# Patient Record
Sex: Male | Born: 1955
Health system: Southern US, Community
[De-identification: ages and names within clinical notes are randomized; demographics above are authoritative.]

## PROBLEM LIST (undated history)

## (undated) DIAGNOSIS — I10 Essential (primary) hypertension: Secondary | ICD-10-CM

## (undated) DIAGNOSIS — I2510 Atherosclerotic heart disease of native coronary artery without angina pectoris: Secondary | ICD-10-CM

## (undated) DIAGNOSIS — Z95810 Presence of automatic (implantable) cardiac defibrillator: Secondary | ICD-10-CM

## (undated) DIAGNOSIS — I428 Other cardiomyopathies: Secondary | ICD-10-CM

## (undated) DIAGNOSIS — I5022 Chronic systolic (congestive) heart failure: Principal | ICD-10-CM

## (undated) HISTORY — DX: Chronic systolic (congestive) heart failure: I50.22

## (undated) HISTORY — PX: EYE SURGERY: SHX253

## (undated) HISTORY — DX: Other cardiomyopathies: I42.8

## (undated) SURGERY — EXCISION, CHALAZION
Anesthesia: LOCAL | Laterality: Left

---

## 1999-03-05 ENCOUNTER — Emergency Department (HOSPITAL_COMMUNITY): Admission: EM | Admit: 1999-03-05 | Discharge: 1999-03-05 | Payer: Self-pay | Admitting: Emergency Medicine

## 1999-03-05 ENCOUNTER — Encounter: Payer: Self-pay | Admitting: Emergency Medicine

## 2000-03-22 ENCOUNTER — Encounter: Admission: RE | Admit: 2000-03-22 | Discharge: 2000-03-22 | Payer: Self-pay | Admitting: Internal Medicine

## 2004-11-27 ENCOUNTER — Ambulatory Visit (HOSPITAL_COMMUNITY): Admission: RE | Admit: 2004-11-27 | Discharge: 2004-11-27 | Payer: Self-pay | Admitting: Family Medicine

## 2004-11-27 ENCOUNTER — Emergency Department (HOSPITAL_COMMUNITY): Admission: EM | Admit: 2004-11-27 | Discharge: 2004-11-27 | Payer: Self-pay | Admitting: Family Medicine

## 2008-06-23 ENCOUNTER — Inpatient Hospital Stay (HOSPITAL_COMMUNITY): Admission: EM | Admit: 2008-06-23 | Discharge: 2008-06-26 | Payer: Self-pay | Admitting: Family Medicine

## 2008-06-24 ENCOUNTER — Encounter (INDEPENDENT_AMBULATORY_CARE_PROVIDER_SITE_OTHER): Payer: Self-pay | Admitting: *Deleted

## 2008-07-29 ENCOUNTER — Inpatient Hospital Stay (HOSPITAL_COMMUNITY): Admission: EM | Admit: 2008-07-29 | Discharge: 2008-07-30 | Payer: Self-pay | Admitting: Emergency Medicine

## 2009-02-10 IMAGING — CR DG CHEST 1V PORT
1 series · 1 of 1 positions shown · non-contrast
Comparison: 06/23/2008.

CLINICAL DATA: Chest pain.

PORTABLE CHEST - 1 VIEW

[view not recorded]
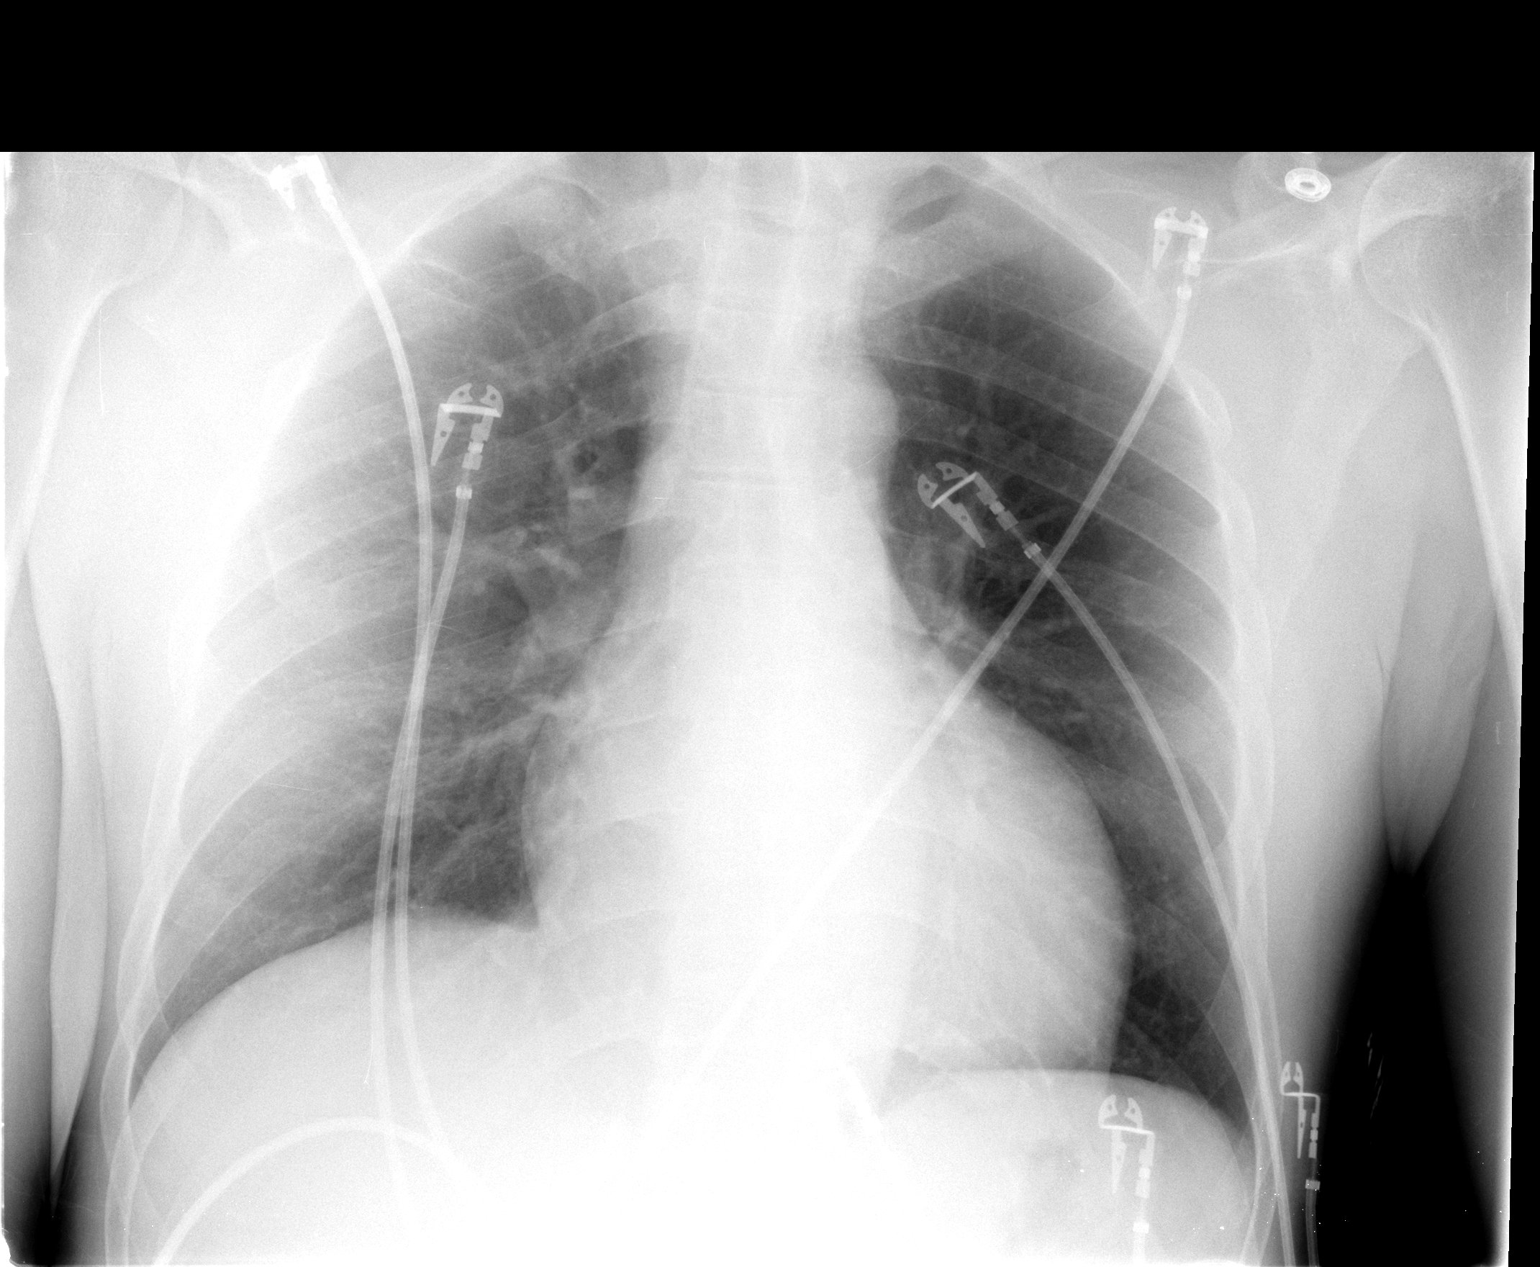

[1 of 1 positions shown; findings below may reference images not displayed]

FINDINGS: No infiltrate, congestive heart failure or pneumothorax.
Heart size slightly prominent which may be related to AP
magnification.  The CT detected right middle lobe pulmonary nodule
is not identified on the present plain film exam.  Please see CT
report of 06/24/2008 with regard to recommended follow-up.
IMPRESSION: No infiltrate or congestive heart failure.

Heart appears slightly enlarged which may be related to
magnification.

Right middle lobe nodule noted on the CT 06/24/2008 is not seen on
present plain film examination.  Please see report from such with
regard to followup recommendations.

## 2009-11-26 ENCOUNTER — Emergency Department (HOSPITAL_COMMUNITY): Admission: EM | Admit: 2009-11-26 | Discharge: 2009-11-26 | Payer: Self-pay | Admitting: Emergency Medicine

## 2010-12-20 LAB — POCT I-STAT, CHEM 8
BUN: 21 mg/dL (ref 6–23)
Chloride: 103 mEq/L (ref 96–112)
Creatinine, Ser: 1 mg/dL (ref 0.4–1.5)
Sodium: 139 mEq/L (ref 135–145)

## 2010-12-20 LAB — POCT CARDIAC MARKERS
CKMB, poc: <1 ng/mL — ABNORMAL LOW (ref 1.0–8.0)
Troponin i, poc: <0.05 ng/mL (ref 0.00–0.09)
Troponin i, poc: <0.05 ng/mL (ref 0.00–0.09)

## 2011-02-09 NOTE — Discharge Summary (Signed)
[name] [name], [name] [name]              ACCOUNT NO.:  [id number]   MEDICAL RECORD NO.:  [id number]          PATIENT TYPE:  INP   LOCATION:  2021                         FACILITY:  [facility]   PHYSICIAN:  Estelle Grumbles, MDDATE OF BIRTH:  [birth year]   DATE OF ADMISSION:  06/23/2008  DATE OF DISCHARGE:  06/26/2008                               DISCHARGE SUMMARY   PRIMARY CARE PHYSICIAN:  The patient does not have any primary care  provided.  We will ask the case manager to help the patient with  followup appointments.   DISCHARGE DIAGNOSES:  1. Chest pain with nonischemic cardiomyopathy.  2. Hypercholesterolemia.  3. Asymptomatic bradycardia.  4. Hypertension.  5. Cocaine use.  6. History of tobacco use.   DISCHARGE MEDICATIONS:  1. Enteric-coated aspirin 325 mg daily.  2. Lisinopril 10 mg daily.  3. Prilosec 20 mg daily.  4. Zocor 40 mg daily.   INPATIENT CONSULTANTS:  Cardiology.   INPATIENT PROCEDURE:  Coronary CT angiogram revealing calcifications and  moderate obstruction at the proximal and mid circumflex.  Angiogram  circumflex revealing calcifications with irregularities up to 40-50%,  nonischemic cardiomyopathy and nonobstructive coronary atherosclerosis.   HOSPITAL COURSE:  [name] presented to the emergency room with a  chest pain.  His urine drug screen was positive for cocaine.  The  patient was also found to have bradycardia up to 40-43.  He was further  evaluated by Cardiology.  The patient was asymptomatic with this  bradycardia.  No episodes of hypertension.  No evidence of any weakness,  dizziness, lightheadedness or any other symptoms secondary to  bradycardia.  He underwent coronary CT angiogram and subsequently left  heart catheterization with the results as mentioned earlier.  The  patient was started on ACE inhibitors and his lipid function test showed  elevated LDL level up to 191.  He was started on Zocor.  We will  discharge him home.  The patient  need to follow up with primary care  [name] in 1 week.  The patient also  can follow up at [facility] in 1 month and I have  explained him in detail about the symptoms of bradycardia, and I have  recommended him to follow up at any emergency room if he gets  symptomatic.  The patient understands and verbalizes back.      Estelle Grumbles, MD  Electronically Signed     TP/MEDQ  D:  06/26/2008  T:  06/27/2008  Job:  [id number]

## 2011-02-09 NOTE — H&P (Signed)
[name], [name]              ACCOUNT NO.:  [id number]   MEDICAL RECORD NO.:  [id number]          PATIENT TYPE:  INP   LOCATION:  3704                         FACILITY:  [facility]   PHYSICIAN:  [name] Bathe, MD      DATE OF BIRTH:  23-Sep-1956   DATE OF ADMISSION:  07/29/2008  DATE OF DISCHARGE:                              HISTORY & PHYSICAL   CHIEF COMPLAINT:  Chest pain.   HISTORY OF PRESENT ILLNESS:  [name] [name] is a 55 year old male patient  admitted in September with chest pain.  At that time, he did have a  urine drug screen that revealed cocaine.  Ultimately, he underwent  coronary CTA with coronary atherosclerosis documented.  For that reason,  he underwent cardiac catheterization.  He was found to have 40% mid LAD,  circumflex with a ostial lesion 60%, 60% distal circumflex lesion, and a  30% right coronary artery lesion.  Medical management.  He was also  found to have a nonischemic cardiomyopathy.  He had 40% and this was  managed medically.   Now, he has had 2 days of intermittent substernal chest pressure 6/10,  it sometimes gets better with deep breathing.  He awoke this morning  with substernal chest pain that lasted about 30 minutes and he got  scared and came to the emergency room.  He has no other associated  symptoms such as shortness of breath, diaphoresis, nausea, or vomiting.   PAST MEDICAL HISTORY:  Nonobstructive coronary artery disease, history  of cocaine of use, smoker, nonischemic cardiomyopathy EF 40%,  bradycardia, asymptomatic, and hypertension.  He has not been started on  beta-blocker secondary to his bradycardia and his cocaine use.   SOCIAL HISTORY:  He lives in [place].  He has 5 kids.  He is a  Curator.  He smokes less than half a pack per day.  No alcohol and  currently denies any illicit drug use.   FAMILY HISTORY:  Both parents have heart disease.   ALLERGIES:  No known drug allergies.   MEDICATIONS:  1. Enteric-coated aspirin  325 mg a day.  2. Lisinopril/hydrochlorothiazide 20/12.5 mg daily.  3. Prilosec daily.  4. Sublingual nitroglycerin p.r.n.  5. Zocor 40 mg a day.   REVIEW OF SYSTEMS:  Fatigue, otherwise all other 12 ROS negative.   PHYSICAL EXAMINATION:  VITAL SIGNS:  Temperature 98, pulse 78,  respirations 20, blood pressure 120/76, O2 saturation is 95% on room  air.  GENERAL:  He is in no acute distress.  HEENT:  Grossly normal.  No carotid or subclavian bruits.  No JVD or  thyromegaly.  Sclerae clear.  Conjunctiva normal.  Nares without  drainage.  CHEST:  Clear to auscultation bilaterally.  No wheezes or rhonchi.  HEART:  Regular rate and rhythm.  No rubs or murmur.  ABDOMEN:  Good bowel sounds, nontender, and nondistended.  No masses, no  bruits.  EXTREMITIES:  Lower extremity, no peripheral edema.  SKIN:  Warm and dry.  NEURO:  Cranial nerves II through XII grossly intact.  PSYCH:  Normal mood and affect.   CT of  the chest in September 2009 showed a right middle lobe nodule.  The nodules are not seen on plain film today.  The heart appears  slightly enlarged. Personally reviewed.   LABORATORY DATA:  Urine drug screen is negative.  Total CK 98 with the  MB fraction of 1.5, troponin 0.01, hemoglobin 17.8, hematocrit 53.3,  platelets 175, white count 4.4.  Sodium 136, potassium 4.0, BUN 14,  creatinine 0.94.   EKG shows T-wave inversion inferior laterally, the lateral changes are  new from June 24, 2008, normal sinus bradycardia rate 58.  LVH.   ASSESSMENT AND PLAN:  1. Atypical chest pain.  2. Nonobstructive coronary artery disease.  3. Nonischemic cardiomyopathy EF 40%.  4. Hypertension.  5. History of polysubstance abuse.  6. Asymptomatic bradycardia.   We will admit him to cycle his cardiac enzymes.  Since we cannot add a  beta-blocker due to his cocaine use and bradycardia, we will go ahead  and add a nitrate.  If the patient desires he will remain in the  hospital  overnight and if his enzymes are negative, we will discharge  him to home once he is seen by Dr. Katrinka Blazing in the morning.      Guy Franco, P.A.      [name] Bathe, MD  Electronically Signed    LB/MEDQ  D:  07/29/2008  T:  07/30/2008  Job:  [id number]   cc:   Lyn Records, M.D.

## 2011-02-09 NOTE — H&P (Signed)
[name] [name], [name] [name]              ACCOUNT NO.:  [id number]   MEDICAL RECORD NO.:  [id number]          PATIENT TYPE:  EMS   LOCATION:  MAJO                         FACILITY:  [facility]   PHYSICIAN:  Samara Snide, MDDATE OF BIRTH:  13-Feb-1956   DATE OF ADMISSION:  06/23/2008  DATE OF DISCHARGE:                              HISTORY & PHYSICAL   CHIEF COMPLAINT:  Chest pain.   HISTORY OF PRESENT ILLNESS:  The patient is a 55 year old African  American gentleman who presented to the emergency room with complaints  of chest pain.   The patient denies any prior history of hypertension, diabetes, or  hyperlipidemia.  Per the patient, he started having chest pain around  4:00 this morning and went to Urgent Care, emergently he was transferred  to the ER.   Chest pain is substernal, not radiating anywhere.  The patient reports  pain is a 7/10 to 8/10 in intensity, sharp pain.  Pain much greater at  present.  The patient admits to being a smoker, occasional alcohol.   Never had a primary care doctor before.  Denies any shortness of breath  at present.   When the patient presented to the emergency room, his blood pressure was  163/85, which came down to 124/66 after medications.   PAST MEDICAL HISTORY:  None, per the patient.  Denies any history of  hypertension, diabetes, hyperlipidemia.   PAST SURGICAL HISTORY:  None reported.   ALLERGIES:  No known drug allergies.   MEDICATIONS AT HOME:  None, per the patient.   SOCIAL HISTORY:  The patient is single, has 5 children.  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  Admits to smoking 1 pack per day.  Reports he is an occasionally alcoholic, over the weekend.  The patient  hesitant about the use of drugs.  The patient's daughter with the  patient present.   REVIEW OF SYSTEMS:  GENERAL:  The patient reports doing better.  CHEST  AND RESPIRATORY:  Denies any cough or shortness of breath.  No recent  fevers.  CARDIAC:   Complains of chest pain, worse this morning, much  better at present.  GASTROINTESTINAL:  Negative.  MUSCULOSKELETAL:  Denies any joint pain.   PHYSICAL EXAMINATION:  VITAL SIGNS:  At present, blood pressure 124/66,  O2 saturation 96% on 2L oxygen, pulse 50, and respirations 18.  GENERAL APPEARANCE:  Middle-aged [place] American gentleman lying in bed  in no acute distress.  EYES:  Sclerae anicteric.  No pallor.  Pupils normal.  CHEST AND RESPIRATORY:  Lungs are clear.  CARDIOVASCULAR:  Cardiac S1, S2.  The patient noted to be bradycardic.  ABDOMEN:  Soft, bowel sounds present.  Nontender on palpation.  NECK:  Supple.  No JVD on examination.  MUSCULOSKELETAL:  Warm 4 extremities.  NEUROLOGIC:  Awake, alert, and oriented x3.  No focalities noted.  EXTREMITIES:  No edema, clubbing noted.   LABORATORY DATA:  On admission WBC 5, hemoglobin 18, platelet count  162,000.  Sodium 141, potassium 4.2, BUN 12, creatinine 1.  Alkaline  phosphatase 141, SGOT 28, SGPT 19,  calcium 9.5, CK-MB less than 1,  troponin less than 0.05.  BNP 40.  Urine drug screen came positive for  cocaine.  Urinalysis nitrate negative, small, bacteria rare.  Specific  gravity 1.02.  EKG reviewed.  Sinus bradycardia at 52 per minute.  Left  axis deviation.  Flipped T waves noted in II, III, and aVF.   ASSESSMENT AND PLAN:  1. Chest pain.  Rule out acute coronary syndrome.  Will admit the      patient to telemetry.  Will cycle cardiac enzymes every 8 hours.      Also, will get EKG every 8 hours.  Start aspirin 325 mg p.o. daily.      Sublingual nitro.  Continue oxygen via nasal cannula.  2. Cocaine abuse.  The patient's urine drug screen came positive for      cocaine, probably contributing to his chest pain.  Counseled the      patient on drug cessation.  Will monitor cardiac enzymes q.8 hours.  3. Bradycardia, the patient noted to be bradycardic with a heart rate      in the 50s.  Will get cardiology to evaluate the  patient.  Also,      would get atropine bedside for heart rate less than 40.  4. Will start DVT prophylaxis with Lovenox.  Also will start GI      prophylaxis with Protonix.  Also will IV hydrate the patient      secondary to history of drug abuse.  Also will do a 2D echo on the      patient to evaluate LV ejection fraction.  5. Uncontrolled hypertension, probably related to cocaine abuse.  The      patient's blood pressure improved at present, currently 124/66.      Will monitor blood pressure.      Samara Snide, MD  Electronically Signed     SAS/MEDQ  D:  06/23/2008  T:  06/23/2008  Job:  [id number]

## 2011-02-09 NOTE — Consult Note (Signed)
NAMEGERSHON, SHORTEN NO.:  [id number]   MEDICAL RECORD NO.:  [id number]          PATIENT TYPE:  INP   LOCATION:  2021                         FACILITY:  [facility]   PHYSICIAN:  Lyn Records, M.D.   DATE OF BIRTH:  05-Jun-1956   DATE OF CONSULTATION:  06/23/2008  DATE OF DISCHARGE:                                 CONSULTATION   [facility] counseled the patient that we have been asked to consult on.   INDICATIONS:  Bradycardia and chest pain.   CONCLUSIONS:  1. Sinus bradycardia, asymptomatic, cause is uncertain, but may not      represent a problem.  Rule out sick sinus syndrome.  2. Chest pain, substernal pressure.  The patient has positive cocaine      in his drug screen.  This may be secondary to cocaine-induced      ischemia.  It could be noncardiac or other.  3. Abnormal EKG with bradycardia and QS pattern, V1 through V2.   RECOMMENDATIONS:  1. TSH, rule out hypothyroidism, and may be contributing to      bradycardia.  2. Aspirin.  3. Serial EKGs and markers to rule out MI.  4. Telemetry to rule out symptomatic bradyarrhythmias.  5. Coronary CT angio in a.m. to rule out obstructive coronary artery      disease and to help ascertain the source of the patient's chest      pain.   COMMENTS:  The patient is 55 years of age.  He states that he used to  use illicit drugs in the remote past.  He does not use them recently,  although his drug screen is positive for cocaine.  He [facility] this [facility]  to [facility] and was shifted to [facility] after he was  noted to have persistent bradycardia in the 50s and lingering substernal  discomfort.  He described the discomfort as a pressure-like sensation.  The discomfort did not radiate.  It did not go to his back.  He denied  difficulty swallowing.  No neck discomfort with it.  There was no  dyspnea or diaphoresis.   SIGNIFICANT PAST MEDICAL PROBLEMS:  None.   PAST SURGICAL HISTORY:  None.   ALLERGIES:   None.   MEDICATIONS:  None.   SOCIAL HISTORY:  The patient is single.  He works as a Curator.  He  smokes greater than 1 pack per day for greater than 30 years.  He drinks  alcohol.  He will not admit to using cocaine despite a positive drug  screen.   PHYSICAL EXAMINATION:  GENERAL:  The patient is in no acute distress.  VITAL SIGNS:  His blood pressure is 160/80 and heart rate 55.  NECK:  No JVD or carotid bruits.  Thyroid is not palpable.  LUNGS:  Clear to auscultation and percussion.  CARDIAC:  No gallop.  No rub.  No click.  No murmur.  ABDOMEN:  Soft.  EXTREMITIES:  No edema.  Dorsalis pedis pulses 2+ bilaterally.  Radial  pulses 2+ bilaterally.  NEUROLOGIC:  Unremarkable.   Chest x-ray is unremarkable.  EKG  reveals sinus bradycardia, 52 beats  per minute.  QS pattern V1 and V2 with leftward axis and T-wave  abnormality in II, III, and aVF.   DISCUSSION:  The patient's history is somewhat atypical.  In favor of  ACS, is a smoking history, age, and abnormal resting EKG.  The absence  of cardiac markers and evolutionary EKG changes are against ACS.  The  patient's symptoms could be related to a cocaine-induced ischemia.  The  patient's drug screen is positive for cocaine.  Bradycardia may be  significant, although my sense is that he has chronic bradycardia.  He  has no symptoms to suggest sinus node dysfunction, although further  monitoring will help [place] to determine whether the bradycardia is  significant or not.      Lyn Records, M.D.  Electronically Signed     HWS/MEDQ  D:  06/23/2008  T:  06/24/2008  Job:  [id number]

## 2011-02-09 NOTE — Discharge Summary (Signed)
NAMEEULOGIO, REQUENA              ACCOUNT NO.:  [id number]   MEDICAL RECORD NO.:  [id number]          PATIENT TYPE:  INP   LOCATION:  3704                         FACILITY:  [facility]   PHYSICIAN:  Guy Franco, P.A.       DATE OF BIRTH:  [birth year]   DATE OF ADMISSION:  07/29/2008  DATE OF DISCHARGE:  07/30/2008                               DISCHARGE SUMMARY   DISCHARGE DIAGNOSES:  1. Atypical chest pain, resolved.  2. Nonischemic cardiomyopathy, ejection fraction 40%.  3. Urinary tract infection, treated.  4. Nonobstructive coronary artery disease.  5. History of polysubstance abuse.  6. Asymptomatic bradycardia, therefore we cannot use beta-blockers.  7. Hypertension.   HOSPITAL COURSE:  Shonta Phillis is a 55 year old male patient who is  admitted initially in September 2009 with chest pain.  At that time, he  tested positive for cocaine.  He ultimately underwent a coronary CTA  which revealed coronary atherosclerosis and then he underwent cardiac  catheterization.  This showed a 40% mid LAD, ostial OM-1, 60% distal  circumflex, and 30% right coronary artery lesion.  His EF was calculated  at 40% with diffuse hyperkinesis and he was medically managed.   He was admitted on July 29, 2008, with pressure, quite, and this  awakened him on the day of admission.  It lasted about 30 minutes and he  got scared and came to the emergency room.  He denies any shortness of  breath, diaphoresis, nausea, or vomiting.   PAST MEDICAL HISTORY:  1. Nonobstructive coronary artery disease.  2. History of cocaine use.  3. Nonischemic cardiomyopathy, EF 40%.  4. Bradycardia.  5. Hypertension.  6. Tobacco abuse.  7. Smoking cessation counseling.   HOSPITAL COURSE:  While in the hospital, the patient ruled out for  myocardial infarction and his lab work was concerning for urinary tract  infection.  He did have a small bit of leukocyte esterase and a small  number of white blood cells.  This was  treated with Cipro for 3 days.  Because we cannot use a beta-blocker in this patient because of his  cocaine use and bradycardia, we did add Imdur to his medical regimen.  He remained in the hospital overnight and remained in the stable  condition and so he was sent to the home the following day.   DISCHARGE MEDICATIONS:  1. Cipro 250 mg a day for 2 more days.  2. Imdur 30 mg a day.  3. Enteric coated aspirin 325 mg a day.  4. Lisinopril/HCTZ 20/12.5 mg daily.  5. Sublingual nitroglycerine p.r.n. chest pain.  6. Zocor 40 mg a day.   DISCHARGE INSTRUCTIONS:  The patient is to remain on low-sodium heart  healthy diet, increase activity slowly.  Follow with Dr. Effie Shy, nurse practitioner on August 14, 2008 at 11:10 a.m.      Guy Franco, P.A.     LB/MEDQ  D:  07/30/2008  T:  07/30/2008  Job:  [id number]

## 2011-02-09 NOTE — Cardiovascular Report (Signed)
NAMEFOSTER, FRERICKS NO.:  [id number]   MEDICAL RECORD NO.:  [id number]         PATIENT TYPE:  CINP   LOCATION:                               FACILITY:  [facility]   PHYSICIAN:  Lyn Records, M.D.   DATE OF BIRTH:  05-17-1956   DATE OF PROCEDURE:  06/24/2008  DATE OF DISCHARGE:                            CARDIAC CATHETERIZATION   INDICATION FOR THE STUDY:  Elevated coronary calcium score by CTA and  moderate circumflex disease.  The study is being done to rule out  significant coronary obstruction.   PROCEDURES PERFORMED:  1. Left heart catheterization.  2. Selective coronary angiography.  3. Left ventriculography.   DESCRIPTION:  After 1 mg of Versed and 1% Xylocaine local infiltration,  left heart catheterization was performed with a 6-French A2 multipurpose  catheter.  A 6-French sheath was placed in the right femoral artery  using the modified Seldinger technique.  A2 multipurpose catheter was  used for left ventriculography, hemodynamic recordings, and left  coronary angiography.  A #4 right Judkins 6-French catheter was used for  right coronary angiography.  The patient tolerated the procedure without  complications.   RESULTS:  1. Hemodynamic data:  1a.  Aortic pressure 141/67.  1b.  Left ventricular pressure 143/20 mmHg.  1. Left ventriculography:  Left ventricular cavity, size at upper      limit were normal in size.  There is global hypokinesis with an EF      in the 40% range.  No mitral regurgitation is noted.  2. Coronary angiography.  3a.  Left main coronary:  The left main contains flecks of calcium.  Irregularity is noted.  No significant obstruction is noted.  3b.  Left anterior descending coronary:  LAD is large and is  transapical.  Irregularities that obstruct the LAD by up to 40% are  present in the mid vessel just beyond the first diagonal and first  septal perforator.  The region of obstruction is mild and not  significant.  The  first diagonal is large and free of obstruction.  3c.  Circumflex artery:  Circumflex coronary artery is calcified  proximally in particular.  Luminal irregularities are noted.  The second  obtuse marginal is a moderate-sized vessel that contains ostial 50-60%  narrowing.  The circumflex within this region contains calcification and  eccentric narrowing up to 50%.  The first obtuse marginal bifurcates and  is large.  The circumflex terminates on the small third obtuse marginal.  There is 50-60% eccentric narrowing in the distal circumflex.  No high-  grade obstruction is felt be present in the circumflex system.  3d.  Right coronary:  The right coronary is widely patent.  The  midvessel contains irregularities with up to 30% narrowing before the  first acute marginal branch.   CONCLUSIONS:  1. Mild-to-moderate coronary atherosclerosis with less than 50%      stenosis in the mid-left anterior descending, the proximal      circumflex and the mid-right coronary artery.  2. Left ventricular dysfunction out of proportion to the patient's      coronary disease  suggesting a nonischemic cardiomyopathy.   RECOMMENDATIONS:  1. ACE inhibitor therapy.  2. Probably not a good candidate for beta-blocker therapy because of      resting significant bradycardia.  3. Needs clinical follow up.  4. Needs management of hypertension if it exists, although in the      hospital his blood pressures have been under reasonably good      control.      Lyn Records, M.D.  Electronically Signed     HWS/MEDQ  D:  06/25/2008  T:  06/26/2008  Job:  [id number]

## 2011-06-28 LAB — CK TOTAL AND CKMB (NOT AT ARMC): Total CK: 119

## 2011-06-28 LAB — CBC
HCT: 54.8 — ABNORMAL HIGH
MCHC: 33.4
MCV: 92.5
Platelets: 165
RBC: 5.19
RBC: 5.79
RDW: 14
WBC: 5

## 2011-06-28 LAB — URINALYSIS, ROUTINE W REFLEX MICROSCOPIC
Nitrite: NEGATIVE
Protein, ur: NEGATIVE
Specific Gravity, Urine: 1.028
Urobilinogen, UA: 1
pH: 6

## 2011-06-28 LAB — LIPID PANEL
Cholesterol: 238 — ABNORMAL HIGH
LDL Cholesterol: 191 — ABNORMAL HIGH

## 2011-06-28 LAB — COMPREHENSIVE METABOLIC PANEL
ALT: 19
AST: 28
Albumin: 4
BUN: 12
CO2: 29
Potassium: 4.2
Total Bilirubin: 1

## 2011-06-28 LAB — DIFFERENTIAL
Basophils Absolute: 0
Basophils Relative: 0
Eosinophils Absolute: 0
Eosinophils Relative: 0
Lymphocytes Relative: 23
Lymphs Abs: 1.2
Monocytes Relative: 8

## 2011-06-28 LAB — PHOSPHORUS: Phosphorus: 2.5

## 2011-06-28 LAB — POCT CARDIAC MARKERS
CKMB, poc: <1 — ABNORMAL LOW
CKMB, poc: <1 — ABNORMAL LOW
Myoglobin, poc: 175
Troponin i, poc: <0.05

## 2011-06-28 LAB — URINE MICROSCOPIC-ADD ON

## 2011-06-28 LAB — BASIC METABOLIC PANEL
BUN: 7
CO2: 24
Calcium: 8.9
Chloride: 106
Creatinine, Ser: 0.98
Creatinine, Ser: 1.01
GFR calc Af Amer: >60
GFR calc Af Amer: >60
GFR calc non Af Amer: >60
Potassium: 3.4 — ABNORMAL LOW

## 2011-06-28 LAB — CARDIAC PANEL(CRET KIN+CKTOT+MB+TROPI)
CK, MB: 0.9
CK, MB: 0.9
Total CK: 76
Troponin I: 0.01

## 2011-06-28 LAB — HEMOGLOBIN A1C: Mean Plasma Glucose: 123

## 2011-06-28 LAB — RAPID URINE DRUG SCREEN, HOSP PERFORMED
Amphetamines: NOT DETECTED
Benzodiazepines: NOT DETECTED
Tetrahydrocannabinol: NOT DETECTED

## 2011-06-28 LAB — GLUCOSE, CAPILLARY: Glucose-Capillary: 105 — ABNORMAL HIGH

## 2011-06-28 LAB — B-NATRIURETIC PEPTIDE (CONVERTED LAB): Pro B Natriuretic peptide (BNP): 40

## 2011-06-28 LAB — APTT
aPTT: 33
aPTT: 35

## 2011-06-29 LAB — CARDIAC PANEL(CRET KIN+CKTOT+MB+TROPI)
CK, MB: 0.9
Relative Index: INVALID
Total CK: 79

## 2011-06-29 LAB — RAPID URINE DRUG SCREEN, HOSP PERFORMED
Amphetamines: NOT DETECTED
Barbiturates: NOT DETECTED
Benzodiazepines: NOT DETECTED
Cocaine: NOT DETECTED
Opiates: NOT DETECTED

## 2011-06-29 LAB — DIFFERENTIAL
Basophils Absolute: 0
Basophils Relative: 0
Monocytes Absolute: 0.4
Neutro Abs: 2.2
Neutrophils Relative %: 50

## 2011-06-29 LAB — URINALYSIS, ROUTINE W REFLEX MICROSCOPIC
Hgb urine dipstick: NEGATIVE
Protein, ur: NEGATIVE
Urobilinogen, UA: 1

## 2011-06-29 LAB — CBC
HCT: 51
Hemoglobin: 17.2 — ABNORMAL HIGH
MCHC: 33.3
MCHC: 33.7
MCV: 93.5
RBC: 5.72
RDW: 14
WBC: 4.4

## 2011-06-29 LAB — BASIC METABOLIC PANEL
CO2: 27
Calcium: 9.6
Chloride: 101
Creatinine, Ser: 0.94
GFR calc non Af Amer: >60
Potassium: 4
Sodium: 136

## 2011-06-29 LAB — CK TOTAL AND CKMB (NOT AT ARMC)
Relative Index: INVALID
Total CK: 90
Total CK: 97

## 2011-06-29 LAB — URINE MICROSCOPIC-ADD ON

## 2011-10-26 ENCOUNTER — Ambulatory Visit (HOSPITAL_COMMUNITY)
Admission: RE | Admit: 2011-10-26 | Discharge: 2011-10-26 | Disposition: A | Discharge status: Home / Self Care | Payer: Self-pay | Source: Ambulatory Visit | Attending: Nurse Practitioner | Admitting: Nurse Practitioner

## 2011-10-26 ENCOUNTER — Other Ambulatory Visit (HOSPITAL_COMMUNITY): Payer: Self-pay | Admitting: Nurse Practitioner

## 2011-10-26 DIAGNOSIS — R079 Chest pain, unspecified: Secondary | ICD-10-CM | POA: Insufficient documentation

## 2011-10-26 DIAGNOSIS — R0602 Shortness of breath: Secondary | ICD-10-CM | POA: Insufficient documentation

## 2013-03-15 ENCOUNTER — Encounter (HOSPITAL_COMMUNITY): Payer: Self-pay | Admitting: *Deleted

## 2013-03-15 ENCOUNTER — Emergency Department (HOSPITAL_COMMUNITY)
Admission: EM | Admit: 2013-03-15 | Discharge: 2013-03-15 | Disposition: A | Discharge status: Home / Self Care | Payer: BC Managed Care – PPO | Source: Home / Self Care | Attending: Emergency Medicine | Admitting: Emergency Medicine

## 2013-03-15 DIAGNOSIS — D45 Polycythemia vera: Secondary | ICD-10-CM

## 2013-03-15 DIAGNOSIS — D751 Secondary polycythemia: Secondary | ICD-10-CM

## 2013-03-15 HISTORY — DX: Atherosclerotic heart disease of native coronary artery without angina pectoris: I25.10

## 2013-03-15 HISTORY — DX: Essential (primary) hypertension: I10

## 2013-03-15 LAB — POCT I-STAT, CHEM 8
HCT: 60 % — ABNORMAL HIGH (ref 39.0–52.0)
Hemoglobin: 20.4 g/dL — ABNORMAL HIGH (ref 13.0–17.0)
Potassium: 3.9 mEq/L (ref 3.5–5.1)
Sodium: 140 mEq/L (ref 135–145)
TCO2: 25 mmol/L (ref 0–100)

## 2013-03-15 MED ORDER — HYDROCHLOROTHIAZIDE 25 MG PO TABS: 25.0000 mg | ORAL_TABLET | Freq: Every day | ORAL | Status: DC

## 2013-03-15 NOTE — ED Provider Notes (Signed)
History     CSN: [id number]  Arrival date & time 03/15/13  1229   First MD Initiated Contact with Patient 03/15/13 1326      Chief Complaint  Patient presents with  . Dizziness    (Consider location/radiation/quality/duration/timing/severity/associated sxs/prior treatment) HPI Comments: Patient presents urgent care complaining of a few days of feeling lightheaded of a mile headaches and feeling dizzy. Patient denies any chest pain shortness of breath or cough. Describes that he use to go to the walk clinic. Patient denies any head injury or trauma, visual changes, weakness or paresthesias was upper lower extremities. He feels his blood pressure is up and has been many months since he is not taking his blood pressure medicines. He does not recall when he use to take.  The history is provided by the patient.    Past Medical History  Diagnosis Date  . Hypertension   . Coronary artery disease     History reviewed. No pertinent past surgical history.  History reviewed. No pertinent family history.  History  Substance Use Topics  . Smoking status: Current Every Day Smoker  . Smokeless tobacco: Not on file  . Alcohol Use: Yes      Review of Systems  Constitutional: Negative for fever, chills, diaphoresis, activity change and appetite change.  HENT: Negative for neck pain.   Eyes: Negative for visual disturbance.  Skin: Negative for color change, pallor, rash and wound.  Neurological: Positive for dizziness and headaches. Negative for seizures, facial asymmetry, weakness and numbness.    Allergies  Review of patient's allergies indicates no known allergies.  Home Medications   Current Outpatient Rx  Name  Route  Sig  Dispense  Refill  . hydrochlorothiazide (HYDRODIURIL) 25 MG tablet   Oral   Take 1 tablet (25 mg total) by mouth daily.   30 tablet   0     BP 145/98  Pulse 75  Temp(Src) 98 F (36.7 C) (Oral)  Resp 16  SpO2 97%  Physical Exam  Nursing note  and vitals reviewed. Constitutional: He is oriented to person, place, and time. Vital signs are normal. He appears well-developed and well-nourished.  Non-toxic appearance. He does not have a sickly appearance. He does not appear ill. No distress.  HENT:  Head: Normocephalic.  Mouth/Throat: No oropharyngeal exudate.  Eyes: Conjunctivae are normal. No scleral icterus.  Neck: Neck supple. No JVD present.  Pulmonary/Chest: Effort normal and breath sounds normal.  Abdominal: Soft.  Neurological: He is alert and oriented to person, place, and time. He has normal strength. No cranial nerve deficit or sensory deficit. He displays a negative Romberg sign.  Skin: No rash noted. No erythema.    ED Course  Procedures (including critical care time)  Labs Reviewed  POCT I-STAT, CHEM 8 - Abnormal; Notable for the following:    Hemoglobin 20.4 (*)    HCT 60.0 (*)    All other components within normal limits   No results found.   1. Polycythemia       MDM  Problem #1 Hypertension- patient of medicines, non-adherence to treatment. Currently describing that there are no doctors at the primary care clinic that he used to attend.   Problem #2 elevated H&H, although this could be related to smoking or chronic dehydration have recommended patient to have further workup and repeated labs to confirm a potential presence of polycythemia. Have explained to patient that this ultimate diagnosis puts him at high risk of having cerebrovascular accident such as  strokes and heart attacks. Have discussed with patient was symptoms should round his immediate evaluation in the emergency department. Patient agrees and understands treatment plan and the importance and necessary continuity of care followup. To complete the workup   Patient had been restarted on hydrochlorothiazide. Discharge instructions provided in writing the patient with information about polycythemia as well as smoking cessation recommendation  with verbal and written information of risks associated with smoking       Jimmie Molly, MD 03/15/13 1426

## 2013-03-15 NOTE — ED Notes (Signed)
Pt  Reports  Symptoms  Of  dizzyness     And  Lightheaded    Today   He  denys  Any  Pain   He  denys  Passing out  He  Reports  A  History of  htn       Non  Compliant  With meds

## 2013-04-16 ENCOUNTER — Ambulatory Visit: Payer: BC Managed Care – PPO | Attending: Family Medicine | Admitting: Internal Medicine

## 2013-04-16 VITALS — BP 165/93 | HR 81 | Temp 98.8°F | Resp 18 | Ht 73.0 in | Wt 166.0 lb

## 2013-04-16 DIAGNOSIS — Z79899 Other long term (current) drug therapy: Secondary | ICD-10-CM | POA: Insufficient documentation

## 2013-04-16 DIAGNOSIS — D751 Secondary polycythemia: Secondary | ICD-10-CM | POA: Insufficient documentation

## 2013-04-16 DIAGNOSIS — F17200 Nicotine dependence, unspecified, uncomplicated: Secondary | ICD-10-CM | POA: Insufficient documentation

## 2013-04-16 DIAGNOSIS — I10 Essential (primary) hypertension: Secondary | ICD-10-CM | POA: Insufficient documentation

## 2013-04-16 DIAGNOSIS — I2510 Atherosclerotic heart disease of native coronary artery without angina pectoris: Secondary | ICD-10-CM | POA: Insufficient documentation

## 2013-04-16 LAB — LIPID PANEL
HDL: 53 mg/dL (ref >39)
LDL Cholesterol: 171 mg/dL — ABNORMAL HIGH (ref 0–99)

## 2013-04-16 MED ORDER — TRAMADOL HCL 50 MG PO TABS: 50.0000 mg | ORAL_TABLET | Freq: Three times a day (TID) | ORAL | Status: DC | PRN

## 2013-04-16 NOTE — Progress Notes (Signed)
Patient ID: [name] [name], male   DOB: [birth year], 57 y.o.   MRN: [id number]  CC:  HPI: 57 year old male with a history of hypertension currently on hydrochlorothiazide 25 mg a day who presents for routine followup. He states that he has not had any headaches or dizziness in the last several days. Patient is compliant with his HCTZ but unfortunately continues to smoke. He has no interest in quitting at this time is declining a nicotine patch he denies any chest pain shortness of breath orthopnea paroxysmal nocturnal dyspnea or dependent edema. No Known Allergies Past Medical History  Diagnosis Date  . Hypertension   . Coronary artery disease    Current Outpatient Prescriptions on File Prior to Visit  Medication Sig Dispense Refill  . hydrochlorothiazide (HYDRODIURIL) 25 MG tablet Take 1 tablet (25 mg total) by mouth daily.  30 tablet  0   No current facility-administered medications on file prior to visit.   History reviewed. No pertinent family history. History   Social History  . Marital Status: Single    Spouse Name: N/A    Number of Children: N/A  . Years of Education: N/A   Occupational History  . Not on file.   Social History Main Topics  . Smoking status: Current Every Day Smoker  . Smokeless tobacco: Not on file  . Alcohol Use: Yes  . Drug Use: Not on file  . Sexually Active: Not on file   Other Topics Concern  . Not on file   Social History Narrative  . No narrative on file    Review of Systems  Constitutional: Negative for fever, chills, diaphoresis, activity change, appetite change and fatigue.  HENT: Negative for ear pain, nosebleeds, congestion, facial swelling, rhinorrhea, neck pain, neck stiffness and ear discharge.   Eyes: Negative for pain, discharge, redness, itching and visual disturbance.  Respiratory: Negative for cough, choking, chest tightness, shortness of breath, wheezing and stridor.   Cardiovascular: Negative for chest pain, palpitations and  leg swelling.  Gastrointestinal: Negative for abdominal distention.  Genitourinary: Negative for dysuria, urgency, frequency, hematuria, flank pain, decreased urine volume, difficulty urinating and dyspareunia.  Musculoskeletal: Negative for back pain, joint swelling, arthralgias and gait problem.  Neurological: Negative for dizziness, tremors, seizures, syncope, facial asymmetry, speech difficulty, weakness, light-headedness, numbness and headaches.  Hematological: Negative for adenopathy. Does not bruise/bleed easily.  Psychiatric/Behavioral: Negative for hallucinations, behavioral problems, confusion, dysphoric mood, decreased concentration and agitation.    Objective:   Filed Vitals:   04/16/13 1701  BP: 165/93  Pulse: 81  Temp: 98.8 F (37.1 C)  Resp: 18    Physical Exam  Constitutional: Appears well-developed and well-nourished. No distress.  HENT: Normocephalic. External right and left ear normal. Oropharynx is clear and moist.  Eyes: Conjunctivae and EOM are normal. PERRLA, no scleral icterus.  Neck: Normal ROM. Neck supple. No JVD. No tracheal deviation. No thyromegaly.  CVS: RRR, S1/S2 +, no murmurs, no gallops, no carotid bruit.  Pulmonary: Effort and breath sounds normal, no stridor, rhonchi, wheezes, rales.  Abdominal: Soft. BS +,  no distension, tenderness, rebound or guarding.  Musculoskeletal: Normal range of motion. No edema and no tenderness.  Lymphadenopathy: No lymphadenopathy noted, cervical, inguinal. Neuro: Alert. Normal reflexes, muscle tone coordination. No cranial nerve deficit. Skin: Skin is warm and dry. No rash noted. Not diaphoretic. No erythema. No pallor.  Psychiatric: Normal mood and affect. Behavior, judgment, thought content normal.   Lab Results  Component Value Date   WBC 4.1 07/29/2008  HGB 20.4* 03/15/2013   HCT 60.0* 03/15/2013   MCV 93.5 07/29/2008   PLT 172 07/29/2008   Lab Results  Component Value Date   CREATININE 1.10 03/15/2013    BUN 13 03/15/2013   NA 140 03/15/2013   K 3.9 03/15/2013   CL 105 03/15/2013   CO2 27 07/29/2008    Lab Results  Component Value Date   HGBA1C  Value: 5.9 (NOTE)   The ADA recommends the following therapeutic goal for glycemic   control related to Hgb A1C measurement:   Goal of Therapy:   < 7.0% Hgb A1C   Reference: American Diabetes Association: Clinical Practice   Recommendations 2008, Diabetes Care,  2008, 31:(Suppl 1). 06/23/2008   Lipid Panel     Component Value Date/Time   CHOL  Value: 238        ATP III CLASSIFICATION:  <200     mg/dL   Desirable  409-811  mg/dL   Borderline High  >=914    mg/dL   High* 7/82/9562 1308   TRIG 95 06/26/2008 0325   HDL 28* 06/26/2008 0325   CHOLHDL 8.5 06/26/2008 0325   VLDL 19 06/26/2008 0325   LDLCALC  Value: 191        Total Cholesterol/HDL:CHD Risk Coronary Heart Disease Risk Table                     Men   Women  1/2 Average Risk   3.4   3.3* 06/26/2008 0325       Assessment and plan:   There are no active problems to display for this patient.  Hypertension Continue hydrochlorothiazide  Secondary polycythemia Likely related to smoking Repeat CBC  Routine healthcare maintenance next I will check her lipid panel  Followup in 2 months for routine followup

## 2013-04-16 NOTE — Progress Notes (Signed)
Patient presents to establish care; was seen at urgent care for dizziness and given hypertensive Rx; denies any dizziness and chest pain since starting HCTZ.

## 2013-04-17 LAB — CBC WITH DIFFERENTIAL/PLATELET
Basophils Absolute: 0 10*3/uL (ref 0.0–0.1)
Eosinophils Absolute: 0.1 10*3/uL (ref 0.0–0.7)
Eosinophils Relative: 1 % (ref 0–5)
Lymphocytes Relative: 24 % (ref 12–46)
Lymphs Abs: 1.2 10*3/uL (ref 0.7–4.0)
Neutrophils Relative %: 64 % (ref 43–77)
Platelets: 207 10*3/uL (ref 150–400)
RBC: 5.19 MIL/uL (ref 4.22–5.81)
RDW: 14.4 % (ref 11.5–15.5)
WBC: 5.3 10*3/uL (ref 4.0–10.5)

## 2013-04-17 LAB — ERYTHROPOIETIN: Erythropoietin: 18.2 m[IU]/mL (ref 2.6–18.5)

## 2013-04-18 ENCOUNTER — Other Ambulatory Visit: Payer: Self-pay | Admitting: Internal Medicine

## 2013-04-18 ENCOUNTER — Telehealth: Payer: Self-pay | Admitting: Family Medicine

## 2013-04-18 MED ORDER — HYDROCHLOROTHIAZIDE 25 MG PO TABS: 25.0000 mg | ORAL_TABLET | Freq: Every day | ORAL | Status: DC

## 2013-04-18 NOTE — Telephone Encounter (Signed)
Pt presented to clinic asking for a prescription of his blood pressure medication.  Gave him written prescription and refills today.    [name] Langton, MD, CDE, FAAFP [facility] [place], [place]

## 2013-04-18 NOTE — Telephone Encounter (Signed)
Pt was here 04/16/13 and says he discussed Tramadol refill and bp meds with Dr but only received bp meds and not pain meds.  Please f/u with pt.

## 2013-06-18 ENCOUNTER — Ambulatory Visit: Payer: BC Managed Care – PPO | Attending: Internal Medicine | Admitting: Internal Medicine

## 2013-06-18 ENCOUNTER — Encounter: Payer: Self-pay | Admitting: Internal Medicine

## 2013-06-18 VITALS — BP 166/89 | HR 61 | Temp 98.7°F | Resp 18 | Ht 73.0 in | Wt 166.0 lb

## 2013-06-18 DIAGNOSIS — Z09 Encounter for follow-up examination after completed treatment for conditions other than malignant neoplasm: Secondary | ICD-10-CM | POA: Insufficient documentation

## 2013-06-18 DIAGNOSIS — M549 Dorsalgia, unspecified: Secondary | ICD-10-CM | POA: Insufficient documentation

## 2013-06-18 DIAGNOSIS — D751 Secondary polycythemia: Secondary | ICD-10-CM

## 2013-06-18 DIAGNOSIS — I2510 Atherosclerotic heart disease of native coronary artery without angina pectoris: Secondary | ICD-10-CM | POA: Insufficient documentation

## 2013-06-18 DIAGNOSIS — I499 Cardiac arrhythmia, unspecified: Secondary | ICD-10-CM

## 2013-06-18 DIAGNOSIS — E785 Hyperlipidemia, unspecified: Secondary | ICD-10-CM

## 2013-06-18 DIAGNOSIS — I10 Essential (primary) hypertension: Secondary | ICD-10-CM

## 2013-06-18 DIAGNOSIS — Z79899 Other long term (current) drug therapy: Secondary | ICD-10-CM | POA: Insufficient documentation

## 2013-06-18 DIAGNOSIS — M545 Low back pain, unspecified: Secondary | ICD-10-CM

## 2013-06-18 DIAGNOSIS — F17200 Nicotine dependence, unspecified, uncomplicated: Secondary | ICD-10-CM

## 2013-06-18 MED ORDER — HYDROCHLOROTHIAZIDE 25 MG PO TABS: 25.0000 mg | ORAL_TABLET | Freq: Every day | ORAL | Status: DC

## 2013-06-18 MED ORDER — AMLODIPINE BESYLATE 5 MG PO TABS: 5.0000 mg | ORAL_TABLET | Freq: Every day | ORAL | Status: DC

## 2013-06-18 MED ORDER — ROSUVASTATIN CALCIUM 20 MG PO TABS: 20.0000 mg | ORAL_TABLET | Freq: Every day | ORAL | Status: DC

## 2013-06-18 MED ORDER — ACETAMINOPHEN-CODEINE #3 300-30 MG PO TABS: 1.0000 | ORAL_TABLET | ORAL | Status: DC | PRN

## 2013-06-18 NOTE — Progress Notes (Signed)
Patient ID: Armand Preast, male   DOB: 08-Jul-1956, 57 y.o.   MRN: [id number] Patient Demographics  Ayub Kirsh, is a 57 y.o. male  [id number]  [id number]  DOB - 1956-02-21  Chief Complaint  Patient presents with  . Follow-up  . Hypertension  . Back Pain        Subjective:   Korbin Mapps is a 56 y.o. male here today for a follow up visit. She has no new complaints today. He wants a refill of his medications and something for pain. He claimed the tramadol does not work. He continues to smoke heavily despite repeated counseling. Patient has No headache, No chest pain, No abdominal pain - No Nausea, No new weakness tingling or numbness, No Cough - SOB.  ALLERGIES:  No Known Allergies  PAST MEDICAL HISTORY: Past Medical History  Diagnosis Date  . Hypertension   . Coronary artery disease     MEDICATIONS AT HOME: Prior to Admission medications   Medication Sig Start Date End Date Taking? Authorizing Provider  hydrochlorothiazide (HYDRODIURIL) 25 MG tablet Take 1 tablet (25 mg total) by mouth daily. 06/18/13  Yes Jeanann Lewandowsky, MD  traMADol (ULTRAM) 50 MG tablet Take 1 tablet (50 mg total) by mouth every 8 (eight) hours as needed for pain. 04/16/13  Yes Richarda Overlie, MD  acetaminophen-codeine (TYLENOL #3) 300-30 MG per tablet Take 1 tablet by mouth every 4 (four) hours as needed for pain. 06/18/13   Jeanann Lewandowsky, MD  amLODipine (NORVASC) 5 MG tablet Take 1 tablet (5 mg total) by mouth daily. 06/18/13   Jeanann Lewandowsky, MD  rosuvastatin (CRESTOR) 20 MG tablet Take 1 tablet (20 mg total) by mouth daily. 06/18/13   Jeanann Lewandowsky, MD     Objective:   Filed Vitals:   06/18/13 1707  BP: 166/89  Pulse: 61  Temp: 98.7 F (37.1 C)  TempSrc: Oral  Resp: 18  Height: 6\' 1"  (1.854 m)  Weight: 166 lb (75.297 kg)  SpO2: 99%    Exam General appearance :Awake, alert, not in any distress. Speech Clear. Not toxic Looking HEENT: Atraumatic and Normocephalic,  pupils equally reactive to light and accomodation Neck: supple, no JVD. No cervical lymphadenopathy.  Chest:Good air entry bilaterally, no added sounds  CVS: S1 S2 regular, no murmurs.  Abdomen: Bowel sounds present, Non tender and not distended with no gaurding, rigidity or rebound. Extremities: B/L Lower Ext shows no edema, both legs are warm to touch Neurology: Awake alert, and oriented X 3, CN II-XII intact, Non focal Skin:No Rash Wounds:N/A   Data Review   CBC No results found for this basename: WBC, HGB, HCT, PLT, MCV, MCH, MCHC, RDW, NEUTRABS, LYMPHSABS, MONOABS, EOSABS, BASOSABS, BANDABS, BANDSABD,  in the last 168 hours  Chemistries   No results found for this basename: NA, K, CL, CO2, GLUCOSE, BUN, CREATININE, GFRCGP, CALCIUM, MG, AST, ALT, ALKPHOS, BILITOT,  in the last 168 hours ------------------------------------------------------------------------------------------------------------------ No results found for this basename: HGBA1C,  in the last 72 hours ------------------------------------------------------------------------------------------------------------------ No results found for this basename: CHOL, HDL, LDLCALC, TRIG, CHOLHDL, LDLDIRECT,  in the last 72 hours ------------------------------------------------------------------------------------------------------------------ No results found for this basename: TSH, T4TOTAL, FREET3, T3FREE, THYROIDAB,  in the last 72 hours ------------------------------------------------------------------------------------------------------------------ No results found for this basename: VITAMINB12, FOLATE, FERRITIN, TIBC, IRON, RETICCTPCT,  in the last 72 hours  Coagulation profile  No results found for this basename: INR, PROTIME,  in the last 168 hours    Assessment & Plan   Patient Active Problem List  Diagnosis Date Noted  . Polycythemia, secondary 06/18/2013  . Nicotine dependence 06/18/2013  . Essential  hypertension, benign 06/18/2013  . Dyslipidemia 06/18/2013  . Low back pain 06/18/2013  . Irregular heart rhythm 06/18/2013     Plan: EKG Echocardiogram  EKG shows sinus rhythm with premature ventricular complexes, left ventricular hypertrophy and a possible left atrial enlargement.  Refill the following medications: Hydrochlorothiazide 25 mg tablet by mouth daily Add amlodipine 5 mg tablet by mouth daily for uncontrolled hypertension Start lovastatin 20 mg tablet by mouth daily for dyslipidemia Acetaminophen-codeine 300-30 mg per tablet one tablet by mouth 4 times a day when necessary pain  Patient extensively counseled about smoking cessation will he declined Patient was counseled about exercise and nutrition Patient was told about his polycythemia and the possibility of it coming from excessive smoking and hypoxia, patient appeared to care less  Follow up in 3 months and when necessary   The patient was given clear instructions to go to ER or return to medical center if symptoms don't improve, worsen or new problems develop. The patient verbalized understanding. The patient was told to call to get lab results if they haven't heard anything in the next week.    Jeanann Lewandowsky, MD, MHA, FACP [facility] [place], [place] [phone]   06/18/2013, 6:11 PM

## 2013-06-18 NOTE — Progress Notes (Signed)
Pt here f/u HTN meds and management for back pain. C/o constant cramping in hands after lunc x 3 mnths.

## 2013-10-04 ENCOUNTER — Ambulatory Visit: Payer: BC Managed Care – PPO | Admitting: Internal Medicine

## 2013-11-06 ENCOUNTER — Encounter: Payer: Self-pay | Admitting: Internal Medicine

## 2013-11-06 ENCOUNTER — Ambulatory Visit: Payer: BC Managed Care – PPO | Admitting: Internal Medicine

## 2013-11-06 ENCOUNTER — Ambulatory Visit: Payer: BC Managed Care – PPO | Attending: Internal Medicine | Admitting: Internal Medicine

## 2013-11-06 VITALS — BP 134/86 | HR 59 | Temp 97.9°F | Resp 16 | Ht 73.0 in | Wt 164.0 lb

## 2013-11-06 DIAGNOSIS — F17200 Nicotine dependence, unspecified, uncomplicated: Secondary | ICD-10-CM

## 2013-11-06 DIAGNOSIS — M545 Low back pain, unspecified: Secondary | ICD-10-CM

## 2013-11-06 DIAGNOSIS — N400 Enlarged prostate without lower urinary tract symptoms: Secondary | ICD-10-CM | POA: Insufficient documentation

## 2013-11-06 DIAGNOSIS — I10 Essential (primary) hypertension: Secondary | ICD-10-CM

## 2013-11-06 DIAGNOSIS — E785 Hyperlipidemia, unspecified: Secondary | ICD-10-CM

## 2013-11-06 DIAGNOSIS — M25539 Pain in unspecified wrist: Secondary | ICD-10-CM | POA: Insufficient documentation

## 2013-11-06 DIAGNOSIS — K921 Melena: Secondary | ICD-10-CM

## 2013-11-06 DIAGNOSIS — I2510 Atherosclerotic heart disease of native coronary artery without angina pectoris: Secondary | ICD-10-CM | POA: Insufficient documentation

## 2013-11-06 MED ORDER — AMLODIPINE BESYLATE 5 MG PO TABS: 5.0000 mg | ORAL_TABLET | Freq: Every day | ORAL | Status: DC

## 2013-11-06 MED ORDER — TRAMADOL HCL 50 MG PO TABS: 50.0000 mg | ORAL_TABLET | Freq: Three times a day (TID) | ORAL | Status: DC | PRN

## 2013-11-06 MED ORDER — HYDROCHLOROTHIAZIDE 25 MG PO TABS: 25.0000 mg | ORAL_TABLET | Freq: Every day | ORAL | Status: DC

## 2013-11-06 MED ORDER — ROSUVASTATIN CALCIUM 20 MG PO TABS: 20.0000 mg | ORAL_TABLET | Freq: Every day | ORAL | Status: DC

## 2013-11-06 NOTE — Patient Instructions (Signed)
Back Pain, Adult Low back pain is very common. About 1 in 5 people have back pain.The cause of low back pain is rarely dangerous. The pain often gets better over time.About half of people with a sudden onset of back pain feel better in just 2 weeks. About 8 in 10 people feel better by 6 weeks.  CAUSES Some common causes of back pain include:  Strain of the muscles or ligaments supporting the spine.  Wear and tear (degeneration) of the spinal discs.  Arthritis.  Direct injury to the back. DIAGNOSIS Most of the time, the direct cause of low back pain is not known.However, back pain can be treated effectively even when the exact cause of the pain is unknown.Answering your caregiver's questions about your overall health and symptoms is one of the most accurate ways to make sure the cause of your pain is not dangerous. If your caregiver needs more information, he or she may order lab work or imaging tests (X-rays or MRIs).However, even if imaging tests show changes in your back, this usually does not require surgery. HOME CARE INSTRUCTIONS For many people, back pain returns.Since low back pain is rarely dangerous, it is often a condition that people can learn to manageon their own.   Remain active. It is stressful on the back to sit or stand in one place. Do not sit, drive, or stand in one place for more than 30 minutes at a time. Take short walks on level surfaces as soon as pain allows.Try to increase the length of time you walk each day.  Do not stay in bed.Resting more than 1 or 2 days can delay your recovery.  Do not avoid exercise or work.Your body is made to move.It is not dangerous to be active, even though your back may hurt.Your back will likely heal faster if you return to being active before your pain is gone.  Pay attention to your body when you bend and lift. Many people have less discomfortwhen lifting if they bend their knees, keep the load close to their bodies,and  avoid twisting. Often, the most comfortable positions are those that put less stress on your recovering back.  Find a comfortable position to sleep. Use a firm mattress and lie on your side with your knees slightly bent. If you lie on your back, put a pillow under your knees.  Only take over-the-counter or prescription medicines as directed by your caregiver. Over-the-counter medicines to reduce pain and inflammation are often the most helpful.Your caregiver may prescribe muscle relaxant drugs.These medicines help dull your pain so you can more quickly return to your normal activities and healthy exercise.  Put ice on the injured area.  Put ice in a plastic bag.  Place a towel between your skin and the bag.  Leave the ice on for 15-20 minutes, 03-04 times a day for the first 2 to 3 days. After that, ice and heat may be alternated to reduce pain and spasms.  Ask your caregiver about trying back exercises and gentle massage. This may be of some benefit.  Avoid feeling anxious or stressed.Stress increases muscle tension and can worsen back pain.It is important to recognize when you are anxious or stressed and learn ways to manage it.Exercise is a great option. SEEK MEDICAL CARE IF:  You have pain that is not relieved with rest or medicine.  You have pain that does not improve in 1 week.  You have new symptoms.  You are generally not feeling well. SEEK   IMMEDIATE MEDICAL CARE IF:   You have pain that radiates from your back into your legs.  You develop new bowel or bladder control problems.  You have unusual weakness or numbness in your arms or legs.  You develop nausea or vomiting.  You develop abdominal pain.  You feel faint. Document Released: 09/13/2005 Document Revised: 03/14/2012 Document Reviewed: 02/01/2011 Exit[employer] Patient Information 2014 [employer].  

## 2013-11-06 NOTE — Progress Notes (Signed)
Pt is here today following up on his chronic lumbar back pain. Pt reports that for three days he has been having pain in his left wrist. Pt is requesting a prostate exam.

## 2013-11-06 NOTE — Progress Notes (Signed)
Patient ID: [name] [name], male   DOB: Aug 28, 1956, 58 y.o.   MRN: [id number]   [name] [name], is a 58 y.o. male  [id number]  [id number]  DOB - 17-Apr-1956  Chief Complaint  Patient presents with  . Follow-up        Subjective:   [name] [name] is a 58 y.o. male here today for a follow up visit. Patient is here for medication refills, he has ongoing back pain and he would need pain medication. He is known to have hypertension and dyslipidemia on rosuvastatin 20 mg tablet by mouth daily and hydrochlorothiazide 25 mg tablet by mouth daily with amlodipine 5 mg tablet by mouth daily. He complains of intermittent blood in stool and for that reason he thought it might be his prostate and would like to be checked. Usually fresh blood stained side of stool, and wiping with tissue paper. He denied constipation/diarrhea, no melena. No vomiting or abdominal distention. Appetite is good. Patient continue to smoke heavily about 1 pack per day of cigarette, drinks alcohol heavily. He does not want to quit either. Patient has not had colonoscopy done. His blood pressure is controlled. Patient has No headache, No chest pain, No abdominal pain - No Nausea, No new weakness tingling or numbness, No Cough - SOB.  Problem  Blood in Stool    ALLERGIES: No Known Allergies  PAST MEDICAL HISTORY: Past Medical History  Diagnosis Date  . Hypertension   . Coronary artery disease     MEDICATIONS AT HOME: Prior to Admission medications   Medication Sig Start Date End Date Taking? Authorizing Provider  rosuvastatin (CRESTOR) 20 MG tablet Take 1 tablet (20 mg total) by mouth daily. 11/06/13  Yes Angelica Chessman, MD  traMADol (ULTRAM) 50 MG tablet Take 1 tablet (50 mg total) by mouth every 8 (eight) hours as needed. 11/06/13  Yes Angelica Chessman, MD  acetaminophen-codeine (TYLENOL #3) 300-30 MG per tablet Take 1 tablet by mouth every 4 (four) hours as needed for pain. 06/18/13   Angelica Chessman, MD    amLODipine (NORVASC) 5 MG tablet Take 1 tablet (5 mg total) by mouth daily. 11/06/13   Angelica Chessman, MD  hydrochlorothiazide (HYDRODIURIL) 25 MG tablet Take 1 tablet (25 mg total) by mouth daily. 11/06/13   Angelica Chessman, MD     Objective:   Filed Vitals:   11/06/13 1128  BP: 134/86  Pulse: 59  Temp: 97.9 F (36.6 C)  TempSrc: Oral  Resp: 16  Height: 6\' 1"  (1.854 m)  Weight: 164 lb (74.39 kg)  SpO2: 99%    Exam General appearance : Awake, alert, not in any distress. Speech Clear. Not toxic looking, cigarette stench HEENT: Atraumatic and Normocephalic, pupils equally reactive to light and accomodation Neck: supple, no JVD. No cervical lymphadenopathy.  Chest:Good air entry bilaterally, no added sounds  CVS: S1 S2 regular, no murmurs.  Abdomen: Bowel sounds present, Non tender and not distended with no gaurding, rigidity or rebound. Extremities: B/L Lower Ext shows no edema, both legs are warm to touch Neurology: Awake alert, and oriented X 3, CN II-XII intact, Non focal Skin:No Rash Wounds:N/A Rectal examination: External anal tag at 6:00 Clock, intact External sphincter, empty rectum, mildly enlarged prostate with smooth surface, no mass felt, examination finger stained with some formed stool, no blood.  Data Review  Assessment & Plan   1. Nicotine dependence Patient was extensively counseled on smoking cessation but he outrightly said "no I'm not ready to quit"  2. Essential hypertension, benign:  Controlled Continue - amLODipine (NORVASC) 5 MG tablet; Take 1 tablet (5 mg total) by mouth daily.  Dispense: 90 tablet; Refill: 3 - hydrochlorothiazide (HYDRODIURIL) 25 MG tablet; Take 1 tablet (25 mg total) by mouth daily.  Dispense: 90 tablet; Refill: 3  3. Dyslipidemia  Continue - rosuvastatin (CRESTOR) 20 MG tablet; Take 1 tablet (20 mg total) by mouth daily.  Dispense: 90 tablet; Refill: 3  4. Low back pain  - traMADol (ULTRAM) 50 MG tablet; Take 1 tablet  (50 mg total) by mouth every 8 (eight) hours as needed.  Dispense: 30 tablet; Refill: 0  5. Blood in stool Digital Rectal examination is benign, mildly enlarged prostate  - PSA - Urinalysis, Complete - [facility] COLONOSCOPY - Ambulatory referral to Gastroenterology  Patient was extensively counseled on smoking cessation Patient was counseled on nutrition and exercise  Return in about 3 months (around 02/03/2014), or if symptoms worsen or fail to improve.  The patient was given clear instructions to go to ER or return to medical center if symptoms don't improve, worsen or new problems develop. The patient verbalized understanding. The patient was told to call to get lab results if they haven't heard anything in the next week.    Angelica Chessman, MD, [place], [place], [place] and [place], [place]   11/06/2013, 11:44 AM

## 2013-11-07 LAB — URINALYSIS, COMPLETE
Bacteria, UA: NONE SEEN
Casts: NONE SEEN
GLUCOSE, UA: NEGATIVE mg/dL
Hgb urine dipstick: NEGATIVE
Ketones, ur: 15 mg/dL — AB
Leukocytes, UA: NEGATIVE
Nitrite: NEGATIVE
PROTEIN: 30 mg/dL — AB
SPECIFIC GRAVITY, URINE: 1.03 (ref 1.005–1.030)
Urobilinogen, UA: 1 mg/dL (ref 0.0–1.0)
pH: 6 (ref 5.0–8.0)

## 2013-11-07 LAB — PSA: PSA: 1 ng/mL (ref <=4.00)

## 2013-11-09 ENCOUNTER — Telehealth: Payer: Self-pay | Admitting: *Deleted

## 2013-11-09 NOTE — Telephone Encounter (Signed)
Message copied by [name] [name] on Fri Nov 09, 2013 12:36 PM ------      Message from: [name] [name]      Created: Fri Nov 09, 2013 10:49 AM       Please inform patient that his prostate test was normal. His urine shows a form of dehydration, encourage him to drink plenty of fluid in form of water ------

## 2013-11-09 NOTE — Telephone Encounter (Signed)
LVM for patient to call back. ?

## 2013-12-07 ENCOUNTER — Encounter: Payer: Self-pay | Admitting: Internal Medicine

## 2014-01-11 NOTE — Progress Notes (Signed)
varified date and time-arrive with daughter-take am meds-bring meds and ins. card

## 2014-01-16 ENCOUNTER — Encounter: Payer: Self-pay | Admitting: Ophthalmology

## 2014-01-16 ENCOUNTER — Encounter (HOSPITAL_BASED_OUTPATIENT_CLINIC_OR_DEPARTMENT_OTHER)
Admission: RE | Disposition: A | Discharge status: Home / Self Care | Payer: Self-pay | Source: Ambulatory Visit | Attending: Ophthalmology

## 2014-01-16 ENCOUNTER — Other Ambulatory Visit: Payer: Self-pay | Admitting: Ophthalmology

## 2014-01-16 ENCOUNTER — Encounter (HOSPITAL_BASED_OUTPATIENT_CLINIC_OR_DEPARTMENT_OTHER): Payer: Self-pay | Admitting: *Deleted

## 2014-01-16 ENCOUNTER — Ambulatory Visit (HOSPITAL_BASED_OUTPATIENT_CLINIC_OR_DEPARTMENT_OTHER)
Admission: RE | Admit: 2014-01-16 | Discharge: 2014-01-16 | Disposition: A | Discharge status: Home / Self Care | Payer: BC Managed Care – PPO | Source: Ambulatory Visit | Attending: Ophthalmology | Admitting: Ophthalmology

## 2014-01-16 DIAGNOSIS — H0019 Chalazion unspecified eye, unspecified eyelid: Secondary | ICD-10-CM | POA: Insufficient documentation

## 2014-01-16 DIAGNOSIS — H01009 Unspecified blepharitis unspecified eye, unspecified eyelid: Secondary | ICD-10-CM | POA: Insufficient documentation

## 2014-01-16 DIAGNOSIS — F17200 Nicotine dependence, unspecified, uncomplicated: Secondary | ICD-10-CM | POA: Insufficient documentation

## 2014-01-16 HISTORY — PX: CHALAZION EXCISION: SHX213

## 2014-01-16 SURGERY — MINOR EXCISION OF CHALAZION
Anesthesia: LOCAL | Site: Eye | Laterality: Left

## 2014-01-16 MED ORDER — LIDOCAINE HCL (PF) 1 % IJ SOLN
INTRAMUSCULAR | Status: AC
  Filled 2014-01-16: qty 5

## 2014-01-16 MED ORDER — BSS IO SOLN
INTRAOCULAR | Status: AC
  Filled 2014-01-16: qty 15

## 2014-01-16 MED ORDER — HYDROCODONE-ACETAMINOPHEN 5-325 MG PO TABS: 1.0000 | ORAL_TABLET | ORAL | Status: DC | PRN

## 2014-01-16 MED ORDER — LIDOCAINE HCL 1 % IJ SOLN
INTRAMUSCULAR | Status: DC | PRN
  Administered 2014-01-16: 1 mL
  Administered 2014-01-16: 4 mL

## 2014-01-16 MED ORDER — BACITRACIN-POLYMYXIN B 500-10000 UNIT/GM OP OINT
TOPICAL_OINTMENT | OPHTHALMIC | Status: AC
  Filled 2014-01-16: qty 3.5

## 2014-01-16 MED ORDER — BACITRACIN-POLYMYXIN B 500-10000 UNIT/GM OP OINT
TOPICAL_OINTMENT | OPHTHALMIC | Status: DC | PRN
  Administered 2014-01-16: 1 via OPHTHALMIC

## 2014-01-16 SURGICAL SUPPLY — 16 items
APPLICATOR COTTON TIP 6IN STRL (MISCELLANEOUS) ×2 IMPLANT
BANDAGE EYE OVAL (MISCELLANEOUS) ×4 IMPLANT
BLADE 11 SAFETY STRL DISP (BLADE) ×2 IMPLANT
CAUTERY EYE LOW TEMP 1300F FIN (OPHTHALMIC RELATED) IMPLANT
GLOVE ECLIPSE 7.0 STRL STRAW (GLOVE) ×3 IMPLANT
MARKER SKIN DUAL TIP RULER LAB (MISCELLANEOUS) ×2 IMPLANT
NDL HYPO 25X5/8 SAFETYGLIDE (NEEDLE) ×1 IMPLANT
NDL SAFETY ECLIPSE 18X1.5 (NEEDLE) ×1 IMPLANT
NEEDLE HYPO 18GX1.5 SHARP (NEEDLE) ×2
NEEDLE HYPO 25X5/8 SAFETYGLIDE (NEEDLE) ×2 IMPLANT
PAD ALCOHOL SWAB (MISCELLANEOUS) ×2 IMPLANT
SPONGE GAUZE 4X4 12PLY STER LF (GAUZE/BANDAGES/DRESSINGS) ×2 IMPLANT
SUT SILK 6 0 P 1 (SUTURE) IMPLANT
SWABSTICK POVIDONE IODINE SNGL (MISCELLANEOUS) ×4 IMPLANT
SYR CONTROL 10ML LL (SYRINGE) ×2 IMPLANT
TOWEL OR 17X24 6PK STRL BLUE (TOWEL DISPOSABLE) ×2 IMPLANT

## 2014-01-16 NOTE — Brief Op Note (Signed)
01/16/2014  7:45 AM  PATIENT:  [name] [name]  58 y.o. male  PRE-OPERATIVE DIAGNOSIS:  CHALZION LEFT LOWER EYELID  POST-OPERATIVE DIAGNOSIS:  * Same *  PROCEDURE:  Procedure(s): MINOR EXCISION OF CHALAZION (Left)  SURGEON:  Surgeon(s) and Role:    Myrtha Mantis., MD - Primary  PHYSICIAN ASSISTANT:   ASSISTANTS: none   ANESTHESIA:   local  EBL:     BLOOD ADMINISTERED:none  DRAINS: none   LOCAL MEDICATIONS USED:  XYLOCAINE  and LIDOCAINE   SPECIMEN:  No Specimen  DISPOSITION OF SPECIMEN:  N/A  COUNTS:  YES  TOURNIQUET:  * No tourniquets in log *  DICTATION: .Other Dictation: Dictation Number [id number]  PLAN OF CARE: Discharge to home after PACU  PATIENT DISPOSITION:  Short Stay   Delay start of Pharmacological VTE agent (>24hrs) due to surgical blood loss or risk of bleeding: not applicable

## 2014-01-16 NOTE — Discharge Instructions (Signed)
Remove patch tomorrow.  Then resume drops to leftt eye 4 x day.  Call office for appointment 1 week.  Patient to go to Dr. Fredderick Severance office for prescriptions. D.Mande Auvil, RN

## 2014-01-16 NOTE — H&P (Addendum)
  58 yo male has been treated for several weeks for a hordeolum and blepharitis lower lid left eye.  Has a large mass lower lid left eye at sight of the previous hordeolum.  Lesion at times tender.  Patient admitted at this time for chalazion excision lower lid left eye.  H & P submitted to medical records to be scanned into Epic.

## 2014-01-17 ENCOUNTER — Encounter (HOSPITAL_BASED_OUTPATIENT_CLINIC_OR_DEPARTMENT_OTHER): Payer: Self-pay | Admitting: Ophthalmology

## 2014-01-18 NOTE — Op Note (Signed)
NAMEJULLIAN, [name] [name] NO.:  [id number]  MEDICAL RECORD NO.:  [id number]  LOCATION:                               FACILITY:  [facility]  PHYSICIAN:  Garlan Fair., M.D.DATE OF BIRTH:  June 04, 1956  DATE OF PROCEDURE:  01/17/2014 DATE OF DISCHARGE:  01/16/2014                              OPERATIVE REPORT   PREOPERATIVE DIAGNOSIS:  Chalazion lower lid, left eye.  POSTOPERATIVE DIAGNOSIS:  Chalazion lower lid, left eye.  OPERATION:  Chalazion excision.  ANESTHESIA:  Local using Xylocaine 1%.  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  The acute infection was resolved after he took oral and topical antibiotics. However, a large chalazion persists, being approximately 15 x 10 mm in its greatest diameter.  The lesion seemed to remain infected even after continuing to use the topical antibiotic drops.  Because the lesion has persisted in size and because it was tender, the patient requested that the lesion be excised.  He is admitted at this time for that purpose.  PROCEDURE:  The patient was brought to the operating room, and prepped and draped in usual manner.  The lower lid of the left eye was infiltrated with several milliliter of Xylocaine.  The chalazion clamp was applied over the lesion and the lid was everted.  A cruciate incision made in the tarsus over the lesion.  The lesion was curetted using the chalazion curette.  A large amount of caseous material was excised with the scissors.  At the end of the procedure, Polysporin ophthalmic ointment and a pressure patch was applied.  The patient tolerated the procedure well.  He was discharged in satisfactory condition with instructions to keep the patch on today, to take Vicodin, every four hours as needed for pain.  He is to remove the patch  tomorrow, to resume his drops 4 times a day, and to see me in office in 1 week.    DISCHARGE DIAGNOSIS:  Chalazion lower lid, left eye    Garlan Fair., M.D.     TB/MEDQ  D:  01/17/2014  T:  01/17/2014  Job:  [id number]

## 2014-02-07 ENCOUNTER — Ambulatory Visit: Payer: BC Managed Care – PPO | Admitting: Internal Medicine

## 2015-01-27 ENCOUNTER — Ambulatory Visit: Payer: 59 | Attending: Internal Medicine | Admitting: Internal Medicine

## 2015-01-27 ENCOUNTER — Encounter: Payer: Self-pay | Admitting: Internal Medicine

## 2015-01-27 VITALS — BP 131/92 | HR 100 | Temp 98.0°F | Resp 16 | Wt 167.0 lb

## 2015-01-27 DIAGNOSIS — Z720 Tobacco use: Secondary | ICD-10-CM | POA: Insufficient documentation

## 2015-01-27 DIAGNOSIS — E785 Hyperlipidemia, unspecified: Secondary | ICD-10-CM | POA: Insufficient documentation

## 2015-01-27 DIAGNOSIS — I10 Essential (primary) hypertension: Secondary | ICD-10-CM | POA: Diagnosis not present

## 2015-01-27 DIAGNOSIS — R600 Localized edema: Secondary | ICD-10-CM | POA: Diagnosis not present

## 2015-01-27 DIAGNOSIS — M545 Low back pain, unspecified: Secondary | ICD-10-CM

## 2015-01-27 LAB — CBC WITH DIFFERENTIAL/PLATELET
BASOS ABS: 0 10*3/uL (ref 0.0–0.1)
BASOS PCT: 1 % (ref 0–1)
Eosinophils Absolute: 0 10*3/uL (ref 0.0–0.7)
Eosinophils Relative: 0 % (ref 0–5)
HCT: 48.4 % (ref 39.0–52.0)
Hemoglobin: 16.1 g/dL (ref 13.0–17.0)
Lymphocytes Relative: 43 % (ref 12–46)
Lymphs Abs: 1.5 10*3/uL (ref 0.7–4.0)
MCH: 33.2 pg (ref 26.0–34.0)
MCHC: 33.3 g/dL (ref 30.0–36.0)
MCV: 99.8 fL (ref 78.0–100.0)
MPV: 10.6 fL (ref 8.6–12.4)
Monocytes Absolute: 0.5 10*3/uL (ref 0.1–1.0)
Monocytes Relative: 13 % — ABNORMAL HIGH (ref 3–12)
Neutro Abs: 1.5 10*3/uL — ABNORMAL LOW (ref 1.7–7.7)
Neutrophils Relative %: 43 % (ref 43–77)
Platelets: 223 10*3/uL (ref 150–400)
RBC: 4.85 MIL/uL (ref 4.22–5.81)
RDW: 15.1 % (ref 11.5–15.5)
WBC: 3.6 10*3/uL — AB (ref 4.0–10.5)

## 2015-01-27 LAB — COMPLETE METABOLIC PANEL WITH GFR
ALBUMIN: 4 g/dL (ref 3.5–5.2)
ALT: 15 U/L (ref 0–53)
AST: 36 U/L (ref 0–37)
Alkaline Phosphatase: 108 U/L (ref 39–117)
BILIRUBIN TOTAL: 1.1 mg/dL (ref 0.2–1.2)
BUN: 10 mg/dL (ref 6–23)
CHLORIDE: 105 meq/L (ref 96–112)
CO2: 28 mEq/L (ref 19–32)
Calcium: 9.1 mg/dL (ref 8.4–10.5)
Creat: 0.94 mg/dL (ref 0.50–1.35)
GFR, Est African American: >89 mL/min
GFR, Est Non African American: 88 mL/min
GLUCOSE: 77 mg/dL (ref 70–99)
POTASSIUM: 3.8 meq/L (ref 3.5–5.3)
SODIUM: 143 meq/L (ref 135–145)
Total Protein: 6.2 g/dL (ref 6.0–8.3)

## 2015-01-27 LAB — LIPID PANEL
CHOL/HDL RATIO: 3.7 ratio
CHOLESTEROL: 196 mg/dL (ref 0–200)
HDL: 53 mg/dL (ref >=40)
LDL Cholesterol: 121 mg/dL — ABNORMAL HIGH (ref 0–99)
Triglycerides: 109 mg/dL (ref <150)
VLDL: 22 mg/dL (ref 0–40)

## 2015-01-27 LAB — TSH: TSH: 0.474 u[IU]/mL (ref 0.350–4.500)

## 2015-01-27 LAB — POCT GLYCOSYLATED HEMOGLOBIN (HGB A1C): Hemoglobin A1C: 5.7

## 2015-01-27 MED ORDER — TRAMADOL HCL 50 MG PO TABS: 50.0000 mg | ORAL_TABLET | Freq: Three times a day (TID) | ORAL | Status: DC | PRN

## 2015-01-27 MED ORDER — ROSUVASTATIN CALCIUM 20 MG PO TABS: 20.0000 mg | ORAL_TABLET | Freq: Every day | ORAL | Status: DC

## 2015-01-27 MED ORDER — LISINOPRIL-HYDROCHLOROTHIAZIDE 10-12.5 MG PO TABS: 1.0000 | ORAL_TABLET | Freq: Every day | ORAL | Status: DC

## 2015-01-27 NOTE — Patient Instructions (Signed)
Smoking Cessation Quitting smoking is important to your health and has many advantages. However, it is not always easy to quit since nicotine is a very addictive drug. Oftentimes, people try 3 times or more before being able to quit. This document explains the best ways for you to prepare to quit smoking. Quitting takes hard work and a lot of effort, but you can do it. ADVANTAGES OF QUITTING SMOKING  You will live longer, feel better, and live better.  Your body will feel the impact of quitting smoking almost immediately.  Within 20 minutes, blood pressure decreases. Your pulse returns to its normal level.  After 8 hours, carbon monoxide levels in the blood return to normal. Your oxygen level increases.  After 24 hours, the chance of having a heart attack starts to decrease. Your breath, hair, and body stop smelling like smoke.  After 48 hours, damaged nerve endings begin to recover. Your sense of taste and smell improve.  After 72 hours, the body is virtually free of nicotine. Your bronchial tubes relax and breathing becomes easier.  After 2 to 12 weeks, lungs can hold more air. Exercise becomes easier and circulation improves.  The risk of having a heart attack, stroke, cancer, or lung disease is greatly reduced.  After 1 year, the risk of coronary heart disease is cut in half.  After 5 years, the risk of stroke falls to the same as a nonsmoker.  After 10 years, the risk of lung cancer is cut in half and the risk of other cancers decreases significantly.  After 15 years, the risk of coronary heart disease drops, usually to the level of a nonsmoker.  If you are pregnant, quitting smoking will improve your chances of having a healthy baby.  The people you live with, especially any children, will be healthier.  You will have extra money to spend on things other than cigarettes. QUESTIONS TO THINK ABOUT BEFORE ATTEMPTING TO QUIT You may want to talk about your answers with your  health care provider.  Why do you want to quit?  If you tried to quit in the past, what helped and what did not?  What will be the most difficult situations for you after you quit? How will you plan to handle them?  Who can help you through the tough times? Your family? Friends? A health care provider?  What pleasures do you get from smoking? What ways can you still get pleasure if you quit? Here are some questions to ask your health care provider:  How can you help me to be successful at quitting?  What medicine do you think would be best for me and how should I take it?  What should I do if I need more help?  What is smoking withdrawal like? How can I get information on withdrawal? GET READY  Set a quit date.  Change your environment by getting rid of all cigarettes, ashtrays, matches, and lighters in your home, car, or work. Do not let people smoke in your home.  Review your past attempts to quit. Think about what worked and what did not. GET SUPPORT AND ENCOURAGEMENT You have a better chance of being successful if you have help. You can get support in many ways.  Tell your family, friends, and coworkers that you are going to quit and need their support. Ask them not to smoke around you.  Get individual, group, or telephone counseling and support. Programs are available at local hospitals and health centers. Call   your local health department for information about programs in your area.  Spiritual beliefs and practices may help some smokers quit.  Download a "quit meter" on your computer to keep track of quit statistics, such as how long you have gone without smoking, cigarettes not smoked, and money saved.  Get a self-help book about quitting smoking and staying off tobacco. LEARN NEW SKILLS AND BEHAVIORS  Distract yourself from urges to smoke. Talk to someone, go for a walk, or occupy your time with a task.  Change your normal routine. Take a different route to work.  Drink tea instead of coffee. Eat breakfast in a different place.  Reduce your stress. Take a hot bath, exercise, or read a book.  Plan something enjoyable to do every day. Reward yourself for not smoking.  Explore interactive web-based programs that specialize in helping you quit. GET MEDICINE AND USE IT CORRECTLY Medicines can help you stop smoking and decrease the urge to smoke. Combining medicine with the above behavioral methods and support can greatly increase your chances of successfully quitting smoking.  Nicotine replacement therapy helps deliver nicotine to your body without the negative effects and risks of smoking. Nicotine replacement therapy includes nicotine gum, lozenges, inhalers, nasal sprays, and skin patches. Some may be available over-the-counter and others require a prescription.  Antidepressant medicine helps people abstain from smoking, but how this works is unknown. This medicine is available by prescription.  Nicotinic receptor partial agonist medicine simulates the effect of nicotine in your brain. This medicine is available by prescription. Ask your health care provider for advice about which medicines to use and how to use them based on your health history. Your health care provider will tell you what side effects to look out for if you choose to be on a medicine or therapy. Carefully read the information on the package. Do not use any other product containing nicotine while using a nicotine replacement product.  RELAPSE OR DIFFICULT SITUATIONS Most relapses occur within the first 3 months after quitting. Do not be discouraged if you start smoking again. Remember, most people try several times before finally quitting. You may have symptoms of withdrawal because your body is used to nicotine. You may crave cigarettes, be irritable, feel very hungry, cough often, get headaches, or have difficulty concentrating. The withdrawal symptoms are only temporary. They are strongest  when you first quit, but they will go away within 10-14 days. To reduce the chances of relapse, try to:  Avoid drinking alcohol. Drinking lowers your chances of successfully quitting.  Reduce the amount of caffeine you consume. Once you quit smoking, the amount of caffeine in your body increases and can give you symptoms, such as a rapid heartbeat, sweating, and anxiety.  Avoid smokers because they can make you want to smoke.  Do not let weight gain distract you. Many smokers will gain weight when they quit, usually less than 10 pounds. Eat a healthy diet and stay active. You can always lose the weight gained after you quit.  Find ways to improve your mood other than smoking. FOR MORE INFORMATION  www.smokefree.gov  Document Released: 09/07/2001 Document Revised: 01/28/2014 Document Reviewed: 12/23/2011 [employer] Patient Information 2015 [employer]. This information is not intended to replace advice given to you by your health care provider. Make sure you discuss any questions you have with your health care provider. DASH Eating Plan DASH stands for "Dietary Approaches to Stop Hypertension." The DASH eating plan is a healthy eating plan that has   been shown to reduce high blood pressure (hypertension). Additional health benefits may include reducing the risk of type 2 diabetes mellitus, heart disease, and stroke. The DASH eating plan may also help with weight loss. WHAT DO I NEED TO KNOW ABOUT THE DASH EATING PLAN? For the DASH eating plan, you will follow these general guidelines:  Choose foods with a percent daily value for sodium of less than 5% (as listed on the food label).  Use salt-free seasonings or herbs instead of table salt or sea salt.  Check with your health care provider or pharmacist before using salt substitutes.  Eat lower-sodium products, often labeled as "lower sodium" or "no salt added."  Eat fresh foods.  Eat more vegetables, fruits, and low-fat dairy  products.  Choose whole grains. Look for the word "whole" as the first word in the ingredient list.  Choose fish and skinless chicken or turkey more often than red meat. Limit fish, poultry, and meat to 6 oz (170 g) each day.  Limit sweets, desserts, sugars, and sugary drinks.  Choose heart-healthy fats.  Limit cheese to 1 oz (28 g) per day.  Eat more home-cooked food and less restaurant, buffet, and fast food.  Limit fried foods.  Cook foods using methods other than frying.  Limit canned vegetables. If you do use them, rinse them well to decrease the sodium.  When eating at a restaurant, ask that your food be prepared with less salt, or no salt if possible. WHAT FOODS CAN I EAT? Seek help from a dietitian for individual calorie needs. Grains Whole grain or whole wheat bread. Brown rice. Whole grain or whole wheat pasta. Quinoa, bulgur, and whole grain cereals. Low-sodium cereals. Corn or whole wheat flour tortillas. Whole grain cornbread. Whole grain crackers. Low-sodium crackers. Vegetables Fresh or frozen vegetables (raw, steamed, roasted, or grilled). Low-sodium or reduced-sodium tomato and vegetable juices. Low-sodium or reduced-sodium tomato sauce and paste. Low-sodium or reduced-sodium canned vegetables.  Fruits All fresh, canned (in natural juice), or frozen fruits. Meat and Other Protein Products Ground beef (85% or leaner), grass-fed beef, or beef trimmed of fat. Skinless chicken or turkey. Ground chicken or turkey. Pork trimmed of fat. All fish and seafood. Eggs. Dried beans, peas, or lentils. Unsalted nuts and seeds. Unsalted canned beans. Dairy Low-fat dairy products, such as skim or 1% milk, 2% or reduced-fat cheeses, low-fat ricotta or cottage cheese, or plain low-fat yogurt. Low-sodium or reduced-sodium cheeses. Fats and Oils Tub margarines without trans fats. Light or reduced-fat mayonnaise and salad dressings (reduced sodium). Avocado. Safflower, olive, or canola  oils. Natural peanut or almond butter. Other Unsalted popcorn and pretzels. The items listed above may not be a complete list of recommended foods or beverages. Contact your dietitian for more options. WHAT FOODS ARE NOT RECOMMENDED? Grains White bread. White pasta. White rice. Refined cornbread. Bagels and croissants. Crackers that contain trans fat. Vegetables Creamed or fried vegetables. Vegetables in a cheese sauce. Regular canned vegetables. Regular canned tomato sauce and paste. Regular tomato and vegetable juices. Fruits Dried fruits. Canned fruit in light or heavy syrup. Fruit juice. Meat and Other Protein Products Fatty cuts of meat. Ribs, chicken wings, bacon, sausage, bologna, salami, chitterlings, fatback, hot dogs, bratwurst, and packaged luncheon meats. Salted nuts and seeds. Canned beans with salt. Dairy Whole or 2% milk, cream, half-and-half, and cream cheese. Whole-fat or sweetened yogurt. Full-fat cheeses or blue cheese. Nondairy creamers and whipped toppings. Processed cheese, cheese spreads, or cheese curds. Condiments Onion and garlic salt,   seasoned salt, table salt, and sea salt. Canned and packaged gravies. Worcestershire sauce. Tartar sauce. Barbecue sauce. Teriyaki sauce. Soy sauce, including reduced sodium. Steak sauce. Fish sauce. Oyster sauce. Cocktail sauce. Horseradish. Ketchup and mustard. Meat flavorings and tenderizers. Bouillon cubes. Hot sauce. Tabasco sauce. Marinades. Taco seasonings. Relishes. Fats and Oils Butter, stick margarine, lard, shortening, ghee, and bacon fat. Coconut, palm kernel, or palm oils. Regular salad dressings. Other Pickles and olives. Salted popcorn and pretzels. The items listed above may not be a complete list of foods and beverages to avoid. Contact your dietitian for more information. WHERE CAN I FIND MORE INFORMATION? National Heart, Lung, and Blood Institute: travelstabloid.com Document Released:  09/02/2011 Document Revised: 01/28/2014 Document Reviewed: 07/18/2013 [facility] Patient Information 2015 [place], [place]. This information is not intended to replace advice given to you by your health care provider. Make sure you discuss any questions you have with your health care provider. Hypertension Hypertension, commonly called high blood pressure, is when the force of blood pumping through your arteries is too strong. Your arteries are the blood vessels that carry blood from your heart throughout your body. A blood pressure reading consists of a higher number over a lower number, such as 110/72. The higher number (systolic) is the pressure inside your arteries when your heart pumps. The lower number (diastolic) is the pressure inside your arteries when your heart relaxes. Ideally you want your blood pressure below 120/80. Hypertension forces your heart to work harder to pump blood. Your arteries may become narrow or stiff. Having hypertension puts you at risk for heart disease, stroke, and other problems.  RISK FACTORS Some risk factors for high blood pressure are controllable. Others are not.  Risk factors you cannot control include:   Race. You may be at higher risk if you are African American.  Age. Risk increases with age.  Gender. Men are at higher risk than women before age 51 years. After age 42, women are at higher risk than men. Risk factors you can control include:  Not getting enough exercise or physical activity.  Being overweight.  Getting too much fat, sugar, calories, or salt in your diet.  Drinking too much alcohol. SIGNS AND SYMPTOMS Hypertension does not usually cause signs or symptoms. Extremely high blood pressure (hypertensive crisis) may cause headache, anxiety, shortness of breath, and nosebleed. DIAGNOSIS  To check if you have hypertension, your health care provider will measure your blood pressure while you are seated, with your arm held at the level of your  heart. It should be measured at least twice using the same arm. Certain conditions can cause a difference in blood pressure between your right and left arms. A blood pressure reading that is higher than normal on one occasion does not mean that you need treatment. If one blood pressure reading is high, ask your health care provider about having it checked again. TREATMENT  Treating high blood pressure includes making lifestyle changes and possibly taking medicine. Living a healthy lifestyle can help lower high blood pressure. You may need to change some of your habits. Lifestyle changes may include:  Following the DASH diet. This diet is high in fruits, vegetables, and whole grains. It is low in salt, red meat, and added sugars.  Getting at least 2 hours of brisk physical activity every week.  Losing weight if necessary.  Not smoking.  Limiting alcoholic beverages.  Learning ways to reduce stress. If lifestyle changes are not enough to get your blood pressure under control,  your health care provider may prescribe medicine. You may need to take more than one. Work closely with your health care provider to understand the risks and benefits. HOME CARE INSTRUCTIONS  Have your blood pressure rechecked as directed by your health care provider.   Take medicines only as directed by your health care provider. Follow the directions carefully. Blood pressure medicines must be taken as prescribed. The medicine does not work as well when you skip doses. Skipping doses also puts you at risk for problems.   Do not smoke.   Monitor your blood pressure at home as directed by your health care provider. SEEK MEDICAL CARE IF:   You think you are having a reaction to medicines taken.  You have recurrent headaches or feel dizzy.  You have swelling in your ankles.  You have trouble with your vision. SEEK IMMEDIATE MEDICAL CARE IF:  You develop a severe headache or confusion.  You have unusual  weakness, numbness, or feel faint.  You have severe chest or abdominal pain.  You vomit repeatedly.  You have trouble breathing. MAKE SURE YOU:   Understand these instructions.  Will watch your condition.  Will get help right away if you are not doing well or get worse. Document Released: 09/13/2005 Document Revised: 01/28/2014 Document Reviewed: 07/06/2013 [facility] Patient Information 2015 [place], [place]. This information is not intended to replace advice given to you by your health care provider. Make sure you discuss any questions you have with your health care provider.

## 2015-01-27 NOTE — Progress Notes (Signed)
Patient ID: [name] [name], male   DOB: Jul 17, 1956, 59 y.o.   MRN: [id number]   [name] [name], is a 59 y.o. male  ZDG:[id number]  [id number]  DOB - September 09, 1956  Chief Complaint  Patient presents with  . Follow-up    blood pressure        Subjective:   [name] [name] is a 59 y.o. male here today for a follow up visit. Patient has hypertension and dyslipidemia, he has not been on his medications for over 6 months. Last follow-up was February 2015. Patient continues to smoke heavily about 1 pack of cigarettes per day. He drinks alcohol heavily as well. He is not ready to quit. He has been counseled several times in the past. He has no complaints today, he is here for medication refills. Patient has No headache, No chest pain, No abdominal pain - No Nausea, No new weakness tingling or numbness, No Cough - SOB.  Problem  Pedal Edema    ALLERGIES: No Known Allergies  PAST MEDICAL HISTORY: Past Medical History  Diagnosis Date  . Hypertension   . Coronary artery disease     MEDICATIONS AT HOME: Prior to Admission medications   Medication Sig Start Date End Date Taking? Authorizing Provider  lisinopril-hydrochlorothiazide (PRINZIDE,ZESTORETIC) 10-12.5 MG per tablet Take 1 tablet by mouth daily. 01/27/15   Tresa Garter, MD  rosuvastatin (CRESTOR) 20 MG tablet Take 1 tablet (20 mg total) by mouth daily. 01/27/15   Tresa Garter, MD  traMADol (ULTRAM) 50 MG tablet Take 1 tablet (50 mg total) by mouth every 8 (eight) hours as needed. 01/27/15   Tresa Garter, MD     Objective:   Filed Vitals:   01/27/15 1447  BP: 131/92  Pulse: 100  Temp: 98 F (36.7 C)  Resp: 16  Weight: 167 lb (75.751 kg)  SpO2: 100%    Exam General appearance : Awake, alert, not in any distress. Speech Clear. Not toxic looking HEENT: Atraumatic and Normocephalic, pupils equally reactive to light and accomodation Neck: supple, no JVD. No cervical lymphadenopathy.  Chest:Good air entry  bilaterally, no added sounds  CVS: S1 S2 regular, no murmurs.  Abdomen: Bowel sounds present, Non tender and not distended with no gaurding, rigidity or rebound. Extremities: B/L Lower Ext shows no edema, both legs are warm to touch Neurology: Awake alert, and oriented X 3, CN II-XII intact, Non focal Skin:No Rash  Data Review Lab Results  Component Value Date   HGBA1C  06/23/2008    5.9 (NOTE)   The ADA recommends the following therapeutic goal for glycemic   control related to Hgb A1C measurement:   Goal of Therapy:   < 7.0% Hgb A1C   Reference: American Diabetes Association: Clinical Practice   Recommendations 2008, Diabetes Care,  2008, 31:(Suppl 1).     Assessment & Plan   1. Essential hypertension, benign  - lisinopril-hydrochlorothiazide (PRINZIDE,ZESTORETIC) 10-12.5 MG per tablet; Take 1 tablet by mouth daily.  Dispense: 90 tablet; Refill: 3 - CBC with Differential/Platelet - COMPLETE METABOLIC PANEL WITH GFR - POCT glycosylated hemoglobin (Hb A1C) - Lipid panel - TSH - Echocardiogram; Future  We have discussed target BP range and blood pressure goal. I have advised patient to check BP regularly and to call us back or report to clinic if the numbers are consistently higher than 140/90. We discussed the importance of compliance with medical therapy and DASH diet recommended, consequences of uncontrolled hypertension discussed.  - continue current BP medications  2. Dyslipidemia  -  rosuvastatin (CRESTOR) 20 MG tablet; Take 1 tablet (20 mg total) by mouth daily.  Dispense: 90 tablet; Refill: 3 To address this please limit saturated fat to no more than 7% of your calories, limit cholesterol to 200 mg/day, increase fiber and exercise as tolerated. If needed we may add another cholesterol lowering medication to your regimen.   3. Midline low back pain without sciatica  - traMADol (ULTRAM) 50 MG tablet; Take 1 tablet (50 mg total) by mouth every 8 (eight) hours as needed.   Dispense: 60 tablet; Refill: 0  4. Pedal edema  - Echocardiogram; Future  The patient was counseled on the dangers of tobacco use, and was advised to quit. Reviewed strategies to maximize success, including removing cigarettes and smoking materials from environment, stress management and support of family/friends.  Fontaine was counseled on the dangers of tobacco use, and was advised to quit. Reviewed strategies to maximize success, including removing cigarettes and smoking materials from environment, stress management and support of family/friends.  Patient have been counseled extensively about nutrition and exercise Return in about 3 months (around 04/29/2015) for Follow up HTN, Follow up Pain and comorbidities.  The patient was given clear instructions to go to ER or return to medical center if symptoms don't improve, worsen or new problems develop. The patient verbalized understanding. The patient was told to call to get lab results if they haven't heard anything in the next week.   This note has been created with Surveyor, quantity. Any transcriptional errors are unintentional.    Angelica Chessman, MD, [place], [place], [place], [place] and [place], [place]   01/27/2015, 4:04 PM

## 2015-01-27 NOTE — Progress Notes (Signed)
Patient here for follow up on his blood pressure Patient states he has not had his medications in over six months Requesting refills

## 2015-01-29 ENCOUNTER — Encounter: Payer: Self-pay | Admitting: *Deleted

## 2015-01-29 NOTE — Progress Notes (Signed)
Patient ID: [name] [name], male   DOB: 03-18-1956, 59 y.o.   MRN: [id number]  Obtained [employer] prior authorization for 01/30/15 scheduled echocardiogram it is [phone]

## 2015-01-30 ENCOUNTER — Telehealth: Payer: Self-pay

## 2015-01-30 ENCOUNTER — Ambulatory Visit (HOSPITAL_COMMUNITY)
Admission: RE | Admit: 2015-01-30 | Discharge: 2015-01-30 | Disposition: A | Discharge status: Home / Self Care | Payer: 59 | Source: Ambulatory Visit | Attending: Internal Medicine | Admitting: Internal Medicine

## 2015-01-30 DIAGNOSIS — I10 Essential (primary) hypertension: Secondary | ICD-10-CM | POA: Insufficient documentation

## 2015-01-30 DIAGNOSIS — R600 Localized edema: Secondary | ICD-10-CM | POA: Insufficient documentation

## 2015-01-30 DIAGNOSIS — I351 Nonrheumatic aortic (valve) insufficiency: Secondary | ICD-10-CM | POA: Diagnosis not present

## 2015-01-30 DIAGNOSIS — I340 Nonrheumatic mitral (valve) insufficiency: Secondary | ICD-10-CM | POA: Insufficient documentation

## 2015-01-30 NOTE — Telephone Encounter (Signed)
-----   Message from Tresa Garter, MD sent at 01/29/2015  6:25 PM EDT ----- Please inform patient that his laboratory test results are mostly within normal limit except for his cholesterol that is slightly high.To address this please limit saturated fat to no more than 7% of your calories, limit cholesterol to 200 mg/day, increase fiber and exercise as tolerated. If needed we may add another cholesterol lowering medication to your regimen.

## 2015-01-30 NOTE — Telephone Encounter (Signed)
Patient not available Left message with family member to have him return our call 

## 2015-01-30 NOTE — Progress Notes (Signed)
*  PRELIMINARY RESULTS* Echocardiogram 2D Echocardiogram has been performed.  Leavy Cella 01/30/2015, 3:25 PM

## 2015-01-31 ENCOUNTER — Ambulatory Visit (HOSPITAL_COMMUNITY)
Admission: RE | Admit: 2015-01-31 | Discharge: 2015-01-31 | Disposition: A | Discharge status: Home / Self Care | Payer: 59 | Source: Ambulatory Visit | Attending: Cardiology | Admitting: Cardiology

## 2015-01-31 ENCOUNTER — Encounter (HOSPITAL_COMMUNITY): Payer: Self-pay

## 2015-01-31 VITALS — BP 126/84 | HR 100 | Wt 170.2 lb

## 2015-01-31 DIAGNOSIS — I5022 Chronic systolic (congestive) heart failure: Secondary | ICD-10-CM | POA: Diagnosis not present

## 2015-01-31 DIAGNOSIS — F1010 Alcohol abuse, uncomplicated: Secondary | ICD-10-CM | POA: Diagnosis not present

## 2015-01-31 DIAGNOSIS — E785 Hyperlipidemia, unspecified: Secondary | ICD-10-CM | POA: Diagnosis not present

## 2015-01-31 DIAGNOSIS — Z79899 Other long term (current) drug therapy: Secondary | ICD-10-CM | POA: Diagnosis not present

## 2015-01-31 DIAGNOSIS — I255 Ischemic cardiomyopathy: Secondary | ICD-10-CM | POA: Insufficient documentation

## 2015-01-31 DIAGNOSIS — F17200 Nicotine dependence, unspecified, uncomplicated: Secondary | ICD-10-CM | POA: Insufficient documentation

## 2015-01-31 DIAGNOSIS — I10 Essential (primary) hypertension: Secondary | ICD-10-CM | POA: Diagnosis not present

## 2015-01-31 DIAGNOSIS — F17210 Nicotine dependence, cigarettes, uncomplicated: Secondary | ICD-10-CM | POA: Insufficient documentation

## 2015-01-31 DIAGNOSIS — I2510 Atherosclerotic heart disease of native coronary artery without angina pectoris: Secondary | ICD-10-CM | POA: Insufficient documentation

## 2015-01-31 DIAGNOSIS — Z720 Tobacco use: Secondary | ICD-10-CM

## 2015-01-31 HISTORY — DX: Chronic systolic (congestive) heart failure: I50.22

## 2015-01-31 MED ORDER — SPIRONOLACTONE 25 MG PO TABS: 12.5000 mg | ORAL_TABLET | Freq: Every day | ORAL | Status: DC

## 2015-01-31 MED ORDER — FUROSEMIDE 20 MG PO TABS: 20.0000 mg | ORAL_TABLET | Freq: Every day | ORAL | Status: DC

## 2015-01-31 MED ORDER — LISINOPRIL 10 MG PO TABS: 10.0000 mg | ORAL_TABLET | Freq: Every day | ORAL | Status: DC

## 2015-01-31 NOTE — Patient Instructions (Signed)
STOP Lisinopril-Hydrochlorithiazide.  START Lisinopril 10mg  tablet once daily.  START Spironolactone 12.5 mg (1/2 tablet) once daily.  START Lasix 40mg  (2 tablets)once daily for THREE days, then reduce to 20mg  (1 tablet) once daily.  Heart cath scheduled next Tuesday 02/04/15. See instruction sheet.  Follow up 1 week after cath.  Do the following things EVERYDAY: 1) Weigh yourself in the morning before breakfast. Write it down and keep it in a log. 2) Take your medicines as prescribed 3) Eat low salt foods-Limit salt (sodium) to 2000 mg per day.  4) Stay as active as you can everyday 5) Limit all fluids for the day to less than 2 liters

## 2015-01-31 NOTE — Progress Notes (Signed)
Patient ID: [name] [name], male   DOB: November 01, 1955, 59 y.o.   MRN: [id number] PCP: Dr Doreene Burke  59 yo with history of HTN, smoking, and ETOH abuse presents for CHF clinic evaluation.  Patient had an admission to [facility] in 2009 for chest pain.  At that time, echo showed EF 35-40% with coronary CTA showing possible obstructive disease in the left circumflex. He did not have cardiac cath.  He had limited medical contact up until recently when he began to see Dr Doreene Burke.  He is on meds for HTN and hyperlipidemia now.  He saw Dr Doreene Burke recently, reporting 1-2 weeks of ankle edema.  He was sent for echo and was noted to have a mildly dilated LV with severe systolic dysfunction, EF 67%.  On further questioning, he has had 3-4 weeks of exertional dyspnea.  He is short of breath after walking 1/2 block or walking up a flight of steps.  He has had 3 pillow orthopnea for a month.  No palpitations, lightheadedness, or syncope.  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  He has had no chest pain at rest.  It is getting very hard for him to do his job. Of note, he smokes 1 ppd and drinks 1 pint liquor/day.  He used to do cocaine but no longer uses it.  He was recently started on Crestor for elevated LDL.   ECG: NSR, LAFB, LVH with repolarization abnormality  Labs (5/16): K 3.8, creatinine 0.94, HCT 48.4, LDL 121, HDL 53  PMH: 1. Low back pain 2. HTN 3. Hyperlipidemia 4. H/o cocaine abuse: Not active. 5. ETOH abuse: 1 pint/day 6. CAD: Coronary CT angiogram in 9/09 showed possible significant obstructive disease in the circumflex.  He did not have cardiac cath.  7. Cardiomyopathy: Echo (9/09) with EF 35-40%.  Echo (5/16) with EF 10%, mild LV dilation, grade II diastolic dysfunction, mildly dilated RV with moderately decreased systolic function.   8. Active smoker  SH:  Lives in [place], works as Dealer, lives with son, smokes 1  ppd, drinks 1 pint/day liquor, prior cocaine use.   FH: Brother with MI in his 17s, another brother with ?SCD, mother with CVA.   ROS: All systems reviewed and negative except as per HPI.   Current Outpatient Prescriptions  Medication Sig Dispense Refill  . rosuvastatin (CRESTOR) 20 MG tablet Take 1 tablet (20 mg total) by mouth daily. 90 tablet 3  . traMADol (ULTRAM) 50 MG tablet Take 1 tablet (50 mg total) by mouth every 8 (eight) hours as needed. 60 tablet 0  . furosemide (LASIX) 20 MG tablet Take 1 tablet (20 mg total) by mouth daily. 90 tablet 3  . lisinopril (PRINIVIL,ZESTRIL) 10 MG tablet Take 1 tablet (10 mg total) by mouth daily. 90 tablet 3  . spironolactone (ALDACTONE) 25 MG tablet Take 0.5 tablets (12.5 mg total) by mouth daily. 45 tablet 3   No current facility-administered medications for this encounter.   BP 126/84 mmHg  Pulse 100  Wt 170 lb 4 oz (77.225 kg)  SpO2 98% General: NAD Neck: JVP 10 cm, no thyromegaly or thyroid nodule.  Lungs: Clear to auscultation bilaterally with normal respiratory effort. CV: Nondisplaced PMI.  Heart regular S1/S2, no S3/S4, no murmur.  1+ ankle edema.  No carotid bruit.  Difficult to palpate pedal pulses.  Abdomen: Soft, nontender, no hepatosplenomegaly, no distention.  Skin: Intact  without lesions or rashes.  Neurologic: Alert and oriented x 3.  Psych: Normal affect. Extremities: No clubbing or cyanosis.  HEENT: Normal.   Assessment/Plan:  1. Chronic systolic CHF: Markedly reduced LV systolic function (EF 29%) with moderate RV dysfunction.  Ischemic cardiomyopathy is a distinct possibility with known CAD from 2009 and anginal symptoms.  I suspect that ETOH and maybe cocaine also play a role in his cardiomyopathy. On exam, he is volume overloaded.  NYHA class III symptoms. Good BP.  - Stop lisinopril/HCTZ - Start lisinopril 10 mg bid.  - Start spironolactone 12.5 mg daily.  - Start Lasix 40 mg daily x 3 days then 20 mg daily  long-term.   - Will not start beta blocker yet, would like to see volume under better control first.  - Needs BMET/BNP in 10 days.  - We discussed complete abstinence from ETOH for now and also cocaine (says he is not using cocaine anymore).  - I will arrange for left and right heart catheterization next week.  We discussed risks/benefits of the procedure and he agrees to proceed.  2. CAD: Patient has known CAD from prior coronary CT angiogram.  As above, I am concerned for ischemic cardiomyopathy.  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  - Start ASA 81 daily.  - He was recently started on statin.  - As above, plan for coronary angiography.  3. ETOH abuse: We discussed cutting back, ideally stopping altogether.  4. Smoking: I strongly encouraged him to quit smoking.   [name] [name] 01/31/2015

## 2015-02-03 NOTE — Telephone Encounter (Signed)
-----   Message from Tresa Garter, MD sent at 02/03/2015  4:50 PM EDT ----- Please inform patient that his echocardiogram report shows some abnormalities that need to be addressed by cardiologist, please confirm that patient is being scheduled for heart catheterization to find out the reason for his low heart function.

## 2015-02-03 NOTE — Telephone Encounter (Signed)
Left message with family member for pt to call as soon as message received

## 2015-02-04 ENCOUNTER — Inpatient Hospital Stay (HOSPITAL_COMMUNITY)
Admission: RE | Admit: 2015-02-04 | Discharge: 2015-02-07 | DRG: 287 | Disposition: A | Discharge status: Home / Self Care | Payer: 59 | Source: Ambulatory Visit | Attending: Cardiology | Admitting: Cardiology

## 2015-02-04 ENCOUNTER — Encounter (HOSPITAL_COMMUNITY)
Admission: RE | Disposition: A | Discharge status: Home / Self Care | Payer: 59 | Source: Ambulatory Visit | Attending: Cardiology

## 2015-02-04 DIAGNOSIS — Z823 Family history of stroke: Secondary | ICD-10-CM | POA: Diagnosis not present

## 2015-02-04 DIAGNOSIS — Z8249 Family history of ischemic heart disease and other diseases of the circulatory system: Secondary | ICD-10-CM | POA: Diagnosis not present

## 2015-02-04 DIAGNOSIS — F1010 Alcohol abuse, uncomplicated: Secondary | ICD-10-CM | POA: Diagnosis present

## 2015-02-04 DIAGNOSIS — R0600 Dyspnea, unspecified: Secondary | ICD-10-CM | POA: Diagnosis present

## 2015-02-04 DIAGNOSIS — I2510 Atherosclerotic heart disease of native coronary artery without angina pectoris: Secondary | ICD-10-CM | POA: Diagnosis not present

## 2015-02-04 DIAGNOSIS — I426 Alcoholic cardiomyopathy: Secondary | ICD-10-CM | POA: Diagnosis present

## 2015-02-04 DIAGNOSIS — I10 Essential (primary) hypertension: Secondary | ICD-10-CM | POA: Diagnosis present

## 2015-02-04 DIAGNOSIS — Z79899 Other long term (current) drug therapy: Secondary | ICD-10-CM

## 2015-02-04 DIAGNOSIS — I5023 Acute on chronic systolic (congestive) heart failure: Secondary | ICD-10-CM | POA: Diagnosis present

## 2015-02-04 DIAGNOSIS — F17210 Nicotine dependence, cigarettes, uncomplicated: Secondary | ICD-10-CM | POA: Diagnosis present

## 2015-02-04 DIAGNOSIS — E785 Hyperlipidemia, unspecified: Secondary | ICD-10-CM | POA: Diagnosis present

## 2015-02-04 HISTORY — PX: CARDIAC CATHETERIZATION: SHX172

## 2015-02-04 LAB — PROTIME-INR
INR: 1.1 (ref 0.00–1.49)
INR: 1.21 (ref 0.00–1.49)
PROTHROMBIN TIME: 14.3 s (ref 11.6–15.2)
Prothrombin Time: 15.5 seconds — ABNORMAL HIGH (ref 11.6–15.2)

## 2015-02-04 LAB — COMPREHENSIVE METABOLIC PANEL
ALT: 14 U/L — AB (ref 17–63)
AST: 20 U/L (ref 15–41)
Albumin: 3.5 g/dL (ref 3.5–5.0)
Alkaline Phosphatase: 104 U/L (ref 38–126)
Anion gap: 11 (ref 5–15)
BILIRUBIN TOTAL: 2 mg/dL — AB (ref 0.3–1.2)
BUN: 13 mg/dL (ref 6–20)
CO2: 29 mmol/L (ref 22–32)
Calcium: 9.2 mg/dL (ref 8.9–10.3)
Chloride: 99 mmol/L — ABNORMAL LOW (ref 101–111)
Creatinine, Ser: 1.11 mg/dL (ref 0.61–1.24)
GFR calc non Af Amer: >60 mL/min (ref >60)
GLUCOSE: 91 mg/dL (ref 70–99)
POTASSIUM: 3.8 mmol/L (ref 3.5–5.1)
Sodium: 139 mmol/L (ref 135–145)
TOTAL PROTEIN: 6 g/dL — AB (ref 6.5–8.1)

## 2015-02-04 LAB — CBC WITH DIFFERENTIAL/PLATELET
BASOS PCT: 0 % (ref 0–1)
Basophils Absolute: 0 10*3/uL (ref 0.0–0.1)
EOS ABS: 0 10*3/uL (ref 0.0–0.7)
Eosinophils Relative: 1 % (ref 0–5)
HCT: 47.8 % (ref 39.0–52.0)
HEMOGLOBIN: 15.9 g/dL (ref 13.0–17.0)
LYMPHS ABS: 1.9 10*3/uL (ref 0.7–4.0)
Lymphocytes Relative: 44 % (ref 12–46)
MCH: 33.1 pg (ref 26.0–34.0)
MCHC: 33.3 g/dL (ref 30.0–36.0)
MCV: 99.6 fL (ref 78.0–100.0)
MONO ABS: 0.5 10*3/uL (ref 0.1–1.0)
MONOS PCT: 11 % (ref 3–12)
Neutro Abs: 2 10*3/uL (ref 1.7–7.7)
Neutrophils Relative %: 44 % (ref 43–77)
Platelets: 183 10*3/uL (ref 150–400)
RBC: 4.8 MIL/uL (ref 4.22–5.81)
RDW: 14.1 % (ref 11.5–15.5)
WBC: 4.4 10*3/uL (ref 4.0–10.5)

## 2015-02-04 LAB — TSH: TSH: 0.606 u[IU]/mL (ref 0.350–4.500)

## 2015-02-04 LAB — MRSA PCR SCREENING: MRSA BY PCR: NEGATIVE

## 2015-02-04 LAB — MAGNESIUM: Magnesium: 1.9 mg/dL (ref 1.7–2.4)

## 2015-02-04 LAB — BRAIN NATRIURETIC PEPTIDE: B NATRIURETIC PEPTIDE 5: 2342.8 pg/mL — AB (ref 0.0–100.0)

## 2015-02-04 SURGERY — RIGHT/LEFT HEART CATH AND CORONARY ANGIOGRAPHY
Anesthesia: LOCAL

## 2015-02-04 MED ORDER — ASPIRIN EC 81 MG PO TBEC
81.0000 mg | DELAYED_RELEASE_TABLET | Freq: Every day | ORAL | Status: DC
  Administered 2015-02-05 – 2015-02-07 (×3): 81 mg via ORAL
  Filled 2015-02-04 (×3): qty 1

## 2015-02-04 MED ORDER — ISOSORB DINITRATE-HYDRALAZINE 20-37.5 MG PO TABS
0.5000 | ORAL_TABLET | Freq: Three times a day (TID) | ORAL | Status: DC
  Administered 2015-02-04 – 2015-02-05 (×4): 0.5 via ORAL
  Filled 2015-02-04 (×4): qty 1

## 2015-02-04 MED ORDER — FOLIC ACID 1 MG PO TABS
1.0000 mg | ORAL_TABLET | Freq: Every day | ORAL | Status: DC
  Administered 2015-02-04 – 2015-02-07 (×4): 1 mg via ORAL
  Filled 2015-02-04 (×4): qty 1

## 2015-02-04 MED ORDER — SODIUM CHLORIDE 0.9 % IJ SOLN: 3.0000 mL | Freq: Two times a day (BID) | INTRAMUSCULAR | Status: DC

## 2015-02-04 MED ORDER — MIDAZOLAM HCL 2 MG/2ML IJ SOLN
INTRAMUSCULAR | Status: DC | PRN
  Administered 2015-02-04: 1 mg via INTRAVENOUS

## 2015-02-04 MED ORDER — SODIUM CHLORIDE 0.9 % IJ SOLN: 3.0000 mL | INTRAMUSCULAR | Status: DC | PRN

## 2015-02-04 MED ORDER — ACETAMINOPHEN 325 MG PO TABS: 650.0000 mg | ORAL_TABLET | ORAL | Status: DC | PRN

## 2015-02-04 MED ORDER — SODIUM CHLORIDE 0.9 % IV SOLN: 250.0000 mL | INTRAVENOUS | Status: DC | PRN

## 2015-02-04 MED ORDER — FENTANYL CITRATE (PF) 100 MCG/2ML IJ SOLN
INTRAMUSCULAR | Status: DC | PRN
  Administered 2015-02-04: 25 ug via INTRAVENOUS

## 2015-02-04 MED ORDER — HEPARIN SODIUM (PORCINE) 5000 UNIT/ML IJ SOLN
5000.0000 [IU] | Freq: Three times a day (TID) | INTRAMUSCULAR | Status: DC
  Administered 2015-02-05 – 2015-02-07 (×4): 5000 [IU] via SUBCUTANEOUS
  Filled 2015-02-04 (×5): qty 1

## 2015-02-04 MED ORDER — VERAPAMIL HCL 2.5 MG/ML IV SOLN
INTRAVENOUS | Status: AC
  Filled 2015-02-04: qty 2

## 2015-02-04 MED ORDER — SPIRONOLACTONE 25 MG PO TABS
25.0000 mg | ORAL_TABLET | Freq: Every day | ORAL | Status: DC
  Administered 2015-02-05 – 2015-02-07 (×3): 25 mg via ORAL
  Filled 2015-02-04 (×3): qty 1

## 2015-02-04 MED ORDER — DIGOXIN 125 MCG PO TABS
0.1250 mg | ORAL_TABLET | Freq: Every day | ORAL | Status: DC
  Administered 2015-02-05 – 2015-02-07 (×3): 0.125 mg via ORAL
  Filled 2015-02-04 (×4): qty 1

## 2015-02-04 MED ORDER — ASPIRIN 81 MG PO CHEW
CHEWABLE_TABLET | ORAL | Status: AC
  Administered 2015-02-04: 81 mg via ORAL
  Filled 2015-02-04: qty 1

## 2015-02-04 MED ORDER — MIDAZOLAM HCL 2 MG/2ML IJ SOLN
INTRAMUSCULAR | Status: AC
  Filled 2015-02-04: qty 2

## 2015-02-04 MED ORDER — IOHEXOL 350 MG/ML SOLN
INTRAVENOUS | Status: DC | PRN
  Administered 2015-02-04: 90 mL via INTRACARDIAC

## 2015-02-04 MED ORDER — ONDANSETRON HCL 4 MG/2ML IJ SOLN: 4.0000 mg | Freq: Four times a day (QID) | INTRAMUSCULAR | Status: DC | PRN

## 2015-02-04 MED ORDER — SODIUM CHLORIDE 0.9 % IV SOLN
INTRAVENOUS | Status: DC
  Administered 2015-02-04: 12:00:00 via INTRAVENOUS

## 2015-02-04 MED ORDER — HEPARIN SODIUM (PORCINE) 5000 UNIT/ML IJ SOLN: 5000.0000 [IU] | Freq: Three times a day (TID) | INTRAMUSCULAR | Status: DC

## 2015-02-04 MED ORDER — HEPARIN SODIUM (PORCINE) 1000 UNIT/ML IJ SOLN
INTRAMUSCULAR | Status: AC
  Filled 2015-02-04: qty 1

## 2015-02-04 MED ORDER — FENTANYL CITRATE (PF) 100 MCG/2ML IJ SOLN
INTRAMUSCULAR | Status: AC
  Filled 2015-02-04: qty 2

## 2015-02-04 MED ORDER — FUROSEMIDE 10 MG/ML IJ SOLN
40.0000 mg | Freq: Two times a day (BID) | INTRAMUSCULAR | Status: DC
  Administered 2015-02-04 – 2015-02-05 (×3): 40 mg via INTRAVENOUS
  Filled 2015-02-04 (×3): qty 4

## 2015-02-04 MED ORDER — POTASSIUM CHLORIDE CRYS ER 20 MEQ PO TBCR
20.0000 meq | EXTENDED_RELEASE_TABLET | Freq: Two times a day (BID) | ORAL | Status: DC
  Administered 2015-02-04 – 2015-02-07 (×6): 20 meq via ORAL
  Filled 2015-02-04 (×6): qty 1

## 2015-02-04 MED ORDER — ASPIRIN 81 MG PO CHEW
81.0000 mg | CHEWABLE_TABLET | ORAL | Status: AC
  Administered 2015-02-04: 81 mg via ORAL

## 2015-02-04 MED ORDER — LORAZEPAM 2 MG/ML IJ SOLN: 2.0000 mg | INTRAMUSCULAR | Status: DC | PRN

## 2015-02-04 MED ORDER — ROSUVASTATIN CALCIUM 20 MG PO TABS
20.0000 mg | ORAL_TABLET | Freq: Every day | ORAL | Status: DC
  Administered 2015-02-04 – 2015-02-06 (×3): 20 mg via ORAL
  Filled 2015-02-04 (×3): qty 1

## 2015-02-04 MED ORDER — PNEUMOCOCCAL VAC POLYVALENT 25 MCG/0.5ML IJ INJ
0.5000 mL | INJECTION | INTRAMUSCULAR | Status: AC
  Administered 2015-02-05: 0.5 mL via INTRAMUSCULAR
  Filled 2015-02-04: qty 0.5

## 2015-02-04 MED ORDER — ADULT MULTIVITAMIN W/MINERALS CH
1.0000 | ORAL_TABLET | Freq: Every day | ORAL | Status: DC
  Administered 2015-02-04 – 2015-02-07 (×4): 1 via ORAL
  Filled 2015-02-04 (×4): qty 1

## 2015-02-04 MED ORDER — VITAMIN B-1 100 MG PO TABS
100.0000 mg | ORAL_TABLET | Freq: Every day | ORAL | Status: DC
  Administered 2015-02-04 – 2015-02-07 (×4): 100 mg via ORAL
  Filled 2015-02-04 (×4): qty 1

## 2015-02-04 MED ORDER — NICOTINE 14 MG/24HR TD PT24
14.0000 mg | MEDICATED_PATCH | Freq: Every day | TRANSDERMAL | Status: DC
  Administered 2015-02-04 – 2015-02-07 (×4): 14 mg via TRANSDERMAL
  Filled 2015-02-04 (×4): qty 1

## 2015-02-04 MED ORDER — SODIUM CHLORIDE 0.9 % IV SOLN
INTRAVENOUS | Status: DC | PRN
  Administered 2015-02-04: 10 mL/h via INTRAVENOUS

## 2015-02-04 MED ORDER — DIGOXIN 250 MCG PO TABS
0.2500 mg | ORAL_TABLET | Freq: Once | ORAL | Status: AC
  Administered 2015-02-04: 0.25 mg via ORAL
  Filled 2015-02-04: qty 1

## 2015-02-04 MED ORDER — SODIUM CHLORIDE 0.9 % IJ SOLN
3.0000 mL | Freq: Two times a day (BID) | INTRAMUSCULAR | Status: DC
  Administered 2015-02-04 – 2015-02-05 (×3): 3 mL via INTRAVENOUS

## 2015-02-04 MED ORDER — HEPARIN SODIUM (PORCINE) 1000 UNIT/ML IJ SOLN
INTRAMUSCULAR | Status: DC | PRN
  Administered 2015-02-04: 4000 [IU] via INTRAVENOUS

## 2015-02-04 MED ORDER — LIDOCAINE HCL (PF) 1 % IJ SOLN
INTRAMUSCULAR | Status: AC
  Filled 2015-02-04: qty 30

## 2015-02-04 MED ORDER — VERAPAMIL HCL 2.5 MG/ML IV SOLN
INTRAVENOUS | Status: DC | PRN
  Administered 2015-02-04: 15:00:00 via INTRA_ARTERIAL

## 2015-02-04 MED ORDER — LISINOPRIL 5 MG PO TABS
5.0000 mg | ORAL_TABLET | Freq: Two times a day (BID) | ORAL | Status: DC
  Administered 2015-02-04 – 2015-02-07 (×6): 5 mg via ORAL
  Filled 2015-02-04 (×6): qty 1

## 2015-02-04 MED ORDER — HEPARIN (PORCINE) IN NACL 2-0.9 UNIT/ML-% IJ SOLN
INTRAMUSCULAR | Status: AC
Start: 2015-02-04 — End: 2015-02-04
  Filled 2015-02-04: qty 1000

## 2015-02-04 SURGICAL SUPPLY — 17 items
CATH BALLN WEDGE 5F 110CM (CATHETERS) ×2 IMPLANT
CATH INFINITI 5 FR JL3.5 (CATHETERS) ×2 IMPLANT
CATH INFINITI 5FR ANG PIGTAIL (CATHETERS) ×2 IMPLANT
CATH INFINITI 5FR JL4 (CATHETERS) ×1 IMPLANT
CATH INFINITI JR4 5F (CATHETERS) ×2 IMPLANT
DEVICE RAD COMP TR BAND LRG (VASCULAR PRODUCTS) ×2 IMPLANT
ELECT DEFIB PAD ADLT CADENCE (PAD) ×1 IMPLANT
GLIDESHEATH SLEND SS 6F .021 (SHEATH) ×2 IMPLANT
KIT HEART LEFT (KITS) ×2 IMPLANT
PACK CARDIAC CATHETERIZATION (CUSTOM PROCEDURE TRAY) ×2 IMPLANT
SHEATH FAST CATH BRACH 5F 5CM (SHEATH) ×2 IMPLANT
SYR MEDRAD MARK V 150ML (SYRINGE) ×1 IMPLANT
TRANSDUCER W/STOPCOCK (MISCELLANEOUS) ×3 IMPLANT
TUBING CIL FLEX 10 FLL-RA (TUBING) ×2 IMPLANT
WIRE EMERALD 3MM-J .025X260CM (WIRE) ×1 IMPLANT
WIRE HI TORQ VERSACORE-J 145CM (WIRE) ×2 IMPLANT
WIRE SAFE-T 1.5MM-J .035X260CM (WIRE) ×2 IMPLANT

## 2015-02-04 NOTE — Progress Notes (Signed)
Paged IV team for an update on PICC placement. There is not a PICC nurse until the AM. Will continue to monitor.

## 2015-02-04 NOTE — H&P (View-Only) (Signed)
Patient ID: [name] [name], male   DOB: 10/01/1955, 59 y.o.   MRN: [id number] PCP: Dr Doreene Burke  59 yo with history of HTN, smoking, and ETOH abuse presents for CHF clinic evaluation.  Patient had an admission to [facility] in 2009 for chest pain.  At that time, echo showed EF 35-40% with coronary CTA showing possible obstructive disease in the left circumflex. He did not have cardiac cath.  He had limited medical contact up until recently when he began to see Dr Doreene Burke.  He is on meds for HTN and hyperlipidemia now.  He saw Dr Doreene Burke recently, reporting 1-2 weeks of ankle edema.  He was sent for echo and was noted to have a mildly dilated LV with severe systolic dysfunction, EF 26%.  On further questioning, he has had 3-4 weeks of exertional dyspnea.  He is short of breath after walking 1/2 block or walking up a flight of steps.  He has had 3 pillow orthopnea for a month.  No palpitations, lightheadedness, or syncope.  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  He has had no chest pain at rest.  It is getting very hard for him to do his job. Of note, he smokes 1 ppd and drinks 1 pint liquor/day.  He used to do cocaine but no longer uses it.  He was recently started on Crestor for elevated LDL.   ECG: NSR, LAFB, LVH with repolarization abnormality  Labs (5/16): K 3.8, creatinine 0.94, HCT 48.4, LDL 121, HDL 53  PMH: 1. Low back pain 2. HTN 3. Hyperlipidemia 4. H/o cocaine abuse: Not active. 5. ETOH abuse: 1 pint/day 6. CAD: Coronary CT angiogram in 9/09 showed possible significant obstructive disease in the circumflex.  He did not have cardiac cath.  7. Cardiomyopathy: Echo (9/09) with EF 35-40%.  Echo (5/16) with EF 10%, mild LV dilation, grade II diastolic dysfunction, mildly dilated RV with moderately decreased systolic function.   8. Active smoker  SH:  Lives in [place], works as Dealer, lives with son, smokes 1  ppd, drinks 1 pint/day liquor, prior cocaine use.   FH: Brother with MI in his 10s, another brother with ?SCD, mother with CVA.   ROS: All systems reviewed and negative except as per HPI.   Current Outpatient Prescriptions  Medication Sig Dispense Refill  . rosuvastatin (CRESTOR) 20 MG tablet Take 1 tablet (20 mg total) by mouth daily. 90 tablet 3  . traMADol (ULTRAM) 50 MG tablet Take 1 tablet (50 mg total) by mouth every 8 (eight) hours as needed. 60 tablet 0  . furosemide (LASIX) 20 MG tablet Take 1 tablet (20 mg total) by mouth daily. 90 tablet 3  . lisinopril (PRINIVIL,ZESTRIL) 10 MG tablet Take 1 tablet (10 mg total) by mouth daily. 90 tablet 3  . spironolactone (ALDACTONE) 25 MG tablet Take 0.5 tablets (12.5 mg total) by mouth daily. 45 tablet 3   No current facility-administered medications for this encounter.   BP 126/84 mmHg  Pulse 100  Wt 170 lb 4 oz (77.225 kg)  SpO2 98% General: NAD Neck: JVP 10 cm, no thyromegaly or thyroid nodule.  Lungs: Clear to auscultation bilaterally with normal respiratory effort. CV: Nondisplaced PMI.  Heart regular S1/S2, no S3/S4, no murmur.  1+ ankle edema.  No carotid bruit.  Difficult to palpate pedal pulses.  Abdomen: Soft, nontender, no hepatosplenomegaly, no distention.  Skin: Intact  without lesions or rashes.  Neurologic: Alert and oriented x 3.  Psych: Normal affect. Extremities: No clubbing or cyanosis.  HEENT: Normal.   Assessment/Plan:  1. Chronic systolic CHF: Markedly reduced LV systolic function (EF 76%) with moderate RV dysfunction.  Ischemic cardiomyopathy is a distinct possibility with known CAD from 2009 and anginal symptoms.  I suspect that ETOH and maybe cocaine also play a role in his cardiomyopathy. On exam, he is volume overloaded.  NYHA class III symptoms. Good BP.  - Stop lisinopril/HCTZ - Start lisinopril 10 mg bid.  - Start spironolactone 12.5 mg daily.  - Start Lasix 40 mg daily x 3 days then 20 mg daily  long-term.   - Will not start beta blocker yet, would like to see volume under better control first.  - Needs BMET/BNP in 10 days.  - We discussed complete abstinence from ETOH for now and also cocaine (says he is not using cocaine anymore).  - I will arrange for left and right heart catheterization next week.  We discussed risks/benefits of the procedure and he agrees to proceed.  2. CAD: Patient has known CAD from prior coronary CT angiogram.  As above, I am concerned for ischemic cardiomyopathy.  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  - Start ASA 81 daily.  - He was recently started on statin.  - As above, plan for coronary angiography.  3. ETOH abuse: We discussed cutting back, ideally stopping altogether.  4. Smoking: I strongly encouraged him to quit smoking.   [name] [name] 01/31/2015

## 2015-02-04 NOTE — Interval H&P Note (Signed)
Cath Lab Visit (complete for each Cath Lab visit)  Clinical Evaluation Leading to the Procedure:   ACS: No.  Non-ACS:    Anginal Classification: CCS III  Anti-ischemic medical therapy: Minimal Therapy (1 class of medications)  Non-Invasive Test Results: No non-invasive testing performed  Prior CABG: No previous CABG      History and Physical Interval Note:  02/04/2015 2:50 PM  [name] [name]  has presented today for surgery, with the diagnosis of chf  The various methods of treatment have been discussed with the patient and family. After consideration of risks, benefits and other options for treatment, the patient has consented to  Procedure(s): Right/Left Heart Cath and Coronary Angiography (N/A) as a surgical intervention .  The patient's history has been reviewed, patient examined, no change in status, stable for surgery.  I have reviewed the patient's chart and labs.  Questions were answered to the patient's satisfaction.     Jasie Meleski [employer]

## 2015-02-05 ENCOUNTER — Encounter (HOSPITAL_COMMUNITY): Payer: Self-pay | Admitting: Cardiology

## 2015-02-05 DIAGNOSIS — F1010 Alcohol abuse, uncomplicated: Secondary | ICD-10-CM

## 2015-02-05 DIAGNOSIS — I5023 Acute on chronic systolic (congestive) heart failure: Principal | ICD-10-CM

## 2015-02-05 LAB — BASIC METABOLIC PANEL
Anion gap: 10 (ref 5–15)
BUN: 12 mg/dL (ref 6–20)
CHLORIDE: 104 mmol/L (ref 101–111)
CO2: 26 mmol/L (ref 22–32)
Calcium: 8.9 mg/dL (ref 8.9–10.3)
Creatinine, Ser: 1.07 mg/dL (ref 0.61–1.24)
GFR calc Af Amer: >60 mL/min (ref >60)
GLUCOSE: 91 mg/dL (ref 70–99)
Potassium: 4 mmol/L (ref 3.5–5.1)
Sodium: 140 mmol/L (ref 135–145)

## 2015-02-05 LAB — POCT I-STAT 3, VENOUS BLOOD GAS (G3P V)
Acid-base deficit: 1 mmol/L (ref 0.0–2.0)
BICARBONATE: 23.1 meq/L (ref 20.0–24.0)
BICARBONATE: 25 meq/L — AB (ref 20.0–24.0)
O2 Saturation: 50 %
O2 Saturation: 52 %
PCO2 VEN: 36.6 mmHg — AB (ref 45.0–50.0)
PH VEN: 7.409 — AB (ref 7.250–7.300)
PH VEN: 7.415 — AB (ref 7.250–7.300)
PO2 VEN: 27 mmHg — AB (ref 30.0–45.0)
TCO2: 24 mmol/L (ref 0–100)
TCO2: 26 mmol/L (ref 0–100)
pCO2, Ven: 38.9 mmHg — ABNORMAL LOW (ref 45.0–50.0)
pO2, Ven: 27 mmHg — CL (ref 30.0–45.0)

## 2015-02-05 LAB — CARBOXYHEMOGLOBIN
Carboxyhemoglobin: 1.4 % (ref 0.5–1.5)
Methemoglobin: 1 % (ref 0.0–1.5)
O2 SAT: 67 %
TOTAL HEMOGLOBIN: 15.9 g/dL (ref 13.5–18.0)

## 2015-02-05 LAB — HIV ANTIBODY (ROUTINE TESTING W REFLEX): HIV SCREEN 4TH GENERATION: NONREACTIVE

## 2015-02-05 MED ORDER — GUAIFENESIN-DM 100-10 MG/5ML PO SYRP
5.0000 mL | ORAL_SOLUTION | ORAL | Status: DC | PRN
  Administered 2015-02-05: 5 mL via ORAL
  Filled 2015-02-05: qty 5

## 2015-02-05 MED ORDER — SODIUM CHLORIDE 0.9 % IJ SOLN
10.0000 mL | Freq: Two times a day (BID) | INTRAMUSCULAR | Status: DC
  Administered 2015-02-05 (×2): 10 mL

## 2015-02-05 MED ORDER — SODIUM CHLORIDE 0.9 % IJ SOLN: 10.0000 mL | INTRAMUSCULAR | Status: DC | PRN

## 2015-02-05 MED FILL — Heparin Sodium (Porcine) 2 Unit/ML in Sodium Chloride 0.9%: INTRAMUSCULAR | Qty: 1000 | Status: AC

## 2015-02-05 MED FILL — Lidocaine HCl Local Preservative Free (PF) Inj 1%: INTRAMUSCULAR | Qty: 30 | Status: AC

## 2015-02-05 NOTE — Progress Notes (Signed)
Patient ID: [name] [name], male   DOB: 1955/11/12, 59 y.o.   MRN: [id number]   SUBJECTIVE: Admitted yesterday after cath showed cardiac index 1.4.  Diuresed, cardiac meds adjusted.  Says he feels better this morning, diuresed well overnight.   RHC/LHC:  RA mean 10 RV 51/21 PA 49/28, mean 35 PCWP mean 25 LV 111/20 AO 111/62 Oxygen saturations: PA 51% AO 94% Cardiac Output (Fick) 2.84  Cardiac Index (Fick) 1.41 PVR 3.5 WU Nonobstructive CAD  Scheduled Meds: . aspirin EC  81 mg Oral Daily  . digoxin  0.125 mg Oral Daily  . folic acid  1 mg Oral Daily  . furosemide  40 mg Intravenous BID  . heparin  5,000 Units Subcutaneous 3 times per day  . isosorbide-hydrALAZINE  0.5 tablet Oral TID  . lisinopril  5 mg Oral BID  . multivitamin with minerals  1 tablet Oral Daily  . nicotine  14 mg Transdermal Daily  . pneumococcal 23 valent vaccine  0.5 mL Intramuscular Tomorrow-1000  . potassium chloride  20 mEq Oral BID  . rosuvastatin  20 mg Oral q1800  . sodium chloride  3 mL Intravenous Q12H  . spironolactone  25 mg Oral Daily  . thiamine  100 mg Oral Daily   Continuous Infusions:  PRN Meds:.sodium chloride, acetaminophen, LORazepam, ondansetron (ZOFRAN) IV, sodium chloride    Filed Vitals:   02/04/15 1937 02/05/15 0022 02/05/15 0400 02/05/15 0727  BP: 126/96 91/61 95/70    Pulse: 96 91 102   Temp: 98.4 F (36.9 C) 98.4 F (36.9 C) 98.2 F (36.8 C) 98.3 F (36.8 C)  TempSrc: Oral Oral Oral Oral  Resp: 21  25   Height:   6\' 1"  (1.854 m)   Weight:   158 lb 6.4 oz (71.85 kg)   SpO2: 98% 99% 100%     Intake/Output Summary (Last 24 hours) at 02/05/15 0824 Last data filed at 02/05/15 0600  Gross per 24 hour  Intake    243 ml  Output   2000 ml  Net  -1757 ml    LABS: Basic Metabolic Panel:  Recent Labs  02/04/15 1650 02/05/15 0320  NA 139 140  K 3.8 4.0  CL 99* 104  CO2 29 26  GLUCOSE 91 91  BUN 13 12  CREATININE 1.11 1.07  CALCIUM 9.2 8.9  MG 1.9  --     Liver Function Tests:  Recent Labs  02/04/15 1650  AST 20  ALT 14*  ALKPHOS 104  BILITOT 2.0*  PROT 6.0*  ALBUMIN 3.5   No results for input(s): LIPASE, AMYLASE in the last 72 hours. CBC:  Recent Labs  02/04/15 1650  WBC 4.4  NEUTROABS 2.0  HGB 15.9  HCT 47.8  MCV 99.6  PLT 183   Cardiac Enzymes: No results for input(s): CKTOTAL, CKMB, CKMBINDEX, TROPONINI in the last 72 hours. BNP: Invalid input(s): POCBNP D-Dimer: No results for input(s): DDIMER in the last 72 hours. Hemoglobin A1C: No results for input(s): HGBA1C in the last 72 hours. Fasting Lipid Panel: No results for input(s): CHOL, HDL, LDLCALC, TRIG, CHOLHDL, LDLDIRECT in the last 72 hours. Thyroid Function Tests:  Recent Labs  02/04/15 1650  TSH 0.606   Anemia Panel: No results for input(s): VITAMINB12, FOLATE, FERRITIN, TIBC, IRON, RETICCTPCT in the last 72 hours.  RADIOLOGY: No results found.  PHYSICAL EXAM General: NAD Neck: JVP 10-12 cm, no thyromegaly or thyroid nodule.  Lungs: Clear to auscultation bilaterally with normal respiratory effort. CV: Nondisplaced PMI.  Heart regular S1/S2, +S3, 1/6 HSM apex.  No peripheral edema.   Abdomen: Soft, nontender, no hepatosplenomegaly, no distention.  Neurologic: Alert and oriented x 3.  Psych: Normal affect. Extremities: No clubbing or cyanosis.   TELEMETRY: Reviewed telemetry pt in NSR  ASSESSMENT AND PLAN: 59 yo with history of heavy ETOH was found to have EF 10% by echo.  He had exertional dyspnea and chest pain, taken for LHC/RHC 5/10 showing nonobstructive CAD, elevated filling pressures (but not marked elevation), and low cardiac index.  1. Acute on chronic systolic CHF: EF 33% by echo.  LHC with nonobstructive CAD.  HIV negative.  Heavy ETOH use.  Suspect this may be ETOH cardiomyopathy.  Filling pressures moderately elevated on RHC with low cardiac index 1.4.  At this time with active ETOH abuse, he is poor candidate for advanced  therapies or home milrinone.  Will try to work with po medications and ETOH abstinence.  - Needs to stop drinking.  We have discussed this extensively.  - Continue Lasix 40 mg IV bid today, has diuresed well so far.  - Continue lisinopril and spironolactone, added Bidil 1/2 tab tid.  - Added digoxin.  - Place PICC for monitoring CVP and co-ox.  2. ETOH abuse: No signs of withdrawal. As above, counseled to abstain.   [name] 02/05/2015 8:30 AM

## 2015-02-05 NOTE — Progress Notes (Signed)
UR Completed Gordy Goar Graves-Bigelow, RN,BSN [phone]  

## 2015-02-05 NOTE — Progress Notes (Signed)
Peripherally Inserted Central Catheter/Midline Placement  The IV Nurse has discussed with the patient and/or persons authorized to consent for the patient, the purpose of this procedure and the potential benefits and risks involved with this procedure.  The benefits include less needle sticks, lab draws from the catheter and patient may be discharged home with the catheter.  Risks include, but not limited to, infection, bleeding, blood clot (thrombus formation), and puncture of an artery; nerve damage and irregular heat beat.  Alternatives to this procedure were also discussed.  PICC/Midline Placement Documentation        [name] 02/05/2015, 10:16 AM

## 2015-02-05 NOTE — Care Management Note (Signed)
Case Management Note  Patient Details  Name: [name] [name] MRN: [id number] Date of Birth: 09/07/1956  Subjective/Objective:  Pt admitted for Acute on Chronic CHF. Initiated on IV lasix twice daily.                   Action/Plan: CM to monitor for disposition needs.    Expected Discharge Date:                  Expected Discharge Plan:  [place]  In-House Referral:     Discharge planning Services  CM Consult  Post Acute Care Choice:    Choice offered to:     DME Arranged:    DME Agency:     HH Arranged:    [place] Agency:     Status of Service:     Medicare Important Message Given:    Date Medicare IM Given:    Medicare IM give by:    Date Additional Medicare IM Given:    Additional Medicare Important Message give by:     If discussed at [place] of Stay Meetings, dates discussed:    Additional Comments:  Bethena Roys, RN 02/05/2015, 1:38 PM

## 2015-02-06 LAB — CARBOXYHEMOGLOBIN
CARBOXYHEMOGLOBIN: 1.1 % (ref 0.5–1.5)
Methemoglobin: 1 % (ref 0.0–1.5)
O2 Saturation: 49.4 %
Total hemoglobin: 16.8 g/dL (ref 13.5–18.0)

## 2015-02-06 LAB — BASIC METABOLIC PANEL
Anion gap: 10 (ref 5–15)
BUN: 11 mg/dL (ref 6–20)
CO2: 28 mmol/L (ref 22–32)
CREATININE: 1.05 mg/dL (ref 0.61–1.24)
Calcium: 9 mg/dL (ref 8.9–10.3)
Chloride: 102 mmol/L (ref 101–111)
Glucose, Bld: 101 mg/dL — ABNORMAL HIGH (ref 65–99)
Potassium: 3.7 mmol/L (ref 3.5–5.1)
Sodium: 140 mmol/L (ref 135–145)

## 2015-02-06 MED ORDER — ISOSORB DINITRATE-HYDRALAZINE 20-37.5 MG PO TABS
1.0000 | ORAL_TABLET | Freq: Three times a day (TID) | ORAL | Status: DC
  Administered 2015-02-06 – 2015-02-07 (×4): 1 via ORAL
  Filled 2015-02-06 (×4): qty 1

## 2015-02-06 MED ORDER — FUROSEMIDE 40 MG PO TABS
40.0000 mg | ORAL_TABLET | Freq: Two times a day (BID) | ORAL | Status: DC
  Administered 2015-02-06 – 2015-02-07 (×3): 40 mg via ORAL
  Filled 2015-02-06 (×3): qty 1

## 2015-02-06 NOTE — Care Management Note (Addendum)
Case Management Note  Patient Details  Name: [name] [name] MRN: [id number] Date of Birth: August 23, 1956  Subjective/Objective:   CM received consult for :Needs bidil as outpatient, can he get coverage?  Pt has insurance. CM will be unable to assist with medications at this time. CM did call [facility] and they have a coupon card that discounts the price for a pt with insurance for 20.00. CM also called the [facility] for the price and it is available will have to bill insurance. Benefits check in process.  Per Heart Failure Navigator pt will f/u at the HF Clinic.                 Action/Plan: No further needs at this time.   Expected Discharge Date:                  Expected Discharge Plan:  Home Self Care In-House Referral:     Discharge planning Services  CM Consult  Post Acute Care Choice:    Choice offered to:     DME Arranged:    DME Agency:     HH Arranged:    [place] Agency:     Status of Service:     Medicare Important Message Given:    Date Medicare IM Given:    Medicare IM give by:    Date Additional Medicare IM Given:    Additional Medicare Important Message give by:     If discussed at [place] of Stay Meetings, dates discussed:    Additional Comments: 02-06-15 1644:  Per rep at [facility]:   Bidil: tier 2, $40 at retail 30 day supply   Generic isosorbide dinitrate: tier 1 $10 at retail 30 day supply  Generic hydralazine HCL: tier 1 $10 at retail; 30 day suppy   No auth required   Bethena Roys, RN 02/06/2015, 12:01 PM

## 2015-02-06 NOTE — Progress Notes (Signed)
Heart Failure Navigator Consult Note  Presentation: [name] [name] is a 60 yo with history of HTN, smoking, and ETOH abuse presents for CHF clinic evaluation. Patient had an admission to [facility] in 2009 for chest pain. At that time, echo showed EF 35-40% with coronary CTA showing possible obstructive disease in the left circumflex. He did not have cardiac cath. He had limited medical contact up until recently when he began to see [name] [name]. He is on meds for HTN and hyperlipidemia now. He saw [name] [name] recently, reporting 1-2 weeks of ankle edema. He was sent for echo and was noted to have a mildly dilated LV with severe systolic dysfunction, EF 66%. On further questioning, he has had 3-4 weeks of exertional dyspnea. He is short of breath after walking 1/2 block or walking up a flight of steps. He has had 3 pillow orthopnea for a month. No palpitations, lightheadedness, or syncope. 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 He has had no chest pain at rest. It is getting very hard for him to do his job. Of note, he smokes 1 ppd and drinks 1 pint liquor/day. He used to do cocaine but no longer uses it. He was recently started on Crestor for elevated LDL.    Past Medical History  Diagnosis Date  . Hypertension   . Coronary artery disease     History   Social History  . Marital Status: Single    Spouse Name: N/A  . Number of Children: N/A  . Years of Education: N/A   Social History Main Topics  . Smoking status: Current Every Day Smoker  . Smokeless tobacco: Not on file  . Alcohol Use: Yes  . Drug Use: Not on file  . Sexual Activity: Not on file   Other Topics Concern  . None   Social History Narrative    ECHO:Study Conclusions--01/30/15  - Left ventricle: The cavity size was severely dilated. Wall thickness was increased in a pattern of mild LVH. - Aortic valve: There was mild  regurgitation. - Mitral valve: There was mild regurgitation. - Left atrium: The atrium was mildly dilated. - Right ventricle: The cavity size was mildly to moderately dilated. Systolic function was moderately reduced. - Right atrium: The atrium was mildly dilated.  Transthoracic echocardiography. M-mode, complete 2D, spectral Doppler, and color Doppler. Birthdate: Patient birthdate: 10-16-1955. Age: Patient is 59 yr old. Sex: Gender: male. BMI: 26.2 kg/m^2. Blood pressure:   131/92 Patient status: Outpatient. Study date: Study date: 01/30/2015. Study time: 02:27 PM. Location: Echo laboratory  Cardiac Catheterization-5/1/0/16  Right Heart Pressures 1. Elevated left and right heart filling pressures with low cardiac output (CI 1.4).  2. Nonobstructive CAD suggesting nonischemic cardiomyopathy.  3. I will admit [name] [name] given low output for CHF treatment. Not good candidate for advanced therapies or home milrinone given h/o substance abuse and ongoing ETOH abuse. Will try to treat with oral medications for now, add digoxin Hemodynamics (mmHg) RA mean 10 RV 51/21 PA 49/28, mean 35 PCWP mean 25 LV 111/20 AO 111/62  Oxygen saturations: PA 51% AO 94%  Cardiac Output (Fick) 2.84  Cardiac Index (Fick) 1.41 PVR 3.5 WU  BNP    Component Value Date/Time   BNP 2342.8* 02/04/2015 1650    ProBNP    Component Value Date/Time   PROBNP 40.0 06/23/2008 1245     Education Assessment and Provision:  Detailed education and instructions provided on heart failure disease management including the following:  Signs and symptoms of Heart Failure When to call the physician Importance of daily weights Low sodium diet Fluid restriction Medication management Anticipated future follow-up appointments  Patient education given on each of the above topics.  Patient acknowledges understanding and acceptance of all instructions.  I spoke with [name]. [name] regarding his  HF.  He does not have a scale and I will bring him one for home use.  I emphasized the importance of daily weights.  We discussed a low sodium diet and high sodium foods to avoid.  I reinforced signs and symptoms of HF and when to call the physician.  He says that he "thinks" that he can get medications at discharge and I explained how important it is to get and take all prescribed medications.  I will plan to return tomorrow with scale for his home use and to reinforce HF education.    Education Materials:  "Living Better With Heart Failure" Booklet, Daily Weight Tracker Tool   High Risk Criteria for Readmission and/or Poor Patient Outcomes:   EF <30%-yes 10% per note  2 or more admissions in 6 months-No  Difficult social situation- No--lives with son and roommate  Demonstrates medication noncompliance- ? Denies    Barriers of Care:  Knowledge and compliance, ETOH abuse  Discharge Planning:   Plans to return home with roommates and son.  I will collaborate with Care manager regarding his medications at discharge.  He will follow in the AHF Clinic after discharge.

## 2015-02-06 NOTE — Progress Notes (Signed)
Patient ID: [name] [name], male   DOB: 10-24-1955, 59 y.o.   MRN: [id number]   SUBJECTIVE: Admitted yesterday cath showed cardiac index 1.4.  Diuresed, cardiac meds adjusted.  Says he feels better this morning, diuresed well overnight. CVP 8 today.  Pending co-ox (was 67% yesterday).   RHC/LHC:  RA mean 10 RV 51/21 PA 49/28, mean 35 PCWP mean 25 LV 111/20 AO 111/62 Oxygen saturations: PA 51% AO 94% Cardiac Output (Fick) 2.84  Cardiac Index (Fick) 1.41 PVR 3.5 WU Nonobstructive CAD  Scheduled Meds: . aspirin EC  81 mg Oral Daily  . digoxin  0.125 mg Oral Daily  . folic acid  1 mg Oral Daily  . furosemide  40 mg Intravenous BID  . heparin  5,000 Units Subcutaneous 3 times per day  . isosorbide-hydrALAZINE  1 tablet Oral TID  . lisinopril  5 mg Oral BID  . multivitamin with minerals  1 tablet Oral Daily  . nicotine  14 mg Transdermal Daily  . potassium chloride  20 mEq Oral BID  . rosuvastatin  20 mg Oral q1800  . sodium chloride  10-40 mL Intracatheter Q12H  . sodium chloride  3 mL Intravenous Q12H  . spironolactone  25 mg Oral Daily  . thiamine  100 mg Oral Daily   Continuous Infusions:  PRN Meds:.sodium chloride, acetaminophen, guaiFENesin-dextromethorphan, LORazepam, ondansetron (ZOFRAN) IV, sodium chloride, sodium chloride    Filed Vitals:   02/05/15 2257 02/06/15 0028 02/06/15 0312 02/06/15 0500  BP: 111/81 111/81 112/81   Pulse:   96   Temp: 98.7 F (37.1 C)     TempSrc: Oral     Resp: 21 22    Height:      Weight:    158 lb 6.4 oz (71.85 kg)  SpO2: 99%       Intake/Output Summary (Last 24 hours) at 02/06/15 0757 Last data filed at 02/06/15 0335  Gross per 24 hour  Intake    980 ml  Output   2900 ml  Net  -1920 ml    LABS: Basic Metabolic Panel:  Recent Labs  02/04/15 1650 02/05/15 0320 02/06/15 0426  NA 139 140 140  K 3.8 4.0 3.7  CL 99* 104 102  CO2 29 26 28   GLUCOSE 91 91 101*  BUN 13 12 11   CREATININE 1.11 1.07 1.05  CALCIUM 9.2  8.9 9.0  MG 1.9  --   --    Liver Function Tests:  Recent Labs  02/04/15 1650  AST 20  ALT 14*  ALKPHOS 104  BILITOT 2.0*  PROT 6.0*  ALBUMIN 3.5   No results for input(s): LIPASE, AMYLASE in the last 72 hours. CBC:  Recent Labs  02/04/15 1650  WBC 4.4  NEUTROABS 2.0  HGB 15.9  HCT 47.8  MCV 99.6  PLT 183   Cardiac Enzymes: No results for input(s): CKTOTAL, CKMB, CKMBINDEX, TROPONINI in the last 72 hours. BNP: Invalid input(s): POCBNP D-Dimer: No results for input(s): DDIMER in the last 72 hours. Hemoglobin A1C: No results for input(s): HGBA1C in the last 72 hours. Fasting Lipid Panel: No results for input(s): CHOL, HDL, LDLCALC, TRIG, CHOLHDL, LDLDIRECT in the last 72 hours. Thyroid Function Tests:  Recent Labs  02/04/15 1650  TSH 0.606   Anemia Panel: No results for input(s): VITAMINB12, FOLATE, FERRITIN, TIBC, IRON, RETICCTPCT in the last 72 hours.  RADIOLOGY: No results found.  PHYSICAL EXAM General: NAD Neck: JVP 7-8 cm, no thyromegaly or thyroid nodule.  Lungs: Clear to  auscultation bilaterally with normal respiratory effort. CV: Nondisplaced PMI.  Heart regular S1/S2, +S3, 1/6 HSM apex.  No peripheral edema.   Abdomen: Soft, nontender, no hepatosplenomegaly, no distention.  Neurologic: Alert and oriented x 3.  Psych: Normal affect. Extremities: No clubbing or cyanosis.   TELEMETRY: Reviewed telemetry pt in NSR  ASSESSMENT AND PLAN: 59 yo with history of heavy ETOH was found to have EF 10% by echo.  He had exertional dyspnea and chest pain, taken for LHC/RHC 5/10 showing nonobstructive CAD, elevated filling pressures (but not marked elevation), and low cardiac index.  1. Acute on chronic systolic CHF: EF 80% by echo.  LHC with nonobstructive CAD.  HIV negative.  Heavy ETOH use.  Suspect this may be ETOH cardiomyopathy.  Filling pressures moderately elevated on RHC with low cardiac index 1.4.  At this time with active ETOH abuse, he is poor  candidate for advanced therapies or home milrinone.  Will try to work with po medications and ETOH abstinence. Co-ox 67% yesterday on current meds. CVP 8.  - Needs to stop drinking.  We have discussed this extensively.  - Can transition to Lasix 40 mg po bid.  - Continue lisinopril and spironolactone, increase Bidil to 1 tab tid.  - Added digoxin.  - Needs co-ox this morning.  - No beta blocker yet given low output.  If co-ox continues to look good, may start low dose before discharge.   2. ETOH abuse: No signs of withdrawal. As above, counseled to abstain.  3. Ambulate in halls today, probably home tomorrow.   [name] 02/06/2015 7:57 AM

## 2015-02-07 LAB — CBC
HEMATOCRIT: 46.3 % (ref 39.0–52.0)
Hemoglobin: 15.8 g/dL (ref 13.0–17.0)
MCH: 33.5 pg (ref 26.0–34.0)
MCHC: 34.1 g/dL (ref 30.0–36.0)
MCV: 98.3 fL (ref 78.0–100.0)
PLATELETS: 180 10*3/uL (ref 150–400)
RBC: 4.71 MIL/uL (ref 4.22–5.81)
RDW: 13.6 % (ref 11.5–15.5)
WBC: 4.4 10*3/uL (ref 4.0–10.5)

## 2015-02-07 LAB — BASIC METABOLIC PANEL
Anion gap: 11 (ref 5–15)
BUN: 10 mg/dL (ref 6–20)
CO2: 25 mmol/L (ref 22–32)
CREATININE: 1.1 mg/dL (ref 0.61–1.24)
Calcium: 8.7 mg/dL — ABNORMAL LOW (ref 8.9–10.3)
Chloride: 99 mmol/L — ABNORMAL LOW (ref 101–111)
GLUCOSE: 84 mg/dL (ref 65–99)
Potassium: 3.7 mmol/L (ref 3.5–5.1)
SODIUM: 135 mmol/L (ref 135–145)

## 2015-02-07 LAB — CARBOXYHEMOGLOBIN
CARBOXYHEMOGLOBIN: 1.1 % (ref 0.5–1.5)
METHEMOGLOBIN: 0.9 % (ref 0.0–1.5)
O2 Saturation: 67.1 %
Total hemoglobin: 16.2 g/dL (ref 13.5–18.0)

## 2015-02-07 MED ORDER — CARVEDILOL 3.125 MG PO TABS: 3.1250 mg | ORAL_TABLET | Freq: Two times a day (BID) | ORAL | Status: DC

## 2015-02-07 MED ORDER — FUROSEMIDE 20 MG PO TABS: 40.0000 mg | ORAL_TABLET | Freq: Two times a day (BID) | ORAL | Status: DC

## 2015-02-07 MED ORDER — CARVEDILOL 3.125 MG PO TABS
3.1250 mg | ORAL_TABLET | Freq: Two times a day (BID) | ORAL | Status: DC
  Administered 2015-02-07: 3.125 mg via ORAL
  Filled 2015-02-07: qty 1

## 2015-02-07 MED ORDER — DIGOXIN 125 MCG PO TABS: 0.1250 mg | ORAL_TABLET | Freq: Every day | ORAL | Status: DC

## 2015-02-07 MED ORDER — POTASSIUM CHLORIDE CRYS ER 20 MEQ PO TBCR: 20.0000 meq | EXTENDED_RELEASE_TABLET | Freq: Every day | ORAL | Status: DC

## 2015-02-07 MED ORDER — SPIRONOLACTONE 25 MG PO TABS: 25.0000 mg | ORAL_TABLET | Freq: Every day | ORAL | Status: DC

## 2015-02-07 MED ORDER — ASPIRIN 81 MG PO TBEC: 81.0000 mg | DELAYED_RELEASE_TABLET | Freq: Every day | ORAL | Status: DC

## 2015-02-07 MED ORDER — LISINOPRIL 10 MG PO TABS: 5.0000 mg | ORAL_TABLET | Freq: Two times a day (BID) | ORAL | Status: DC

## 2015-02-07 MED ORDER — ISOSORB DINITRATE-HYDRALAZINE 20-37.5 MG PO TABS: 1.0000 | ORAL_TABLET | Freq: Three times a day (TID) | ORAL | Status: DC

## 2015-02-07 NOTE — Discharge Summary (Signed)
Advanced Heart Failure Team  Discharge Summary   Patient ID: [name] [name] MRN: [id number], DOB/AGE: [birth year] 60 y.o. Admit date: 02/04/2015 D/C date:     02/07/2015   Primary Discharge Diagnoses:  1. A/C Systolic Heart Failure EF 10% 2. ETOH Abuse   Hospital Course:  [name] is a 59 year old with history of ETOH was found to have EF 10% by echo. He had exertional dyspnea and chest pain, taken for LHC/RHC 5/10 showing nonobstructive CAD, elevated filling pressures (but not marked elevation), and low cardiac index. Unfortunately he is not a candidate for advanced therapies/home inotropes due to ongoing ETOH abuse. He was admitted to optimize HF/HFmeds.  He was diuresed with IV lasix and transitioned to po lasix. Over all he diuresed 9 pounds. He will continue on low dose carvedilol, lisinopril, bidil, and spironolactone.     He received extensive education on the importance of alcohol abstinence, medication compliance, daily weights, and low salt. He will continue to be followed closely in the HF clinic and has an appointment next week. He was provided a 30 day free card for Bidil. Prior to discharge he was evaluated by Dr Aundra Dubin and deeemed stable for discharge.   RHC/LHC:  02/04/2015  RA mean 10 RV 51/21 PA 49/28, mean 35 PCWP mean 25 LV 111/20 AO 111/62 Oxygen saturations: PA 51% AO 94% Cardiac Output (Fick) 2.84  Cardiac Index (Fick) 1.41 PVR 3.5 WU Nonobstructive CAD   Discharge Weight Range: 158 pounds Discharge Vitals: Blood pressure 102/71, pulse 102, temperature 98.5 F (36.9 C), temperature source Oral, resp. rate 20, height 6\' 1"  (1.854 m), weight 158 lb 6.4 oz (71.85 kg), SpO2 99 %.  Labs: Lab Results  Component Value Date   WBC 4.4 02/07/2015   HGB 15.8 02/07/2015   HCT 46.3 02/07/2015   MCV 98.3 02/07/2015   PLT 180 02/07/2015    Recent Labs Lab 02/04/15 1650  02/07/15 0500  NA 139  < > 135  K 3.8  < > 3.7  CL 99*  < > 99*  CO2 29  < > 25  BUN 13   < > 10  CREATININE 1.11  < > 1.10  CALCIUM 9.2  < > 8.7*  PROT 6.0*  --   --   BILITOT 2.0*  --   --   ALKPHOS 104  --   --   ALT 14*  --   --   AST 20  --   --   GLUCOSE 91  < > 84  < > = values in this interval not displayed. Lab Results  Component Value Date   CHOL 196 01/27/2015   HDL 53 01/27/2015   LDLCALC 121* 01/27/2015   TRIG 109 01/27/2015   BNP (last 3 results)  Recent Labs  02/04/15 1650  BNP 2342.8*    ProBNP (last 3 results) No results for input(s): PROBNP in the last 8760 hours.   Diagnostic Studies/Procedures   No results found.  Discharge Medications     Medication List    TAKE these medications        aspirin 81 MG EC tablet  Take 1 tablet (81 mg total) by mouth daily.     carvedilol 3.125 MG tablet  Commonly known as:  COREG  Take 1 tablet (3.125 mg total) by mouth 2 (two) times daily with a meal.     digoxin 0.125 MG tablet  Commonly known as:  LANOXIN  Take 1 tablet (0.125 mg total) by  mouth daily.     furosemide 20 MG tablet  Commonly known as:  LASIX  Take 2 tablets (40 mg total) by mouth 2 (two) times daily.     isosorbide-hydrALAZINE 20-37.5 MG per tablet  Commonly known as:  BIDIL  Take 1 tablet by mouth 3 (three) times daily.     lisinopril 10 MG tablet  Commonly known as:  PRINIVIL,ZESTRIL  Take 0.5 tablets (5 mg total) by mouth 2 (two) times daily.     potassium chloride SA 20 MEQ tablet  Commonly known as:  K-DUR,KLOR-CON  Take 1 tablet (20 mEq total) by mouth daily.     rosuvastatin 20 MG tablet  Commonly known as:  CRESTOR  Take 1 tablet (20 mg total) by mouth daily.     spironolactone 25 MG tablet  Commonly known as:  ALDACTONE  Take 1 tablet (25 mg total) by mouth daily.     traMADol 50 MG tablet  Commonly known as:  ULTRAM  Take 1 tablet (50 mg total) by mouth every 8 (eight) hours as needed.        Disposition   The patient will be discharged in stable condition to home.     Discharge  Instructions    Contraindication to ACEI at discharge    Complete by:  As directed      Diet - low sodium heart healthy    Complete by:  As directed      Heart Failure patients record your daily weight using the same scale at the same time of day    Complete by:  As directed      Increase activity slowly    Complete by:  As directed           Follow-up Information    Follow up with Glori Bickers, MD On 02/14/2015.   Specialty:  Cardiology   Why:  at [place] information:   [place] [address] [phone]         Duration of Discharge Encounter: Greater than 35 minutes   Signed, CLEGG,AMY NP-C  02/07/2015, 9:53 AM

## 2015-02-07 NOTE — Care Management (Signed)
Medicare Important Message given? YES   (If response is "NO", the following Medicare IM given date fields will be blank)   Date Medicare IM given:   Medicare IM given by: Graves-Bigelow, Benjaman Artman  

## 2015-02-07 NOTE — Progress Notes (Signed)
Patient ID: [name] [name], male   DOB: 1956/04/21, 59 y.o.   MRN: [id number]   SUBJECTIVE: Admitted after cath showed cardiac index 1.4.  Diuresed, cardiac meds adjusted.  Says he feels better this morning, diuresed well in the hospital and now on po meds.  CVP 7 today. Co-ox 67%.  Getting all meds, not lightheaded.    RHC/LHC:  RA mean 10 RV 51/21 PA 49/28, mean 35 PCWP mean 25 LV 111/20 AO 111/62 Oxygen saturations: PA 51% AO 94% Cardiac Output (Fick) 2.84  Cardiac Index (Fick) 1.41 PVR 3.5 WU Nonobstructive CAD  Scheduled Meds: . aspirin EC  81 mg Oral Daily  . carvedilol  3.125 mg Oral BID WC  . digoxin  0.125 mg Oral Daily  . folic acid  1 mg Oral Daily  . furosemide  40 mg Oral BID  . heparin  5,000 Units Subcutaneous 3 times per day  . isosorbide-hydrALAZINE  1 tablet Oral TID  . lisinopril  5 mg Oral BID  . multivitamin with minerals  1 tablet Oral Daily  . nicotine  14 mg Transdermal Daily  . potassium chloride  20 mEq Oral BID  . rosuvastatin  20 mg Oral q1800  . sodium chloride  10-40 mL Intracatheter Q12H  . sodium chloride  3 mL Intravenous Q12H  . spironolactone  25 mg Oral Daily  . thiamine  100 mg Oral Daily   Continuous Infusions:  PRN Meds:.sodium chloride, acetaminophen, guaiFENesin-dextromethorphan, LORazepam, ondansetron (ZOFRAN) IV, sodium chloride, sodium chloride    Filed Vitals:   02/06/15 1958 02/06/15 2000 02/07/15 0000 02/07/15 0510  BP: 94/62 94/62 105/79 102/71  Pulse:  93  102  Temp:  98.6 F (37 C)  98.5 F (36.9 C)  TempSrc:  Oral  Oral  Resp:  33 21 20  Height:      Weight:      SpO2:  100%  99%    Intake/Output Summary (Last 24 hours) at 02/07/15 0915 Last data filed at 02/06/15 2125  Gross per 24 hour  Intake    240 ml  Output    425 ml  Net   -185 ml    LABS: Basic Metabolic Panel:  Recent Labs  02/04/15 1650  02/06/15 0426 02/07/15 0500  NA 139  < > 140 135  K 3.8  < > 3.7 3.7  CL 99*  < > 102 99*  CO2  29  < > 28 25  GLUCOSE 91  < > 101* 84  BUN 13  < > 11 10  CREATININE 1.11  < > 1.05 1.10  CALCIUM 9.2  < > 9.0 8.7*  MG 1.9  --   --   --   < > = values in this interval not displayed. Liver Function Tests:  Recent Labs  02/04/15 1650  AST 20  ALT 14*  ALKPHOS 104  BILITOT 2.0*  PROT 6.0*  ALBUMIN 3.5   No results for input(s): LIPASE, AMYLASE in the last 72 hours. CBC:  Recent Labs  02/04/15 1650 02/07/15 0500  WBC 4.4 4.4  NEUTROABS 2.0  --   HGB 15.9 15.8  HCT 47.8 46.3  MCV 99.6 98.3  PLT 183 180   Cardiac Enzymes: No results for input(s): CKTOTAL, CKMB, CKMBINDEX, TROPONINI in the last 72 hours. BNP: Invalid input(s): POCBNP D-Dimer: No results for input(s): DDIMER in the last 72 hours. Hemoglobin A1C: No results for input(s): HGBA1C in the last 72 hours. Fasting Lipid Panel: No results for  input(s): CHOL, HDL, LDLCALC, TRIG, CHOLHDL, LDLDIRECT in the last 72 hours. Thyroid Function Tests:  Recent Labs  02/04/15 1650  TSH 0.606   Anemia Panel: No results for input(s): VITAMINB12, FOLATE, FERRITIN, TIBC, IRON, RETICCTPCT in the last 72 hours.  RADIOLOGY: No results found.  PHYSICAL EXAM General: NAD Neck: JVP 7 cm, no thyromegaly or thyroid nodule.  Lungs: Clear to auscultation bilaterally with normal respiratory effort. CV: Nondisplaced PMI.  Heart regular S1/S2, no S3, 1/6 HSM apex.  No peripheral edema.   Abdomen: Soft, nontender, no hepatosplenomegaly, no distention.  Neurologic: Alert and oriented x 3.  Psych: Normal affect. Extremities: No clubbing or cyanosis.   TELEMETRY: Reviewed telemetry pt in NSR  ASSESSMENT AND PLAN: 59 yo with history of heavy ETOH was found to have EF 10% by echo.  He had exertional dyspnea and chest pain, taken for LHC/RHC 5/10 showing nonobstructive CAD, elevated filling pressures (but not marked elevation), and low cardiac index.  1. Acute on chronic systolic CHF: EF 77% by echo.  LHC with nonobstructive  CAD.  HIV negative.  Heavy ETOH use.  Suspect this may be ETOH cardiomyopathy.  Filling pressures moderately elevated on RHC with low cardiac index 1.4.  At this time with active ETOH abuse, he is poor candidate for advanced therapies or home milrinone.  Will try to work with po medications and ETOH abstinence. Co-ox 67% today on current meds. CVP 7.  Tolerating meds without lightheadedness, walked halls.  - Needs to stop drinking.  We have discussed this extensively.  - Continue Lasix 40 mg po bid.  - Continue lisinopril, Bidil, and spironolactone.  - Added digoxin.  - Will put him on low dose Coreg today, watch until after 1st dose to make sure he tolerates.  2. ETOH abuse: No signs of withdrawal. As above, counseled to abstain.  3. Ambulate today.  Can go home after he gets Coreg dose to make sure he tolerates.  Needs followup in CHF clinic with BMET in 1 week.  Needs medication education.  Needs home health arranged.  Meds: Lasix 40 mg po bid, spironolactone 25 daily, ASA 81, Crestor 20 daily, lisinopril 5 mg bid, Bidil 1 tab tid, Coreg 3.125 mg bid, digoxin 0.125 daily.   [name] 02/07/2015 9:15 AM

## 2015-02-14 ENCOUNTER — Ambulatory Visit (HOSPITAL_COMMUNITY)
Admission: RE | Admit: 2015-02-14 | Discharge: 2015-02-14 | Disposition: A | Discharge status: Home / Self Care | Payer: 59 | Source: Ambulatory Visit | Attending: Internal Medicine | Admitting: Internal Medicine

## 2015-02-14 ENCOUNTER — Encounter (HOSPITAL_COMMUNITY): Payer: Self-pay

## 2015-02-14 VITALS — BP 114/68 | HR 72 | Wt 158.2 lb

## 2015-02-14 DIAGNOSIS — I2510 Atherosclerotic heart disease of native coronary artery without angina pectoris: Secondary | ICD-10-CM | POA: Insufficient documentation

## 2015-02-14 DIAGNOSIS — Z8249 Family history of ischemic heart disease and other diseases of the circulatory system: Secondary | ICD-10-CM | POA: Insufficient documentation

## 2015-02-14 DIAGNOSIS — R600 Localized edema: Secondary | ICD-10-CM | POA: Diagnosis not present

## 2015-02-14 DIAGNOSIS — E785 Hyperlipidemia, unspecified: Secondary | ICD-10-CM | POA: Diagnosis not present

## 2015-02-14 DIAGNOSIS — Z7982 Long term (current) use of aspirin: Secondary | ICD-10-CM | POA: Insufficient documentation

## 2015-02-14 DIAGNOSIS — Z720 Tobacco use: Secondary | ICD-10-CM

## 2015-02-14 DIAGNOSIS — I429 Cardiomyopathy, unspecified: Secondary | ICD-10-CM | POA: Diagnosis not present

## 2015-02-14 DIAGNOSIS — Z823 Family history of stroke: Secondary | ICD-10-CM | POA: Diagnosis not present

## 2015-02-14 DIAGNOSIS — I10 Essential (primary) hypertension: Secondary | ICD-10-CM | POA: Diagnosis not present

## 2015-02-14 DIAGNOSIS — Z79899 Other long term (current) drug therapy: Secondary | ICD-10-CM | POA: Insufficient documentation

## 2015-02-14 DIAGNOSIS — F17200 Nicotine dependence, unspecified, uncomplicated: Secondary | ICD-10-CM

## 2015-02-14 DIAGNOSIS — M79605 Pain in left leg: Secondary | ICD-10-CM | POA: Insufficient documentation

## 2015-02-14 DIAGNOSIS — I5022 Chronic systolic (congestive) heart failure: Secondary | ICD-10-CM | POA: Diagnosis not present

## 2015-02-14 DIAGNOSIS — F17210 Nicotine dependence, cigarettes, uncomplicated: Secondary | ICD-10-CM | POA: Diagnosis not present

## 2015-02-14 DIAGNOSIS — F1010 Alcohol abuse, uncomplicated: Secondary | ICD-10-CM | POA: Insufficient documentation

## 2015-02-14 LAB — BASIC METABOLIC PANEL
Anion gap: 9 (ref 5–15)
BUN: 17 mg/dL (ref 6–20)
CHLORIDE: 99 mmol/L — AB (ref 101–111)
CO2: 27 mmol/L (ref 22–32)
Calcium: 10.1 mg/dL (ref 8.9–10.3)
Creatinine, Ser: 1.19 mg/dL (ref 0.61–1.24)
GFR calc non Af Amer: >60 mL/min (ref >60)
Glucose, Bld: 114 mg/dL — ABNORMAL HIGH (ref 65–99)
POTASSIUM: 4.8 mmol/L (ref 3.5–5.1)
SODIUM: 135 mmol/L (ref 135–145)

## 2015-02-14 LAB — DIGOXIN LEVEL: Digoxin Level: 0.3 ng/mL — ABNORMAL LOW (ref 0.8–2.0)

## 2015-02-14 LAB — BRAIN NATRIURETIC PEPTIDE: B NATRIURETIC PEPTIDE 5: 376.2 pg/mL — AB (ref 0.0–100.0)

## 2015-02-14 MED ORDER — LISINOPRIL 10 MG PO TABS: ORAL_TABLET | ORAL | Status: DC

## 2015-02-14 MED ORDER — FUROSEMIDE 20 MG PO TABS: 40.0000 mg | ORAL_TABLET | Freq: Every day | ORAL | Status: DC

## 2015-02-14 NOTE — Progress Notes (Signed)
Patient ID: [name] [name], male   DOB: 1956-09-03, 59 y.o.   MRN: [id number] PCP: Dr Doreene Burke Primary Cardiologist: Dr Aundra Dubin.   HPI: [name] is a 59 year old with history of ETOH was found to have EF 10% by echo. He had exertional dyspnea and chest pain, taken for LHC/RHC 5/10 showing nonobstructive CAD, elevated filling pressures (but not marked elevation), and low cardiac index. Unfortunately he is not a candidate for advanced therapies/home inotropes due to ETOH abuse. He was admitted to optimize HF/HFmeds. He was diuresed with IV lasix and transitioned to po lasix. Over all he diuresed 9 pounds. He continued on low dose carvedilol, lisinopril, bidil, and spironolactone.   He returns for post hospital follow up. Overall feeling fair. Remains SOB with exertion. SOB walking up steps and inclines.  Has to walk slowly in the grocery store. Has pain in R and LLE when he walks. Weight at home 157 pounds. Appetite improved. Taking all medications. Smokes 1/2 pack per day. Has not had any alcohol to drink in 2 weeks.    RHC/LHC:  02/04/2015  RA mean 10 RV 51/21 PA 49/28, mean 35 PCWP mean 25 LV 111/20 AO 111/62 Oxygen saturations: PA 51% AO 94% Cardiac Output (Fick) 2.84  Cardiac Index (Fick) 1.41 PVR 3.5 WU Nonobstructive CAD  Labs (5/16): K 3.8, creatinine 0.94, HCT 48.4, LDL 121, HDL 50  PMH: 1. Low back pain 2. HTN 3. Hyperlipidemia 4. H/o cocaine abuse: Not active. 5. ETOH abuse: 1 pint/day 6. CAD: Coronary CT angiogram in 9/09 showed possible significant obstructive disease in the circumflex. He did not have cardiac cath.  7. Cardiomyopathy: Echo (9/09) with EF 35-40%. Echo (5/16) with EF 10%, mild LV dilation, grade II diastolic dysfunction, mildly dilated RV with moderately decreased systolic function. LHC/RHC (5/16): Nonobstructive coronary disease, mean RA 10, PA 49/28, mean PCWP 25, CI 1.4.  8. Active smoker  SH: Lives in [place], works as Dealer, lives with  son, smokes 1 ppd, drank 1 pint/day liquor => quit 5/16, prior cocaine use.   FH: Brother with MI in his 24s, another brother with ?SCD, mother with CVA.   ROS: All systems reviewed and negative except as per HPI.   Current Outpatient Prescriptions  Medication Sig Dispense Refill  . aspirin EC 81 MG EC tablet Take 1 tablet (81 mg total) by mouth daily. 30 tablet 6  . carvedilol (COREG) 3.125 MG tablet Take 1 tablet (3.125 mg total) by mouth 2 (two) times daily with a meal. 60 tablet 6  . digoxin (LANOXIN) 0.125 MG tablet Take 1 tablet (0.125 mg total) by mouth daily. 30 tablet 6  . furosemide (LASIX) 20 MG tablet Take 2 tablets (40 mg total) by mouth 2 (two) times daily. 120 tablet 3  . isosorbide-hydrALAZINE (BIDIL) 20-37.5 MG per tablet Take 1 tablet by mouth 3 (three) times daily. 90 tablet 6  . lisinopril (PRINIVIL,ZESTRIL) 10 MG tablet Take 0.5 tablets (5 mg total) by mouth 2 (two) times daily. 30 tablet 3  . potassium chloride SA (K-DUR,KLOR-CON) 20 MEQ tablet Take 1 tablet (20 mEq total) by mouth daily. 30 tablet 6  . rosuvastatin (CRESTOR) 20 MG tablet Take 1 tablet (20 mg total) by mouth daily. 90 tablet 3  . spironolactone (ALDACTONE) 25 MG tablet Take 1 tablet (25 mg total) by mouth daily. 30 tablet 3  . traMADol (ULTRAM) 50 MG tablet Take 1 tablet (50 mg total) by mouth every 8 (eight) hours as needed. (Patient taking differently:  Take 50 mg by mouth every 8 (eight) hours as needed for moderate pain. ) 60 tablet 0   No current facility-administered medications for this encounter.    Filed Vitals:   02/14/15 0841  BP: 114/68  Pulse: 72  Weight: 158 lb 4 oz (71.782 kg)  SpO2: 99%    PHYSICAL EXAM:  General:  Well appearing. No resp difficulty HEENT: normal Neck: supple. JVP flat. Carotids 2+ bilaterally; no bruits. No lymphadenopathy or thryomegaly appreciated.  Cor: PMI normal. Regular rate & rhythm. No rubs, gallops or murmurs.  Unable to palpate pedal pulses.  Lungs:  clear Abdomen: soft, nontender, nondistended. No hepatosplenomegaly. No bruits or masses. Good bowel sounds. Extremities: no cyanosis, clubbing, rash, edema Neuro: alert & orientedx3, cranial nerves grossly intact. Moves all 4 extremities w/o difficulty. Affect pleasant.  ASSESSMENT & PLAN: 1. Chronic Systolic HF: Nonischemic cardiomyopathy with low output per RHC/LHC.  Possible ETOH cardiomyopathy.  NYHA class III symptoms. Volume status looks ok.  Not candidate for advanced therapies or home milrinone due to ETOH abuse. - Cut back lasix to 40 mg once daily continue spiro 25 - Continue lisinopril 5 mg in am and increase to 10 mg in pm - Continue current dose bidil.  - Check BMET/BNP/Dig level.   - Reinforced daily weights, low salt diet, and limiting fluid intake to < 2 liters daily 2. ETOH Abuse: Former heavy drinker- Has not had any alcohol in 2 weeks.  3. Current Smoker: Encouraged to stop smoking.  4. Suspected PAD: R and LLE pain walking. Send for ABI.  5. CAD: Nonobstructive.  Continue ASA 81 and Crestor, needs lipids/LFTs in 2 months.   Refer to paramedicine and guide it.   CLEGG,AMY NP-C  9:57 AM   Patient seen with NP, agree with the above note.  Stable s/p RHC/LHC and hospitalization for CHF treatment.  He is not a candidate at this time for home milrinone or advanced therapies due to heavy ETOH abuse.  He has quit since he was in the hospital, off ETOH for about 2 wks.  As above, will increase lisinopril to 5 qam, 10 qpm.  Decrease Lasix to 40 daily.  Check ABIs and encouraged to cut back smoking.   [name] 02/16/2015

## 2015-02-14 NOTE — Patient Instructions (Signed)
Lab work today. We will call you with any abnormal/concerning results.  Otherwise, no news is good news!  DECREASE Lasix to 40mg  once daily.  INCREASE Lisinopril to 5mg  (1/2 tablet) in am and 10mg  (1 whole tablet) in pm.  We will schedule you for ABI's to rule out peripheral artery disease in your legs.  Follow up 2 weeks.  Do the following things EVERYDAY: 1) Weigh yourself in the morning before breakfast. Write it down and keep it in a log. 2) Take your medicines as prescribed 3) Eat low salt foods-Limit salt (sodium) to 2000 mg per day.  4) Stay as active as you can everyday 5) Limit all fluids for the day to less than 2 liters

## 2015-02-18 ENCOUNTER — Ambulatory Visit (HOSPITAL_COMMUNITY)
Admission: RE | Admit: 2015-02-18 | Discharge: 2015-02-18 | Disposition: A | Discharge status: Home / Self Care | Payer: 59 | Source: Ambulatory Visit | Attending: Internal Medicine | Admitting: Internal Medicine

## 2015-02-18 DIAGNOSIS — M79606 Pain in leg, unspecified: Secondary | ICD-10-CM | POA: Insufficient documentation

## 2015-02-18 DIAGNOSIS — I739 Peripheral vascular disease, unspecified: Secondary | ICD-10-CM | POA: Insufficient documentation

## 2015-02-18 DIAGNOSIS — R600 Localized edema: Secondary | ICD-10-CM | POA: Insufficient documentation

## 2015-02-18 NOTE — Progress Notes (Signed)
VASCULAR LAB PRELIMINARY  ARTERIAL  ABI completed: ABI within normal limits at rest. After 3 minutes of exercise patient experienced hip pain. Post exercise pressure on the left decreases slightly but remains in normal limits and on the right decreases to range of Mild arterial disease.     RIGHT    LEFT    PRESSURE WAVEFORM  PRESSURE WAVEFORM  BRACHIAL 117 Bi BRACHIAL 116 Bi     DP    AT 91 Bi AT 103 Bi  PT 112 Bi PT 114 Bi  Post Ex. PT 100 Bi PER 107 Bi  GREAT TOE  NA GREAT TOE  NA    RIGHT LEFT  Resting ABI 0.96 0.97  Post exercise ABI 0.87 0.93     Landry Mellow, RDMS, RVT  02/18/2015, 9:54 AM

## 2015-02-18 NOTE — Progress Notes (Signed)
LEFT TWO VOICE MESSAGES FOR PT TO STOP POTASSIUM. Pt here at the hospital today for ABI's Arbutus Leas will advise pt to stop potassium.

## 2015-02-21 ENCOUNTER — Encounter (HOSPITAL_COMMUNITY): Payer: Self-pay

## 2015-02-21 NOTE — Progress Notes (Signed)
Referred to [place] [facility] for weekly/biweekly visits to help with med management, diet and fluid compliance.  Form faxed, original given to HF Navigator Carole Binning, copy scanned into patient's chart for future reference.  Renee Pain

## 2015-02-28 ENCOUNTER — Ambulatory Visit (HOSPITAL_COMMUNITY)
Admission: RE | Admit: 2015-02-28 | Discharge: 2015-02-28 | Disposition: A | Discharge status: Home / Self Care | Payer: 59 | Source: Ambulatory Visit | Attending: Cardiology | Admitting: Cardiology

## 2015-02-28 VITALS — BP 104/62 | HR 80 | Wt 156.5 lb

## 2015-02-28 DIAGNOSIS — Z720 Tobacco use: Secondary | ICD-10-CM

## 2015-02-28 DIAGNOSIS — Z79899 Other long term (current) drug therapy: Secondary | ICD-10-CM | POA: Insufficient documentation

## 2015-02-28 DIAGNOSIS — F17210 Nicotine dependence, cigarettes, uncomplicated: Secondary | ICD-10-CM | POA: Insufficient documentation

## 2015-02-28 DIAGNOSIS — E785 Hyperlipidemia, unspecified: Secondary | ICD-10-CM | POA: Insufficient documentation

## 2015-02-28 DIAGNOSIS — I10 Essential (primary) hypertension: Secondary | ICD-10-CM | POA: Insufficient documentation

## 2015-02-28 DIAGNOSIS — Z7982 Long term (current) use of aspirin: Secondary | ICD-10-CM | POA: Diagnosis not present

## 2015-02-28 DIAGNOSIS — F1010 Alcohol abuse, uncomplicated: Secondary | ICD-10-CM | POA: Diagnosis not present

## 2015-02-28 DIAGNOSIS — I5022 Chronic systolic (congestive) heart failure: Secondary | ICD-10-CM | POA: Diagnosis not present

## 2015-02-28 DIAGNOSIS — Z823 Family history of stroke: Secondary | ICD-10-CM | POA: Diagnosis not present

## 2015-02-28 DIAGNOSIS — I2510 Atherosclerotic heart disease of native coronary artery without angina pectoris: Secondary | ICD-10-CM | POA: Insufficient documentation

## 2015-02-28 DIAGNOSIS — Z8249 Family history of ischemic heart disease and other diseases of the circulatory system: Secondary | ICD-10-CM | POA: Diagnosis not present

## 2015-02-28 DIAGNOSIS — F17200 Nicotine dependence, unspecified, uncomplicated: Secondary | ICD-10-CM

## 2015-02-28 DIAGNOSIS — I429 Cardiomyopathy, unspecified: Secondary | ICD-10-CM | POA: Diagnosis not present

## 2015-02-28 LAB — BASIC METABOLIC PANEL
Anion gap: 8 (ref 5–15)
BUN: 9 mg/dL (ref 6–20)
CO2: 27 mmol/L (ref 22–32)
Calcium: 9.7 mg/dL (ref 8.9–10.3)
Chloride: 99 mmol/L — ABNORMAL LOW (ref 101–111)
Creatinine, Ser: 1.06 mg/dL (ref 0.61–1.24)
GFR calc Af Amer: >60 mL/min (ref >60)
Glucose, Bld: 126 mg/dL — ABNORMAL HIGH (ref 65–99)
POTASSIUM: 4 mmol/L (ref 3.5–5.1)
Sodium: 134 mmol/L — ABNORMAL LOW (ref 135–145)

## 2015-02-28 MED ORDER — CARVEDILOL 3.125 MG PO TABS: 6.2500 mg | ORAL_TABLET | Freq: Two times a day (BID) | ORAL | Status: DC

## 2015-02-28 MED ORDER — FUROSEMIDE 20 MG PO TABS: 20.0000 mg | ORAL_TABLET | Freq: Every day | ORAL | Status: DC

## 2015-02-28 MED ORDER — LISINOPRIL 10 MG PO TABS: 5.0000 mg | ORAL_TABLET | Freq: Every day | ORAL | Status: DC

## 2015-02-28 NOTE — Patient Instructions (Addendum)
CHANGE Lisinopril to 10 mg tabs, take 1/2 tab daily CHANGE Furosemide to 20 mg tabs, take 1 tab daily CHANGE Carvedilol to 3.125 mg, take 2 tabs in the AM and 2 tabs in the PM CONTINUE Spironolactone 25 mg tabs, one tab daily CONTINUE Bidil, one tab three times per day CONTINUE Aspirin 81 mg tab, one tab daily CONTINUE Crestor 20 mg tab, one tab daily  Labs today  Your physician recommends that you schedule a follow-up appointment in: 3 weeks

## 2015-02-28 NOTE — Progress Notes (Signed)
Patient ID: [name] [name], male   DOB: [birth year], 59 y.o.   MRN: [id number] PCP: Dr Doreene Burke Primary Cardiologist: Dr Aundra Dubin.   HPI: [name] is a 59 year old with history of ETOH was found to have EF 10% by echo. He had exertional dyspnea and chest pain, taken for LHC/RHC 5/10 showing nonobstructive CAD, elevated filling pressures (but not marked elevation), and low cardiac index. Unfortunately he is not a candidate for advanced therapies/home inotropes due to ETOH abuse. He was admitted to optimize HF/HFmeds. He was diuresed with IV lasix and transitioned to po lasix. Over all he diuresed 9 pounds. He continued on low dose carvedilol, lisinopril, bidil, and spironolactone.   He returns for heart failure.  Last visit he was supposed to increase lisinopril to 10 mg in the evening. Brought all medications. SOB with exertion. Denies PND/Orthopnea. Weight at home 155 pounds.  Ongoing calf pain walking. Taking all medications. Limited reading ability. . Smokes 1/2 pack per day. Has not had any alcohol to drink in 2 weeks.    RHC/LHC:  02/04/2015  RA mean 10 RV 51/21 PA 49/28, mean 35 PCWP mean 25 LV 111/20 AO 111/62 Oxygen saturations: PA 51% AO 94% Cardiac Output (Fick) 2.84  Cardiac Index (Fick) 1.41 PVR 3.5 WU Nonobstructive CAD  Labs (5/16): K 3.8, creatinine 0.94, HCT 48.4, LDL 121, HDL 50 Labs 02/14/2015: k 4.8 Creatinine 1.19   PMH: 1. Low back pain 2. HTN 3. Hyperlipidemia 4. H/o cocaine abuse: Not active. 5. ETOH abuse: 1 pint/day 6. CAD: Coronary CT angiogram in 9/09 showed possible significant obstructive disease in the circumflex. He did not have cardiac cath.  7. Cardiomyopathy: Echo (9/09) with EF 35-40%. Echo (5/16) with EF 10%, mild LV dilation, grade II diastolic dysfunction, mildly dilated RV with moderately decreased systolic function. LHC/RHC (5/16): Nonobstructive coronary disease, mean RA 10, PA 49/28, mean PCWP 25, CI 1.4.  8. Active smoker 9. ABI - L  normal  R Mild arterial disease.   SH: Lives in [place], works as Dealer, lives with son, smokes 1 ppd, drank 1 pint/day liquor => quit 5/16, prior cocaine use.   FH: Brother with MI in his 69s, another brother with ?SCD, mother with CVA.   ROS: All systems reviewed and negative except as per HPI.   Current Outpatient Prescriptions  Medication Sig Dispense Refill  . aspirin EC 81 MG EC tablet Take 1 tablet (81 mg total) by mouth daily. 30 tablet 6  . carvedilol (COREG) 3.125 MG tablet Take 1 tablet (3.125 mg total) by mouth 2 (two) times daily with a meal. 60 tablet 6  . digoxin (LANOXIN) 0.125 MG tablet Take 1 tablet (0.125 mg total) by mouth daily. 30 tablet 6  . furosemide (LASIX) 20 MG tablet Take 2 tablets (40 mg total) by mouth daily. 60 tablet 3  . isosorbide-hydrALAZINE (BIDIL) 20-37.5 MG per tablet Take 1 tablet by mouth 3 (three) times daily. 90 tablet 6  . lisinopril (PRINIVIL,ZESTRIL) 10 MG tablet Take 5mg  (1/2 tablet) in am and 10mg  (1 tablet) in pm 45 tablet 3  . rosuvastatin (CRESTOR) 20 MG tablet Take 1 tablet (20 mg total) by mouth daily. 90 tablet 3  . spironolactone (ALDACTONE) 25 MG tablet Take 1 tablet (25 mg total) by mouth daily. 30 tablet 3  . traMADol (ULTRAM) 50 MG tablet Take 1 tablet (50 mg total) by mouth every 8 (eight) hours as needed. (Patient taking differently: Take 50 mg by mouth every 8 (eight) hours  as needed for moderate pain. ) 60 tablet 0  . potassium chloride SA (K-DUR,KLOR-CON) 20 MEQ tablet Take 1 tablet (20 mEq total) by mouth daily. (Patient not taking: Reported on 02/28/2015) 30 tablet 6   No current facility-administered medications for this encounter.    Filed Vitals:   02/28/15 0945  BP: 104/62  Pulse: 80  Weight: 156 lb 8 oz (70.988 kg)  SpO2: 98%    PHYSICAL EXAM:  General:  Well appearing. No resp difficulty HEENT: normal Neck: supple. JVP flat. Carotids 2+ bilaterally; no bruits. No lymphadenopathy or thryomegaly  appreciated.  Cor: PMI normal. Regular rate & rhythm. No rubs, gallops or murmurs.  Unable to palpate pedal pulses.  Lungs: clear Abdomen: soft, nontender, nondistended. No hepatosplenomegaly. No bruits or masses. Good bowel sounds. Extremities: no cyanosis, clubbing, rash, edema Neuro: alert & orientedx3, cranial nerves grossly intact. Moves all 4 extremities w/o difficulty. Affect pleasant.  ASSESSMENT & PLAN: 1. Chronic Systolic HF: Nonischemic cardiomyopathy with low output per RHC/LHC.  Possible ETOH cardiomyopathy.  NYHA class III symptoms.  Today he returns to HF clinic with his medications. After review he was not taking over half of HF meds correctly.  Today all HF medication bottles labeled to reflect what she should be taking.  - Carvedilol 3.125 mg in am and 6.25 mg in pm - Lasix 20 mg daily -Spironolactone 25 mg daily - Bidil 20-37.5 mg tid BMET today K 4.0 Creatinine 1.06  Not candidate for advanced therapies or home milrinone due to ETOH abuse. -  Reinforced daily weights, low salt diet, and limiting fluid intake to < 2 liters daily 2. ETOH Abuse: Former heavy drinker- Has not had any alcohol in 4 weeks. Congratulated.  3. Current Smoker: Encouraged to stop smoking.  4. Suspected PAD: R and LLE pain walking. ABI ok. Needs to stop smoking. .  5. CAD: Nonobstructive. No evidence of ischemia.  Continue ASA 81 and Crestor, needs lipids/LFTs in 2 months.   Barrier to outpatient management is limited reading ability.  Refer to paramedicine and guide it.  Follow up 3 weeks.   Yariela Tison NP-C  10:00 AM

## 2015-02-28 NOTE — Progress Notes (Signed)
Advanced Heart Failure Medication Review by a Pharmacist  Does the patient  feel that his/her medications are working for him/her?  yes  Has the patient been experiencing any side effects to the medications prescribed?  no  Does the patient measure his/her own blood pressure or blood glucose at home?  no   Does the patient have any problems obtaining medications due to transportation or finances?   no  Understanding of regimen: good Understanding of indications: good Potential of compliance: good    Pharmacist comments: Patient presents to heart failure clinic today and his medications were reviewed with a pharmacist. He brought all of his medications and pill boxes with him. A few discrepancies were noted - he has only been taking furosemide 20mg  daily, states that he was told to stop taking potassium, and has only been taking lisinopril 5mg  BID (was supposed to increase to 5mg  AM and 10mg  PM at last visit).   Megan E. Supple, Pharm.D Clinical Pharmacy Resident Pager: [phone] 02/28/2015 10:05 AM

## 2015-03-10 ENCOUNTER — Telehealth: Payer: Self-pay | Admitting: *Deleted

## 2015-03-10 NOTE — Telephone Encounter (Deleted)
I spoke to patient about lab work and elevated calcium. We will

## 2015-03-10 NOTE — Telephone Encounter (Signed)
I spoke to patient on phone to let him know about his lab work. Calcium was elevated 10.4mg /dl. We will repeat calcium level in 2 weeks at next visit.  Dr .Aundra Dubin also requested patient to get primary physician. Patient verbalized understanding.

## 2015-03-25 ENCOUNTER — Encounter (HOSPITAL_COMMUNITY): Payer: Self-pay

## 2015-03-25 ENCOUNTER — Ambulatory Visit (HOSPITAL_COMMUNITY)
Admission: RE | Admit: 2015-03-25 | Discharge: 2015-03-25 | Disposition: A | Discharge status: Home / Self Care | Payer: 59 | Source: Ambulatory Visit | Attending: Cardiology | Admitting: Cardiology

## 2015-03-25 VITALS — BP 102/68 | HR 76 | Wt 153.1 lb

## 2015-03-25 DIAGNOSIS — Z7982 Long term (current) use of aspirin: Secondary | ICD-10-CM | POA: Insufficient documentation

## 2015-03-25 DIAGNOSIS — E785 Hyperlipidemia, unspecified: Secondary | ICD-10-CM | POA: Diagnosis not present

## 2015-03-25 DIAGNOSIS — I10 Essential (primary) hypertension: Secondary | ICD-10-CM | POA: Diagnosis not present

## 2015-03-25 DIAGNOSIS — F17210 Nicotine dependence, cigarettes, uncomplicated: Secondary | ICD-10-CM | POA: Diagnosis not present

## 2015-03-25 DIAGNOSIS — Z79899 Other long term (current) drug therapy: Secondary | ICD-10-CM | POA: Diagnosis not present

## 2015-03-25 DIAGNOSIS — I5022 Chronic systolic (congestive) heart failure: Secondary | ICD-10-CM | POA: Diagnosis not present

## 2015-03-25 DIAGNOSIS — F17219 Nicotine dependence, cigarettes, with unspecified nicotine-induced disorders: Secondary | ICD-10-CM

## 2015-03-25 DIAGNOSIS — I429 Cardiomyopathy, unspecified: Secondary | ICD-10-CM | POA: Diagnosis not present

## 2015-03-25 DIAGNOSIS — I739 Peripheral vascular disease, unspecified: Secondary | ICD-10-CM | POA: Insufficient documentation

## 2015-03-25 DIAGNOSIS — I2510 Atherosclerotic heart disease of native coronary artery without angina pectoris: Secondary | ICD-10-CM | POA: Insufficient documentation

## 2015-03-25 MED ORDER — LISINOPRIL 10 MG PO TABS: 10.0000 mg | ORAL_TABLET | Freq: Every day | ORAL | Status: DC

## 2015-03-25 NOTE — Patient Instructions (Signed)
Increase Lisinopril to 10 mg (1 tab) daily  Labs in 7-10 days  Your physician recommends that you schedule a follow-up appointment in: 1 month

## 2015-03-25 NOTE — Progress Notes (Signed)
Patient ID: Deavin Forst, male   DOB: [birth year], 59 y.o.   MRN: [id number] PCP: Dr Doreene Burke Primary Cardiologist: Dr Aundra Dubin.   HPI: [name] is a 59 year old with history of ETOH was found to have EF 10% by echo. He had exertional dyspnea and chest pain, taken for LHC/RHC 5/10 showing nonobstructive CAD, elevated filling pressures (but not marked elevation), and low cardiac index. Unfortunately he is not a candidate for advanced therapies/home inotropes due to ETOH abuse. He was admitted to optimize HF/HFmeds. He was diuresed with IV lasix and transitioned to po lasix. Overall he diuresed 9 pounds. He continued on low dose carvedilol, lisinopril, bidil, and spironolactone.   Mr Buss returns for followup.  Overall, he feels like he is doing better than 3-4 months ago.  Weight is down 3 lbs.  He is not drinking at all, still smoking 1/2 ppd.  He is short of breath after walking about 1 block.  No orthopnea/PND.  Hip pain bothers him more than dyspnea, walks with cane.  Right calf claudication if he walks a "long distance."      RHC/LHC:  02/04/2015  RA mean 10 RV 51/21 PA 49/28, mean 35 PCWP mean 25 LV 111/20 AO 111/62 Oxygen saturations: PA 51% AO 94% Cardiac Output (Fick) 2.84  Cardiac Index (Fick) 1.41 PVR 3.5 WU Nonobstructive CAD  Labs (5/16): K 3.8, creatinine 0.94 => 1.19, HCT 48.4, LDL 121, HDL 50, digoxin 0.3, BNP 2343 => 367 Labs (6/16): K 4, creatinine 1.06  PMH: 1. Low back pain 2. HTN 3. Hyperlipidemia 4. H/o cocaine abuse: Not active. 5. ETOH abuse: 1 pint/day 6. CAD: Coronary CT angiogram in 9/09 showed possible significant obstructive disease in the circumflex. He did not have cardiac cath.  7. Cardiomyopathy: Echo (9/09) with EF 35-40%. Echo (5/16) with EF 10%, mild LV dilation, grade II diastolic dysfunction, mildly dilated RV with moderately decreased systolic function. LHC/RHC (5/16): Nonobstructive coronary disease, mean RA 10, PA 49/28, mean PCWP  25, CI 1.4.  8. Active smoker 9. PAD: ABI L normal, R mildly decreased.   SH: Lives in [place], works as Dealer, lives with son, smokes 1 ppd, drank 1 pint/day liquor => quit 5/16, prior cocaine use.   FH: Brother with MI in his 64s, another brother with ?SCD, mother with CVA.   ROS: All systems reviewed and negative except as per HPI.   Current Outpatient Prescriptions  Medication Sig Dispense Refill  . aspirin EC 81 MG EC tablet Take 1 tablet (81 mg total) by mouth daily. 30 tablet 6  . carvedilol (COREG) 3.125 MG tablet Take 2 tablets (6.25 mg total) by mouth 2 (two) times daily with a meal. 120 tablet 6  . digoxin (LANOXIN) 0.125 MG tablet Take 1 tablet (0.125 mg total) by mouth daily. 30 tablet 6  . furosemide (LASIX) 20 MG tablet Take 1 tablet (20 mg total) by mouth daily. 30 tablet 3  . isosorbide-hydrALAZINE (BIDIL) 20-37.5 MG per tablet Take 1 tablet by mouth 3 (three) times daily. 90 tablet 6  . lisinopril (PRINIVIL,ZESTRIL) 10 MG tablet Take 1 tablet (10 mg total) by mouth daily. 30 tablet 3  . rosuvastatin (CRESTOR) 20 MG tablet Take 1 tablet (20 mg total) by mouth daily. 90 tablet 3  . spironolactone (ALDACTONE) 25 MG tablet Take 1 tablet (25 mg total) by mouth daily. 30 tablet 3  . traMADol (ULTRAM) 50 MG tablet Take 1 tablet (50 mg total) by mouth every 8 (eight) hours as  needed. (Patient taking differently: Take 50 mg by mouth every 8 (eight) hours as needed for moderate pain. ) 60 tablet 0   No current facility-administered medications for this encounter.    Filed Vitals:   03/25/15 0901  BP: 102/68  Pulse: 76  Weight: 153 lb 1.9 oz (69.455 kg)  SpO2: 97%    PHYSICAL EXAM:  General:  Well appearing. No resp difficulty HEENT: normal Neck: supple. JVP flat. Carotids 2+ bilaterally; no bruits. No lymphadenopathy or thryomegaly appreciated.  Cor: PMI normal. Regular rate & rhythm. No rubs, gallops or murmurs.  Unable to palpate pedal pulses.  Lungs:  clear Abdomen: soft, nontender, nondistended. No hepatosplenomegaly. No bruits or masses. Good bowel sounds. Extremities: no cyanosis, clubbing, rash, edema Neuro: alert & orientedx3, cranial nerves grossly intact. Moves all 4 extremities w/o difficulty. Affect pleasant.  ASSESSMENT & PLAN: 1. Chronic Systolic HF: Nonischemic cardiomyopathy with low output per RHC/LHC in 5/16.  Possible ETOH cardiomyopathy.  NYHA class III symptoms.  Overall, he is doing better.  He is taking all his meds.  Euvolemic on exam.  - Continue Coreg 6.25 mg bid, Bidil 1 tab tid, spironolactone 25 daily. - Increase lisinopril to 10 mg daily.  BMET in 10 days.  - Continue digoxin 0.125 daily, recent digoxin level was ok.  - Continue Lasix 20 mg daily.  - Repeat echo in 9/16.  If he stays off ETOH and EF remains low, he will be candidate for ICD (narrow QRS, no CRT).   - At this point, not candidate for advanced therapies or home milrinone due to ETOH abuse. LV function may improve significantly over time off ETOH. 2. ETOH Abuse: Former heavy drinker, he has quit.   3. Current Smoker: Encouraged to stop smoking.  4. PAD: Mild claudication.  ABIs not markedly abnormal.  Encouraged him to quit smoking.  Continue ASA 81 and Crestor 20 daily with vascular disease.   5. Hyperlipidemia: Check lipids in 8/16, goal LDL < 70 with PAD.   5. CAD: Nonobstructive. No evidence of ischemia.  Continue ASA 81 and Crestor, needs lipids/LFTs in 2 months.   Barrier to outpatient management is limited reading ability.  Refer to paramedicine and guide it.  Follow up 4 weeks.   [name]  03/25/2015

## 2015-04-18 ENCOUNTER — Encounter (HOSPITAL_COMMUNITY): Payer: Self-pay | Admitting: Emergency Medicine

## 2015-04-18 ENCOUNTER — Emergency Department (HOSPITAL_COMMUNITY): Payer: 59

## 2015-04-18 ENCOUNTER — Emergency Department (HOSPITAL_COMMUNITY)
Admission: EM | Admit: 2015-04-18 | Discharge: 2015-04-19 | Disposition: A | Discharge status: Home / Self Care | Payer: 59 | Attending: Emergency Medicine | Admitting: Emergency Medicine

## 2015-04-18 DIAGNOSIS — Z720 Tobacco use: Secondary | ICD-10-CM | POA: Diagnosis not present

## 2015-04-18 DIAGNOSIS — I10 Essential (primary) hypertension: Secondary | ICD-10-CM | POA: Diagnosis not present

## 2015-04-18 DIAGNOSIS — M545 Low back pain, unspecified: Secondary | ICD-10-CM

## 2015-04-18 DIAGNOSIS — Z79899 Other long term (current) drug therapy: Secondary | ICD-10-CM | POA: Diagnosis not present

## 2015-04-18 DIAGNOSIS — Z9889 Other specified postprocedural states: Secondary | ICD-10-CM | POA: Diagnosis not present

## 2015-04-18 DIAGNOSIS — I2510 Atherosclerotic heart disease of native coronary artery without angina pectoris: Secondary | ICD-10-CM | POA: Insufficient documentation

## 2015-04-18 DIAGNOSIS — Z7982 Long term (current) use of aspirin: Secondary | ICD-10-CM | POA: Insufficient documentation

## 2015-04-18 LAB — CBC
HEMATOCRIT: 42.9 % (ref 39.0–52.0)
Hemoglobin: 14.8 g/dL (ref 13.0–17.0)
MCH: 31.9 pg (ref 26.0–34.0)
MCHC: 34.5 g/dL (ref 30.0–36.0)
MCV: 92.5 fL (ref 78.0–100.0)
Platelets: 196 10*3/uL (ref 150–400)
RBC: 4.64 MIL/uL (ref 4.22–5.81)
RDW: 12.8 % (ref 11.5–15.5)
WBC: 6.4 10*3/uL (ref 4.0–10.5)

## 2015-04-18 LAB — BASIC METABOLIC PANEL
ANION GAP: 9 (ref 5–15)
BUN: 22 mg/dL — ABNORMAL HIGH (ref 6–20)
CHLORIDE: 101 mmol/L (ref 101–111)
CO2: 24 mmol/L (ref 22–32)
Calcium: 9.4 mg/dL (ref 8.9–10.3)
Creatinine, Ser: 0.91 mg/dL (ref 0.61–1.24)
GFR calc Af Amer: >60 mL/min (ref >60)
GFR calc non Af Amer: >60 mL/min (ref >60)
GLUCOSE: 104 mg/dL — AB (ref 65–99)
POTASSIUM: 4.3 mmol/L (ref 3.5–5.1)
Sodium: 134 mmol/L — ABNORMAL LOW (ref 135–145)

## 2015-04-18 LAB — I-STAT TROPONIN, ED: Troponin i, poc: 0 ng/mL (ref 0.00–0.08)

## 2015-04-18 LAB — BRAIN NATRIURETIC PEPTIDE: B NATRIURETIC PEPTIDE 5: 85.2 pg/mL (ref 0.0–100.0)

## 2015-04-18 MED ORDER — KETOROLAC TROMETHAMINE 60 MG/2ML IM SOLN
60.0000 mg | Freq: Once | INTRAMUSCULAR | Status: AC
  Administered 2015-04-18: 60 mg via INTRAMUSCULAR
  Filled 2015-04-18: qty 2

## 2015-04-18 MED ORDER — DIAZEPAM 5 MG PO TABS
5.0000 mg | ORAL_TABLET | Freq: Once | ORAL | Status: AC
  Administered 2015-04-18: 5 mg via ORAL
  Filled 2015-04-18: qty 1

## 2015-04-18 NOTE — ED Provider Notes (Signed)
CSN: [id number]     Arrival date & time 04/18/15  1949 History   First MD Initiated Contact with Patient 04/18/15 2153     Chief Complaint  Patient presents with  . Back Pain     (Consider location/radiation/quality/duration/timing/severity/associated sxs/prior Treatment) HPI [name] [name] is a 59 y.o. male with a history of hypertension, coronary artery disease comes in for evaluation of low back pain. Patient states 3 days ago he was moving furniture and he began to have bilateral, low back pain. He has taken tramadol without relief of his symptoms. He rates his discomfort as a 9/10. Reports pain is exacerbated when he tries to get up from a sitting position, but once he is up and walking "it is not so bad". He denies any fevers, chills, numbness or weakness, loss of bowel or bladder function, saddle paresthesias, abdominal pain. No other aggravating or modifying factors. Denies any headache, vision changes, chest pain, shortness of breath.   Past Medical History  Diagnosis Date  . Hypertension   . Coronary artery disease    Past Surgical History  Procedure Laterality Date  . Chalazion excision Left 01/16/2014    Procedure: MINOR EXCISION OF CHALAZION;  Surgeon: Myrtha Mantis., MD;  Location: [place];  Service: Ophthalmology;  Laterality: Left;  . Cardiac catheterization N/A 02/04/2015    Procedure: Right/Left Heart Cath and Coronary Angiography;  Surgeon: Larey Dresser, MD;  Location: [place] [facility];  Service: Cardiovascular;  Laterality: N/A;   History reviewed. No pertinent family history. History  Substance Use Topics  . Smoking status: Current Every Day Smoker  . Smokeless tobacco: Not on file  . Alcohol Use: Yes    Review of Systems  A 10 point review of systems was completed and was negative except for pertinent positives and negatives as mentioned in the history of present illness    Allergies  Review of patient's allergies indicates  no known allergies.  Home Medications   Prior to Admission medications   Medication Sig Start Date End Date Taking? Authorizing Provider  aspirin EC 81 MG EC tablet Take 1 tablet (81 mg total) by mouth daily. 02/07/15  Yes Amy D Clegg, NP  carvedilol (COREG) 3.125 MG tablet Take 2 tablets (6.25 mg total) by mouth 2 (two) times daily with a meal. 02/28/15  Yes Amy D Clegg, NP  digoxin (LANOXIN) 0.125 MG tablet Take 1 tablet (0.125 mg total) by mouth daily. 02/07/15  Yes Amy D Clegg, NP  furosemide (LASIX) 20 MG tablet Take 1 tablet (20 mg total) by mouth daily. 02/28/15  Yes Amy D Clegg, NP  isosorbide-hydrALAZINE (BIDIL) 20-37.5 MG per tablet Take 1 tablet by mouth 3 (three) times daily. 02/07/15  Yes Amy D Clegg, NP  lisinopril (PRINIVIL,ZESTRIL) 10 MG tablet Take 1 tablet (10 mg total) by mouth daily. 03/25/15  Yes Larey Dresser, MD  rosuvastatin (CRESTOR) 20 MG tablet Take 1 tablet (20 mg total) by mouth daily. 01/27/15  Yes Tresa Garter, MD  spironolactone (ALDACTONE) 25 MG tablet Take 1 tablet (25 mg total) by mouth daily. 02/07/15  Yes Amy D Clegg, NP  traMADol (ULTRAM) 50 MG tablet Take 1 tablet (50 mg total) by mouth every 8 (eight) hours as needed. Patient taking differently: Take 50 mg by mouth every 8 (eight) hours as needed for moderate pain.  01/27/15  Yes Olugbemiga E Jegede, MD   BP 120/69 mmHg  Pulse 57  Temp(Src) 98.7 F (37.1 C) (  Oral)  Resp 16  SpO2 100% Physical Exam  Constitutional: He is oriented to person, place, and time. He appears well-developed and well-nourished.  HENT:  Head: Normocephalic and atraumatic.  Mouth/Throat: Oropharynx is clear and moist.  Eyes: Conjunctivae are normal. Pupils are equal, round, and reactive to light. Right eye exhibits no discharge. Left eye exhibits no discharge. No scleral icterus.  Neck: Normal range of motion. Neck supple.  Cardiovascular: Normal rate, regular rhythm and normal heart sounds.   Pulmonary/Chest: Effort normal and  breath sounds normal. No respiratory distress. He has no wheezes. He has no rales.  Abdominal: Soft. There is no tenderness.  Musculoskeletal: He exhibits no tenderness.  Tenderness to palpation diffusely throughout the paraspinal lumbar region, specifically over bilateral SI joints. No overt midline bony tenderness. No step-offs or crepitus, other lesions or deformities noted.  Neurological: He is alert and oriented to person, place, and time.  Cranial Nerves II-XII grossly intact  Skin: Skin is warm and dry. No rash noted.  Psychiatric: He has a normal mood and affect.  Nursing note and vitals reviewed.   ED Course  Procedures (including critical care time) Labs Review Labs Reviewed  BASIC METABOLIC PANEL - Abnormal; Notable for the following:    Sodium 134 (*)    Glucose, Bld 104 (*)    BUN 22 (*)    All other components within normal limits  CBC  BRAIN NATRIURETIC PEPTIDE  I-STAT TROPOININ, ED    Imaging Review Dg Chest 2 View  04/18/2015   CLINICAL DATA:  Bradycardia. Shortness of breath. Acute onset of back pain yesterday after lifting something heavy.  EXAM: CHEST  2 VIEW  COMPARISON:  10/26/2011 and earlier, including coronary CTA 06/16/2008.  FINDINGS: Cardiac silhouette upper normal in size to slightly enlarged, unchanged. Thoracic aorta mildly tortuous and atherosclerotic, unchanged. Hilar and mediastinal contours otherwise unremarkable. Lungs clear. Bronchovascular markings normal. Pulmonary vascularity normal. No visible pleural effusions. No pneumothorax. Visualized bony thorax intact.  IMPRESSION: Stable borderline heart size.  No acute cardiopulmonary disease.   Electronically Signed   By: Evangeline Dakin M.D.   On: 04/18/2015 20:44     EKG Interpretation None     Meds given in ED:  Medications  ketorolac (TORADOL) injection 60 mg (60 mg Intramuscular Given 04/18/15 2302)  diazepam (VALIUM) tablet 5 mg (5 mg Oral Given 04/18/15 2302)    Discharge Medication  List as of 04/18/2015 11:51 PM     Filed Vitals:   04/18/15 2215 04/18/15 2230 04/18/15 2245 04/19/15 0001  BP: 118/58 120/69 118/63 120/69  Pulse: 42 47 43 57  Temp:      TempSrc:      Resp: 16 19 16 16   SpO2: 100% 100% 100% 100%    MDM  Vitals stable  -afebrile. Mild bradycardia, but patient has appropriate blood pressure and appears well. Pt resting comfortably in ED. reports resolution of symptoms after IM Toradol and oral Valium. PE--normal neuro exam. Physical exam is not concerning. Patient likely suffering from lumbar sacral strain versus other musculoskeletal injury secondary to physical activity. No evidence of acute spinal cord pathology, doubt cauda equina or conus medullaris syndrome. Patient states he feels well and is ready to go home. I discussed all relevant lab findings and imaging results with pt and they verbalized understanding. Discussed f/u with PCP within 48 hrs and return precautions, pt very amenable to plan.  Final diagnoses:  Bilateral low back pain without sciatica  Comer Locket, PA-C 04/19/15 0865  Sherwood Gambler, MD 04/19/15 [phone]

## 2015-04-18 NOTE — Discharge Instructions (Signed)
Back Pain, Adult Low back pain is very common. About 1 in 5 people have back pain.The cause of low back pain is rarely dangerous. The pain often gets better over time.About half of people with a sudden onset of back pain feel better in just 2 weeks. About 8 in 10 people feel better by 6 weeks.  CAUSES Some common causes of back pain include:  Strain of the muscles or ligaments supporting the spine.  Wear and tear (degeneration) of the spinal discs.  Arthritis.  Direct injury to the back. DIAGNOSIS Most of the time, the direct cause of low back pain is not known.However, back pain can be treated effectively even when the exact cause of the pain is unknown.Answering your caregiver's questions about your overall health and symptoms is one of the most accurate ways to make sure the cause of your pain is not dangerous. If your caregiver needs more information, he or she may order lab work or imaging tests (X-rays or MRIs).However, even if imaging tests show changes in your back, this usually does not require surgery. HOME CARE INSTRUCTIONS For many people, back pain returns.Since low back pain is rarely dangerous, it is often a condition that people can learn to manageon their own.   Remain active. It is stressful on the back to sit or stand in one place. Do not sit, drive, or stand in one place for more than 30 minutes at a time. Take short walks on level surfaces as soon as pain allows.Try to increase the length of time you walk each day.  Do not stay in bed.Resting more than 1 or 2 days can delay your recovery.  Do not avoid exercise or work.Your body is made to move.It is not dangerous to be active, even though your back may hurt.Your back will likely heal faster if you return to being active before your pain is gone.  Pay attention to your body when you bend and lift. Many people have less discomfortwhen lifting if they bend their knees, keep the load close to their bodies,and  avoid twisting. Often, the most comfortable positions are those that put less stress on your recovering back.  Find a comfortable position to sleep. Use a firm mattress and lie on your side with your knees slightly bent. If you lie on your back, put a pillow under your knees.  Only take over-the-counter or prescription medicines as directed by your caregiver. Over-the-counter medicines to reduce pain and inflammation are often the most helpful.Your caregiver may prescribe muscle relaxant drugs.These medicines help dull your pain so you can more quickly return to your normal activities and healthy exercise.  Put ice on the injured area.  Put ice in a plastic bag.  Place a towel between your skin and the bag.  Leave the ice on for 15-20 minutes, 03-04 times a day for the first 2 to 3 days. After that, ice and heat may be alternated to reduce pain and spasms.  Ask your caregiver about trying back exercises and gentle massage. This may be of some benefit.  Avoid feeling anxious or stressed.Stress increases muscle tension and can worsen back pain.It is important to recognize when you are anxious or stressed and learn ways to manage it.Exercise is a great option. SEEK MEDICAL CARE IF:  You have pain that is not relieved with rest or medicine.  You have pain that does not improve in 1 week.  You have new symptoms.  You are generally not feeling well. SEEK   IMMEDIATE MEDICAL CARE IF:   You have pain that radiates from your back into your legs.  You develop new bowel or bladder control problems.  You have unusual weakness or numbness in your arms or legs.  You develop nausea or vomiting.  You develop abdominal pain.  You feel faint. Document Released: 09/13/2005 Document Revised: 03/14/2012 Document Reviewed: 01/15/2014 Exit[employer] Patient Information 2015 [employer]. This information is not intended to replace advice given to you by your health care provider. Make sure you  discuss any questions you have with your health care provider.  

## 2015-04-18 NOTE — ED Notes (Signed)
Patient states that he has had back pain since yesterday.  Patient states that he picked up something heavy yesterday.  Patient's heart rate is bradycardic in triage and does not match pulse ox heart rate.  Patient denies any chest pain.  Patient does have some shortness of breath.

## 2015-04-18 NOTE — ED Notes (Signed)
Pt sts he was moving last week when he began to have back pain.  Sts his pain usually goes away after a few days, but this time it has stuck around.  Pt sts he has taken Tramadol without any relief.  Says he has not tried anything else because his doctor told him not to take anything without his approval.

## 2015-04-19 NOTE — ED Notes (Signed)
Pt ambulated to lobby with RN.  Gait steady and even with the assistance of his cane from home.

## 2015-04-21 ENCOUNTER — Encounter: Payer: Self-pay | Admitting: Internal Medicine

## 2015-04-24 ENCOUNTER — Encounter (HOSPITAL_COMMUNITY): Payer: Self-pay

## 2015-04-24 ENCOUNTER — Encounter: Payer: Self-pay | Admitting: *Deleted

## 2015-04-24 ENCOUNTER — Ambulatory Visit (HOSPITAL_COMMUNITY)
Admission: RE | Admit: 2015-04-24 | Discharge: 2015-04-24 | Disposition: A | Discharge status: Home / Self Care | Payer: 59 | Source: Ambulatory Visit | Attending: Internal Medicine | Admitting: Internal Medicine

## 2015-04-24 ENCOUNTER — Telehealth: Payer: Self-pay | Admitting: Internal Medicine

## 2015-04-24 VITALS — BP 120/64 | HR 63 | Wt 158.2 lb

## 2015-04-24 DIAGNOSIS — F1010 Alcohol abuse, uncomplicated: Secondary | ICD-10-CM

## 2015-04-24 DIAGNOSIS — I5022 Chronic systolic (congestive) heart failure: Secondary | ICD-10-CM | POA: Diagnosis not present

## 2015-04-24 DIAGNOSIS — Z006 Encounter for examination for normal comparison and control in clinical research program: Secondary | ICD-10-CM

## 2015-04-24 MED ORDER — FUROSEMIDE 20 MG PO TABS
20.0000 mg | ORAL_TABLET | Freq: Every day | ORAL | Status: DC | PRN
Start: 2015-04-24 — End: 2016-02-18

## 2015-04-24 MED ORDER — SACUBITRIL-VALSARTAN 24-26 MG PO TABS: 1.0000 | ORAL_TABLET | Freq: Two times a day (BID) | ORAL | Status: DC

## 2015-04-24 NOTE — Progress Notes (Signed)
[name] with paramedicine notified of medication changes, new prescription cards, and upcoming appointments made during patient's appointment with the CHF clinic today.  (Patient cannot read).  [name] [name]

## 2015-04-24 NOTE — Telephone Encounter (Signed)
Patient came into office requesting medication refill on traMADol (ULTRAM) 50 MG tablet, patient is completely out of medication and wants to know if we can authorize a refill. Please f/u

## 2015-04-24 NOTE — Progress Notes (Signed)
Patient ID: [name] [name], male   DOB: [birth year], 59 y.o.   MRN: [id number] PCP: Dr Doreene Burke Primary Cardiologist: Dr Aundra Dubin.   HPI: [name] [name] is a 59 year old with history of ETOH was found to have EF 10% by echo. He had exertional dyspnea and chest pain, taken for LHC/RHC 5/10 showing nonobstructive CAD, elevated filling pressures (but not marked elevation), and low cardiac index. Unfortunately he is not a candidate for advanced therapies/home inotropes due to ETOH abuse. He was admitted to optimize HF/HFmeds. He was diuresed with IV lasix and transitioned to po lasix. Overall he diuresed 9 pounds. He continued on low dose carvedilol, lisinopril, bidil, and spironolactone.   [name] [name] returns for followup.  Overall, he feels like he is doing ok. Having back pain. Weight at home 153-154 pounds.  SOB with inclined surfaces. Remains SOB after 1 block.  No orthopnea/PND.  Not drinking alcohol.   RHC/LHC:  02/04/2015  RA mean 10 RV 51/21 PA 49/28, mean 35 PCWP mean 25 LV 111/20 AO 111/62 Oxygen saturations: PA 51% AO 94% Cardiac Output (Fick) 2.84  Cardiac Index (Fick) 1.41 PVR 3.5 WU Nonobstructive CAD  Labs (5/16): K 3.8, creatinine 0.94 => 1.19, HCT 48.4, LDL 121, HDL 50, digoxin 0.3, BNP 2343 => 367 Labs (6/16): K 4, creatinine 1.06 Labs 04/18/15; K 4.3 Creatinine 0.91  PMH: 1. Low back pain 2. HTN 3. Hyperlipidemia 4. H/o cocaine abuse: Not active. 5. ETOH abuse: 1 pint/day 6. CAD: Coronary CT angiogram in 9/09 showed possible significant obstructive disease in the circumflex. He did not have cardiac cath.  7. Cardiomyopathy: Echo (9/09) with EF 35-40%. Echo (5/16) with EF 10%, mild LV dilation, grade II diastolic dysfunction, mildly dilated RV with moderately decreased systolic function. LHC/RHC (5/16): Nonobstructive coronary disease, mean RA 10, PA 49/28, mean PCWP 25, CI 1.4.  8. Active smoker 9. PAD: ABI L normal, R mildly decreased.   SH: Lives in [place],  works as Dealer, lives with son, smokes 1 ppd, drank 1 pint/day liquor => quit 5/16, prior cocaine use.   FH: Brother with MI in his 58s, another brother with ?SCD, mother with CVA.   ROS: All systems reviewed and negative except as per HPI.   Current Outpatient Prescriptions  Medication Sig Dispense Refill  . aspirin EC 81 MG EC tablet Take 1 tablet (81 mg total) by mouth daily. 30 tablet 6  . carvedilol (COREG) 3.125 MG tablet Take 2 tablets (6.25 mg total) by mouth 2 (two) times daily with a meal. 120 tablet 6  . digoxin (LANOXIN) 0.125 MG tablet Take 1 tablet (0.125 mg total) by mouth daily. 30 tablet 6  . furosemide (LASIX) 20 MG tablet Take 1 tablet (20 mg total) by mouth daily. 30 tablet 3  . isosorbide-hydrALAZINE (BIDIL) 20-37.5 MG per tablet Take 1 tablet by mouth 3 (three) times daily. 90 tablet 6  . lisinopril (PRINIVIL,ZESTRIL) 10 MG tablet Take 1 tablet (10 mg total) by mouth daily. 30 tablet 3  . rosuvastatin (CRESTOR) 20 MG tablet Take 1 tablet (20 mg total) by mouth daily. 90 tablet 3  . spironolactone (ALDACTONE) 25 MG tablet Take 1 tablet (25 mg total) by mouth daily. 30 tablet 3  . traMADol (ULTRAM) 50 MG tablet Take 1 tablet (50 mg total) by mouth every 8 (eight) hours as needed. (Patient taking differently: Take 50 mg by mouth every 8 (eight) hours as needed for moderate pain. ) 60 tablet 0   No current facility-administered medications  for this encounter.    Filed Vitals:   04/24/15 1053  BP: 120/64  Pulse: 63  Weight: 158 lb 4 oz (71.782 kg)  SpO2: 99%    PHYSICAL EXAM:  General:  Thin appearing. No resp difficulty HEENT: normal Neck: supple. JVP flat. Carotids 2+ bilaterally; no bruits. No lymphadenopathy or thryomegaly appreciated.  Cor: PMI laterally displaced. Regular rate & rhythm. No rubs, gallops or murmurs.  Unable to palpate pedal pulses.  Lungs: clear Abdomen: soft, nontender, nondistended. No hepatosplenomegaly. No bruits or masses. Good bowel  sounds. Extremities: no cyanosis, clubbing, rash, edema Neuro: alert & orientedx3, cranial nerves grossly intact. Moves all 4 extremities w/o difficulty. Affect pleasant.  ASSESSMENT & PLAN: 1. Chronic Systolic HF: Nonischemic cardiomyopathy with low output per RHC/LHC in 5/16.  Possible ETOH cardiomyopathy.  NYHA class III symptoms.  Overall, he is doing better.  He is taking all his meds.  Euvolemic on exam.  - Continue Coreg 6.25 mg bid, Bidil 1 tab tid, spironolactone 25 daily. - Stop lisinopril. Allow 36 hour wash out. Start entresto 24-26 mg twice a day.   BMET in 10 days.  - Continue digoxin 0.125 daily, recent digoxin level was ok.  - Stop lasix.   - Repeat echo in 9/16.  If he stays off ETOH and EF remains low, he will be candidate for ICD (narrow QRS, no CRT).   - At this point, not candidate for advanced therapies or home milrinone due to ETOH abuse. LV function may improve significantly over time off ETOH. 2. ETOH Abuse: Former heavy drinker, he has quit.   3. Current Smoker: Encouraged to stop smoking.  4. PAD: Mild claudication.  ABIs not markedly abnormal.  Encouraged him to quit smoking.  Continue ASA 81 and Crestor 20 daily with vascular disease.   5. Hyperlipidemia: Check lipids in 8/16, goal LDL < 70 with PAD.   5. CAD: Nonobstructive. No evidence of ischemia.  Continue ASA 81 and Crestor, needs lipids/LFTs in 2 months.    Follow up 4 weeks.   CLEGG,AMY NP-C 04/24/2015  Patient seen and examined with Darrick Grinder, NP. We discussed all aspects of the encounter. I agree with the assessment and plan as stated above.   Despite severity of HF on recent RHC, he appears to be doing OK. Says he feels better. No volume overload. Counseled on need to stop drinking. Agree with switch to [facility]. Discussed ICD as above.  Jaymes Revels,MD 10:25 PM

## 2015-04-24 NOTE — Patient Instructions (Signed)
STOP Lisinopril.  START Entresto 24-46mg  tablet twice daily on Saturday.  CHANGE Furosemide (Lasix) to 20mg  tablet AS NEEDED for weight gain, fluid, swelling, edema.  Return in 1-2 weeks for lab work.  Follow up 1 month.  Do the following things EVERYDAY: 1) Weigh yourself in the morning before breakfast. Write it down and keep it in a log. 2) Take your medicines as prescribed 3) Eat low salt foods-Limit salt (sodium) to 2000 mg per day.  4) Stay as active as you can everyday 5) Limit all fluids for the day to less than 2 liters

## 2015-04-25 NOTE — Telephone Encounter (Signed)
Patient came into office requesting status of medication refill for traMADol (ULTRAM) 50 MG tablet. Patient states he is in a lot of pain. Please f/u with patient

## 2015-04-28 ENCOUNTER — Encounter: Payer: Self-pay | Admitting: Internal Medicine

## 2015-04-29 ENCOUNTER — Other Ambulatory Visit: Payer: Self-pay | Admitting: Internal Medicine

## 2015-05-01 ENCOUNTER — Other Ambulatory Visit (HOSPITAL_COMMUNITY): Payer: Self-pay | Admitting: *Deleted

## 2015-05-05 ENCOUNTER — Other Ambulatory Visit (HOSPITAL_COMMUNITY): Payer: Self-pay | Admitting: *Deleted

## 2015-05-05 MED ORDER — SACUBITRIL-VALSARTAN 24-26 MG PO TABS: 1.0000 | ORAL_TABLET | Freq: Two times a day (BID) | ORAL | Status: DC

## 2015-05-06 ENCOUNTER — Telehealth (HOSPITAL_COMMUNITY): Payer: Self-pay | Admitting: *Deleted

## 2015-05-06 NOTE — Telephone Encounter (Signed)
Completed PA for pt's [name] [name], med approved through 05/01/16, ref # [id number]

## 2015-05-07 ENCOUNTER — Ambulatory Visit (HOSPITAL_COMMUNITY)
Admission: RE | Admit: 2015-05-07 | Discharge: 2015-05-07 | Disposition: A | Discharge status: Home / Self Care | Payer: 59 | Source: Ambulatory Visit | Attending: Internal Medicine | Admitting: Internal Medicine

## 2015-05-07 DIAGNOSIS — I5023 Acute on chronic systolic (congestive) heart failure: Secondary | ICD-10-CM

## 2015-05-07 DIAGNOSIS — I5022 Chronic systolic (congestive) heart failure: Secondary | ICD-10-CM | POA: Diagnosis not present

## 2015-05-07 LAB — BASIC METABOLIC PANEL WITH GFR
Anion gap: 7 (ref 5–15)
BUN: 11 mg/dL (ref 6–20)
CO2: 27 mmol/L (ref 22–32)
Calcium: 9.8 mg/dL (ref 8.9–10.3)
Chloride: 107 mmol/L (ref 101–111)
Creatinine, Ser: 0.88 mg/dL (ref 0.61–1.24)
GFR calc Af Amer: >60 mL/min
GFR calc non Af Amer: >60 mL/min
Glucose, Bld: 106 mg/dL — ABNORMAL HIGH (ref 65–99)
Potassium: 4.5 mmol/L (ref 3.5–5.1)
Sodium: 141 mmol/L (ref 135–145)

## 2015-05-22 NOTE — Progress Notes (Signed)
I saw patient on 04/24/15 for final Guide It Study visit. Discussed the Guide it study with patient and patient verbalized understanding. Patient will continue to follow-up in CHF clinic    as scheduled.

## 2015-05-27 ENCOUNTER — Ambulatory Visit (HOSPITAL_COMMUNITY)
Admission: RE | Admit: 2015-05-27 | Discharge: 2015-05-27 | Disposition: A | Discharge status: Home / Self Care | Payer: 59 | Source: Ambulatory Visit | Attending: Internal Medicine | Admitting: Internal Medicine

## 2015-05-27 ENCOUNTER — Encounter (HOSPITAL_COMMUNITY): Payer: Self-pay

## 2015-05-27 VITALS — BP 108/60 | HR 67 | Wt 157.5 lb

## 2015-05-27 DIAGNOSIS — Z823 Family history of stroke: Secondary | ICD-10-CM | POA: Diagnosis not present

## 2015-05-27 DIAGNOSIS — F17210 Nicotine dependence, cigarettes, uncomplicated: Secondary | ICD-10-CM | POA: Insufficient documentation

## 2015-05-27 DIAGNOSIS — I10 Essential (primary) hypertension: Secondary | ICD-10-CM | POA: Insufficient documentation

## 2015-05-27 DIAGNOSIS — Z79899 Other long term (current) drug therapy: Secondary | ICD-10-CM | POA: Diagnosis not present

## 2015-05-27 DIAGNOSIS — Z7982 Long term (current) use of aspirin: Secondary | ICD-10-CM | POA: Insufficient documentation

## 2015-05-27 DIAGNOSIS — I739 Peripheral vascular disease, unspecified: Secondary | ICD-10-CM | POA: Diagnosis not present

## 2015-05-27 DIAGNOSIS — I2510 Atherosclerotic heart disease of native coronary artery without angina pectoris: Secondary | ICD-10-CM | POA: Diagnosis not present

## 2015-05-27 DIAGNOSIS — I5022 Chronic systolic (congestive) heart failure: Secondary | ICD-10-CM | POA: Insufficient documentation

## 2015-05-27 DIAGNOSIS — Z8249 Family history of ischemic heart disease and other diseases of the circulatory system: Secondary | ICD-10-CM | POA: Insufficient documentation

## 2015-05-27 DIAGNOSIS — F1010 Alcohol abuse, uncomplicated: Secondary | ICD-10-CM | POA: Insufficient documentation

## 2015-05-27 DIAGNOSIS — E785 Hyperlipidemia, unspecified: Secondary | ICD-10-CM | POA: Insufficient documentation

## 2015-05-27 DIAGNOSIS — I428 Other cardiomyopathies: Secondary | ICD-10-CM | POA: Insufficient documentation

## 2015-05-27 NOTE — Progress Notes (Signed)
Patient ID: [name] [name], male   DOB: September 03, 1956, 59 y.o.   MRN: [id number] PCP: Dr Doreene Burke Primary Cardiologist: Dr Aundra Dubin PARAMEDICINE Patient.   HPI: [name] [name] [name] is a 59 year old with history of ETOH was found to have EF 10% by echo. He had exertional dyspnea and chest pain, taken for LHC/RHC 5/10 showing nonobstructive CAD, elevated filling pressures (but not marked elevation), and low cardiac index. Unfortunately he is not a candidate for advanced therapies/home inotropes due to ETOH abuse. He was admitted to optimize HF/HFmeds. He was diuresed with IV lasix and transitioned to po lasix. Overall he diuresed 9 pounds. He continued on low dose carvedilol, lisinopril, bidil, and spironolactone.   [name] [name] returns for follow up. Last visit ace stopped and started on entresto.  Having back pain. Weight at home 153-154 pounds.  SOB with inclined surfaces. Remains SOB after 1 block.  No orthopnea/PND.  Appetite ok.  Not drinking alcohol. Taking all medications. Continues 1 PPD per day. Followed by paramedicine.   RHC/LHC:  02/04/2015  RA mean 10 RV 51/21 PA 49/28, mean 35 PCWP mean 25 LV 111/20 AO 111/62 Oxygen saturations: PA 51% AO 94% Cardiac Output (Fick) 2.84  Cardiac Index (Fick) 1.41 PVR 3.5 WU Nonobstructive CAD  Labs (5/16): K 3.8, creatinine 0.94 => 1.19, HCT 48.4, LDL 121, HDL 50, digoxin 0.3, BNP 2343 => 367 Labs (6/16): K 4, creatinine 1.06 Labs 04/18/15; K 4.3 Creatinine 0.91 Labs 8/10/29016: K 4.5 Creatinine 0.88   PMH: 1. Low back pain 2. HTN 3. Hyperlipidemia 4. H/o cocaine abuse: Not active. 5. ETOH abuse: 1 pint/day 6. CAD: Coronary CT angiogram in 9/09 showed possible significant obstructive disease in the circumflex. He did not have cardiac cath.  7. Cardiomyopathy: Echo (9/09) with EF 35-40%. Echo (5/16) with EF 10%, mild LV dilation, grade II diastolic dysfunction, mildly dilated RV with moderately decreased systolic function. LHC/RHC (5/16):  Nonobstructive coronary disease, mean RA 10, PA 49/28, mean PCWP 25, CI 1.4.  8. Active smoker 9. PAD: ABI L normal, R mildly decreased.   SH: Lives in [place], works as Dealer, lives with son, smokes 1 ppd, drank 1 pint/day liquor => quit 5/16, prior cocaine use.   FH: Brother with MI in his 48s, another brother with ?SCD, mother with CVA.   ROS: All systems reviewed and negative except as per HPI.   Current Outpatient Prescriptions  Medication Sig Dispense Refill  . aspirin EC 81 MG EC tablet Take 1 tablet (81 mg total) by mouth daily. 30 tablet 6  . carvedilol (COREG) 3.125 MG tablet Take 2 tablets (6.25 mg total) by mouth 2 (two) times daily with a meal. 120 tablet 6  . digoxin (LANOXIN) 0.125 MG tablet Take 1 tablet (0.125 mg total) by mouth daily. 30 tablet 6  . furosemide (LASIX) 20 MG tablet Take 1 tablet (20 mg total) by mouth daily as needed for fluid or edema. 30 tablet 3  . isosorbide-hydrALAZINE (BIDIL) 20-37.5 MG per tablet Take 1 tablet by mouth 3 (three) times daily. 90 tablet 6  . rosuvastatin (CRESTOR) 20 MG tablet Take 1 tablet (20 mg total) by mouth daily. 90 tablet 3  . sacubitril-valsartan (ENTRESTO) 24-26 MG Take 1 tablet by mouth 2 (two) times daily. 60 tablet 3  . spironolactone (ALDACTONE) 25 MG tablet Take 1 tablet (25 mg total) by mouth daily. 30 tablet 3  . traMADol (ULTRAM) 50 MG tablet Take 1 tablet (50 mg total) by mouth every 8 (eight) hours  as needed. (Patient taking differently: Take 50 mg by mouth every 8 (eight) hours as needed for moderate pain. ) 60 tablet 0   No current facility-administered medications for this encounter.    Filed Vitals:   05/27/15 1010  BP: 108/60  Pulse: 67  Weight: 157 lb 8 oz (71.442 kg)  SpO2: 99%    PHYSICAL EXAM: General:  Thin appearing. No resp difficulty HEENT: normal Neck: supple. JVP flat. Carotids 2+ bilaterally; no bruits. No lymphadenopathy or thryomegaly appreciated.  Cor: PMI laterally displaced.  Regular rate & rhythm. No rubs, gallops or murmurs.  Unable to palpate pedal pulses.  Lungs: clear Abdomen: soft, nontender, nondistended. No hepatosplenomegaly. No bruits or masses. Good bowel sounds. Extremities: no cyanosis, clubbing, rash, edema Neuro: alert & orientedx3, cranial nerves grossly intact. Moves all 4 extremities w/o difficulty. Affect pleasant.  ASSESSMENT & PLAN: 1. Chronic Systolic HF: Nonischemic cardiomyopathy with low output per RHC/LHC in 5/16.  Possible ETOH cardiomyopathy.  NYHA class III symptoms.    He is taking all his meds. Limited reading ability. We will need to keep medication regimen as simple as possible.   Euvolemic on exam. Off lasix.  - Continue Coreg 6.25 mg bid will not increase with fatigue.  -Continue  Bidil 1 tab tid, spironolactone 25 daily. - Continue entresto 24-26 mg twice a day.  - Continue digoxin 0.125 daily, recent digoxin level was ok.  - Repeat echo in 9/16.  If he stays off ETOH and EF remains low, he will be candidate for ICD (narrow QRS, no CRT).  He understands if EF low we will need to consider ICD.  May need CPX to determine limitations HF vsrsus pulmonary.  - At this point, not candidate for advanced therapies or home milrinone due to ETOH abuse. LV function may improve significantly over time off ETOH. 2. ETOH Abuse: Former heavy drinker, he has quit.   3. Current Smoker: Continues smokes 1PPD. Encouraged to stop smoking.  4. PAD: Mild claudication.  ABIs not markedly abnormal.  Encouraged him to quit smoking.  Continue ASA 81 and Crestor 20 daily with vascular disease.   5. Hyperlipidemia: Check lipids in 8/16, goal LDL < 70 with PAD.   5. CAD: Nonobstructive. No evidence of ischemia.  Continue ASA 81 and Crestor, needs lipids/LFTs. Next visit.     Follow up 4 weeks with an ECHO and Dr Aundra Dubin.   Latoy Labriola NP-C 05/27/2015

## 2015-05-27 NOTE — Patient Instructions (Signed)
Follow up 3-4 weeks with echocardiogram.  Do the following things EVERYDAY: 1) Weigh yourself in the morning before breakfast. Write it down and keep it in a log. 2) Take your medicines as prescribed 3) Eat low salt foods-Limit salt (sodium) to 2000 mg per day.  4) Stay as active as you can everyday 5) Limit all fluids for the day to less than 2 liters

## 2015-06-27 ENCOUNTER — Other Ambulatory Visit (HOSPITAL_COMMUNITY): Payer: Self-pay | Admitting: Adult Health

## 2015-06-27 ENCOUNTER — Encounter: Payer: Self-pay | Admitting: Internal Medicine

## 2015-06-27 ENCOUNTER — Ambulatory Visit (HOSPITAL_COMMUNITY)
Admission: RE | Admit: 2015-06-27 | Discharge: 2015-06-27 | Disposition: A | Discharge status: Home / Self Care | Payer: 59 | Source: Ambulatory Visit | Attending: Internal Medicine | Admitting: Internal Medicine

## 2015-06-27 ENCOUNTER — Ambulatory Visit (HOSPITAL_BASED_OUTPATIENT_CLINIC_OR_DEPARTMENT_OTHER)
Admission: RE | Admit: 2015-06-27 | Discharge: 2015-06-27 | Disposition: A | Discharge status: Home / Self Care | Payer: 59 | Source: Ambulatory Visit | Attending: Cardiology | Admitting: Cardiology

## 2015-06-27 ENCOUNTER — Encounter (HOSPITAL_COMMUNITY): Payer: Self-pay

## 2015-06-27 VITALS — BP 112/60 | HR 63 | Wt 161.2 lb

## 2015-06-27 DIAGNOSIS — Z7982 Long term (current) use of aspirin: Secondary | ICD-10-CM | POA: Insufficient documentation

## 2015-06-27 DIAGNOSIS — Z823 Family history of stroke: Secondary | ICD-10-CM | POA: Diagnosis not present

## 2015-06-27 DIAGNOSIS — F17210 Nicotine dependence, cigarettes, uncomplicated: Secondary | ICD-10-CM | POA: Insufficient documentation

## 2015-06-27 DIAGNOSIS — I5022 Chronic systolic (congestive) heart failure: Secondary | ICD-10-CM

## 2015-06-27 DIAGNOSIS — Z79899 Other long term (current) drug therapy: Secondary | ICD-10-CM | POA: Insufficient documentation

## 2015-06-27 DIAGNOSIS — I739 Peripheral vascular disease, unspecified: Secondary | ICD-10-CM | POA: Diagnosis not present

## 2015-06-27 DIAGNOSIS — E785 Hyperlipidemia, unspecified: Secondary | ICD-10-CM | POA: Insufficient documentation

## 2015-06-27 DIAGNOSIS — Z8249 Family history of ischemic heart disease and other diseases of the circulatory system: Secondary | ICD-10-CM | POA: Insufficient documentation

## 2015-06-27 DIAGNOSIS — I428 Other cardiomyopathies: Secondary | ICD-10-CM | POA: Insufficient documentation

## 2015-06-27 DIAGNOSIS — F17200 Nicotine dependence, unspecified, uncomplicated: Secondary | ICD-10-CM

## 2015-06-27 DIAGNOSIS — I10 Essential (primary) hypertension: Secondary | ICD-10-CM | POA: Insufficient documentation

## 2015-06-27 DIAGNOSIS — I2510 Atherosclerotic heart disease of native coronary artery without angina pectoris: Secondary | ICD-10-CM | POA: Insufficient documentation

## 2015-06-27 DIAGNOSIS — Z720 Tobacco use: Secondary | ICD-10-CM | POA: Diagnosis not present

## 2015-06-27 DIAGNOSIS — F1010 Alcohol abuse, uncomplicated: Secondary | ICD-10-CM | POA: Insufficient documentation

## 2015-06-27 LAB — BASIC METABOLIC PANEL
ANION GAP: 7 (ref 5–15)
BUN: 8 mg/dL (ref 6–20)
CALCIUM: 10.1 mg/dL (ref 8.9–10.3)
CO2: 28 mmol/L (ref 22–32)
Chloride: 105 mmol/L (ref 101–111)
Creatinine, Ser: 0.89 mg/dL (ref 0.61–1.24)
GFR calc Af Amer: >60 mL/min (ref >60)
GFR calc non Af Amer: >60 mL/min (ref >60)
GLUCOSE: 108 mg/dL — AB (ref 65–99)
Potassium: 3.8 mmol/L (ref 3.5–5.1)
Sodium: 140 mmol/L (ref 135–145)

## 2015-06-27 LAB — CHOLESTEROL, TOTAL: Cholesterol: 163 mg/dL (ref 0–200)

## 2015-06-27 LAB — BRAIN NATRIURETIC PEPTIDE: B Natriuretic Peptide: 92.7 pg/mL (ref 0.0–100.0)

## 2015-06-27 MED ORDER — SPIRONOLACTONE 25 MG PO TABS: 12.5000 mg | ORAL_TABLET | Freq: Every day | ORAL | Status: DC

## 2015-06-27 NOTE — Progress Notes (Signed)
Advanced Heart Failure Medication Review by a Pharmacist  Does the patient  feel that his/her medications are working for him/her?  yes  Has the patient been experiencing any side effects to the medications prescribed?  no  Does the patient measure his/her own blood pressure or blood glucose at home?  no   Does the patient have any problems obtaining medications due to transportation or finances?   no  Understanding of regimen: good Understanding of indications: good Potential of compliance: good    Pharmacist comments:  [name] [name] is a pleasant 59 yo M presenting with his medication bottles and an up-to-date medication list. He reports excellent compliance with all of his medications but states he ran out of his spironolactone for ~10 days and just picked up a new Rx yesterday. His bottle directions are 1/2 tablet daily but he states that he was previously taking a whole tablet daily. We will verify his dose with Dr. Aundra Dubin. He did not have any other medication-related questions or concerns for me at this time.   Ruta Hinds. Velva Harman, PharmD, BCPS, CPP Clinical Pharmacist Pager: [phone] Phone: [phone] 06/27/2015 10:21 AM

## 2015-06-27 NOTE — Progress Notes (Signed)
Patient ID: [name] [name], male   DOB: 09/21/1956, 59 y.o.   MRN: [id number] PCP: Dr Doreene Burke Primary Cardiologist: Dr Aundra Dubin PARAMEDICINE Patient.   HPI: [name] [name] [name] is a 59 year old with history of ETOH was found to have EF 10% by echo. He had exertional dyspnea and chest pain, taken for LHC/RHC 5/10 showing nonobstructive CAD, elevated filling pressures (but not marked elevation), and low cardiac index. Unfortunately he is not a candidate for advanced therapies/home inotropes due to ETOH abuse. He was admitted to optimize HF/HFmeds. He was diuresed with IV lasix and transitioned to po lasix. Overall he diuresed 9 pounds. He continued on low dose carvedilol, lisinopril, bidil, and spironolactone.   [name] [name] returns for follow up. Overall feeling ok.  Denies PND/Orthopnea. SOB after walking 1 block. Weight at home 156-158 pounds.  SOB with inclined surfaces.  Appetite ok.  Not drinking alcohol. Taking all medications. Continues 1 PPD per day. Followed by paramedicine.   RHC/LHC:  02/04/2015  RA mean 10 RV 51/21 PA 49/28, mean 35 PCWP mean 25 LV 111/20 AO 111/62 Oxygen saturations: PA 51% AO 94% Cardiac Output (Fick) 2.84  Cardiac Index (Fick) 1.41 PVR 3.5 WU Nonobstructive CAD  Labs (5/16): K 3.8, creatinine 0.94 => 1.19, HCT 48.4, LDL 121, HDL 50, digoxin 0.3, BNP 2343 => 367 Labs (6/16): K 4, creatinine 1.06 Labs 04/18/15; K 4.3 Creatinine 0.91 Labs 8/10/29016: K 4.5 Creatinine 0.88    PMH: 1. Low back pain 2. HTN 3. Hyperlipidemia 4. H/o cocaine abuse: Not active. 5. ETOH abuse: 1 pint/day 6. CAD: Coronary CT angiogram in 9/09 showed possible significant obstructive disease in the circumflex. He did not have cardiac cath.  7. Cardiomyopathy: Echo (9/09) with EF 35-40%. Echo (5/16) with EF 10%, mild LV dilation, grade II diastolic dysfunction, mildly dilated RV with moderately decreased systolic function. LHC/RHC (5/16): Nonobstructive coronary disease, mean RA 10, PA  49/28, mean PCWP 25, CI 1.4.  8. Active smoker 9. PAD: ABI L normal, R mildly decreased.   SH: Lives in [place], works as Dealer, lives with son, smokes 1 ppd, drank 1 pint/day liquor => quit 5/16, prior cocaine use.   FH: Brother with MI in his 70s, another brother with ?SCD, mother with CVA.   ROS: All systems reviewed and negative except as per HPI.   Current Outpatient Prescriptions  Medication Sig Dispense Refill  . aspirin EC 81 MG EC tablet Take 1 tablet (81 mg total) by mouth daily. 30 tablet 6  . carvedilol (COREG) 3.125 MG tablet Take 2 tablets (6.25 mg total) by mouth 2 (two) times daily with a meal. 120 tablet 6  . digoxin (LANOXIN) 0.125 MG tablet Take 1 tablet (0.125 mg total) by mouth daily. 30 tablet 6  . isosorbide-hydrALAZINE (BIDIL) 20-37.5 MG per tablet Take 1 tablet by mouth 3 (three) times daily. 90 tablet 6  . rosuvastatin (CRESTOR) 20 MG tablet Take 1 tablet (20 mg total) by mouth daily. 90 tablet 3  . sacubitril-valsartan (ENTRESTO) 24-26 MG Take 1 tablet by mouth 2 (two) times daily. 60 tablet 3  . spironolactone (ALDACTONE) 25 MG tablet Take 1 tablet (25 mg total) by mouth daily. 30 tablet 3  . traMADol (ULTRAM) 50 MG tablet Take 50 mg by mouth every 8 (eight) hours as needed for moderate pain.    . furosemide (LASIX) 20 MG tablet Take 1 tablet (20 mg total) by mouth daily as needed for fluid or edema. (Patient not taking: Reported on 06/27/2015) 30  tablet 3   No current facility-administered medications for this encounter.    Filed Vitals:   06/27/15 0951  BP: 112/60  Pulse: 63  Weight: 161 lb 4 oz (73.143 kg)  SpO2: 99%    PHYSICAL EXAM: General:  Thin appearing. No resp difficulty. Ambulated slowly into the clinic.  HEENT: normal Neck: supple. JVP flat. Carotids 2+ bilaterally; no bruits. No lymphadenopathy or thryomegaly appreciated.  Cor: PMI laterally displaced. Regular rate & rhythm. No rubs, gallops or murmurs.  Unable to palpate pedal  pulses.  Lungs: clear Abdomen: soft, nontender, nondistended. No hepatosplenomegaly. No bruits or masses. Good bowel sounds. Extremities: no cyanosis, clubbing, rash, edema Neuro: alert & orientedx3, cranial nerves grossly intact. Moves all 4 extremities w/o difficulty. Affect pleasant.  ASSESSMENT & PLAN: 1. Chronic Systolic HF: Nonischemic cardiomyopathy with low output per RHC/LHC in 5/16.  Possible ETOH cardiomyopathy.   NYHA class III symptoms.   Limited reading ability. We will need to keep medication regimen as simple as possible.   Euvolemic on exam. Off lasix. Restart 12.5 mg spironolactone today.  - Continue Coreg 6.25 mg bid will not increase as his heart rate is 63. .  -Continue  Bidil 1 tab tid - Continue entresto 24-26 mg twice a day.  - Continue digoxin 0.125 daily, recent digoxin level was ok.  - Todays ECHO is a little better with EF 20-25% RV normal.He is agreeable to EP eval.   - Refer to EP for ICD. No longer drinking alcohol (narrow QRS, no CRT).   May need CPX to determine limitations HF versus pulmonary. Consider at next visit.  Check BMET BNP today.  2. ETOH Abuse: Former heavy drinker, he has quit.  Congratulated 3. Current Smoker: Continues smokes 1PPD. Encouraged to stop smoking. He declines smoking cessation.  4. PAD: Mild claudication.  ABIs not markedly abnormal.  Encouraged him to quit smoking.  Continue ASA 81 and Crestor 20 daily with vascular disease.   5. Hyperlipidemia: Check lipids today, goal LDL < 70 with PAD.   5. CAD: Nonobstructive. No evidence of ischemia.     Follow up 4-6 weeks.   CLEGG,AMY NP-C 06/27/2015

## 2015-06-27 NOTE — Patient Instructions (Addendum)
DECREASE Spironolactone to 12.5 mg, one half tab daily  You have been referred to [facility] Electrophysiology for evaluation of a ICD     [address] 3rd floor [phone]            Appointment:______________  Labs today  Your physician recommends that you schedule a follow-up appointment in: 6 weeks  Do the following things EVERYDAY: 1) Weigh yourself in the morning before breakfast. Write it down and keep it in a log. 2) Take your medicines as prescribed 3) Eat low salt foods-Limit salt (sodium) to 2000 mg per day.  4) Stay as active as you can everyday 5) Limit all fluids for the day to less than 2 liters 6)

## 2015-06-27 NOTE — Progress Notes (Signed)
Echocardiogram 2D Echocardiogram limited has been performed.  [name] [name] 06/27/2015, 9:41 AM

## 2015-06-30 ENCOUNTER — Encounter: Payer: Self-pay | Admitting: *Deleted

## 2015-06-30 ENCOUNTER — Encounter: Payer: Self-pay | Admitting: Internal Medicine

## 2015-06-30 ENCOUNTER — Ambulatory Visit (INDEPENDENT_AMBULATORY_CARE_PROVIDER_SITE_OTHER): Payer: 59 | Admitting: Internal Medicine

## 2015-06-30 VITALS — BP 110/60 | HR 78 | Ht 73.0 in | Wt 161.4 lb

## 2015-06-30 DIAGNOSIS — I5022 Chronic systolic (congestive) heart failure: Secondary | ICD-10-CM

## 2015-06-30 DIAGNOSIS — I429 Cardiomyopathy, unspecified: Secondary | ICD-10-CM | POA: Diagnosis not present

## 2015-06-30 DIAGNOSIS — Z01812 Encounter for preprocedural laboratory examination: Secondary | ICD-10-CM

## 2015-06-30 DIAGNOSIS — I428 Other cardiomyopathies: Secondary | ICD-10-CM

## 2015-06-30 HISTORY — DX: Other cardiomyopathies: I42.8

## 2015-06-30 LAB — CBC WITH DIFFERENTIAL/PLATELET
Basophils Absolute: 0.1 10*3/uL (ref 0.0–0.1)
Basophils Relative: 1 % (ref 0–1)
EOS PCT: 3 % (ref 0–5)
Eosinophils Absolute: 0.2 10*3/uL (ref 0.0–0.7)
HEMATOCRIT: 45.7 % (ref 39.0–52.0)
HEMOGLOBIN: 14.9 g/dL (ref 13.0–17.0)
LYMPHS ABS: 2.5 10*3/uL (ref 0.7–4.0)
LYMPHS PCT: 44 % (ref 12–46)
MCH: 29.6 pg (ref 26.0–34.0)
MCHC: 32.6 g/dL (ref 30.0–36.0)
MCV: 90.7 fL (ref 78.0–100.0)
MONO ABS: 0.6 10*3/uL (ref 0.1–1.0)
MONOS PCT: 11 % (ref 3–12)
MPV: 10.5 fL (ref 8.6–12.4)
NEUTROS ABS: 2.3 10*3/uL (ref 1.7–7.7)
Neutrophils Relative %: 41 % — ABNORMAL LOW (ref 43–77)
Platelets: 248 10*3/uL (ref 150–400)
RBC: 5.04 MIL/uL (ref 4.22–5.81)
RDW: 13.8 % (ref 11.5–15.5)
WBC: 5.6 10*3/uL (ref 4.0–10.5)

## 2015-06-30 NOTE — Patient Instructions (Addendum)
Medication Instructions:  Your physician recommends that you continue on your current medications as directed. Please refer to the Current Medication list given to you today.  Labwork: Pre procedure labs today: BMET, CBCD, & Digoxin level  Testing/Procedures: Your physician has recommended that you have a defibrillator inserted. An implantable cardioverter defibrillator (ICD) is a small device that is placed in your chest or, in rare cases, your abdomen. This device uses electrical pulses or shocks to help control life-threatening, irregular heartbeats that could lead the heart to suddenly stop beating (sudden cardiac arrest). Leads are attached to the ICD that goes into your heart. This is done in the hospital and usually requires an overnight stay. Please see the instruction sheet given to you today for more information.  Follow-Up: Your wound check is scheduled for 07/24/15 at 9:30 a.m. at [address].   Any Other Special Instructions Will Be Listed Below (If Applicable). Thank you for choosing [facility]!!

## 2015-06-30 NOTE — Progress Notes (Signed)
ELECTROPHYSIOLOGY CONSULT NOTE  Patient ID: [name] [name], MRN: [id number], DOB/AGE: 1956/03/05 59 y.o. Admit date: (Not on file) Date of Consult: 06/30/2015  Primary Physician: Angelica Chessman, MD Primary Cardiologist: DB  Chief Complaint: ICD   HPI [name] [name] is a 59 y.o. male  Seen in consideration of ICD implantation.  He has a history of nonischemic myopathy with catheterization 5/16. He has had class III symptoms with dyspnea on exertion at less than 100 feet. Interestingly he does not have nocturnal dyspnea orthopnea or peripheral edema. He denies chest pain. He's had no syncope or palpitations.   In the past has been felt that alcohol likely was contributing to his cardiomyopathy; he is currently sober. He smokes however up to a pack a day.  Echocardiogram 9/16 demonstrated ejection fraction of 20-25%    Past Medical History  Diagnosis Date  . Hypertension   . Coronary artery disease   . Nonischemic cardiomyopathy ([facility]) 06/30/2015  . Chronic systolic CHF (congestive heart failure) ([facility]) 01/31/2015      Surgical History:  Past Surgical History  Procedure Laterality Date  . Chalazion excision Left 01/16/2014    Procedure: MINOR EXCISION OF CHALAZION;  Surgeon: Myrtha Mantis., MD;  Location: [facility];  Service: Ophthalmology;  Laterality: Left;  . Cardiac catheterization N/A 02/04/2015    Procedure: Right/Left Heart Cath and Coronary Angiography;  Surgeon: Larey Dresser, MD;  Location: [facility];  Service: Cardiovascular;  Laterality: N/A;     Home Meds: Prior to Admission medications   Medication Sig Start Date End Date Taking? Authorizing Provider  aspirin EC 81 MG EC tablet Take 1 tablet (81 mg total) by mouth daily. 02/07/15   Amy D Ninfa Meeker, NP  carvedilol (COREG) 3.125 MG tablet Take 2 tablets (6.25 mg total) by mouth 2 (two) times daily with a meal. 02/28/15   Amy D Clegg, NP  digoxin (LANOXIN) 0.125 MG tablet Take 1  tablet (0.125 mg total) by mouth daily. 02/07/15   Amy D Ninfa Meeker, NP  furosemide (LASIX) 20 MG tablet Take 1 tablet (20 mg total) by mouth daily as needed for fluid or edema. Patient not taking: Reported on 06/27/2015 04/24/15   Shaune Pascal Bensimhon, MD  isosorbide-hydrALAZINE (BIDIL) 20-37.5 MG per tablet Take 1 tablet by mouth 3 (three) times daily. 02/07/15   Amy D Clegg, NP  rosuvastatin (CRESTOR) 20 MG tablet Take 1 tablet (20 mg total) by mouth daily. 01/27/15   Tresa Garter, MD  sacubitril-valsartan (ENTRESTO) 24-26 MG Take 1 tablet by mouth 2 (two) times daily. 05/05/15   Jolaine Artist, MD  spironolactone (ALDACTONE) 25 MG tablet Take 0.5 tablets (12.5 mg total) by mouth daily. 06/27/15   Larey Dresser, MD  traMADol (ULTRAM) 50 MG tablet Take 50 mg by mouth every 8 (eight) hours as needed for moderate pain.    Historical Provider, MD    Allergies: No Known Allergies  Social History   Social History  . Marital Status: Single    Spouse Name: N/A  . Number of Children: N/A  . Years of Education: N/A   Occupational History  . Not on file.   Social History Main Topics  . Smoking status: Current Every Day Smoker  . Smokeless tobacco: Not on file  . Alcohol Use: Yes  . Drug Use: Not on file  . Sexual Activity: Not on file   Other Topics Concern  . Not on file   Social  History Narrative     No family history on file.   ROS:  Please see the history of present illness.     All other systems reviewed and negative.    Physical Exam:   Blood pressure 110/60, pulse 78, height 6\' 1"  (1.854 m), weight 161 lb 6.4 oz (73.211 kg). General: Well developed, well nourished male in no acute distress. Head: Normocephalic, atraumatic, sclera non-icteric, no xanthomas, nares are without discharge. EENT: normal  Lymph Nodes:  none Neck: Negative for carotid bruits. JVD not elevated. Back:without scoliosis kyphosis Lungs: Clear bilaterally to auscultation without wheezes, rales, or  rhonchi. Breathing is unlabored. Heart: RRR with S1 S2.  2/6 systolic  murmur . No rubs, or gallops appreciated. Abdomen: Soft, non-tender, non-distended with normoactive bowel sounds. No hepatomegaly. No rebound/guarding. No obvious abdominal masses. Msk:  Strength and tone appear normal for age. Extremities: No clubbing or cyanosis. No edema.  Distal pedal pulses are 2+ and equal bilaterally. Skin: Warm and Dry Neuro: Alert and oriented X 3. CN III-XII intact Grossly normal sensory and motor function . Psych:  Responds to questions appropriately with a normal affect.      Labs: Cardiac Enzymes No results for input(s): CKTOTAL, CKMB, TROPONINI in the last 72 hours. CBC Lab Results  Component Value Date   WBC 6.4 04/18/2015   HGB 14.8 04/18/2015   HCT 42.9 04/18/2015   MCV 92.5 04/18/2015   PLT 196 04/18/2015   PROTIME: No results for input(s): LABPROT, INR in the last 72 hours. Chemistry   Recent Labs Lab 06/27/15 1041  NA 140  K 3.8  CL 105  CO2 28  BUN 8  CREATININE 0.89  CALCIUM 10.1  GLUCOSE 108*   Lipids Lab Results  Component Value Date   CHOL 163 06/27/2015   HDL 53 01/27/2015   LDLCALC 121* 01/27/2015   TRIG 109 01/27/2015   BNP PRO B NATRIURETIC PEPTIDE (BNP)  Date/Time Value Ref Range Status  06/23/2008 12:45 PM 40.0  Final   Thyroid Function Tests: No results for input(s): TSH, T4TOTAL, T3FREE, THYROIDAB in the last 72 hours.  Invalid input(s): FREET3 Miscellaneous No results found for: DDIMER  Radiology/Studies:  No results found.  EKG: * Sinus rhythm at 78 neck supple intervals 18/09/39 PACs-frequent   Assessment and Plan:  Nonischemic cardiomyopathy  Congestive heart failure-class III  Smoking daily  PACs-frequent    the patient is on guideline directed medical therapy with persistent left ventricular dysfunction. QRS is narrow. He is a candidate for ICD implantation for primary prevention but no reason to anticipate CRT. Have  reviewed the potential benefits and risks of ICD implantation including but not limited to death, perforation of heart or lung, lead dislodgement, infection,  device malfunction and inappropriate shocks.  The patient expresses understanding  and is willing to proceed.    We had a lengthy discussion regarding smoking cessation. We discussed strategies including tobacco patches from 1 800 [phone] NOW the use of lozenges and behavior modification. I explained to him that it could be linked to his dyspnea on exertion as well as lower extremity claudication.        Virl Axe

## 2015-07-01 LAB — BASIC METABOLIC PANEL
BUN: 14 mg/dL (ref 7–25)
CHLORIDE: 104 mmol/L (ref 98–110)
CO2: 28 mmol/L (ref 20–31)
Calcium: 10 mg/dL (ref 8.6–10.3)
Creat: 0.95 mg/dL (ref 0.70–1.33)
Glucose, Bld: 64 mg/dL — ABNORMAL LOW (ref 65–99)
POTASSIUM: 4.6 mmol/L (ref 3.5–5.3)
SODIUM: 136 mmol/L (ref 135–146)

## 2015-07-01 LAB — DIGOXIN LEVEL: Digoxin Level: 0.5 ug/L — ABNORMAL LOW (ref 0.8–2.0)

## 2015-07-04 ENCOUNTER — Institutional Professional Consult (permissible substitution): Payer: 59 | Admitting: Cardiology

## 2015-07-08 MED ORDER — CEFAZOLIN SODIUM-DEXTROSE 2-3 GM-% IV SOLR
2.0000 g | INTRAVENOUS | Status: AC
  Administered 2015-07-09: 2 g via INTRAVENOUS

## 2015-07-08 MED ORDER — SODIUM CHLORIDE 0.9 % IR SOLN
80.0000 mg | Status: AC
  Administered 2015-07-09: 80 mg

## 2015-07-09 ENCOUNTER — Ambulatory Visit (HOSPITAL_COMMUNITY)
Admission: RE | Admit: 2015-07-09 | Discharge: 2015-07-10 | Disposition: A | Discharge status: Home / Self Care | Payer: 59 | Source: Ambulatory Visit | Attending: Internal Medicine | Admitting: Internal Medicine

## 2015-07-09 ENCOUNTER — Encounter (HOSPITAL_COMMUNITY): Payer: Self-pay | Admitting: General Practice

## 2015-07-09 ENCOUNTER — Encounter (HOSPITAL_COMMUNITY)
Admission: RE | Disposition: A | Discharge status: Home / Self Care | Payer: Self-pay | Source: Ambulatory Visit | Attending: Internal Medicine

## 2015-07-09 DIAGNOSIS — I10 Essential (primary) hypertension: Secondary | ICD-10-CM | POA: Diagnosis not present

## 2015-07-09 DIAGNOSIS — F17200 Nicotine dependence, unspecified, uncomplicated: Secondary | ICD-10-CM | POA: Diagnosis not present

## 2015-07-09 DIAGNOSIS — I2510 Atherosclerotic heart disease of native coronary artery without angina pectoris: Secondary | ICD-10-CM | POA: Insufficient documentation

## 2015-07-09 DIAGNOSIS — I429 Cardiomyopathy, unspecified: Secondary | ICD-10-CM

## 2015-07-09 DIAGNOSIS — E785 Hyperlipidemia, unspecified: Secondary | ICD-10-CM | POA: Insufficient documentation

## 2015-07-09 DIAGNOSIS — Z23 Encounter for immunization: Secondary | ICD-10-CM | POA: Diagnosis not present

## 2015-07-09 DIAGNOSIS — I5022 Chronic systolic (congestive) heart failure: Secondary | ICD-10-CM | POA: Diagnosis not present

## 2015-07-09 DIAGNOSIS — Z7982 Long term (current) use of aspirin: Secondary | ICD-10-CM | POA: Diagnosis not present

## 2015-07-09 DIAGNOSIS — Z959 Presence of cardiac and vascular implant and graft, unspecified: Secondary | ICD-10-CM

## 2015-07-09 DIAGNOSIS — I428 Other cardiomyopathies: Secondary | ICD-10-CM

## 2015-07-09 HISTORY — PX: EP IMPLANTABLE DEVICE: SHX172B

## 2015-07-09 HISTORY — PX: CARDIAC DEFIBRILLATOR PLACEMENT: SHX171

## 2015-07-09 HISTORY — DX: Presence of automatic (implantable) cardiac defibrillator: Z95.810

## 2015-07-09 LAB — SURGICAL PCR SCREEN
MRSA, PCR: NEGATIVE
Staphylococcus aureus: NEGATIVE

## 2015-07-09 SURGERY — ICD IMPLANT

## 2015-07-09 MED ORDER — INFLUENZA VAC SPLIT QUAD 0.5 ML IM SUSY
0.5000 mL | PREFILLED_SYRINGE | INTRAMUSCULAR | Status: AC
  Administered 2015-07-10: 0.5 mL via INTRAMUSCULAR
  Filled 2015-07-09: qty 0.5

## 2015-07-09 MED ORDER — LIDOCAINE HCL (PF) 1 % IJ SOLN
INTRAMUSCULAR | Status: AC
  Filled 2015-07-09: qty 30

## 2015-07-09 MED ORDER — DIGOXIN 125 MCG PO TABS
0.1250 mg | ORAL_TABLET | Freq: Every day | ORAL | Status: DC
  Filled 2015-07-09: qty 1

## 2015-07-09 MED ORDER — SPIRONOLACTONE 25 MG PO TABS
12.5000 mg | ORAL_TABLET | Freq: Every day | ORAL | Status: DC
  Administered 2015-07-10: 12.5 mg via ORAL
  Filled 2015-07-09: qty 1

## 2015-07-09 MED ORDER — FENTANYL CITRATE (PF) 100 MCG/2ML IJ SOLN
INTRAMUSCULAR | Status: DC | PRN
  Administered 2015-07-09: 25 ug via INTRAVENOUS
  Administered 2015-07-09: 50 ug via INTRAVENOUS

## 2015-07-09 MED ORDER — CARVEDILOL 6.25 MG PO TABS
6.2500 mg | ORAL_TABLET | Freq: Two times a day (BID) | ORAL | Status: DC
  Administered 2015-07-09 – 2015-07-10 (×2): 6.25 mg via ORAL
  Filled 2015-07-09 (×2): qty 1

## 2015-07-09 MED ORDER — ASPIRIN EC 81 MG PO TBEC
81.0000 mg | DELAYED_RELEASE_TABLET | Freq: Every day | ORAL | Status: DC
  Administered 2015-07-10: 81 mg via ORAL
  Filled 2015-07-09: qty 1

## 2015-07-09 MED ORDER — CEFAZOLIN SODIUM 1-5 GM-% IV SOLN
1.0000 g | Freq: Four times a day (QID) | INTRAVENOUS | Status: AC
  Administered 2015-07-09 – 2015-07-10 (×3): 1 g via INTRAVENOUS
  Filled 2015-07-09 (×3): qty 50

## 2015-07-09 MED ORDER — MIDAZOLAM HCL 5 MG/5ML IJ SOLN
INTRAMUSCULAR | Status: AC
  Filled 2015-07-09: qty 25

## 2015-07-09 MED ORDER — CEFAZOLIN SODIUM-DEXTROSE 2-3 GM-% IV SOLR
INTRAVENOUS | Status: AC
  Filled 2015-07-09: qty 50

## 2015-07-09 MED ORDER — SODIUM CHLORIDE 0.9 % IR SOLN
Status: AC
  Filled 2015-07-09: qty 2

## 2015-07-09 MED ORDER — ONDANSETRON HCL 4 MG/2ML IJ SOLN: 4.0000 mg | Freq: Four times a day (QID) | INTRAMUSCULAR | Status: DC | PRN

## 2015-07-09 MED ORDER — ACETAMINOPHEN 325 MG PO TABS: 325.0000 mg | ORAL_TABLET | ORAL | Status: DC | PRN

## 2015-07-09 MED ORDER — HEPARIN (PORCINE) IN NACL 2-0.9 UNIT/ML-% IJ SOLN
INTRAMUSCULAR | Status: AC
  Filled 2015-07-09: qty 500

## 2015-07-09 MED ORDER — SODIUM CHLORIDE 0.9 % IV SOLN: INTRAVENOUS | Status: AC

## 2015-07-09 MED ORDER — FENTANYL CITRATE (PF) 100 MCG/2ML IJ SOLN
INTRAMUSCULAR | Status: AC
  Filled 2015-07-09: qty 4

## 2015-07-09 MED ORDER — ROSUVASTATIN CALCIUM 20 MG PO TABS
20.0000 mg | ORAL_TABLET | Freq: Every day | ORAL | Status: DC
  Administered 2015-07-10: 20 mg via ORAL
  Filled 2015-07-09: qty 1

## 2015-07-09 MED ORDER — SACUBITRIL-VALSARTAN 24-26 MG PO TABS
1.0000 | ORAL_TABLET | Freq: Two times a day (BID) | ORAL | Status: DC
  Administered 2015-07-09 – 2015-07-10 (×2): 1 via ORAL
  Filled 2015-07-09 (×3): qty 1

## 2015-07-09 MED ORDER — HEPARIN (PORCINE) IN NACL 2-0.9 UNIT/ML-% IJ SOLN
INTRAMUSCULAR | Status: DC | PRN
  Administered 2015-07-09: 15:00:00

## 2015-07-09 MED ORDER — SODIUM CHLORIDE 0.9 % IV SOLN
INTRAVENOUS | Status: DC
  Administered 2015-07-09: 13:00:00 via INTRAVENOUS

## 2015-07-09 MED ORDER — FUROSEMIDE 20 MG PO TABS: 20.0000 mg | ORAL_TABLET | Freq: Every day | ORAL | Status: DC | PRN

## 2015-07-09 MED ORDER — TRAMADOL HCL 50 MG PO TABS
50.0000 mg | ORAL_TABLET | Freq: Three times a day (TID) | ORAL | Status: DC | PRN
  Administered 2015-07-09: 50 mg via ORAL
  Filled 2015-07-09: qty 1

## 2015-07-09 MED ORDER — ISOSORB DINITRATE-HYDRALAZINE 20-37.5 MG PO TABS
1.0000 | ORAL_TABLET | Freq: Three times a day (TID) | ORAL | Status: DC
  Administered 2015-07-09 – 2015-07-10 (×3): 1 via ORAL
  Filled 2015-07-09 (×3): qty 1

## 2015-07-09 MED ORDER — LIDOCAINE HCL (PF) 1 % IJ SOLN
INTRAMUSCULAR | Status: DC | PRN
  Administered 2015-07-09: 35 mL via INTRADERMAL

## 2015-07-09 MED ORDER — MUPIROCIN 2 % EX OINT
TOPICAL_OINTMENT | CUTANEOUS | Status: AC
  Administered 2015-07-09: 1 via NASAL
  Filled 2015-07-09: qty 22

## 2015-07-09 MED ORDER — MUPIROCIN 2 % EX OINT
TOPICAL_OINTMENT | Freq: Two times a day (BID) | CUTANEOUS | Status: DC
  Administered 2015-07-09: 1 via NASAL
  Administered 2015-07-09 – 2015-07-10 (×2): via NASAL
  Filled 2015-07-09: qty 22

## 2015-07-09 MED ORDER — MIDAZOLAM HCL 5 MG/5ML IJ SOLN
INTRAMUSCULAR | Status: DC | PRN
  Administered 2015-07-09 (×2): 1 mg via INTRAVENOUS
  Administered 2015-07-09: 2 mg via INTRAVENOUS

## 2015-07-09 SURGICAL SUPPLY — 6 items
CABLE SURGICAL S-101-97-12 (CABLE) ×2 IMPLANT
ICD ELLIPSE VR CD1411-36C (ICD Generator) ×2 IMPLANT
LEAD DURATA 7122-65CM (Lead) ×2 IMPLANT
PAD DEFIB LIFELINK (PAD) ×2 IMPLANT
SHEATH CLASSIC 8F (SHEATH) ×2 IMPLANT
TRAY PACEMAKER INSERTION (CUSTOM PROCEDURE TRAY) ×2 IMPLANT

## 2015-07-09 NOTE — Interval H&P Note (Signed)
ICD Criteria  Current LVEF:20%. Within 12 months prior to implant: Yes   Heart failure history: Yes, Class III  Cardiomyopathy history: Yes, Non-Ischemic Cardiomyopathy.  Atrial Fibrillation/Atrial Flutter: No.  Ventricular tachycardia history: No.  Cardiac arrest history: No.  History of syndromes with risk of sudden death: No.  Previous ICD: No.  Current ICD indication: Primary  PPM indication: No.   Class I or II Bradycardia indication present: No  Beta Blocker therapy for 3 or more months: Yes, prescribed.   Ace Inhibitor/ARB therapy for 3 or more months: Yes, prescribed.   History and Physical Interval Note:  07/09/2015 3:02 PM  [name] [name]  has presented today for surgery, with the diagnosis of chf  The various methods of treatment have been discussed with the patient and family. After consideration of risks, benefits and other options for treatment, the patient has consented to  Procedure(s): ICD Implant (N/A) as a surgical intervention .  The patient's history has been reviewed, patient examined, no change in status, stable for surgery.  I have reviewed the patient's chart and labs.  Questions were answered to the patient's satisfaction.     [name] [name]

## 2015-07-09 NOTE — H&P (View-Only) (Signed)
ELECTROPHYSIOLOGY CONSULT NOTE  Patient ID: [name] [name], MRN: [id number], DOB/AGE: Aug 25, 1956 59 y.o. Admit date: (Not on file) Date of Consult: 06/30/2015  Primary Physician: Angelica Chessman, MD Primary Cardiologist: DB  Chief Complaint: ICD   HPI [name] [name] is a 59 y.o. male  Seen in consideration of ICD implantation.  He has a history of nonischemic myopathy with catheterization 5/16. He has had class III symptoms with dyspnea on exertion at less than 100 feet. Interestingly he does not have nocturnal dyspnea orthopnea or peripheral edema. He denies chest pain. He's had no syncope or palpitations.   In the past has been felt that alcohol likely was contributing to his cardiomyopathy; he is currently sober. He smokes however up to a pack a day.  Echocardiogram 9/16 demonstrated ejection fraction of 20-25%    Past Medical History  Diagnosis Date  . Hypertension   . Coronary artery disease   . Nonischemic cardiomyopathy ([place]) 06/30/2015  . Chronic systolic CHF (congestive heart failure) ([place]) 01/31/2015      Surgical History:  Past Surgical History  Procedure Laterality Date  . Chalazion excision Left 01/16/2014    Procedure: MINOR EXCISION OF CHALAZION;  Surgeon: Myrtha Mantis., MD;  Location: [facility];  Service: Ophthalmology;  Laterality: Left;  . Cardiac catheterization N/A 02/04/2015    Procedure: Right/Left Heart Cath and Coronary Angiography;  Surgeon: Larey Dresser, MD;  Location: [facility];  Service: Cardiovascular;  Laterality: N/A;     Home Meds: Prior to Admission medications   Medication Sig Start Date End Date Taking? Authorizing Provider  aspirin EC 81 MG EC tablet Take 1 tablet (81 mg total) by mouth daily. 02/07/15   Amy D Ninfa Meeker, NP  carvedilol (COREG) 3.125 MG tablet Take 2 tablets (6.25 mg total) by mouth 2 (two) times daily with a meal. 02/28/15   Amy D Clegg, NP  digoxin (LANOXIN) 0.125 MG tablet Take 1  tablet (0.125 mg total) by mouth daily. 02/07/15   Amy D Ninfa Meeker, NP  furosemide (LASIX) 20 MG tablet Take 1 tablet (20 mg total) by mouth daily as needed for fluid or edema. Patient not taking: Reported on 06/27/2015 04/24/15   Shaune Pascal Bensimhon, MD  isosorbide-hydrALAZINE (BIDIL) 20-37.5 MG per tablet Take 1 tablet by mouth 3 (three) times daily. 02/07/15   Amy D Clegg, NP  rosuvastatin (CRESTOR) 20 MG tablet Take 1 tablet (20 mg total) by mouth daily. 01/27/15   Tresa Garter, MD  sacubitril-valsartan (ENTRESTO) 24-26 MG Take 1 tablet by mouth 2 (two) times daily. 05/05/15   Jolaine Artist, MD  spironolactone (ALDACTONE) 25 MG tablet Take 0.5 tablets (12.5 mg total) by mouth daily. 06/27/15   Larey Dresser, MD  traMADol (ULTRAM) 50 MG tablet Take 50 mg by mouth every 8 (eight) hours as needed for moderate pain.    Historical Provider, MD    Allergies: No Known Allergies  Social History   Social History  . Marital Status: Single    Spouse Name: N/A  . Number of Children: N/A  . Years of Education: N/A   Occupational History  . Not on file.   Social History Main Topics  . Smoking status: Current Every Day Smoker  . Smokeless tobacco: Not on file  . Alcohol Use: Yes  . Drug Use: Not on file  . Sexual Activity: Not on file   Other Topics Concern  . Not on file   Social  History Narrative     No family history on file.   ROS:  Please see the history of present illness.     All other systems reviewed and negative.    Physical Exam:   Blood pressure 110/60, pulse 78, height 6\' 1"  (1.854 m), weight 161 lb 6.4 oz (73.211 kg). General: Well developed, well nourished male in no acute distress. Head: Normocephalic, atraumatic, sclera non-icteric, no xanthomas, nares are without discharge. EENT: normal  Lymph Nodes:  none Neck: Negative for carotid bruits. JVD not elevated. Back:without scoliosis kyphosis Lungs: Clear bilaterally to auscultation without wheezes, rales, or  rhonchi. Breathing is unlabored. Heart: RRR with S1 S2.  2/6 systolic  murmur . No rubs, or gallops appreciated. Abdomen: Soft, non-tender, non-distended with normoactive bowel sounds. No hepatomegaly. No rebound/guarding. No obvious abdominal masses. Msk:  Strength and tone appear normal for age. Extremities: No clubbing or cyanosis. No edema.  Distal pedal pulses are 2+ and equal bilaterally. Skin: Warm and Dry Neuro: Alert and oriented X 3. CN III-XII intact Grossly normal sensory and motor function . Psych:  Responds to questions appropriately with a normal affect.      Labs: Cardiac Enzymes No results for input(s): CKTOTAL, CKMB, TROPONINI in the last 72 hours. CBC Lab Results  Component Value Date   WBC 6.4 04/18/2015   HGB 14.8 04/18/2015   HCT 42.9 04/18/2015   MCV 92.5 04/18/2015   PLT 196 04/18/2015   PROTIME: No results for input(s): LABPROT, INR in the last 72 hours. Chemistry   Recent Labs Lab 06/27/15 1041  NA 140  K 3.8  CL 105  CO2 28  BUN 8  CREATININE 0.89  CALCIUM 10.1  GLUCOSE 108*   Lipids Lab Results  Component Value Date   CHOL 163 06/27/2015   HDL 53 01/27/2015   LDLCALC 121* 01/27/2015   TRIG 109 01/27/2015   BNP PRO B NATRIURETIC PEPTIDE (BNP)  Date/Time Value Ref Range Status  06/23/2008 12:45 PM 40.0  Final   Thyroid Function Tests: No results for input(s): TSH, T4TOTAL, T3FREE, THYROIDAB in the last 72 hours.  Invalid input(s): FREET3 Miscellaneous No results found for: DDIMER  Radiology/Studies:  No results found.  EKG: * Sinus rhythm at 78 neck supple intervals 18/09/39 PACs-frequent   Assessment and Plan:  Nonischemic cardiomyopathy  Congestive heart failure-class III  Smoking daily  PACs-frequent    the patient is on guideline directed medical therapy with persistent left ventricular dysfunction. QRS is narrow. He is a candidate for ICD implantation for primary prevention but no reason to anticipate CRT. Have  reviewed the potential benefits and risks of ICD implantation including but not limited to death, perforation of heart or lung, lead dislodgement, infection,  device malfunction and inappropriate shocks.  The patient expresses understanding  and is willing to proceed.    We had a lengthy discussion regarding smoking cessation. We discussed strategies including tobacco patches from 1 800 QUIT NOW the use of lozenges and behavior modification. I explained to him that it could be linked to his dyspnea on exertion as well as lower extremity claudication.        Virl Axe

## 2015-07-10 ENCOUNTER — Encounter (HOSPITAL_COMMUNITY): Payer: Self-pay | Admitting: Internal Medicine

## 2015-07-10 ENCOUNTER — Ambulatory Visit (HOSPITAL_COMMUNITY): Payer: 59

## 2015-07-10 DIAGNOSIS — I429 Cardiomyopathy, unspecified: Secondary | ICD-10-CM

## 2015-07-10 DIAGNOSIS — I10 Essential (primary) hypertension: Secondary | ICD-10-CM | POA: Diagnosis not present

## 2015-07-10 DIAGNOSIS — F17200 Nicotine dependence, unspecified, uncomplicated: Secondary | ICD-10-CM | POA: Diagnosis not present

## 2015-07-10 DIAGNOSIS — I2510 Atherosclerotic heart disease of native coronary artery without angina pectoris: Secondary | ICD-10-CM | POA: Diagnosis not present

## 2015-07-10 MED ORDER — TRAMADOL HCL 50 MG PO TABS: 50.0000 mg | ORAL_TABLET | Freq: Three times a day (TID) | ORAL | Status: DC | PRN

## 2015-07-10 NOTE — Discharge Instructions (Signed)
° ° °  Supplemental Discharge Instructions for  Pacemaker/Defibrillator Patients  Activity No heavy lifting or vigorous activity with your left/right arm for 6 to 8 weeks.  Do not raise your left/right arm above your head for one week.  Gradually raise your affected arm as drawn below.           __     07-14-15                  07-15-15              07-16-15                07-17-15  NO DRIVING for   1 week  ; you may begin driving on   [id number]  .  WOUND CARE - Keep the wound area clean and dry.  - No bandage is needed on the site.  DO  NOT apply any creams, oils, or ointments to the wound area. - If you notice any drainage or discharge from the wound, any swelling or bruising at the site, or you develop a fever > 101? F after you are discharged home, call the office at once.  Special Instructions - You are still able to use cellular telephones; use the ear opposite the side where you have your pacemaker/defibrillator.  Avoid carrying your cellular phone near your device. - When traveling through airports, show security personnel your identification card to avoid being screened in the metal detectors.  Ask the security personnel to use the hand wand. - Avoid arc welding equipment, MRI testing (magnetic resonance imaging), TENS units (transcutaneous nerve stimulators).  Call the office for questions about other devices. - Avoid electrical appliances that are in poor condition or are not properly grounded. - Microwave ovens are safe to be near or to operate.  Additional information for defibrillator patients should your device go off: - If your device goes off ONCE and you feel fine afterward, notify the device clinic nurses. - If your device goes off ONCE and you do not feel well afterward, call 911. - If your device goes off TWICE, call 911. - If your device goes off THREE times in one day, call 911.  DO NOT DRIVE YOURSELF OR A FAMILY MEMBER WITH A DEFIBRILLATOR TO THE HOSPITAL--CALL  911.

## 2015-07-10 NOTE — Discharge Summary (Signed)
ELECTROPHYSIOLOGY PROCEDURE DISCHARGE SUMMARY    Patient ID: [name] [name],  MRN: [id number], DOB/AGE: 60-Sep-1957 60 y.o.  Admit date: 07/09/2015 Discharge date: 07/10/2015  Primary Care Physician: Angelica Chessman, MD Primary Cardiologist: Aundra Dubin Electrophysiologist: Caryl Comes  Primary Discharge Diagnosis:  NICM s/p ICD implantation this admission  Secondary Discharge Diagnosis:  1.  Chronic systolic heart failure 2.  Prior ETOH abuse 3.  PAD 4.  Hyperlipidemia 5.  HTN 6.  Tobacco abuse   No Known Allergies   Procedures This Admission:  1.  Implantation of a STJ single chamber ICD on 07-09-15 by Dr Caryl Comes.  See op note for full details. DFT's were deferred at time of implant.  There were no immediate post procedure complications. 2.  CXR on 07-10-15 demonstrated no pneumothorax status post device implantation.   Brief HPI: [name] [name] is a 59 y.o. male was referred to electrophysiology in the outpatient setting for consideration of ICD implantation.  Past medical history includes NICM, CHF, and prior ETOH abuse.  The patient has persistent LV dysfunction despite guideline directed therapy.  Risks, benefits, and alternatives to ICD implantation were reviewed with the patient who wished to proceed.   Hospital Course:  The patient was admitted and underwent implantation of a STJ single chamber ICD with details as outlined above. He was monitored on telemetry overnight which demonstrated sinus rhythm with PVC's.  Left chest was without hematoma or ecchymosis.  The device was interrogated and found to be functioning normally.  CXR was obtained and demonstrated no pneumothorax status post device implantation.  Wound care, arm mobility, and restrictions were reviewed with the patient.  The patient was examined and considered stable for discharge to home.   The patient's discharge medications include an ARB (Valsartan) and beta blocker (Coreg).   Physical Exam: Filed Vitals:    07/09/15 1750 07/09/15 1830 07/09/15 2201 07/10/15 0526  BP: 119/64 130/60 139/73 117/67  Pulse:   55 56  Temp:   98.6 F (37 C) 97.7 F (36.5 C)  TempSrc:   Oral Oral  Resp:   18 16  Height:      Weight:    156 lb 4.8 oz (70.897 kg)  SpO2:   100% 100%    GEN- The patient is thin appearing, alert and oriented x 3 today.   HEENT: normocephalic, atraumatic; sclera clear, conjunctiva pink; hearing intact; oropharynx clear; neck supple Lungs- Clear to ausculation bilaterally, normal work of breathing.  No wheezes, rales, rhonchi Heart- Regular rate and rhythm  GI- soft, non-tender, non-distended, bowel sounds present  Extremities- no clubbing, cyanosis, or edema  MS- no significant deformity or atrophy Skin- warm and dry, no rash or lesion, left chest without hematoma/ecchymosis Psych- euthymic mood, full affect Neuro- strength and sensation are intact   Labs:   Lab Results  Component Value Date   WBC 5.6 06/30/2015   HGB 14.9 06/30/2015   HCT 45.7 06/30/2015   MCV 90.7 06/30/2015   PLT 248 06/30/2015   No results for input(s): NA, K, CL, CO2, BUN, CREATININE, CALCIUM, PROT, BILITOT, ALKPHOS, ALT, AST, GLUCOSE in the last 168 hours.  Invalid input(s): LABALBU  Discharge Medications:    Medication List    TAKE these medications        aspirin 81 MG EC tablet  Take 1 tablet (81 mg total) by mouth daily.     carvedilol 3.125 MG tablet  Commonly known as:  COREG  Take 2 tablets (6.25 mg total) by mouth 2 (  two) times daily with a meal.     digoxin 0.125 MG tablet  Commonly known as:  LANOXIN  Take 1 tablet (0.125 mg total) by mouth daily.     furosemide 20 MG tablet  Commonly known as:  LASIX  Take 1 tablet (20 mg total) by mouth daily as needed for fluid or edema.     isosorbide-hydrALAZINE 20-37.5 MG tablet  Commonly known as:  BIDIL  Take 1 tablet by mouth 3 (three) times daily.     rosuvastatin 20 MG tablet  Commonly known as:  CRESTOR  Take 1 tablet (20  mg total) by mouth daily.     sacubitril-valsartan 24-26 MG  Commonly known as:  ENTRESTO  Take 1 tablet by mouth 2 (two) times daily.     spironolactone 25 MG tablet  Commonly known as:  ALDACTONE  Take 0.5 tablets (12.5 mg total) by mouth daily.     traMADol 50 MG tablet  Commonly known as:  ULTRAM  Take 50 mg by mouth every 8 (eight) hours as needed for moderate pain.        Disposition:  Discharge Instructions    Diet - low sodium heart healthy    Complete by:  As directed      Increase activity slowly    Complete by:  As directed           Follow-up Information    Follow up with [facility] On 07/24/2015.   Why:  at 9:30AM for wound check   Contact information:   [address] [place] [address]       Follow up with Virl Axe, MD On 10/09/2015.   Specialty:  Cardiology   Why:  at [place] information:   [place] [address] [phone]       Duration of Discharge Encounter: Greater than 30 minutes including physician time.  Signed, Chanetta Kray, NP 07/10/2015 7:36 AM     Seen and examined insturctions given

## 2015-07-24 ENCOUNTER — Ambulatory Visit (INDEPENDENT_AMBULATORY_CARE_PROVIDER_SITE_OTHER): Payer: 59 | Admitting: *Deleted

## 2015-07-24 DIAGNOSIS — I499 Cardiac arrhythmia, unspecified: Secondary | ICD-10-CM | POA: Diagnosis not present

## 2015-07-24 DIAGNOSIS — I429 Cardiomyopathy, unspecified: Secondary | ICD-10-CM

## 2015-07-24 DIAGNOSIS — I428 Other cardiomyopathies: Secondary | ICD-10-CM

## 2015-07-24 DIAGNOSIS — I5022 Chronic systolic (congestive) heart failure: Secondary | ICD-10-CM

## 2015-07-24 LAB — CUP PACEART INCLINIC DEVICE CHECK
Brady Statistic RV Percent Paced: 0.06 %
HIGH POWER IMPEDANCE MEASURED VALUE: 65 Ohm
HighPow Impedance: 70.875
Implantable Lead Implant Date: 20161012
Lead Channel Impedance Value: 587.5 Ohm
Lead Channel Pacing Threshold Amplitude: 0.5 V
Lead Channel Pacing Threshold Pulse Width: 0.5 ms
Lead Channel Setting Pacing Amplitude: 3.5 V
MDC IDC LEAD LOCATION: 753860
MDC IDC LEAD MODEL: 7122
MDC IDC MSMT BATTERY REMAINING LONGEVITY: 103.2
MDC IDC MSMT LEADCHNL RV PACING THRESHOLD AMPLITUDE: 0.5 V
MDC IDC MSMT LEADCHNL RV PACING THRESHOLD PULSEWIDTH: 0.5 ms
MDC IDC MSMT LEADCHNL RV SENSING INTR AMPL: 12 mV
MDC IDC SESS DTM: 20161027102938
MDC IDC SET LEADCHNL RV PACING PULSEWIDTH: 0.5 ms
MDC IDC SET LEADCHNL RV SENSING SENSITIVITY: 0.5 mV
Pulse Gen Serial Number: 7308640

## 2015-07-24 NOTE — Progress Notes (Signed)
Wound check appointment. Wound without redness or edema. Incision edges approximated, wound well healed. Normal device function. Threshold, sensing, and impedances consistent with implant measurements. Device programmed at 3.5V for extra safety margin until 3 month visit. Histogram distribution appropriate for patient and level of activity. No ventricular arrhythmias noted. Patient educated about wound care, arm mobility, lifting restrictions, shock plan. ROV w/ SK 10/09/15.

## 2015-08-29 ENCOUNTER — Encounter: Payer: Self-pay | Admitting: Internal Medicine

## 2015-08-29 ENCOUNTER — Ambulatory Visit (HOSPITAL_COMMUNITY)
Admission: RE | Admit: 2015-08-29 | Discharge: 2015-08-29 | Disposition: A | Discharge status: Home / Self Care | Payer: 59 | Source: Ambulatory Visit | Attending: Cardiology | Admitting: Cardiology

## 2015-08-29 ENCOUNTER — Encounter (HOSPITAL_COMMUNITY): Payer: Self-pay | Admitting: Cardiology

## 2015-08-29 VITALS — BP 122/72 | HR 79 | Ht 73.0 in | Wt 161.4 lb

## 2015-08-29 DIAGNOSIS — Z720 Tobacco use: Secondary | ICD-10-CM | POA: Insufficient documentation

## 2015-08-29 DIAGNOSIS — I428 Other cardiomyopathies: Secondary | ICD-10-CM

## 2015-08-29 DIAGNOSIS — M545 Low back pain: Secondary | ICD-10-CM | POA: Insufficient documentation

## 2015-08-29 DIAGNOSIS — F1010 Alcohol abuse, uncomplicated: Secondary | ICD-10-CM | POA: Diagnosis not present

## 2015-08-29 DIAGNOSIS — Z7982 Long term (current) use of aspirin: Secondary | ICD-10-CM | POA: Diagnosis not present

## 2015-08-29 DIAGNOSIS — F1490 Cocaine use, unspecified, uncomplicated: Secondary | ICD-10-CM | POA: Insufficient documentation

## 2015-08-29 DIAGNOSIS — I739 Peripheral vascular disease, unspecified: Secondary | ICD-10-CM | POA: Diagnosis not present

## 2015-08-29 DIAGNOSIS — I10 Essential (primary) hypertension: Secondary | ICD-10-CM | POA: Diagnosis not present

## 2015-08-29 DIAGNOSIS — I429 Cardiomyopathy, unspecified: Secondary | ICD-10-CM | POA: Diagnosis not present

## 2015-08-29 DIAGNOSIS — Z8249 Family history of ischemic heart disease and other diseases of the circulatory system: Secondary | ICD-10-CM | POA: Insufficient documentation

## 2015-08-29 DIAGNOSIS — Z79899 Other long term (current) drug therapy: Secondary | ICD-10-CM | POA: Diagnosis not present

## 2015-08-29 DIAGNOSIS — I5022 Chronic systolic (congestive) heart failure: Secondary | ICD-10-CM | POA: Diagnosis not present

## 2015-08-29 DIAGNOSIS — Z87891 Personal history of nicotine dependence: Secondary | ICD-10-CM | POA: Insufficient documentation

## 2015-08-29 DIAGNOSIS — E785 Hyperlipidemia, unspecified: Secondary | ICD-10-CM | POA: Diagnosis not present

## 2015-08-29 DIAGNOSIS — I2510 Atherosclerotic heart disease of native coronary artery without angina pectoris: Secondary | ICD-10-CM | POA: Diagnosis not present

## 2015-08-29 DIAGNOSIS — F17210 Nicotine dependence, cigarettes, uncomplicated: Secondary | ICD-10-CM

## 2015-08-29 LAB — BASIC METABOLIC PANEL
Anion gap: 8 (ref 5–15)
BUN: 17 mg/dL (ref 6–20)
CHLORIDE: 106 mmol/L (ref 101–111)
CO2: 25 mmol/L (ref 22–32)
Calcium: 9.9 mg/dL (ref 8.9–10.3)
Creatinine, Ser: 1.03 mg/dL (ref 0.61–1.24)
GFR calc non Af Amer: >60 mL/min (ref >60)
Glucose, Bld: 103 mg/dL — ABNORMAL HIGH (ref 65–99)
POTASSIUM: 4.2 mmol/L (ref 3.5–5.1)
SODIUM: 139 mmol/L (ref 135–145)

## 2015-08-29 LAB — BRAIN NATRIURETIC PEPTIDE: B NATRIURETIC PEPTIDE 5: 68.8 pg/mL (ref 0.0–100.0)

## 2015-08-29 LAB — DIGOXIN LEVEL

## 2015-08-29 MED ORDER — SPIRONOLACTONE 25 MG PO TABS: 25.0000 mg | ORAL_TABLET | Freq: Every day | ORAL | Status: DC

## 2015-08-29 MED ORDER — CARVEDILOL 6.25 MG PO TABS: 9.3750 mg | ORAL_TABLET | Freq: Two times a day (BID) | ORAL | Status: DC

## 2015-08-29 NOTE — Patient Instructions (Signed)
INCREASE Coreg to 9.375 mg, 1 and 1/2 tab twice a day INCREASE Spironolactone to 25 mg, one tab daily  Labs today and again in 10-14 days  Your physician recommends that you schedule a follow-up appointment in: 6 weeks  Do the following things EVERYDAY: 1) Weigh yourself in the morning before breakfast. Write it down and keep it in a log. 2) Take your medicines as prescribed 3) Eat low salt foods-Limit salt (sodium) to 2000 mg per day.  4) Stay as active as you can everyday 5) Limit all fluids for the day to less than 2 liters 6)

## 2015-08-31 NOTE — Progress Notes (Signed)
Patient ID: [name] [name], male   DOB: 1956-01-20, 59 y.o.   MRN: [id number] PCP: Dr [name] [name] Primary Cardiologist: Dr [name] [name] PARAMEDICINE Patient.   HPI: [name] is a 59 year old with history of ETOH was found to have EF 10% by echo. He had exertional dyspnea and chest pain, taken for LHC/RHC 5/10 showing nonobstructive CAD, elevated filling pressures (but not marked elevation), and low cardiac index. Unfortunately he is not a candidate for advanced therapies/home inotropes due to ETOH abuse. He was admitted to optimize HF. He was diuresed with IV lasix and transitioned to po lasix. Overall he diuresed 9 pounds. He continued on low dose carvedilol, lisinopril, bidil, and spironolactone.   [name] returns for follow up. Overall feeling ok.  Denies PND/Orthopnea. No ETOH but continues to smoke 1 ppd.  He has bilateral calf pain after walking about 1.5 blocks.  No chest pain.  No significant dyspnea with walking on flat ground.   Labs (5/16): K 3.8, creatinine 0.94 => 1.19, HCT 48.4, LDL 121, HDL 50, digoxin 0.3, BNP 2343 => 367 Labs (6/16): K 4, creatinine 1.06 Labs (04/18/15): K 4.3 Creatinine 0.91 Labs (05/07/2015): K 4.5 Creatinine 0.88  Labs (10/16): K 4.6, creatinine 0.95, digoxin 0.5, TC 163   PMH: 1. Low back pain 2. HTN 3. Hyperlipidemia 4. H/o cocaine abuse: Not active. 5. ETOH abuse: 1 pint/day 6. CAD: Coronary CT angiogram in 9/09 showed possible significant obstructive disease in the circumflex. He did not have cardiac cath.  7. Cardiomyopathy: Echo (9/09) with EF 35-40%. Echo (5/16) with EF 10%, mild LV dilation, grade II diastolic dysfunction, mildly dilated RV with moderately decreased systolic function. LHC/RHC (5/16): Nonobstructive coronary disease, mean RA 10, PA 49/28, mean PCWP 25, CI 1.4.  Echo (9/16) with EF 20-25%, severe LV dilation. St Jude ICD.  8. Active smoker 9. PAD: ABI L normal, R mildly decreased.   SH: Lives in [place], works as [name] [name],  lives with son, smokes 1 ppd, drank 1 pint/day liquor => quit 5/16, prior cocaine use.   FH: Brother with MI in his 65s, another brother with ?SCD, mother with CVA.   ROS: All systems reviewed and negative except as per HPI.   Current Outpatient Prescriptions  Medication Sig Dispense Refill  . aspirin EC 81 MG EC tablet Take 1 tablet (81 mg total) by mouth daily. 30 tablet 6  . carvedilol (COREG) 6.25 MG tablet Take 1.5 tablets (9.375 mg total) by mouth 2 (two) times daily with a meal. 90 tablet 3  . digoxin (LANOXIN) 0.125 MG tablet Take 1 tablet (0.125 mg total) by mouth daily. 30 tablet 6  . furosemide (LASIX) 20 MG tablet Take 1 tablet (20 mg total) by mouth daily as needed for fluid or edema. 30 tablet 3  . isosorbide-hydrALAZINE (BIDIL) 20-37.5 MG per tablet Take 1 tablet by mouth 3 (three) times daily. 90 tablet 6  . rosuvastatin (CRESTOR) 20 MG tablet Take 1 tablet (20 mg total) by mouth daily. 90 tablet 3  . sacubitril-valsartan (ENTRESTO) 24-26 MG Take 1 tablet by mouth 2 (two) times daily. 60 tablet 3  . spironolactone (ALDACTONE) 25 MG tablet Take 1 tablet (25 mg total) by mouth daily. 30 tablet 3  . traMADol (ULTRAM) 50 MG tablet Take 1 tablet (50 mg total) by mouth every 8 (eight) hours as needed for moderate pain. 10 tablet 0   No current facility-administered medications for this encounter.    Filed Vitals:   08/29/15 1449  BP:  122/72  Pulse: 79  Height: 6\' 1"  (1.854 m)  Weight: 161 lb 6.4 oz (73.211 kg)  SpO2: 98%    PHYSICAL EXAM: General:  Thin appearing. No resp difficulty. Ambulated slowly into the clinic.  HEENT: normal Neck: supple. JVP flat. Carotids 2+ bilaterally; no bruits. No lymphadenopathy or thryomegaly appreciated.  Cor: PMI laterally displaced. Regular rate & rhythm. No rubs, gallops or murmurs.  Unable to palpate pedal pulses.  Lungs: clear Abdomen: soft, nontender, nondistended. No hepatosplenomegaly. No bruits or masses. Good bowel  sounds. Extremities: no cyanosis, clubbing, rash, edema Neuro: alert & orientedx3, cranial nerves grossly intact. Moves all 4 extremities w/o difficulty. Affect pleasant.  ASSESSMENT & PLAN: 1. Chronic Systolic HF: Nonischemic cardiomyopathy with low output per RHC/LHC in 5/16.  Echo (9/16) with EF 20-25%, severe LV dilation.  Possible ETOH cardiomyopathy. NYHA class II symptoms, seems to be doing better overall.  No volume overload on exam. Limited literacy.  Has St Jude ICD.  - He does not appear to need Lasix. - Increase Coreg to 9.375 mg bid.  - Increase spironolactone to 25 mg daily.  BMET/BNP today and BMET in 10 days.  - Continue  Bidil 1 tab tid - Continue entresto 24-26 mg twice a day.  - Continue digoxin 0.125 daily, check digoxin level.   - CPX after next appointment. 2. ETOH Abuse: Former heavy drinker, he has quit.   3. Current Smoker: Continues smokes 1PPD. Encouraged to stop smoking.  4. PAD: Mild claudication.  ABIs not markedly abnormal.  Encouraged him to quit smoking.  Continue ASA 81 and Crestor 20 daily with vascular disease.  Needs to try to walk through leg pain for at least a short distance.  5. Hyperlipidemia: Goal LDL < 70 with PAD, on statin.    5. CAD: Nonobstructive. No evidence of ischemia.   Follow up 6 weeks.   [name] [name] 08/31/2015

## 2015-09-04 ENCOUNTER — Other Ambulatory Visit (HOSPITAL_COMMUNITY): Payer: Self-pay | Admitting: Adult Health

## 2015-09-12 ENCOUNTER — Ambulatory Visit (HOSPITAL_COMMUNITY)
Admission: RE | Admit: 2015-09-12 | Discharge: 2015-09-12 | Disposition: A | Discharge status: Home / Self Care | Payer: 59 | Source: Ambulatory Visit | Attending: Internal Medicine | Admitting: Internal Medicine

## 2015-09-12 DIAGNOSIS — I5022 Chronic systolic (congestive) heart failure: Secondary | ICD-10-CM | POA: Insufficient documentation

## 2015-09-12 LAB — BASIC METABOLIC PANEL WITH GFR
Anion gap: 10 (ref 5–15)
BUN: 14 mg/dL (ref 6–20)
CO2: 24 mmol/L (ref 22–32)
Calcium: 9.9 mg/dL (ref 8.9–10.3)
Chloride: 105 mmol/L (ref 101–111)
Creatinine, Ser: 0.9 mg/dL (ref 0.61–1.24)
GFR calc Af Amer: >60 mL/min
GFR calc non Af Amer: >60 mL/min
Glucose, Bld: 107 mg/dL — ABNORMAL HIGH (ref 65–99)
Potassium: 4.2 mmol/L (ref 3.5–5.1)
Sodium: 139 mmol/L (ref 135–145)

## 2015-10-05 ENCOUNTER — Other Ambulatory Visit (HOSPITAL_COMMUNITY): Payer: Self-pay | Admitting: Adult Health

## 2015-10-09 ENCOUNTER — Encounter: Payer: Self-pay | Admitting: Internal Medicine

## 2015-10-09 ENCOUNTER — Telehealth: Payer: Self-pay

## 2015-10-09 ENCOUNTER — Ambulatory Visit (INDEPENDENT_AMBULATORY_CARE_PROVIDER_SITE_OTHER): Payer: BLUE CROSS/BLUE SHIELD | Admitting: Internal Medicine

## 2015-10-09 VITALS — BP 100/60 | HR 65 | Ht 73.0 in | Wt 162.6 lb

## 2015-10-09 DIAGNOSIS — Z95810 Presence of automatic (implantable) cardiac defibrillator: Secondary | ICD-10-CM

## 2015-10-09 DIAGNOSIS — I428 Other cardiomyopathies: Secondary | ICD-10-CM

## 2015-10-09 DIAGNOSIS — I5022 Chronic systolic (congestive) heart failure: Secondary | ICD-10-CM | POA: Diagnosis not present

## 2015-10-09 DIAGNOSIS — I429 Cardiomyopathy, unspecified: Secondary | ICD-10-CM | POA: Diagnosis not present

## 2015-10-09 NOTE — Patient Instructions (Signed)
Medication Instructions: - no changes  Labwork: - none  Procedures/Testing: - none  Follow-Up: - Your physician recommends that you schedule a follow-up appointment in: 3 weeks with Chanetta Sherod, NP for Dr. Caryl Comes.  - Your physician wants you to follow-up in: 9 months with Dr. Caryl Comes. You will receive a reminder letter in the mail two months in advance. If you don't receive a letter, please call our office to schedule the follow-up appointment.   Any Additional Special Instructions Will Be Listed Below (If Applicable).

## 2015-10-09 NOTE — Progress Notes (Signed)
Patient Care Team: Tresa Garter, MD as PCP - General (Internal Medicine)   HPI  [name] [name] is a 60 y.o. male Seen in followup for ICD implanted for NICM for primary prevention   The patient denies chest pain, shortness of breath, nocturnal dyspnea, orthopnea or peripheral edema.  There have been no palpitations, lightheadedness or syncope.   Pain in L shoulder  Seen in CHF clinic reviewed stable  Labs ok  Past Medical History  Diagnosis Date  . Hypertension   . Coronary artery disease   . Nonischemic cardiomyopathy ([facility]) 06/30/2015  . Chronic systolic CHF (congestive heart failure) ([place]) 01/31/2015  . AICD (automatic cardioverter/defibrillator) present     Past Surgical History  Procedure Laterality Date  . Chalazion excision Left 01/16/2014    Procedure: MINOR EXCISION OF CHALAZION;  Surgeon: Myrtha Mantis., MD;  Location: [facility];  Service: Ophthalmology;  Laterality: Left;  . Cardiac catheterization N/A 02/04/2015    Procedure: Right/Left Heart Cath and Coronary Angiography;  Surgeon: Larey Dresser, MD;  Location: [facility];  Service: Cardiovascular;  Laterality: N/A;  . Cardiac defibrillator placement  07/09/2015  . Eye surgery Left ~ 2013    "eye infection"  . Ep implantable device N/A 07/09/2015    Procedure: ICD Implant;  Surgeon: Deboraha Sprang, MD;  Location: [facility];  Service: Cardiovascular;  Laterality: N/A;    Current Outpatient Prescriptions  Medication Sig Dispense Refill  . aspirin 81 MG EC tablet TAKE ONE TABLET BY MOUTH ONCE DAILY 30 tablet 0  . BIDIL 20-37.5 MG tablet TAKE ONE TABLET BY MOUTH THREE TIMES DAILY 90 tablet 0  . carvedilol (COREG) 6.25 MG tablet Take 1.5 tablets (9.375 mg total) by mouth 2 (two) times daily with a meal. 90 tablet 3  . digoxin (LANOXIN) 0.125 MG tablet TAKE ONE TABLET BY MOUTH ONCE DAILY 30 tablet 0  . furosemide (LASIX) 20 MG tablet Take 1 tablet (20 mg total)  by mouth daily as needed for fluid or edema. 30 tablet 3  . rosuvastatin (CRESTOR) 20 MG tablet Take 1 tablet (20 mg total) by mouth daily. 90 tablet 3  . sacubitril-valsartan (ENTRESTO) 24-26 MG Take 1 tablet by mouth 2 (two) times daily. 60 tablet 3  . spironolactone (ALDACTONE) 25 MG tablet Take 1 tablet (25 mg total) by mouth daily. 30 tablet 3  . traMADol (ULTRAM) 50 MG tablet Take 1 tablet (50 mg total) by mouth every 8 (eight) hours as needed for moderate pain. 10 tablet 0   No current facility-administered medications for this visit.    No Known Allergies    Review of Systems negative except from HPI and PMH  Physical Exam BP 100/60 mmHg  Pulse 65  Ht 6\' 1"  (1.854 m)  Wt 162 lb 9.6 oz (73.755 kg)  BMI 21.46 kg/m2 Well developed and well nourished in no acute distress HENT normal E scleral and icterus clear Neck Supple JVP flat; carotids brisk and full Clear to ausculation Device pocket well healed; without hematoma or erythema.  There is no tethering  *Regular rate and rhythm, no murmurs gallops or rub Soft with active bowel sounds No clubbing cyanosis  Edema restericted motion of the L armAlert and oriented, grossly normal motor and sensory function Skin Warm and Dry  ECG demonstrates sinus rhythm at 65 Intervals 19/10/41 excellent axis left -38 ST-T changes  Assessment and  Plan  Nonischemic cardiomyopathy  Implantable  defibrillator-St. Jude  Congestive heart failure-chronic-systolic  Adhesive capsulitis   The patient has developed adhesive capsulitis following device implant. He recalls the instructions of motion. He has been progressively restricting of his left arm motion because of discomfort. We have reviewed basic extension therapy i.e. crawling upf the wall. We will check him again in about 3 weeks time.  Device function was reprogrammed from maximum longevity.  Euvolemic continue current meds

## 2015-10-09 NOTE — Telephone Encounter (Signed)
Prior auth for [employer] 24-26 sent to [facility].

## 2015-10-09 NOTE — Telephone Encounter (Signed)
Duplicate encounter

## 2015-10-10 ENCOUNTER — Telehealth: Payer: Self-pay

## 2015-10-10 NOTE — Telephone Encounter (Signed)
Entresto approved per [employer]

## 2015-10-14 ENCOUNTER — Encounter (HOSPITAL_COMMUNITY): Payer: 59

## 2015-10-20 NOTE — Progress Notes (Signed)
Patient ID: [name] [name], male   DOB: 1956/02/25, 60 y.o.   MRN: [id number]    Advanced Heart Failure Clinic Note   PCP: Dr Doreene Burke Primary Cardiologist: Dr Aundra Dubin PARAMEDICINE Patient.   HPI: [name] [name] is a 60 year old with history of ETOH was found to have EF 10% by echo. He had exertional dyspnea and chest pain, taken for LHC/RHC 5/10 showing nonobstructive CAD, elevated filling pressures (but not marked elevation), and low cardiac index. Unfortunately he is not a candidate for advanced therapies/home inotropes due to ETOH abuse. He was admitted to optimize HF. He was diuresed with IV lasix and transitioned to po lasix. Overall he diuresed 9 pounds. He continued on low dose carvedilol, lisinopril, bidil, and spironolactone.   [name] [name] returns today for regular follow up. Feeling good overall. Denies swelling or SOB. Has been out of Bidil for about a week. Weight down 3 lbs from last visit. Continues to smoke 1 ppd. Still avoiding alcohol. Denies PND/Orthopnea. Calf pain improved. Still after walking a block or two. No chest pain.  No DOE on flat ground. Doesn't have to stop walking around the grocery store. Has adhesive shoulder from ICD placement. Given extension exercises by EP. ( crawl your hand up the wall). No dizziness or lightheadedness.   EKG: 52 bpm, sinus brady with PACs.  Labs (5/16): K 3.8, creatinine 0.94 => 1.19, HCT 48.4, LDL 121, HDL 50, digoxin 0.3, BNP 2343 => 367 Labs (6/16): K 4, creatinine 1.06 Labs (04/18/15): K 4.3 Creatinine 0.91 Labs (05/07/2015): K 4.5 Creatinine 0.88  Labs (10/16): K 4.6, creatinine 0.95, digoxin 0.5, TC 163 Labs (12/16) K 4.2, creatinine 0.90  PMH: 1. Low back pain 2. HTN 3. Hyperlipidemia 4. H/o cocaine abuse: Not active. 5. ETOH abuse: 1 pint/day 6. CAD: Coronary CT angiogram in 9/09 showed possible significant obstructive disease in the circumflex. He did not have cardiac cath.  7. Cardiomyopathy: Echo (9/09) with EF 35-40%.  Echo (5/16) with EF 10%, mild LV dilation, grade II diastolic dysfunction, mildly dilated RV with moderately decreased systolic function. LHC/RHC (5/16): Nonobstructive coronary disease, mean RA 10, PA 49/28, mean PCWP 25, CI 1.4.  Echo (9/16) with EF 20-25%, severe LV dilation. St Jude ICD.  8. Active smoker 9. PAD: ABI L normal, R mildly decreased.   SH: Lives in [place], works as Dealer, lives with son, smokes 1 ppd, drank 1 pint/day liquor => quit 5/16, prior cocaine use.   FH: Brother with MI in his 1s, another brother with ?SCD, mother with CVA.   ROS: All systems reviewed and negative except as per HPI.   Current Outpatient Prescriptions  Medication Sig Dispense Refill  . aspirin 81 MG EC tablet TAKE ONE TABLET BY MOUTH ONCE DAILY 30 tablet 0  . BIDIL 20-37.5 MG tablet TAKE ONE TABLET BY MOUTH THREE TIMES DAILY 90 tablet 0  . carvedilol (COREG) 6.25 MG tablet Take 1.5 tablets (9.375 mg total) by mouth 2 (two) times daily with a meal. 90 tablet 3  . digoxin (LANOXIN) 0.125 MG tablet TAKE ONE TABLET BY MOUTH ONCE DAILY 30 tablet 0  . furosemide (LASIX) 20 MG tablet Take 1 tablet (20 mg total) by mouth daily as needed for fluid or edema. 30 tablet 3  . rosuvastatin (CRESTOR) 20 MG tablet Take 1 tablet (20 mg total) by mouth daily. 90 tablet 3  . sacubitril-valsartan (ENTRESTO) 24-26 MG Take 1 tablet by mouth 2 (two) times daily. 60 tablet 3  . spironolactone (ALDACTONE)  25 MG tablet Take 1 tablet (25 mg total) by mouth daily. 30 tablet 3  . traMADol (ULTRAM) 50 MG tablet Take 1 tablet (50 mg total) by mouth every 8 (eight) hours as needed for moderate pain. 10 tablet 0   No current facility-administered medications for this encounter.    Filed Vitals:   10/21/15 1117  BP: 126/66  Pulse: 60  Weight: 158 lb 6.4 oz (71.85 kg)  SpO2: 100%    Wt Readings from Last 3 Encounters:  10/21/15 158 lb 6.4 oz (71.85 kg)  10/09/15 162 lb 9.6 oz (73.755 kg)  08/29/15 161 lb 6.4 oz  (73.211 kg)     PHYSICAL EXAM: General:  Thin appearing. No resp difficulty. Ambulated into clinic without difficulty HEENT: normal Neck: supple. JVP 6-7. Carotids 2+ bilaterally; no bruits. No thyromegaly or nodule noted. Cor: PMI laterally displaced. RRR, No M/G/R Unable to palpate pedal pulses.  Lungs: CTAB, normal effort Abdomen: soft, NT, ND, no HSM. No bruits or masses. +BS  Extremities: no cyanosis, clubbing, rash, edema Neuro: alert & orientedx3, cranial nerves grossly intact. Moves all 4 extremities w/o difficulty. Affect pleasant.  ASSESSMENT & PLAN: 1. Chronic Systolic HF: Nonischemic cardiomyopathy with low output per RHC/LHC in 5/16.  Echo (9/16) with EF 20-25%, severe LV dilation s/p St Jude ICD.  Possible ETOH cardiomyopathy. NYHA class II symptoms, seems to be doing better overall.  Limited literacy - Volume status stable on exam.  - Has not needed lasix.  Continue to use only as needed for weight gains of 3 lbs over night or 5 lbs in a weeks time.  - Continue Coreg 9.375 mg BID. Will not increase for now with bradycardia. EKG today shows sinus brady with PACs - Continue Spironolactone 25 mg daily.  BMET today.  - Continue Bidil 1 tab TID. Has been off for a week.  Will see back in 2 weeks with Pharm-D for med titration.  - Continue Entresto 24-26 mg BID  - Continue Digoxin 0.125 daily, level <0.2 in 08/2015 - Will schedule for CPX 2. ETOH Abuse: Former heavy drinker, he has quit.   3. Current Smoker:  - Continues to smoke 1ppd. Re-educated on this being one of the primary contributing factor to his PAD, and encouraged cessation. 4. PAD: Mild claudication.  ABIs not markedly abnormal 01/2015.   - Continued to encourage him to quit smoking.   - Continue ASA 81 and Crestor 20 daily.  - Encouraged to increase activity as able.   5. Hyperlipidemia: Goal LDL < 70 with PAD - Continue Crestor 20 mg daily.    - Can check at next MD visit.  5. CAD: Nonobstructive. No evidence  of ischemia.   Check BMET today. Follow up with Abbe Amsterdam D in 2 weeks for med titration then Dr. Aundra Dubin x 2 months from today. Will schedule for CPX. Last echo 9/16. Nearing goal dose medicines. Consider repeat in 3-4 months on optimized medical therapy.  Historical documentation reviewed to assist in the decision making process of this visit includes: Previous visits, labwork, diagnostic tests, and visits with other specialties (namely EP)  Shirley Friar PA-C 10/21/2015

## 2015-10-21 ENCOUNTER — Ambulatory Visit (HOSPITAL_COMMUNITY)
Admission: RE | Admit: 2015-10-21 | Discharge: 2015-10-21 | Disposition: A | Discharge status: Home / Self Care | Payer: BLUE CROSS/BLUE SHIELD | Source: Ambulatory Visit | Attending: Internal Medicine | Admitting: Internal Medicine

## 2015-10-21 VITALS — BP 126/66 | HR 60 | Wt 158.4 lb

## 2015-10-21 DIAGNOSIS — F1010 Alcohol abuse, uncomplicated: Secondary | ICD-10-CM | POA: Diagnosis not present

## 2015-10-21 DIAGNOSIS — I739 Peripheral vascular disease, unspecified: Secondary | ICD-10-CM

## 2015-10-21 DIAGNOSIS — Z8249 Family history of ischemic heart disease and other diseases of the circulatory system: Secondary | ICD-10-CM | POA: Insufficient documentation

## 2015-10-21 DIAGNOSIS — I110 Hypertensive heart disease with heart failure: Secondary | ICD-10-CM | POA: Insufficient documentation

## 2015-10-21 DIAGNOSIS — I5022 Chronic systolic (congestive) heart failure: Secondary | ICD-10-CM

## 2015-10-21 DIAGNOSIS — I10 Essential (primary) hypertension: Secondary | ICD-10-CM | POA: Diagnosis not present

## 2015-10-21 DIAGNOSIS — Z79899 Other long term (current) drug therapy: Secondary | ICD-10-CM | POA: Diagnosis not present

## 2015-10-21 DIAGNOSIS — Z7982 Long term (current) use of aspirin: Secondary | ICD-10-CM | POA: Insufficient documentation

## 2015-10-21 DIAGNOSIS — Z95810 Presence of automatic (implantable) cardiac defibrillator: Secondary | ICD-10-CM | POA: Insufficient documentation

## 2015-10-21 DIAGNOSIS — Z823 Family history of stroke: Secondary | ICD-10-CM | POA: Insufficient documentation

## 2015-10-21 DIAGNOSIS — I2510 Atherosclerotic heart disease of native coronary artery without angina pectoris: Secondary | ICD-10-CM | POA: Diagnosis not present

## 2015-10-21 DIAGNOSIS — E785 Hyperlipidemia, unspecified: Secondary | ICD-10-CM | POA: Insufficient documentation

## 2015-10-21 DIAGNOSIS — F17210 Nicotine dependence, cigarettes, uncomplicated: Secondary | ICD-10-CM | POA: Diagnosis not present

## 2015-10-21 DIAGNOSIS — I428 Other cardiomyopathies: Secondary | ICD-10-CM | POA: Insufficient documentation

## 2015-10-21 LAB — BASIC METABOLIC PANEL
ANION GAP: 8 (ref 5–15)
BUN: 10 mg/dL (ref 6–20)
CALCIUM: 9.7 mg/dL (ref 8.9–10.3)
CO2: 27 mmol/L (ref 22–32)
Chloride: 106 mmol/L (ref 101–111)
Creatinine, Ser: 0.91 mg/dL (ref 0.61–1.24)
GFR calc Af Amer: >60 mL/min (ref >60)
Glucose, Bld: 110 mg/dL — ABNORMAL HIGH (ref 65–99)
POTASSIUM: 4.1 mmol/L (ref 3.5–5.1)
SODIUM: 141 mmol/L (ref 135–145)

## 2015-10-21 MED ORDER — ISOSORB DINITRATE-HYDRALAZINE 20-37.5 MG PO TABS: 1.0000 | ORAL_TABLET | Freq: Three times a day (TID) | ORAL | Status: DC

## 2015-10-21 NOTE — Progress Notes (Signed)
Advanced Heart Failure Medication Review by a Pharmacist  Does the patient  feel that his/her medications are working for him/her?  yes  Has the patient been experiencing any side effects to the medications prescribed?  no  Does the patient measure his/her own blood pressure or blood glucose at home?  no   Does the patient have any problems obtaining medications due to transportation or finances?   no  Understanding of regimen: good Understanding of indications: good Potential of compliance: fair Patient understands to avoid NSAIDs. Patient understands to avoid decongestants.  Issues to address at subsequent visits: None   Pharmacist comments:  [name] [name] is a pleasant 60 yo M presenting with his medication bottles. He reports good compliance with his regimen but states that he ran out of refills on his Bidil about a week ago. We will send in a new Rx today.  Ruta Hinds. Velva Harman, PharmD, BCPS, CPP Clinical Pharmacist Pager: [phone] Phone: [phone] 10/21/2015 11:28 AM      Time with patient: 8 minutes Preparation and documentation time: 4 minutes Total time: 12 minutes

## 2015-10-21 NOTE — Patient Instructions (Signed)
Labs today  Your physician has recommended that you have a cardiopulmonary stress test (CPX). CPX testing is a non-invasive measurement of heart and lung function. It replaces a traditional treadmill stress test. This type of test provides a tremendous amount of information that relates not only to your present condition but also for future outcomes. This test combines measurements of you ventilation, respiratory gas exchange in the lungs, electrocardiogram (EKG), blood pressure and physical response before, during, and following an exercise protocol.  Your physician recommends that you schedule a follow-up appointment in: 2 weeks with Erika,PharmD for medication management  Your physician recommends that you schedule a follow-up appointment in: 2 months with Dr.McLean

## 2015-10-23 ENCOUNTER — Encounter (HOSPITAL_COMMUNITY): Payer: Self-pay | Admitting: Pharmacist

## 2015-10-24 ENCOUNTER — Encounter: Payer: Self-pay | Admitting: Internal Medicine

## 2015-10-24 ENCOUNTER — Telehealth (HOSPITAL_COMMUNITY): Payer: Self-pay | Admitting: Cardiology

## 2015-10-24 DIAGNOSIS — I5022 Chronic systolic (congestive) heart failure: Secondary | ICD-10-CM

## 2015-10-24 MED ORDER — ISOSORBIDE MONONITRATE ER 60 MG PO TB24: 60.0000 mg | ORAL_TABLET | Freq: Every day | ORAL | Status: DC

## 2015-10-24 MED ORDER — HYDRALAZINE HCL 25 MG PO TABS: 37.5000 mg | ORAL_TABLET | Freq: Three times a day (TID) | ORAL | Status: DC

## 2015-10-24 NOTE — Telephone Encounter (Signed)
PATIENT CALLED TO REQUEST SAMPLES OF BIDIL HF CLINIC DOES NOT HAV SAMPLES AT THIS TIME  PER DR MCLEAN OK TO SPLIT MED UNTIL APPEAL IS COMPLETE HYDRALAZINE 37.5 TID IMDUR 60 DAILY

## 2015-10-25 LAB — CUP PACEART INCLINIC DEVICE CHECK
Date Time Interrogation Session: 20170128121635
Implantable Lead Implant Date: 20161012
Implantable Lead Location: 753860
Lead Channel Pacing Threshold Pulse Width: 0.5 ms
Lead Channel Setting Pacing Amplitude: 3.5 V
Lead Channel Setting Pacing Pulse Width: 0.5 ms
Lead Channel Setting Sensing Sensitivity: 0.5 mV
MDC IDC LEAD MODEL: 7122
MDC IDC MSMT LEADCHNL RV PACING THRESHOLD AMPLITUDE: 0.75 V
MDC IDC MSMT LEADCHNL RV SENSING INTR AMPL: 11.8 mV
MDC IDC STAT BRADY RV PERCENT PACED: 1 % — AB
Pulse Gen Serial Number: 7308640

## 2015-10-28 NOTE — Progress Notes (Signed)
Electrophysiology Office Note Date: 10/29/2015  ID:  Rhyett Peete, DOB 26-Aug-1956, MRN [id number]  PCP: Angelica Chessman, MD Primary Cardiologist: Aundra Dubin Electrophysiologist: Caryl Comes  CC:  follow-up for shoulder discomfort  [name] [name] is a 60 y.o. male seen today for Dr Caryl Comes.  He presents today for routine electrophysiology followup. He underwent ICD implant 06/2015 and developed adhesive capsulitis. When seen by Dr Caryl Comes at last office visit, exercises were demonstrated. Since last being seen in our clinic, the patient reports doing reasonably well. He has persistent decreased range of motion of left arm. He denies chest pain, palpitations, dyspnea, PND, orthopnea, nausea, vomiting, dizziness, syncope, edema, weight gain, or early satiety.  He has not had ICD shocks.   Device History: SJT single chamber ICD implanted 2016 for NICM History of appropriate therapy: No History of AAD therapy: No   Past Medical History  Diagnosis Date  . Hypertension   . Coronary artery disease   . Nonischemic cardiomyopathy ([place]) 06/30/2015  . Chronic systolic CHF (congestive heart failure) ([place]) 01/31/2015  . AICD (automatic cardioverter/defibrillator) present    Past Surgical History  Procedure Laterality Date  . Chalazion excision Left 01/16/2014    Procedure: MINOR EXCISION OF CHALAZION;  Surgeon: Myrtha Mantis., MD;  Location: [facility];  Service: Ophthalmology;  Laterality: Left;  . Cardiac catheterization N/A 02/04/2015    Procedure: Right/Left Heart Cath and Coronary Angiography;  Surgeon: Larey Dresser, MD;  Location: [facility];  Service: Cardiovascular;  Laterality: N/A;  . Cardiac defibrillator placement  07/09/2015  . Eye surgery Left ~ 2013    "eye infection"  . Ep implantable device N/A 07/09/2015    Procedure: ICD Implant;  Surgeon: Deboraha Sprang, MD;  Location: [facility];  Service: Cardiovascular;  Laterality: N/A;    Current  Outpatient Prescriptions  Medication Sig Dispense Refill  . aspirin 81 MG EC tablet TAKE ONE TABLET BY MOUTH ONCE DAILY 30 tablet 0  . carvedilol (COREG) 6.25 MG tablet Take 1.5 tablets (9.375 mg total) by mouth 2 (two) times daily with a meal. 90 tablet 3  . digoxin (LANOXIN) 0.125 MG tablet TAKE ONE TABLET BY MOUTH ONCE DAILY 30 tablet 0  . furosemide (LASIX) 20 MG tablet Take 1 tablet (20 mg total) by mouth daily as needed for fluid or edema. (Patient not taking: Reported on 10/21/2015) 30 tablet 3  . hydrALAZINE (APRESOLINE) 25 MG tablet Take 1.5 tablets (37.5 mg total) by mouth 3 (three) times daily. 90 tablet 1  . isosorbide mononitrate (IMDUR) 60 MG 24 hr tablet Take 1 tablet (60 mg total) by mouth daily. 30 tablet 1  . isosorbide-hydrALAZINE (BIDIL) 20-37.5 MG tablet Take 1 tablet by mouth 3 (three) times daily. 90 tablet 3  . rosuvastatin (CRESTOR) 20 MG tablet Take 1 tablet (20 mg total) by mouth daily. 90 tablet 3  . sacubitril-valsartan (ENTRESTO) 24-26 MG Take 1 tablet by mouth 2 (two) times daily. 60 tablet 3  . spironolactone (ALDACTONE) 25 MG tablet Take 1 tablet (25 mg total) by mouth daily. 30 tablet 3   No current facility-administered medications for this visit.    Allergies:   Review of patient's allergies indicates no known allergies.   Social History: Social History   Social History  . Marital Status: Divorced    Spouse Name: N/A  . Number of Children: N/A  . Years of Education: N/A   Occupational History  . Not on file.  Social History Main Topics  . Smoking status: Current Every Day Smoker -- 1.00 packs/day for 40 years    Types: Cigarettes  . Smokeless tobacco: Never Used  . Alcohol Use: Yes     Comment: 07/09/2015 "I haven't had a drink since last of 12/2014"  . Drug Use: Yes    Special: "Crack" cocaine, Marijuana     Comment: 07/09/2015 "I did drugs a long time;ago  quit in the mid 1980's"  . Sexual Activity: Not Currently   Other Topics Concern  .  Not on file   Social History Narrative    Family History: Family History  Problem Relation Age of Onset  . Stroke Mother   . Heart Problems      Review of Systems: All other systems reviewed and are otherwise negative except as noted above.   Physical Exam: VS:  BP 130/62 mmHg  Pulse 68  Ht 6\' 1"  (1.854 m)  Wt 159 lb (72.122 kg)  BMI 20.98 kg/m2 , BMI Body mass index is 20.98 kg/(m^2).  GEN- The patient is well appearing, alert and oriented x 3 today.   HEENT: normocephalic, atraumatic; sclera clear, conjunctiva pink; hearing intact; oropharynx clear; neck supple Lungs- Clear to ausculation bilaterally, normal work of breathing.  No wheezes, rales, rhonchi Heart- Regular rate and rhythm GI- soft, non-tender, non-distended, bowel sounds present Extremities- no clubbing, cyanosis, or edema; DP/PT/radial pulses 2+ bilaterally MS- no significant deformity or atrophy Skin- warm and dry, no rash or lesion; ICD pocket well healed Psych- euthymic mood, full affect Neuro- strength and sensation are intact  ICD interrogation- reviewed in detail today,  See PACEART report  EKG:  EKG is not ordered today.  Recent Labs: 02/04/2015: ALT 14*; Magnesium 1.9; TSH 0.606 06/30/2015: Hemoglobin 14.9; Platelets 248 08/29/2015: B Natriuretic Peptide 68.8 10/21/2015: BUN 10; Creatinine, Ser 0.91; Potassium 4.1; Sodium 141   Wt Readings from Last 3 Encounters:  10/29/15 159 lb (72.122 kg)  10/21/15 158 lb 6.4 oz (71.85 kg)  10/09/15 162 lb 9.6 oz (73.755 kg)     Other studies Reviewed: Additional studies/ records that were reviewed today include: Dr Olin Pia notes, HF clinic notes   Assessment and Plan:  1.  Adhesive capsulitis Not improved with exercises Will refer to physical therapy today   2.  Chronic systolic heart failure Euvolemic on exam Recent normal device interrogation Merlin, follow up Dr Caryl Comes 9 months    Current medicines are reviewed at length with the patient  today.   The patient does not have concerns regarding his medicines.  The following changes were made today:  none  Labs/ tests ordered today include: none  Disposition:   Follow up with Merlin transmissions, Dr Caryl Comes 9 months    Signed, Chanetta Maggard, NP 10/29/2015 1:09 PM  [facility] [address] [place] [address] [phone] (office) [phone] (fax)

## 2015-10-29 ENCOUNTER — Encounter: Payer: Self-pay | Admitting: Nurse Practitioner

## 2015-10-29 ENCOUNTER — Ambulatory Visit (HOSPITAL_COMMUNITY): Payer: BLUE CROSS/BLUE SHIELD | Attending: Internal Medicine

## 2015-10-29 ENCOUNTER — Ambulatory Visit (INDEPENDENT_AMBULATORY_CARE_PROVIDER_SITE_OTHER): Payer: BLUE CROSS/BLUE SHIELD | Admitting: Nurse Practitioner

## 2015-10-29 VITALS — BP 130/62 | HR 68 | Ht 73.0 in | Wt 159.0 lb

## 2015-10-29 DIAGNOSIS — I5022 Chronic systolic (congestive) heart failure: Secondary | ICD-10-CM | POA: Diagnosis not present

## 2015-10-29 DIAGNOSIS — I509 Heart failure, unspecified: Secondary | ICD-10-CM | POA: Diagnosis present

## 2015-10-29 DIAGNOSIS — M79602 Pain in left arm: Secondary | ICD-10-CM

## 2015-10-29 DIAGNOSIS — M7502 Adhesive capsulitis of left shoulder: Secondary | ICD-10-CM

## 2015-10-29 NOTE — Patient Instructions (Addendum)
Medication Instructions:   Your physician recommends that you continue on your current medications as directed. Please refer to the Current Medication list given to you today.   If you need a refill on your cardiac medications before your next appointment, please call your pharmacy.  Labwork:  NONE ORDER TODAY    Testing/Procedures:  YOU HAVE BEEN REFERRED TO PHYSICAL THERAPY FOR LEFT ARM PAIN    Follow-Up:  Your physician wants you to follow-up in:  IN  9  MONTHS WITH DR Gari Crown will receive a reminder letter in the mail two months in advance. If you don't receive a letter, please call our office to schedule the follow-up appointment.  Remote monitoring is used to monitor your Pacemaker of ICD from home. This monitoring reduces the number of office visits required to check your device to one time per year. It allows [place] to keep an eye on the functioning of your device to ensure it is working properly. You are scheduled for a device check from home on .01/26/16..You may send your transmission at any time that day. If you have a wireless device, the transmission will be sent automatically. After your physician reviews your transmission, you will receive a postcard with your next transmission date.     Any Other Special Instructions Will Be Listed Below (If Applicable).

## 2015-10-31 ENCOUNTER — Encounter: Payer: BLUE CROSS/BLUE SHIELD | Admitting: Nurse Practitioner

## 2015-10-31 IMAGING — DX DG CHEST 2V
2 series · 2 of 2 positions shown · non-contrast
Comparison: 10/26/2011 and earlier, including coronary CTA
06/16/2008.

CLINICAL DATA: Bradycardia. Shortness of breath. Acute onset of
back pain yesterday after lifting something heavy.

EXAM:
CHEST  2 VIEW

[chest pa]
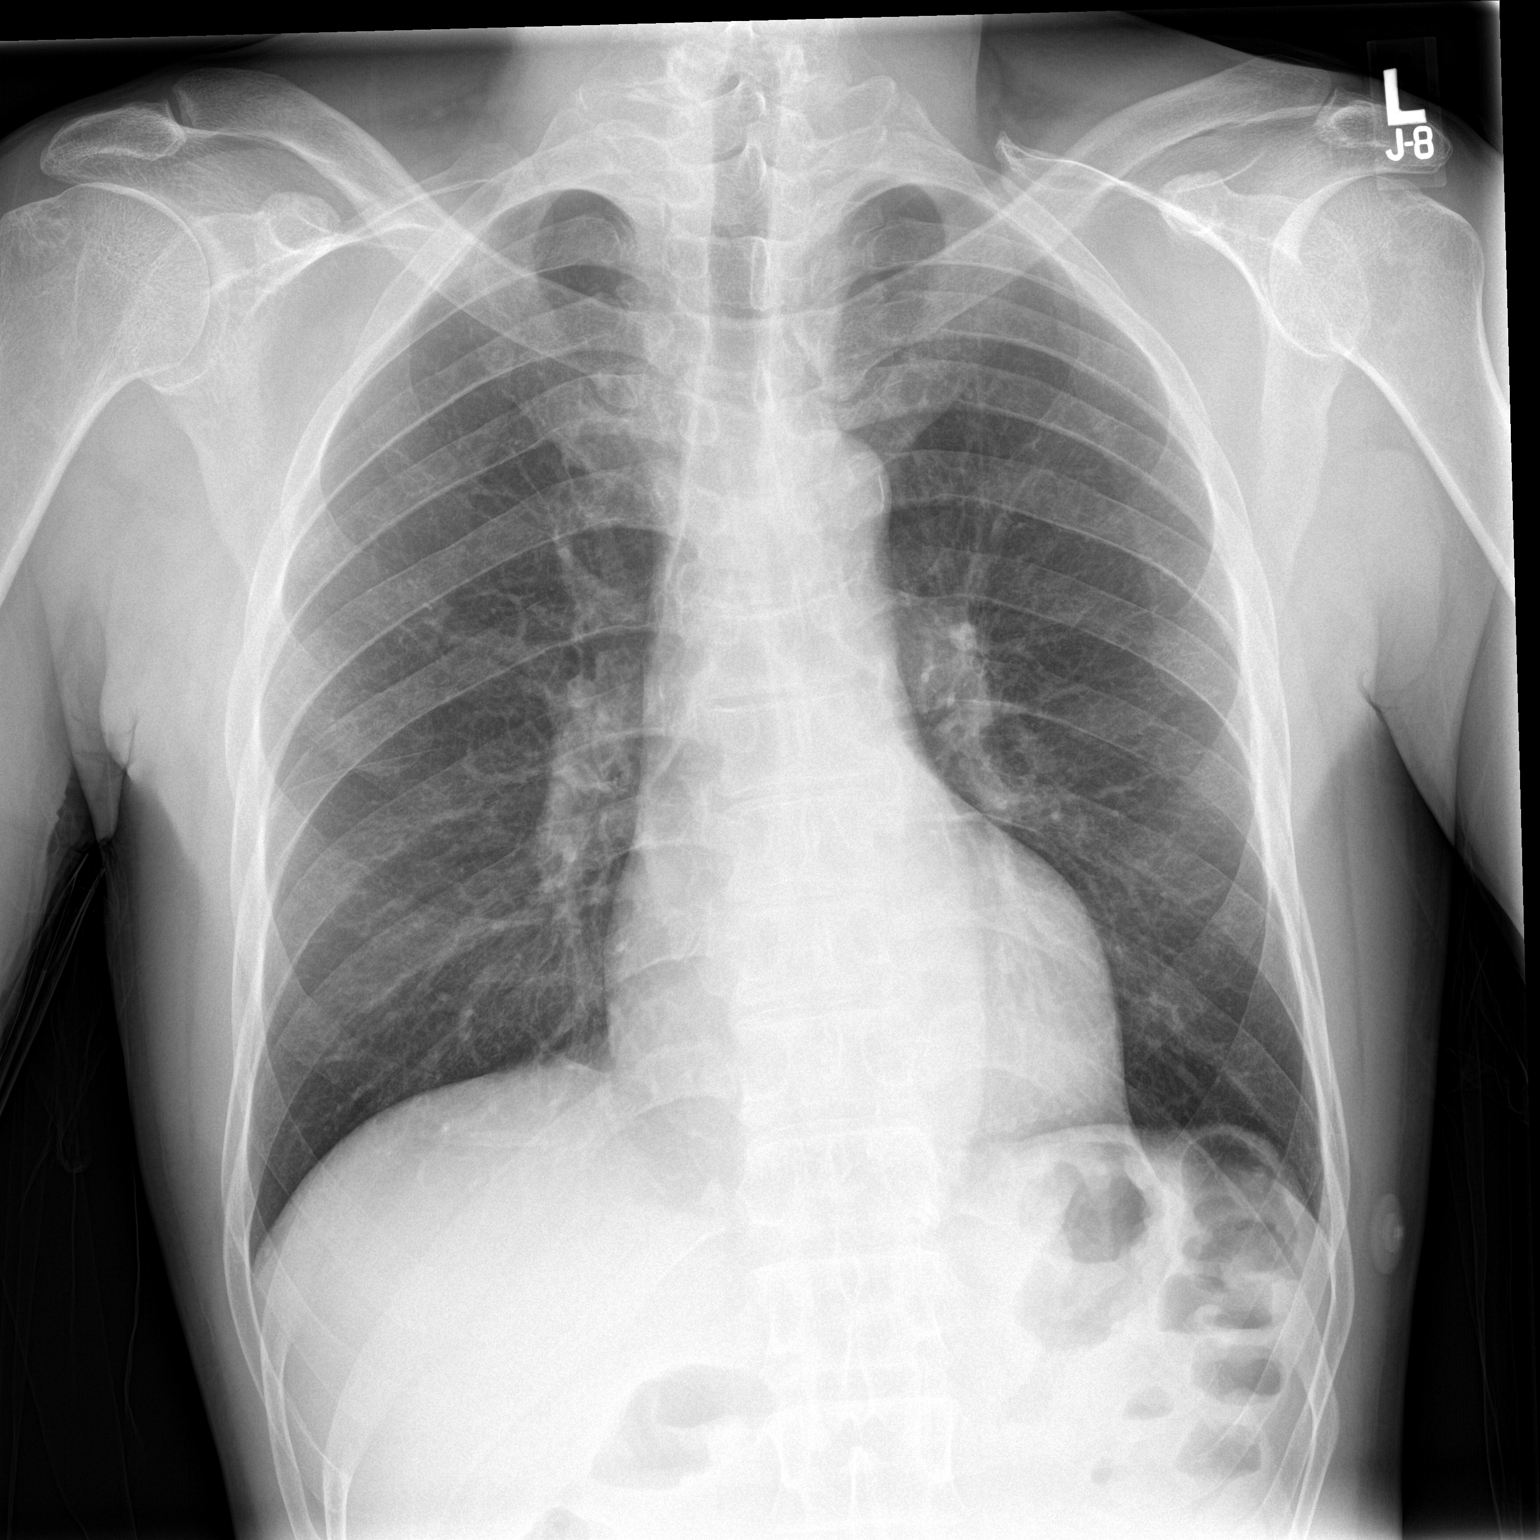

[chest lat]
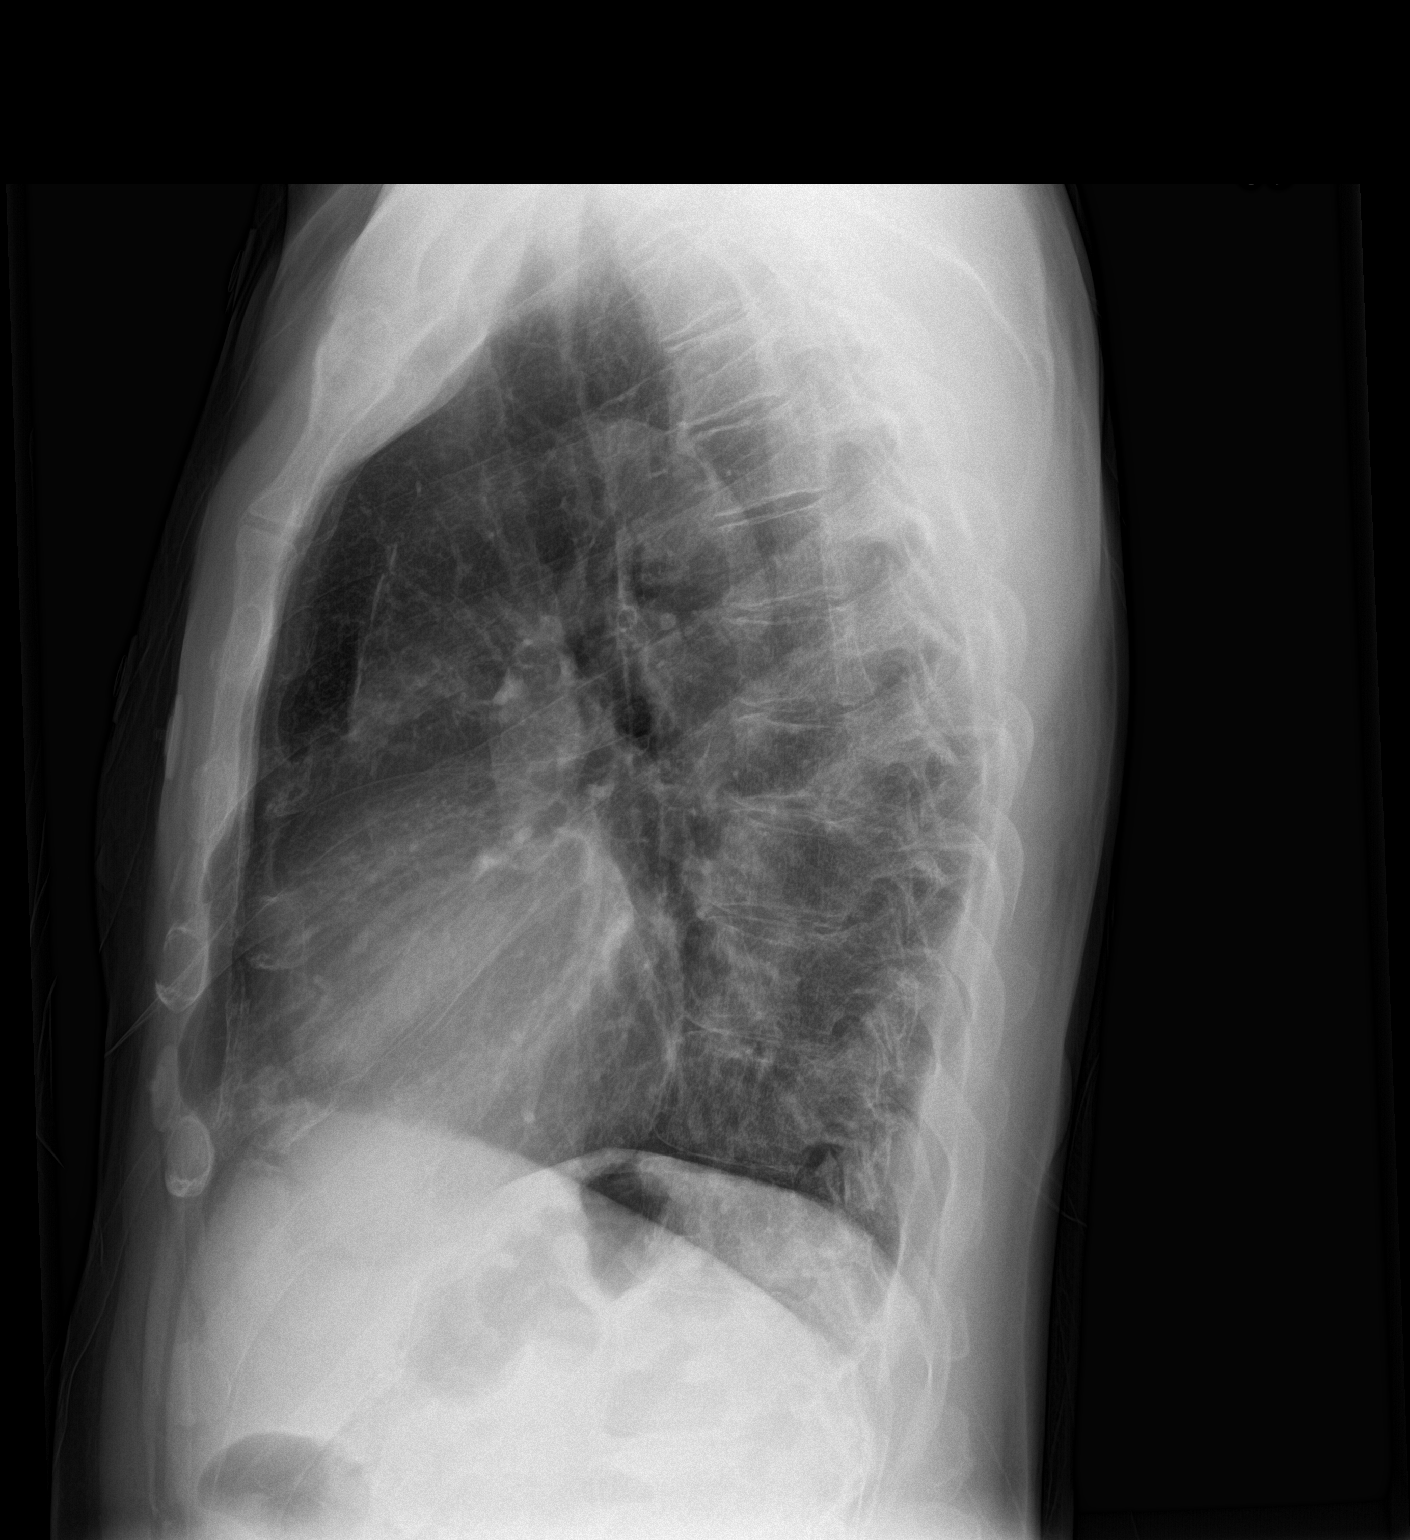

[2 of 2 positions shown; findings below may reference images not displayed]

FINDINGS: Cardiac silhouette upper normal in size to slightly enlarged,
unchanged. Thoracic aorta mildly tortuous and atherosclerotic,
unchanged. Hilar and mediastinal contours otherwise unremarkable.
Lungs clear. Bronchovascular markings normal. Pulmonary vascularity
normal. No visible pleural effusions. No pneumothorax. Visualized
bony thorax intact.
IMPRESSION: Stable borderline heart size.  No acute cardiopulmonary disease.

## 2015-11-04 ENCOUNTER — Other Ambulatory Visit (HOSPITAL_COMMUNITY): Payer: Self-pay | Admitting: *Deleted

## 2015-11-04 DIAGNOSIS — I509 Heart failure, unspecified: Secondary | ICD-10-CM | POA: Diagnosis not present

## 2015-11-05 ENCOUNTER — Ambulatory Visit (HOSPITAL_COMMUNITY)
Admission: RE | Admit: 2015-11-05 | Discharge: 2015-11-05 | Disposition: A | Discharge status: Home / Self Care | Payer: BLUE CROSS/BLUE SHIELD | Source: Ambulatory Visit | Attending: Internal Medicine | Admitting: Internal Medicine

## 2015-11-05 VITALS — BP 140/76 | HR 51 | Wt 158.6 lb

## 2015-11-05 DIAGNOSIS — I110 Hypertensive heart disease with heart failure: Secondary | ICD-10-CM | POA: Insufficient documentation

## 2015-11-05 DIAGNOSIS — I739 Peripheral vascular disease, unspecified: Secondary | ICD-10-CM | POA: Diagnosis not present

## 2015-11-05 DIAGNOSIS — E785 Hyperlipidemia, unspecified: Secondary | ICD-10-CM | POA: Diagnosis not present

## 2015-11-05 DIAGNOSIS — F17219 Nicotine dependence, cigarettes, with unspecified nicotine-induced disorders: Secondary | ICD-10-CM

## 2015-11-05 DIAGNOSIS — Z79899 Other long term (current) drug therapy: Secondary | ICD-10-CM | POA: Diagnosis not present

## 2015-11-05 DIAGNOSIS — F17210 Nicotine dependence, cigarettes, uncomplicated: Secondary | ICD-10-CM | POA: Insufficient documentation

## 2015-11-05 DIAGNOSIS — I5022 Chronic systolic (congestive) heart failure: Secondary | ICD-10-CM | POA: Insufficient documentation

## 2015-11-05 DIAGNOSIS — I10 Essential (primary) hypertension: Secondary | ICD-10-CM

## 2015-11-05 DIAGNOSIS — I428 Other cardiomyopathies: Secondary | ICD-10-CM

## 2015-11-05 MED ORDER — DIGOXIN 125 MCG PO TABS: 125.0000 ug | ORAL_TABLET | Freq: Every day | ORAL | Status: DC

## 2015-11-05 MED ORDER — ASPIRIN 81 MG PO TBEC: 81.0000 mg | DELAYED_RELEASE_TABLET | Freq: Every day | ORAL | Status: DC

## 2015-11-05 MED ORDER — SACUBITRIL-VALSARTAN 49-51 MG PO TABS: 1.0000 | ORAL_TABLET | Freq: Two times a day (BID) | ORAL | Status: DC

## 2015-11-05 NOTE — Progress Notes (Signed)
HPI:  Mr. Bonebrake is a 60 year old AA male with history of ETOH abuse found to have EF 10% by ECHO in May 2016.He also has hypertension, hyperlipidemia, PAD and continues to smoke about 1 ppd.   Mr Stefanovich is here today for pharmacist HF medication titration. At last clinic visit on 1/24, his Bidil was restarted after being out of it for >1 week. He brought all of his medication bottles with him today which are all in date and he reports good compliance although he did not take his morning doses of medications yet today.   . Shortness of breath/dyspnea on exertion? Yes after walking long distances - stable  . Orthopnea/PND? Yes uses 1-2 pillows - stable . Edema? no . Lightheadedness/dizziness? no . Daily weights at home? Yes - stable . Blood pressure/heart rate monitoring at home? no . Following low-sodium/fluid-restricted diet? Yes - does not add salt and tries to stay away from canned/frozen foods, drinks sweet tea and is attempting to decrease intake currently  HF Medications: Hydralazine 37.5 mg PO TID Imdur 60 mg PO daily   Carvedilol 9.375 mg PO BID Digoxin 0.125 mg PO daily Entresto 24-26 mg PO BID Spironolactone 25 mg PO daily  Furosemide 20 mg PO daily PRN for edema  Has the patient been experiencing any side effects to the medications prescribed?  no  Does the patient have any problems obtaining medications due to transportation or finances?   Yes - appeal for Bidil pending - has [employer] insurance  Understanding of regimen: good Understanding of indications: good Potential of compliance: good Patient understands to avoid NSAIDs. Patient understands to avoid decongestants.    Pertinent Lab Values: . 10/21/2015 - Serum creatinine 0.91 (BL 0.9, CrCl 85 ml/min), CO2 27, Potassium 4.1, Sodium 141 . BNP 68.8 (08/29/15), Digoxin <0.2 (08/29/15)   Vital Signs: . Weight: 158 lb (dry weight: 158 lb) . Blood pressure: 140/76 mmHg . Heart rate: 51 bpm  Heart Failure Assessment  & Plan: . Ejection fraction: 20-25% (06/27/15) . NYHA symptom class: II . Based on clinical presentation, vital signs, and recent labs, will recommend to increase Entresto to 49-51 mg PO BID  Summary: 1. Chronic systolic heart failure 2. Hypertension 3. Hyperlipidemia 4. PAD 5. Tobacco abuse  During today's visit, all of Mr. Zweifel HF medications were reviewed with him at length and he verbalized understanding of the importance of consistent utilization. He reports good compliance with his regimen and states that he has not needed to use any PRN lasix. I re-emphasized the importance of continuing a low sodium diet and cutting back on his fluid intake to <2L/day. We also discussed his willingness to quit smoking which is at a 7/10 today and discussed some options for cessation including nicotine patches and gums. If these don't work for him, we could consider Chantix at next visit. Recently, he was switched from Bidil to hydralazine + Imdur since the Bidil required a PA. The PA was denied and the appeal is currently in process so I have advised him to continue the separate components and I will update him when the decision is made. I have also provided him with a coupon card which should help with his copays once it goes through his insurance. To optimize his regimen since he has BP and recent BMET stable, I have recommended an increase in his Entresto to 49-51 mg PO BID. We discussed side effects to look out for including excessive dizziness and drops in BP. I have provided  him with a 7-day sample to cover him until the new PA for the higher dose is approved.   1) Medication changes: Increase Entresto to 49-51 mg PO BID  2) Labs: BMET in 2 weeks  3) Follow-up: Dr. Aundra Dubin on 12/19/15  --------  Medication Samples have been provided to the patient.  Drug name: Entresto 49-51 mg    Qty: 14     LOT: [id number] Exp.Date: 06/2017  The patient has been instructed regarding the correct time, dose, and  frequency of taking this medication, including desired effects and most common side effects.   --------  Ruta Hinds. Velva Harman, PharmD, BCPS, CPP Clinical Pharmacist Pager: [phone] Phone: [phone] 11/05/2015 1:30 PM

## 2015-11-05 NOTE — Patient Instructions (Signed)
Labs needed in 2 weeks  INCREASE Entresto to 49/51 mg one tab twice a day   Your physician recommends that you schedule a follow-up appointment as scheduled

## 2015-11-21 ENCOUNTER — Ambulatory Visit (HOSPITAL_COMMUNITY)
Admission: RE | Admit: 2015-11-21 | Discharge: 2015-11-21 | Disposition: A | Discharge status: Home / Self Care | Payer: BLUE CROSS/BLUE SHIELD | Source: Ambulatory Visit | Attending: Internal Medicine | Admitting: Internal Medicine

## 2015-11-21 DIAGNOSIS — I5022 Chronic systolic (congestive) heart failure: Secondary | ICD-10-CM | POA: Insufficient documentation

## 2015-11-21 LAB — BASIC METABOLIC PANEL
ANION GAP: 11 (ref 5–15)
BUN: 11 mg/dL (ref 6–20)
CHLORIDE: 103 mmol/L (ref 101–111)
CO2: 24 mmol/L (ref 22–32)
Calcium: 9.7 mg/dL (ref 8.9–10.3)
Creatinine, Ser: 0.92 mg/dL (ref 0.61–1.24)
GFR calc Af Amer: >60 mL/min (ref >60)
GFR calc non Af Amer: >60 mL/min (ref >60)
Glucose, Bld: 172 mg/dL — ABNORMAL HIGH (ref 65–99)
POTASSIUM: 3.8 mmol/L (ref 3.5–5.1)
SODIUM: 138 mmol/L (ref 135–145)

## 2015-12-04 ENCOUNTER — Telehealth (HOSPITAL_COMMUNITY): Payer: Self-pay | Admitting: Pharmacist

## 2015-12-04 NOTE — Telephone Encounter (Signed)
Bidil appeal approved by [employer]. It will be officially closed out by tomorrow afternoon so it should be able to be processed on 3/11. He can also use the manufacturer coupon card to assist with his copay. This information will be relayed to the patient.   Ruta Hinds. Velva Harman, PharmD, BCPS, CPP Clinical Pharmacist Pager: [phone] Phone: [phone] 12/04/2015 12:23 PM

## 2015-12-05 ENCOUNTER — Telehealth (HOSPITAL_COMMUNITY): Payer: Self-pay

## 2015-12-05 ENCOUNTER — Other Ambulatory Visit (HOSPITAL_COMMUNITY): Payer: Self-pay | Admitting: *Deleted

## 2015-12-05 NOTE — Telephone Encounter (Signed)
[name] [name] left voice message to let him know his Bidil is approved and to discontinue isosorbide and hydralazine only take bidil. (JB)

## 2015-12-08 ENCOUNTER — Other Ambulatory Visit (HOSPITAL_COMMUNITY): Payer: Self-pay | Admitting: *Deleted

## 2015-12-08 DIAGNOSIS — I5022 Chronic systolic (congestive) heart failure: Secondary | ICD-10-CM

## 2015-12-08 MED ORDER — HYDRALAZINE HCL 25 MG PO TABS: 37.5000 mg | ORAL_TABLET | Freq: Three times a day (TID) | ORAL | Status: DC

## 2015-12-17 ENCOUNTER — Other Ambulatory Visit (HOSPITAL_COMMUNITY): Payer: Self-pay | Admitting: Pharmacist

## 2015-12-17 MED ORDER — ISOSORB DINITRATE-HYDRALAZINE 20-37.5 MG PO TABS: 1.0000 | ORAL_TABLET | Freq: Three times a day (TID) | ORAL | Status: DC

## 2015-12-19 ENCOUNTER — Ambulatory Visit (HOSPITAL_COMMUNITY)
Admission: RE | Admit: 2015-12-19 | Discharge: 2015-12-19 | Disposition: A | Discharge status: Home / Self Care | Payer: BLUE CROSS/BLUE SHIELD | Source: Ambulatory Visit | Attending: Cardiology | Admitting: Cardiology

## 2015-12-19 ENCOUNTER — Encounter (HOSPITAL_COMMUNITY): Payer: Self-pay

## 2015-12-19 VITALS — BP 112/56 | HR 54 | Wt 156.5 lb

## 2015-12-19 DIAGNOSIS — I428 Other cardiomyopathies: Secondary | ICD-10-CM | POA: Diagnosis not present

## 2015-12-19 DIAGNOSIS — F1010 Alcohol abuse, uncomplicated: Secondary | ICD-10-CM | POA: Insufficient documentation

## 2015-12-19 DIAGNOSIS — E785 Hyperlipidemia, unspecified: Secondary | ICD-10-CM | POA: Diagnosis not present

## 2015-12-19 DIAGNOSIS — I739 Peripheral vascular disease, unspecified: Secondary | ICD-10-CM | POA: Insufficient documentation

## 2015-12-19 DIAGNOSIS — I2510 Atherosclerotic heart disease of native coronary artery without angina pectoris: Secondary | ICD-10-CM | POA: Diagnosis not present

## 2015-12-19 DIAGNOSIS — I5022 Chronic systolic (congestive) heart failure: Secondary | ICD-10-CM

## 2015-12-19 DIAGNOSIS — Z8249 Family history of ischemic heart disease and other diseases of the circulatory system: Secondary | ICD-10-CM | POA: Insufficient documentation

## 2015-12-19 DIAGNOSIS — Z7982 Long term (current) use of aspirin: Secondary | ICD-10-CM | POA: Insufficient documentation

## 2015-12-19 DIAGNOSIS — Z823 Family history of stroke: Secondary | ICD-10-CM | POA: Diagnosis not present

## 2015-12-19 DIAGNOSIS — F17210 Nicotine dependence, cigarettes, uncomplicated: Secondary | ICD-10-CM | POA: Insufficient documentation

## 2015-12-19 DIAGNOSIS — I110 Hypertensive heart disease with heart failure: Secondary | ICD-10-CM | POA: Insufficient documentation

## 2015-12-19 DIAGNOSIS — Z79899 Other long term (current) drug therapy: Secondary | ICD-10-CM | POA: Diagnosis not present

## 2015-12-19 LAB — BASIC METABOLIC PANEL
Anion gap: 8 (ref 5–15)
BUN: 10 mg/dL (ref 6–20)
CHLORIDE: 110 mmol/L (ref 101–111)
CO2: 23 mmol/L (ref 22–32)
Calcium: 9.6 mg/dL (ref 8.9–10.3)
Creatinine, Ser: 0.92 mg/dL (ref 0.61–1.24)
GFR calc Af Amer: >60 mL/min (ref >60)
GFR calc non Af Amer: >60 mL/min (ref >60)
Glucose, Bld: 115 mg/dL — ABNORMAL HIGH (ref 65–99)
POTASSIUM: 3.7 mmol/L (ref 3.5–5.1)
Sodium: 141 mmol/L (ref 135–145)

## 2015-12-19 LAB — DIGOXIN LEVEL: DIGOXIN LVL: 0.3 ng/mL — AB (ref 0.8–2.0)

## 2015-12-19 MED ORDER — ISOSORB DINITRATE-HYDRALAZINE 20-37.5 MG PO TABS: 1.5000 | ORAL_TABLET | Freq: Three times a day (TID) | ORAL | Status: DC

## 2015-12-19 NOTE — Progress Notes (Addendum)
Advanced Heart Failure Medication Review by a Pharmacist  Does the patient  feel that his/her medications are working for him/her?  yes  Has the patient been experiencing any side effects to the medications prescribed?  no  Does the patient measure his/her own blood pressure or blood glucose at home?  no   Does the patient have any problems obtaining medications due to transportation or finances?   no  Understanding of regimen: good Understanding of indications: fair Potential of compliance: fair Patient understands to avoid NSAIDs. Patient understands to avoid decongestants.  Issues to address at subsequent visits: Adherence   Pharmacist comments: 60 YO pleasant male presenting to HF clinic for followup.  Pt reports he was able to get his BiDil and is taking it as prescribed TID. Pt states he is taking Carvedilol 6.25 QAM and 12.5 QPM instead of the prescribed Coreg 9.375 mg BID. He reports no SE or problems obtaining his medications. He does state that he takes the [facility] 49-51 and that he has stopped isosorbide/hydralazine for BiDil. He denies needing Lasix PRN.     Time with patient: 5 min  Preparation and documentation time: 5 min  Total time: 10 min

## 2015-12-19 NOTE — Patient Instructions (Signed)
INCREASE Bidil to 1.5 tabs twice daily. Pharmacy: [id number] - [place], [name] [name] [Patient Preferred] [phone]  Routine lab work today. Will notify you of abnormal results, otherwise no news is good news!  Follow up 2 months with echocardiogram.  Do the following things EVERYDAY: 1) Weigh yourself in the morning before breakfast. Write it down and keep it in a log. 2) Take your medicines as prescribed 3) Eat low salt foods-Limit salt (sodium) to 2000 mg per day.  4) Stay as active as you can everyday 5) Limit all fluids for the day to less than 2 liters

## 2015-12-19 NOTE — Progress Notes (Signed)
Patient ID: [name] [name], male   DOB: [birth year], 60 y.o.   MRN: [id number]    Advanced Heart Failure Clinic Note   PCP: Dr Doreene Burke Primary Cardiologist: Dr Aundra Dubin PARAMEDICINE Patient.   HPI: Mr Keener is a 60 year old with history of ETOH was found to have EF 10% by echo. He had exertional dyspnea and chest pain, taken for [facility] 5/16 showing nonobstructive CAD, elevated filling pressures (but not marked elevation), and low cardiac index. He was admitted to optimize HF. He was diuresed with IV lasix and transitioned to po lasix. Overall he diuresed 9 pounds. He continued on low dose carvedilol, lisinopril, bidil, and spironolactone.   Mr Gordin returns today for regular follow up. At last visit we restarted his Bidil which he had been off for a week.  He has also seen the HF Pharmacist recently and had his Entresto increased. Down 2-3lbs from last visit. Overall feels pretty good. Can walk around a grocery store without difficulty. Avoids stairs with his hip.  Still having some calf pain, but stable. Still smoking about 1 pack a day. No ETOH. No CP. Does have L sided pain from adhesive shoulder, sees PT every Tuesday. No dizziness or lightheadedness.   CPX in 2/17 was limited by hip pain => submaximal.   Labs (5/16): K 3.8, creatinine 0.94 => 1.19, HCT 48.4, LDL 121, HDL 50, digoxin 0.3, BNP 2343 => 367 Labs (6/16): K 4, creatinine 1.06 Labs (04/18/15): K 4.3 Creatinine 0.91 Labs (05/07/2015): K 4.5 Creatinine 0.88  Labs (10/16): K 4.6, creatinine 0.95, digoxin 0.5, TC 163 Labs (12/16) K 4.2, creatinine 0.90 Labs (2/17): K 3.8, creatinine 0.92  PMH: 1. Low back pain 2. HTN 3. Hyperlipidemia 4. H/o cocaine abuse: Not active. 5. ETOH abuse: 1 pint/day 6. CAD: Coronary CT angiogram in 9/09 showed possible significant obstructive disease in the circumflex. He did not have cardiac cath.  7. Cardiomyopathy: Echo (9/09) with EF 35-40%. Echo (5/16) with EF 10%, mild LV dilation,  grade II diastolic dysfunction, mildly dilated RV with moderately decreased systolic function. LHC/RHC (5/16): Nonobstructive coronary disease, mean RA 10, PA 49/28, mean PCWP 25, CI 1.4.  Echo (9/16) with EF 20-25%, severe LV dilation. St Jude ICD.  - CPX 11/04/15: FVC 2.85 FEV1 1.98, MVV 89, Peak RER 0.94 (submaximal), peak VO2 13 (41.2% predicted), [id number] slope: 26.  This study was limited by hip pain.  8. Active smoker 9. PAD: ABI L normal, R mildly decreased.   SH: Lives in [place], works as Dealer, lives with son, smokes 1 ppd, drank 1 pint/day liquor => quit 5/16, prior cocaine use.   FH: Brother with MI in his 32s, another brother with ?SCD, mother with CVA.   ROS: All systems reviewed and negative except as per HPI.   Current Outpatient Prescriptions  Medication Sig Dispense Refill  . aspirin 81 MG EC tablet Take 1 tablet (81 mg total) by mouth daily. Swallow whole. 30 tablet 6  . carvedilol (COREG) 6.25 MG tablet Take 6.25-12.5 mg by mouth 2 (two) times daily with a meal. Take 6.25 mg by mouth every morning and 12.5 mg every evening    . digoxin (LANOXIN) 0.125 MG tablet Take 1 tablet (125 mcg total) by mouth daily. 30 tablet 6  . isosorbide-hydrALAZINE (BIDIL) 20-37.5 MG tablet Take 1 tablet by mouth 3 (three) times daily. 90 tablet 5  . rosuvastatin (CRESTOR) 20 MG tablet Take 1 tablet (20 mg total) by mouth daily. 90 tablet 3  .  sacubitril-valsartan (ENTRESTO) 49-51 MG Take 1 tablet by mouth 2 (two) times daily. 60 tablet 6  . spironolactone (ALDACTONE) 25 MG tablet Take 1 tablet (25 mg total) by mouth daily. 30 tablet 3  . furosemide (LASIX) 20 MG tablet Take 1 tablet (20 mg total) by mouth daily as needed for fluid or edema. (Patient not taking: Reported on 10/21/2015) 30 tablet 3   No current facility-administered medications for this encounter.    Filed Vitals:   12/19/15 1012  BP: 112/56  Pulse: 54  Weight: 156 lb 8 oz (70.988 kg)  SpO2: 98%    Wt Readings  from Last 3 Encounters:  12/19/15 156 lb 8 oz (70.988 kg)  11/05/15 158 lb 9.6 oz (71.94 kg)  10/29/15 159 lb (72.122 kg)     PHYSICAL EXAM: General:  Thin appearing. No resp difficulty. Ambulated into clinic without difficulty HEENT: normal Neck: supple. JVP 6-7. Carotids 2+ bilaterally; no bruits. No thyromegaly or nodule noted. Cor: PMI laterally displaced. RRR, No M/G/R Unable to palpate pedal pulses.  Lungs: CTAB, normal effort Abdomen: soft, NT, ND, no HSM. No bruits or masses. +BS  Extremities: no cyanosis, clubbing, rash, edema Neuro: alert & orientedx3, cranial nerves grossly intact. Moves all 4 extremities w/o difficulty. Affect pleasant.  ASSESSMENT & PLAN: 1. Chronic Systolic HF: Nonischemic cardiomyopathy with low output per RHC/LHC in 5/16.  Echo (9/16) with EF 20-25%, severe LV dilation s/p St Jude ICD.  Possible ETOH cardiomyopathy. NYHA class II symptoms, seems to be doing better overall.  Limited literacy.  Volume status stable on exam.  CPX was submaximal due to hip pain so hard to interpret.  - Has not needed lasix.  Continue to use only as needed for weight gains of 3 lbs over night or 5 lbs in a weeks time.  - Continue Coreg 9.375 mg BID. (Has been taking 6.25 q am, and 12.5 q pm) Will not increase for now with bradycardia.  - Continue Spironolactone 25 mg daily.  BMET today.  - Increase Bidil to 1.5 tabs tid.  - Continue Entresto 49-51 mg bid.  - Continue Digoxin 0.125 daily, check level today.  - Repeat echo in 2 months.  2. ETOH Abuse: Former heavy drinker, he has quit.   3. Current Smoker: Continues to smoke 1ppd. Re-educated on this being one of the primary contributing factor to his PAD, and encouraged cessation. 4. PAD: Mild claudication.  ABIs not markedly abnormal 01/2015.   - Continued to encourage him to quit smoking.   - Continue ASA 81 and Crestor 20 daily.  - Encouraged to increase activity as able.   5. Hyperlipidemia: Goal LDL < 70 with PAD -  Continue Crestor 20 mg daily.    - Can check at next MD visit.  5. CAD: Nonobstructive. No evidence of ischemia.   Shirley Friar PA-C 12/19/2015   Patient seen with PA, agree with the above note.  He is symptomatically stable, NYHA class II.  Not volume overloaded on exam.  Increase Bidil to 1.5 tabs tid today and return in 2 months with echo.   [name] 12/21/2015

## 2016-01-10 ENCOUNTER — Other Ambulatory Visit: Payer: Self-pay | Admitting: Cardiology

## 2016-01-13 ENCOUNTER — Other Ambulatory Visit (HOSPITAL_COMMUNITY): Payer: Self-pay | Admitting: *Deleted

## 2016-01-13 DIAGNOSIS — I5022 Chronic systolic (congestive) heart failure: Secondary | ICD-10-CM

## 2016-01-13 MED ORDER — SPIRONOLACTONE 25 MG PO TABS: 25.0000 mg | ORAL_TABLET | Freq: Every day | ORAL | Status: DC

## 2016-01-19 ENCOUNTER — Other Ambulatory Visit (HOSPITAL_COMMUNITY): Payer: Self-pay | Admitting: *Deleted

## 2016-01-19 MED ORDER — CARVEDILOL 6.25 MG PO TABS: ORAL_TABLET | ORAL | Status: DC

## 2016-01-26 ENCOUNTER — Telehealth: Payer: Self-pay | Admitting: Cardiology

## 2016-01-26 ENCOUNTER — Ambulatory Visit (INDEPENDENT_AMBULATORY_CARE_PROVIDER_SITE_OTHER): Payer: BLUE CROSS/BLUE SHIELD | Admitting: *Deleted

## 2016-01-26 DIAGNOSIS — I429 Cardiomyopathy, unspecified: Secondary | ICD-10-CM

## 2016-01-26 DIAGNOSIS — I428 Other cardiomyopathies: Secondary | ICD-10-CM

## 2016-01-26 NOTE — Progress Notes (Signed)
Remote ICD transmission.   

## 2016-01-26 NOTE — Telephone Encounter (Signed)
Spoke with pt and reminded pt of remote transmission that is due today. Pt verbalized understanding.   

## 2016-02-16 ENCOUNTER — Telehealth (HOSPITAL_COMMUNITY): Payer: Self-pay | Admitting: Vascular Surgery

## 2016-02-16 NOTE — Telephone Encounter (Signed)
Left pt message to change appt time 02/18/16

## 2016-02-18 ENCOUNTER — Other Ambulatory Visit (HOSPITAL_COMMUNITY): Payer: Self-pay | Admitting: *Deleted

## 2016-02-18 ENCOUNTER — Ambulatory Visit (HOSPITAL_COMMUNITY)
Admission: RE | Admit: 2016-02-18 | Discharge: 2016-02-18 | Disposition: A | Discharge status: Home / Self Care | Payer: BLUE CROSS/BLUE SHIELD | Source: Ambulatory Visit | Attending: Internal Medicine | Admitting: Internal Medicine

## 2016-02-18 ENCOUNTER — Ambulatory Visit (HOSPITAL_BASED_OUTPATIENT_CLINIC_OR_DEPARTMENT_OTHER)
Admission: RE | Admit: 2016-02-18 | Discharge: 2016-02-18 | Disposition: A | Discharge status: Home / Self Care | Payer: BLUE CROSS/BLUE SHIELD | Source: Ambulatory Visit | Attending: Cardiology | Admitting: Cardiology

## 2016-02-18 VITALS — BP 112/64 | HR 63 | Wt 156.5 lb

## 2016-02-18 DIAGNOSIS — I2510 Atherosclerotic heart disease of native coronary artery without angina pectoris: Secondary | ICD-10-CM | POA: Insufficient documentation

## 2016-02-18 DIAGNOSIS — N529 Male erectile dysfunction, unspecified: Secondary | ICD-10-CM | POA: Diagnosis not present

## 2016-02-18 DIAGNOSIS — I5022 Chronic systolic (congestive) heart failure: Secondary | ICD-10-CM

## 2016-02-18 DIAGNOSIS — I428 Other cardiomyopathies: Secondary | ICD-10-CM | POA: Diagnosis not present

## 2016-02-18 DIAGNOSIS — Z95810 Presence of automatic (implantable) cardiac defibrillator: Secondary | ICD-10-CM | POA: Diagnosis not present

## 2016-02-18 DIAGNOSIS — I10 Essential (primary) hypertension: Secondary | ICD-10-CM

## 2016-02-18 DIAGNOSIS — Z8249 Family history of ischemic heart disease and other diseases of the circulatory system: Secondary | ICD-10-CM | POA: Insufficient documentation

## 2016-02-18 DIAGNOSIS — Z79899 Other long term (current) drug therapy: Secondary | ICD-10-CM | POA: Diagnosis not present

## 2016-02-18 DIAGNOSIS — I110 Hypertensive heart disease with heart failure: Secondary | ICD-10-CM | POA: Insufficient documentation

## 2016-02-18 DIAGNOSIS — E785 Hyperlipidemia, unspecified: Secondary | ICD-10-CM | POA: Diagnosis not present

## 2016-02-18 DIAGNOSIS — F17210 Nicotine dependence, cigarettes, uncomplicated: Secondary | ICD-10-CM | POA: Insufficient documentation

## 2016-02-18 DIAGNOSIS — I739 Peripheral vascular disease, unspecified: Secondary | ICD-10-CM | POA: Insufficient documentation

## 2016-02-18 DIAGNOSIS — Z7982 Long term (current) use of aspirin: Secondary | ICD-10-CM | POA: Diagnosis not present

## 2016-02-18 DIAGNOSIS — F1010 Alcohol abuse, uncomplicated: Secondary | ICD-10-CM | POA: Diagnosis not present

## 2016-02-18 DIAGNOSIS — Z823 Family history of stroke: Secondary | ICD-10-CM | POA: Diagnosis not present

## 2016-02-18 DIAGNOSIS — I429 Cardiomyopathy, unspecified: Secondary | ICD-10-CM

## 2016-02-18 LAB — LIPID PANEL
CHOL/HDL RATIO: 4.2 ratio
CHOLESTEROL: 152 mg/dL (ref 0–200)
HDL: 36 mg/dL — ABNORMAL LOW (ref >40)
LDL CALC: 101 mg/dL — AB (ref 0–99)
TRIGLYCERIDES: 74 mg/dL (ref <150)
VLDL: 15 mg/dL (ref 0–40)

## 2016-02-18 LAB — BASIC METABOLIC PANEL
Anion gap: 7 (ref 5–15)
BUN: 8 mg/dL (ref 6–20)
CHLORIDE: 104 mmol/L (ref 101–111)
CO2: 26 mmol/L (ref 22–32)
CREATININE: 0.92 mg/dL (ref 0.61–1.24)
Calcium: 9.7 mg/dL (ref 8.9–10.3)
GFR calc non Af Amer: >60 mL/min (ref >60)
Glucose, Bld: 94 mg/dL (ref 65–99)
POTASSIUM: 4 mmol/L (ref 3.5–5.1)
Sodium: 137 mmol/L (ref 135–145)

## 2016-02-18 MED ORDER — ISOSORB DINITRATE-HYDRALAZINE 20-37.5 MG PO TABS: 2.0000 | ORAL_TABLET | Freq: Three times a day (TID) | ORAL | Status: DC

## 2016-02-18 NOTE — Patient Instructions (Signed)
Increase Bidil to 2 tabs Three times a day   Labs today  Your physician recommends that you schedule a follow-up appointment in: 3 months

## 2016-02-18 NOTE — Progress Notes (Addendum)
Patient ID: [name] [name], male   DOB: Feb 05, 1956, 60 y.o.   MRN: [id number]    Advanced Heart Failure Clinic Note   PCP: Dr Doreene Burke Primary Cardiologist: Dr Aundra Dubin PARAMEDICINE Patient.   HPI: [name] [name] is a 60 year old with history of ETOH was found to have EF 10% by echo. He had exertional dyspnea and chest pain, taken for [facility] 5/16 showing nonobstructive CAD, elevated filling pressures (but not marked elevation), and low cardiac index. He was admitted to optimize HF. He was diuresed with IV lasix and transitioned to po lasix. Overall he diuresed 9 pounds. He continued on low dose carvedilol, lisinopril, bidil, and spironolactone.   [name] [name] returns today for regular follow up. At last visit we increased his Bidil and scheduled him for Echo. Weight unchanged from last visit.  Has feeling OK overall. Denies SOB on flat ground. Avoids steps with his hip pain. Continues about 1 pack per day.  Denies ETOH. Denies CP, lightheadedness, or dizziness. No orthopnea. Finished PT for L adhesive shoulder, states its doing "so-so". Calf pain stable to improved.    CPX in 2/17 was limited by hip pain => submaximal.   Labs (5/16): K 3.8, creatinine 0.94 => 1.19, HCT 48.4, LDL 121, HDL 50, digoxin 0.3, BNP 2343 => 367 Labs (6/16): K 4, creatinine 1.06 Labs (04/18/15): K 4.3 Creatinine 0.91 Labs (05/07/2015): K 4.5 Creatinine 0.88  Labs (10/16): K 4.6, creatinine 0.95, digoxin 0.5, TC 163 Labs (12/16) K 4.2, creatinine 0.90 Labs (2/17): K 3.8, creatinine 0.92  PMH: 1. Low back pain 2. HTN 3. Hyperlipidemia 4. H/o cocaine abuse: Not active. 5. ETOH abuse: 1 pint/day 6. CAD: Coronary CT angiogram in 9/09 showed possible significant obstructive disease in the circumflex. He did not have cardiac cath.  7. Cardiomyopathy: Echo (9/09) with EF 35-40%. Echo (5/16) with EF 10%, mild LV dilation, grade II diastolic dysfunction, mildly dilated RV with moderately decreased systolic function.  LHC/RHC (5/16): Nonobstructive coronary disease, mean RA 10, PA 49/28, mean PCWP 25, CI 1.4.  Echo (9/16) with EF 20-25%, severe LV dilation. St Jude ICD.  - CPX 11/04/15: FVC 2.85 FEV1 1.98, MVV 89, Peak RER 0.94 (submaximal), peak VO2 13 (41.2% predicted), [id number] slope: 26.  This study was limited by hip pain.  - Echo 02/18/16 LVEF 25-30%, Grade 1 DD, Mild LAE, RV mildly reduced.  8. Active smoker 9. PAD: ABI L normal, R mildly decreased.   SH: Lives in [place], works as Dealer, lives with son, smokes 1 ppd, drank 1 pint/day liquor => quit 5/16, prior cocaine use.   FH: Brother with MI in his 60s, another brother with ?SCD, mother with CVA.   ROS: All systems reviewed and negative except as per HPI.   Current Outpatient Prescriptions  Medication Sig Dispense Refill  . aspirin 81 MG EC tablet Take 1 tablet (81 mg total) by mouth daily. Swallow whole. 30 tablet 6  . carvedilol (COREG) 6.25 MG tablet TAKE 1 AND 1/2 TABLETS BY MOUTH TWICE DAILY WITH A MEAL 90 tablet 3  . digoxin (LANOXIN) 0.125 MG tablet Take 1 tablet (125 mcg total) by mouth daily. 30 tablet 6  . isosorbide-hydrALAZINE (BIDIL) 20-37.5 MG tablet Take 1.5 tablets by mouth 3 (three) times daily. 135 tablet 5  . rosuvastatin (CRESTOR) 20 MG tablet Take 1 tablet (20 mg total) by mouth daily. 90 tablet 3  . sacubitril-valsartan (ENTRESTO) 49-51 MG Take 1 tablet by mouth 2 (two) times daily. 60 tablet 6  .  spironolactone (ALDACTONE) 25 MG tablet Take 1 tablet (25 mg total) by mouth daily. 30 tablet 3   No current facility-administered medications for this encounter.    Filed Vitals:   02/18/16 1112  BP: 112/64  Pulse: 63  Weight: 156 lb 8 oz (70.988 kg)  SpO2: 99%    Wt Readings from Last 3 Encounters:  02/18/16 156 lb 8 oz (70.988 kg)  12/19/15 156 lb 8 oz (70.988 kg)  11/05/15 158 lb 9.6 oz (71.94 kg)     PHYSICAL EXAM: General:  Thin appearing. No resp difficulty. Ambulated into clinic without  difficulty HEENT: normal Neck: supple. JVP flat. Carotids 2+ bilaterally; no bruits. No thyromegaly or lymphadenopathy appreciated. Cor: PMI laterally displaced. RRR, No M/G/R Unable to palpate pedal pulses.  Lungs: Clear Abdomen: soft, non-tender, non-distended, no HSM. No bruits or masses. +BS  Extremities: no cyanosis, clubbing, rash, trace ankle edema Neuro: alert & orientedx3, cranial nerves grossly intact. Moves all 4 extremities w/o difficulty. Affect pleasant.  ASSESSMENT & PLAN: 1. Chronic Systolic HF: Nonischemic cardiomyopathy with low output per RHC/LHC in 5/16.  Echo (9/16) with EF 20-25%, severe LV dilation s/p St Jude ICD.  Possible ETOH cardiomyopathy. NYHA class II symptoms, seems to be doing better overall.  Limited literacy.  Volume status stable on exam.  CPX was submaximal due to hip pain so hard to interpret.  - Echo today LVEF 25-30%, Grade 1 DD, Mild LAE, RV mildly reduced. Slightly improved from previous - Has not needed lasix.  Continue to use only as needed for weight gains of 3 lbs over night or 5 lbs in a weeks time.  - Continue Coreg 9.375 mg BID. Will not increase for now with bradycardia.  - Continue Spironolactone 25 mg daily.  BMET today.  - Increase Bidil to 2 tabs tid.  - Continue Entresto 49-51 mg bid.  - Continue Digoxin 0.125 daily. Level 0.3 at last visit.   2. ETOH Abuse: Former heavy drinker, he has quit.   3. Current Smoker: Continues to smoke 1ppd.  - Encouraged him to quit. Gave him an article on the effects of smoking on Erectile Dysfunction.  4. PAD: Mild claudication.  ABIs not markedly abnormal 01/2015.   - Continued to encourage him to quit smoking.   - Continue ASA 81 and Crestor 20 daily.  - Encouraged to increase activity as able.   5. Hyperlipidemia: Goal LDL < 70 with PAD - Continue Crestor 20 mg daily.   Lipids today.  5. CAD: Nonobstructive. No evidence of ischemia.   BMET and Lipids today. Med changes as above. Follow up 3 months.    Shirley Friar PA-C 02/18/2016   Patient seen with PA, agree with the above note.  Echo today showed EF 25-30%.  Today will increase Bidil to 2 tabs tid.  Encouraged him to quit smoking.  BMET/lipids today.   Followup 3 months.   [name] 02/19/2016

## 2016-02-18 NOTE — Progress Notes (Signed)
*  PRELIMINARY RESULTS* Echocardiogram 2D Echocardiogram has been performed.  Leavy Cella 02/18/2016, 10:48 AM

## 2016-02-20 ENCOUNTER — Other Ambulatory Visit (HOSPITAL_COMMUNITY): Payer: Self-pay | Admitting: *Deleted

## 2016-02-20 DIAGNOSIS — E785 Hyperlipidemia, unspecified: Secondary | ICD-10-CM

## 2016-02-20 MED ORDER — ROSUVASTATIN CALCIUM 40 MG PO TABS: 40.0000 mg | ORAL_TABLET | Freq: Every day | ORAL | Status: DC

## 2016-03-05 ENCOUNTER — Encounter: Payer: Self-pay | Admitting: Cardiology

## 2016-03-07 LAB — CUP PACEART REMOTE DEVICE CHECK
Battery Remaining Percentage: 94 %
Battery Voltage: 3.2 V
HIGH POWER IMPEDANCE MEASURED VALUE: 70 Ohm
HighPow Impedance: 70 Ohm
Implantable Lead Implant Date: 20161012
Implantable Lead Model: 7122
Lead Channel Impedance Value: 510 Ohm
Lead Channel Pacing Threshold Pulse Width: 0.5 ms
Lead Channel Setting Pacing Amplitude: 2.5 V
Lead Channel Setting Pacing Pulse Width: 0.5 ms
MDC IDC LEAD LOCATION: 753860
MDC IDC MSMT BATTERY REMAINING LONGEVITY: 97 mo
MDC IDC MSMT LEADCHNL RV PACING THRESHOLD AMPLITUDE: 0.75 V
MDC IDC MSMT LEADCHNL RV SENSING INTR AMPL: 12 mV
MDC IDC SESS DTM: 20170501152329
MDC IDC SET LEADCHNL RV SENSING SENSITIVITY: 0.5 mV
MDC IDC STAT BRADY RV PERCENT PACED: 1 %
Pulse Gen Serial Number: 7308640

## 2016-04-06 ENCOUNTER — Other Ambulatory Visit: Payer: Self-pay | Admitting: Internal Medicine

## 2016-04-23 ENCOUNTER — Ambulatory Visit (HOSPITAL_COMMUNITY)
Admission: RE | Admit: 2016-04-23 | Discharge: 2016-04-23 | Disposition: A | Discharge status: Home / Self Care | Payer: BLUE CROSS/BLUE SHIELD | Source: Ambulatory Visit | Attending: Cardiology | Admitting: Cardiology

## 2016-04-23 DIAGNOSIS — I5022 Chronic systolic (congestive) heart failure: Secondary | ICD-10-CM | POA: Diagnosis present

## 2016-04-23 LAB — LIPID PANEL
Cholesterol: 120 mg/dL (ref 0–200)
HDL: 34 mg/dL — ABNORMAL LOW (ref >40)
LDL CALC: 70 mg/dL (ref 0–99)
Total CHOL/HDL Ratio: 3.5 RATIO
Triglycerides: 78 mg/dL (ref <150)
VLDL: 16 mg/dL (ref 0–40)

## 2016-04-23 LAB — HEPATIC FUNCTION PANEL
ALBUMIN: 4 g/dL (ref 3.5–5.0)
ALK PHOS: 117 U/L (ref 38–126)
ALT: 19 U/L (ref 17–63)
AST: 20 U/L (ref 15–41)
BILIRUBIN INDIRECT: 0.6 mg/dL (ref 0.3–0.9)
Bilirubin, Direct: 0.2 mg/dL (ref 0.1–0.5)
TOTAL PROTEIN: 6.4 g/dL — AB (ref 6.5–8.1)
Total Bilirubin: 0.8 mg/dL (ref 0.3–1.2)

## 2016-04-26 ENCOUNTER — Telehealth: Payer: Self-pay | Admitting: *Deleted

## 2016-04-26 ENCOUNTER — Ambulatory Visit (INDEPENDENT_AMBULATORY_CARE_PROVIDER_SITE_OTHER): Payer: BLUE CROSS/BLUE SHIELD | Admitting: *Deleted

## 2016-04-26 DIAGNOSIS — I428 Other cardiomyopathies: Secondary | ICD-10-CM

## 2016-04-26 DIAGNOSIS — I429 Cardiomyopathy, unspecified: Secondary | ICD-10-CM

## 2016-04-26 NOTE — Telephone Encounter (Signed)
Done in error.

## 2016-04-26 NOTE — Progress Notes (Signed)
Remote ICD transmission.   

## 2016-05-04 ENCOUNTER — Encounter: Payer: Self-pay | Admitting: Cardiology

## 2016-05-06 ENCOUNTER — Other Ambulatory Visit (HOSPITAL_COMMUNITY): Payer: Self-pay | Admitting: Internal Medicine

## 2016-05-06 LAB — CUP PACEART REMOTE DEVICE CHECK
Battery Remaining Percentage: 92 %
Battery Voltage: 3.17 V
Brady Statistic RV Percent Paced: 1 %
Date Time Interrogation Session: 20170731075857
HIGH POWER IMPEDANCE MEASURED VALUE: 74 Ohm
HIGH POWER IMPEDANCE MEASURED VALUE: 74 Ohm
Implantable Lead Implant Date: 20161012
Implantable Lead Location: 753860
Implantable Lead Model: 7122
Lead Channel Impedance Value: 540 Ohm
Lead Channel Pacing Threshold Amplitude: 0.75 V
Lead Channel Pacing Threshold Pulse Width: 0.5 ms
Lead Channel Sensing Intrinsic Amplitude: 12 mV
Lead Channel Setting Pacing Pulse Width: 0.5 ms
MDC IDC MSMT BATTERY REMAINING LONGEVITY: 96 mo
MDC IDC PG SERIAL: 7308640
MDC IDC SET LEADCHNL RV PACING AMPLITUDE: 2.5 V
MDC IDC SET LEADCHNL RV SENSING SENSITIVITY: 0.5 mV

## 2016-05-10 ENCOUNTER — Other Ambulatory Visit (HOSPITAL_COMMUNITY): Payer: Self-pay | Admitting: *Deleted

## 2016-05-10 DIAGNOSIS — I5022 Chronic systolic (congestive) heart failure: Secondary | ICD-10-CM

## 2016-05-10 MED ORDER — SPIRONOLACTONE 25 MG PO TABS: 25.0000 mg | ORAL_TABLET | Freq: Every day | ORAL | 3 refills | Status: DC

## 2016-05-21 ENCOUNTER — Ambulatory Visit (HOSPITAL_COMMUNITY)
Admission: RE | Admit: 2016-05-21 | Discharge: 2016-05-21 | Disposition: A | Discharge status: Home / Self Care | Payer: BLUE CROSS/BLUE SHIELD | Source: Ambulatory Visit | Attending: Cardiology | Admitting: Cardiology

## 2016-05-21 ENCOUNTER — Encounter (HOSPITAL_COMMUNITY): Payer: Self-pay

## 2016-05-21 VITALS — BP 108/54 | HR 80 | Wt 153.8 lb

## 2016-05-21 DIAGNOSIS — I110 Hypertensive heart disease with heart failure: Secondary | ICD-10-CM | POA: Insufficient documentation

## 2016-05-21 DIAGNOSIS — Z8249 Family history of ischemic heart disease and other diseases of the circulatory system: Secondary | ICD-10-CM | POA: Insufficient documentation

## 2016-05-21 DIAGNOSIS — F1010 Alcohol abuse, uncomplicated: Secondary | ICD-10-CM | POA: Insufficient documentation

## 2016-05-21 DIAGNOSIS — I2510 Atherosclerotic heart disease of native coronary artery without angina pectoris: Secondary | ICD-10-CM | POA: Insufficient documentation

## 2016-05-21 DIAGNOSIS — Z7982 Long term (current) use of aspirin: Secondary | ICD-10-CM | POA: Insufficient documentation

## 2016-05-21 DIAGNOSIS — I739 Peripheral vascular disease, unspecified: Secondary | ICD-10-CM | POA: Diagnosis not present

## 2016-05-21 DIAGNOSIS — Z79899 Other long term (current) drug therapy: Secondary | ICD-10-CM | POA: Diagnosis not present

## 2016-05-21 DIAGNOSIS — Z823 Family history of stroke: Secondary | ICD-10-CM | POA: Diagnosis not present

## 2016-05-21 DIAGNOSIS — I5022 Chronic systolic (congestive) heart failure: Secondary | ICD-10-CM | POA: Diagnosis not present

## 2016-05-21 DIAGNOSIS — I428 Other cardiomyopathies: Secondary | ICD-10-CM | POA: Insufficient documentation

## 2016-05-21 DIAGNOSIS — I429 Cardiomyopathy, unspecified: Secondary | ICD-10-CM

## 2016-05-21 DIAGNOSIS — E785 Hyperlipidemia, unspecified: Secondary | ICD-10-CM | POA: Diagnosis not present

## 2016-05-21 DIAGNOSIS — F17210 Nicotine dependence, cigarettes, uncomplicated: Secondary | ICD-10-CM | POA: Insufficient documentation

## 2016-05-21 LAB — BASIC METABOLIC PANEL
ANION GAP: 8 (ref 5–15)
BUN: 8 mg/dL (ref 6–20)
CALCIUM: 9.9 mg/dL (ref 8.9–10.3)
CO2: 24 mmol/L (ref 22–32)
Chloride: 106 mmol/L (ref 101–111)
Creatinine, Ser: 0.88 mg/dL (ref 0.61–1.24)
GFR calc Af Amer: >60 mL/min (ref >60)
GLUCOSE: 103 mg/dL — AB (ref 65–99)
Potassium: 4.2 mmol/L (ref 3.5–5.1)
Sodium: 138 mmol/L (ref 135–145)

## 2016-05-21 LAB — DIGOXIN LEVEL: DIGOXIN LVL: 0.2 ng/mL — AB (ref 0.8–2.0)

## 2016-05-21 MED ORDER — CARVEDILOL 12.5 MG PO TABS: 12.5000 mg | ORAL_TABLET | Freq: Two times a day (BID) | ORAL | 6 refills | Status: DC

## 2016-05-21 NOTE — Patient Instructions (Signed)
Take Crestor 40 mg DAILY, you can pick this up on Wed 8/30  Increase Carvedilol to 12.5 mg Twice daily   Labs today  We will contact you in 3 months to schedule your next appointment.

## 2016-05-22 NOTE — Progress Notes (Signed)
Patient ID: [name] [name], male   DOB: [birth year], 60 y.o.   MRN: [id number]    Advanced Heart Failure Clinic Note   PCP: Dr Doreene Burke Primary Cardiologist: Dr Aundra Dubin PARAMEDICINE Patient.   HPI: [name] [name] is a 60 year old with history of ETOH was found to have EF 10% by echo. He had exertional dyspnea and chest pain, taken for [facility] 5/16 showing nonobstructive CAD, elevated filling pressures (but not marked elevation), and low cardiac index. He was admitted to optimize HF. He was diuresed with IV lasix and transitioned to po lasix. Overall he diuresed 9 pounds. He continued on low dose carvedilol, lisinopril, bidil, and spironolactone.   [name] [name] returns today for regular follow up. He has been doing well.  Weight is down 3 lbs.  He is not drinking any ETOH but still smokes.  No exertional dyspnea.  He is doing some Architect work.  No lightheadedness, no chset pain.  No orthopnea/PND.    CPX in 2/17 was limited by hip pain => submaximal.   Labs (5/16): K 3.8, creatinine 0.94 => 1.19, HCT 48.4, LDL 121, HDL 50, digoxin 0.3, BNP 2343 => 367 Labs (6/16): K 4, creatinine 1.06 Labs (04/18/15): K 4.3 Creatinine 0.91 Labs (05/07/2015): K 4.5 Creatinine 0.88  Labs (10/16): K 4.6, creatinine 0.95, digoxin 0.5, TC 163 Labs (12/16) K 4.2, creatinine 0.90 Labs (2/17): K 3.8, creatinine 0.92 Labs (5/17): K 4, creatinine 0.92, digoxin 0.3 Labs (7/17): LFTs normal, LDL 70, HDL 34  PMH: 1. Low back pain 2. HTN 3. Hyperlipidemia 4. H/o cocaine abuse: Not active. 5. ETOH abuse: 1 pint/day 6. CAD: Coronary CT angiogram in 9/09 showed possible significant obstructive disease in the circumflex. He did not have cardiac cath.  7. Cardiomyopathy: Echo (9/09) with EF 35-40%. Echo (5/16) with EF 10%, mild LV dilation, grade II diastolic dysfunction, mildly dilated RV with moderately decreased systolic function. LHC/RHC (5/16): Nonobstructive coronary disease, mean RA 10, PA 49/28, mean  PCWP 25, CI 1.4.  Echo (9/16) with EF 20-25%, severe LV dilation. St Jude ICD.  - CPX 11/04/15: FVC 2.85 FEV1 1.98, MVV 89, Peak RER 0.94 (submaximal), peak VO2 13 (41.2% predicted), [id number] slope: 26.  This study was limited by hip pain.  - Echo 02/18/16 LVEF 25-30%, Grade 1 DD, Mild LAE, RV mildly reduced.  8. Active smoker 9. PAD: ABI L normal, R mildly decreased.   SH: Lives in [place], works as Dealer, lives with son, smokes 1 ppd, drank 1 pint/day liquor => quit 5/16, prior cocaine use.   FH: Brother with MI in his 58s, another brother with ?SCD, mother with CVA.   ROS: All systems reviewed and negative except as per HPI.   Current Outpatient Prescriptions  Medication Sig Dispense Refill  . aspirin 81 MG EC tablet Take 1 tablet (81 mg total) by mouth daily. Swallow whole. 30 tablet 6  . carvedilol (COREG) 12.5 MG tablet Take 1 tablet (12.5 mg total) by mouth 2 (two) times daily with a meal. 60 tablet 6  . digoxin (LANOXIN) 0.125 MG tablet Take 1 tablet (125 mcg total) by mouth daily. 30 tablet 6  . isosorbide-hydrALAZINE (BIDIL) 20-37.5 MG tablet Take 2 tablets by mouth 3 (three) times daily. 180 tablet 5  . rosuvastatin (CRESTOR) 40 MG tablet Take 1 tablet (40 mg total) by mouth daily. 3 tablet 3  . sacubitril-valsartan (ENTRESTO) 49-51 MG Take 1 tablet by mouth 2 (two) times daily. 60 tablet 6  . spironolactone (ALDACTONE) 25  MG tablet Take 1 tablet (25 mg total) by mouth daily. 30 tablet 3   No current facility-administered medications for this encounter.     Vitals:   05/21/16 1002  BP: (!) 108/54  Pulse: 80  SpO2: 100%  Weight: 153 lb 12 oz (69.7 kg)    Wt Readings from Last 3 Encounters:  05/21/16 153 lb 12 oz (69.7 kg)  02/18/16 156 lb 8 oz (71 kg)  12/19/15 156 lb 8 oz (71 kg)     PHYSICAL EXAM: General:  Thin appearing. No resp difficulty. Ambulated into clinic without difficulty HEENT: normal Neck: supple. JVP flat. Carotids 2+ bilaterally; no bruits. No  thyromegaly or lymphadenopathy appreciated. Cor: PMI laterally displaced. RRR, No M/G/R Unable to palpate pedal pulses.  Lungs: Clear Abdomen: soft, non-tender, non-distended, no HSM. No bruits or masses. +BS  Extremities: no cyanosis, clubbing, rash.  No edema.  Neuro: alert & orientedx3, cranial nerves grossly intact. Moves all 4 extremities w/o difficulty. Affect pleasant.  ASSESSMENT & PLAN: 1. Chronic Systolic HF: Nonischemic cardiomyopathy with low output per RHC/LHC in 5/16.  Echo (9/16) with EF 20-25%, severe LV dilation s/p St Jude ICD.  Possible ETOH cardiomyopathy. NYHA class II symptoms, seems to be doing better overall.  Limited literacy.  Volume status stable on exam.  CPX was submaximal due to hip pain so hard to interpret. Echo in 5/17 with LVEF 25-30%, Grade 1 DD, Mild LAE, RV mildly reduced. Slightly improved from previous - Has not needed lasix.  Continue to use only as needed for weight gains of 3 lbs over night or 5 lbs in a weeks time.  - Increase Coreg to 12.5 mg bid.  - Continue Spironolactone 25 mg daily.  BMET today.  - Continue Bidil 2 tabs tid.  - Continue Entresto 49-51 mg bid.  - Continue Digoxin 0.125 daily. Check level today.   2. ETOH Abuse: Former heavy drinker, he has quit.   3. Current Smoker: Continues to smoke 1 ppd. I strongly encouraged him to quit.  4. PAD: Mild claudication.  ABIs not markedly abnormal 01/2015.   - Continued to encourage him to quit smoking.   - Continue ASA 81 and Crestor 40 daily.  - Encouraged to increase activity as able.   5. Hyperlipidemia: Goal LDL < 70 with PAD - Continue Crestor 240 mg daily.   5. CAD: Nonobstructive. No evidence of ischemia.   Followup in 3 months.   [name] 05/22/2016

## 2016-06-22 ENCOUNTER — Other Ambulatory Visit (HOSPITAL_COMMUNITY): Payer: Self-pay | Admitting: *Deleted

## 2016-06-22 DIAGNOSIS — E785 Hyperlipidemia, unspecified: Secondary | ICD-10-CM

## 2016-06-22 MED ORDER — ROSUVASTATIN CALCIUM 40 MG PO TABS: 40.0000 mg | ORAL_TABLET | Freq: Every day | ORAL | 3 refills | Status: DC

## 2016-06-28 ENCOUNTER — Other Ambulatory Visit (HOSPITAL_COMMUNITY): Payer: Self-pay | Admitting: *Deleted

## 2016-06-28 MED ORDER — ISOSORB DINITRATE-HYDRALAZINE 20-37.5 MG PO TABS: 2.0000 | ORAL_TABLET | Freq: Three times a day (TID) | ORAL | 5 refills | Status: DC

## 2016-07-01 ENCOUNTER — Other Ambulatory Visit (HOSPITAL_COMMUNITY): Payer: Self-pay | Admitting: *Deleted

## 2016-07-01 DIAGNOSIS — I428 Other cardiomyopathies: Secondary | ICD-10-CM

## 2016-07-01 MED ORDER — SACUBITRIL-VALSARTAN 49-51 MG PO TABS: 1.0000 | ORAL_TABLET | Freq: Two times a day (BID) | ORAL | 6 refills | Status: DC

## 2016-07-12 ENCOUNTER — Other Ambulatory Visit (HOSPITAL_COMMUNITY): Payer: Self-pay | Admitting: *Deleted

## 2016-07-12 DIAGNOSIS — I428 Other cardiomyopathies: Secondary | ICD-10-CM

## 2016-07-12 MED ORDER — ASPIRIN 81 MG PO TBEC: 81.0000 mg | DELAYED_RELEASE_TABLET | Freq: Every day | ORAL | 6 refills | Status: DC

## 2016-07-12 MED ORDER — DIGOXIN 125 MCG PO TABS: 125.0000 ug | ORAL_TABLET | Freq: Every day | ORAL | 6 refills | Status: DC

## 2016-09-28 ENCOUNTER — Other Ambulatory Visit (HOSPITAL_COMMUNITY): Payer: Self-pay | Admitting: *Deleted

## 2016-09-28 DIAGNOSIS — I5022 Chronic systolic (congestive) heart failure: Secondary | ICD-10-CM

## 2016-09-28 MED ORDER — SPIRONOLACTONE 25 MG PO TABS: 25.0000 mg | ORAL_TABLET | Freq: Every day | ORAL | 3 refills | Status: DC

## 2016-10-12 ENCOUNTER — Telehealth (HOSPITAL_COMMUNITY): Payer: Self-pay | Admitting: Vascular Surgery

## 2016-10-12 ENCOUNTER — Ambulatory Visit (HOSPITAL_COMMUNITY)
Admission: RE | Admit: 2016-10-12 | Discharge: 2016-10-12 | Disposition: A | Discharge status: Home / Self Care | Payer: BLUE CROSS/BLUE SHIELD | Source: Ambulatory Visit | Attending: Cardiology | Admitting: Cardiology

## 2016-10-12 ENCOUNTER — Encounter (HOSPITAL_COMMUNITY): Payer: Self-pay

## 2016-10-12 VITALS — BP 101/61 | HR 56 | Wt 156.8 lb

## 2016-10-12 DIAGNOSIS — I2510 Atherosclerotic heart disease of native coronary artery without angina pectoris: Secondary | ICD-10-CM | POA: Diagnosis not present

## 2016-10-12 DIAGNOSIS — E785 Hyperlipidemia, unspecified: Secondary | ICD-10-CM | POA: Diagnosis not present

## 2016-10-12 DIAGNOSIS — Z7982 Long term (current) use of aspirin: Secondary | ICD-10-CM | POA: Insufficient documentation

## 2016-10-12 DIAGNOSIS — F17210 Nicotine dependence, cigarettes, uncomplicated: Secondary | ICD-10-CM | POA: Diagnosis not present

## 2016-10-12 DIAGNOSIS — I110 Hypertensive heart disease with heart failure: Secondary | ICD-10-CM | POA: Diagnosis not present

## 2016-10-12 DIAGNOSIS — I428 Other cardiomyopathies: Secondary | ICD-10-CM | POA: Insufficient documentation

## 2016-10-12 DIAGNOSIS — I5022 Chronic systolic (congestive) heart failure: Secondary | ICD-10-CM | POA: Insufficient documentation

## 2016-10-12 DIAGNOSIS — I739 Peripheral vascular disease, unspecified: Secondary | ICD-10-CM | POA: Diagnosis not present

## 2016-10-12 DIAGNOSIS — Z79899 Other long term (current) drug therapy: Secondary | ICD-10-CM | POA: Diagnosis not present

## 2016-10-12 LAB — BASIC METABOLIC PANEL
Anion gap: 6 (ref 5–15)
BUN: 13 mg/dL (ref 6–20)
CHLORIDE: 105 mmol/L (ref 101–111)
CO2: 27 mmol/L (ref 22–32)
CREATININE: 0.88 mg/dL (ref 0.61–1.24)
Calcium: 9.7 mg/dL (ref 8.9–10.3)
GFR calc Af Amer: >60 mL/min (ref >60)
GFR calc non Af Amer: >60 mL/min (ref >60)
Glucose, Bld: 118 mg/dL — ABNORMAL HIGH (ref 65–99)
POTASSIUM: 3.8 mmol/L (ref 3.5–5.1)
SODIUM: 138 mmol/L (ref 135–145)

## 2016-10-12 LAB — DIGOXIN LEVEL: Digoxin Level: 0.3 ng/mL — ABNORMAL LOW (ref 0.8–2.0)

## 2016-10-12 LAB — BRAIN NATRIURETIC PEPTIDE: B NATRIURETIC PEPTIDE 5: 45.2 pg/mL (ref 0.0–100.0)

## 2016-10-12 NOTE — Telephone Encounter (Signed)
[place] Message to call pt to schedule f/u appt

## 2016-10-12 NOTE — Patient Instructions (Signed)
Routine lab work today. Will notify you of abnormal results  Follow up and echo with Dr.McLean in 4 months

## 2016-10-13 NOTE — Progress Notes (Signed)
Patient ID: [name] [name], male   DOB: Dec 31, 1955, 61 y.o.   MRN: [id number]    Advanced Heart Failure Clinic Note   PCP: Dr Doreene Burke Primary Cardiologist: Dr Aundra Dubin  HPI: [name] [name] is a 61 year old with history of ETOH was found to have EF 10% by echo. He had exertional dyspnea and chest pain, taken for [facility] 5/16 showing nonobstructive CAD, elevated filling pressures (but not marked elevation), and low cardiac index. He was admitted to optimize HF. He was diuresed with IV lasix and transitioned to po lasix. Overall he diuresed 9 pounds. He continued on low dose carvedilol, lisinopril, bidil, and spironolactone.   [name] [name] returns today for regular follow up. He has been doing well.  He is taking all his meds. He is not drinking any ETOH but still smokes.  No exertional dyspnea.  No lightheadedness, no chest pain.  No orthopnea/PND.  Minimal claudication.   CPX in 2/17 was limited by hip pain => submaximal.   Labs (5/16): K 3.8, creatinine 0.94 => 1.19, HCT 48.4, LDL 121, HDL 50, digoxin 0.3, BNP 2343 => 367 Labs (6/16): K 4, creatinine 1.06 Labs (04/18/15): K 4.3 Creatinine 0.91 Labs (05/07/2015): K 4.5 Creatinine 0.88  Labs (10/16): K 4.6, creatinine 0.95, digoxin 0.5, TC 163 Labs (12/16) K 4.2, creatinine 0.90 Labs (2/17): K 3.8, creatinine 0.92 Labs (5/17): K 4, creatinine 0.92, digoxin 0.3 Labs (7/17): LFTs normal, LDL 70, HDL 34 Labs (8/17): digoxin 0.2, K 4.2, creatinine 0.88  PMH: 1. Low back pain 2. HTN 3. Hyperlipidemia 4. H/o cocaine abuse: Not active. 5. ETOH abuse: 1 pint/day 6. CAD: Coronary CT angiogram in 9/09 showed possible significant obstructive disease in the circumflex. He did not have cardiac cath.  7. Cardiomyopathy: Echo (9/09) with EF 35-40%. Echo (5/16) with EF 10%, mild LV dilation, grade II diastolic dysfunction, mildly dilated RV with moderately decreased systolic function. LHC/RHC (5/16): Nonobstructive coronary disease, mean RA 10, PA  49/28, mean PCWP 25, CI 1.4.  Echo (9/16) with EF 20-25%, severe LV dilation. St Jude ICD.  - CPX 11/04/15: FVC 2.85 FEV1 1.98, MVV 89, Peak RER 0.94 (submaximal), peak VO2 13 (41.2% predicted), [id number] slope: 26.  This study was limited by hip pain.  - Echo 02/18/16 LVEF 25-30%, Grade 1 DD, Mild LAE, RV mildly reduced.  8. Active smoker 9. PAD: ABI L normal, R mildly decreased.   SH: Lives in [place], works as Dealer, lives with son, smokes 1 ppd, drank 1 pint/day liquor => quit 5/16, prior cocaine use.   FH: Brother with MI in his 62s, another brother with ?SCD, mother with CVA.   ROS: All systems reviewed and negative except as per HPI.   Current Outpatient Prescriptions  Medication Sig Dispense Refill  . aspirin 81 MG EC tablet Take 1 tablet (81 mg total) by mouth daily. Swallow whole. 30 tablet 6  . carvedilol (COREG) 12.5 MG tablet Take 1 tablet (12.5 mg total) by mouth 2 (two) times daily with a meal. 60 tablet 6  . digoxin (LANOXIN) 0.125 MG tablet Take 1 tablet (125 mcg total) by mouth daily. 30 tablet 6  . isosorbide-hydrALAZINE (BIDIL) 20-37.5 MG tablet Take 2 tablets by mouth 3 (three) times daily. 180 tablet 5  . rosuvastatin (CRESTOR) 40 MG tablet Take 1 tablet (40 mg total) by mouth daily. 30 tablet 3  . sacubitril-valsartan (ENTRESTO) 49-51 MG Take 1 tablet by mouth 2 (two) times daily. 60 tablet 6  . spironolactone (ALDACTONE) 25 MG  tablet Take 1 tablet (25 mg total) by mouth daily. 30 tablet 3   No current facility-administered medications for this encounter.     Vitals:   10/12/16 1054  BP: 101/61  Pulse: (!) 56  SpO2: 100%  Weight: 156 lb 12 oz (71.1 kg)    Wt Readings from Last 3 Encounters:  10/12/16 156 lb 12 oz (71.1 kg)  05/21/16 153 lb 12 oz (69.7 kg)  02/18/16 156 lb 8 oz (71 kg)     PHYSICAL EXAM: General:  Thin appearing. No resp difficulty. Ambulated into clinic without difficulty HEENT: normal Neck: supple. JVP flat. Carotids 2+ bilaterally;  no bruits. No thyromegaly or lymphadenopathy appreciated. Cor: PMI laterally displaced. RRR, No M/G/R Unable to palpate pedal pulses.  Lungs: Clear Abdomen: soft, non-tender, non-distended, no HSM. No bruits or masses. +BS  Extremities: no cyanosis, clubbing, rash.  No edema.  Neuro: alert & orientedx3, cranial nerves grossly intact. Moves all 4 extremities w/o difficulty. Affect pleasant.  ASSESSMENT & PLAN: 1. Chronic Systolic HF: Nonischemic cardiomyopathy with low output per RHC/LHC in 5/16.  Echo (9/16) with EF 20-25%, severe LV dilation s/p St Jude ICD.  Possible ETOH cardiomyopathy. NYHA class II symptoms, seems to be doing better overall.  Limited literacy.  Volume status stable on exam.  CPX (2/17) was submaximal due to hip pain so hard to interpret. Echo in 5/17 with LVEF 25-30%, Grade 1 DD, Mild LAE, RV mildly reduced. Slightly improved from previous.   - Has not needed lasix.  Continue to use only as needed for weight gains of 3 lbs over night or 5 lbs in a weeks time.  - Continue Coreg 12.5 mg bid, does not appear to have HR or BP room to increase. - Continue Spironolactone 25 mg daily.  BMET today.  - Continue Bidil 2 tabs tid.  - Continue Entresto 49-51 mg bid, no BP room to increase.  - Continue Digoxin 0.125 daily. Check level today.   - I will arrange for echo at time of followup in 4 months.  - I will arrange for device clinic followup for his ICD.  2. ETOH Abuse: Former heavy drinker, he has quit.   3. Current Smoker: Continues to smoke 1 ppd. I strongly encouraged him to quit.  4. PAD: Mild claudication.   - Continued to encourage him to quit smoking.   - Continue ASA 81 and Crestor 40 daily.  - Encouraged to increase activity as able.   - Repeat ABIs.  5. Hyperlipidemia: Goal LDL < 70 with PAD - Continue Crestor 40 mg daily.   5. CAD: Nonobstructive. No evidence of ischemia.   Followup in 4 months.   [name] [name] 10/13/2016

## 2016-10-28 ENCOUNTER — Ambulatory Visit (INDEPENDENT_AMBULATORY_CARE_PROVIDER_SITE_OTHER): Payer: BLUE CROSS/BLUE SHIELD | Admitting: Internal Medicine

## 2016-10-28 ENCOUNTER — Encounter: Payer: Self-pay | Admitting: Internal Medicine

## 2016-10-28 VITALS — BP 112/60 | HR 54 | Ht 73.0 in | Wt 155.0 lb

## 2016-10-28 DIAGNOSIS — Z95810 Presence of automatic (implantable) cardiac defibrillator: Secondary | ICD-10-CM | POA: Diagnosis not present

## 2016-10-28 DIAGNOSIS — I5022 Chronic systolic (congestive) heart failure: Secondary | ICD-10-CM

## 2016-10-28 DIAGNOSIS — I428 Other cardiomyopathies: Secondary | ICD-10-CM

## 2016-10-28 LAB — CUP PACEART INCLINIC DEVICE CHECK
HighPow Impedance: 71 Ohm
Implantable Lead Implant Date: 20161012
Implantable Pulse Generator Implant Date: 20161012
Lead Channel Impedance Value: 550 Ohm
Lead Channel Sensing Intrinsic Amplitude: 12 mV
Lead Channel Setting Pacing Pulse Width: 0.5 ms
Lead Channel Setting Sensing Sensitivity: 0.5 mV
MDC IDC LEAD LOCATION: 753860
MDC IDC MSMT LEADCHNL RV PACING THRESHOLD AMPLITUDE: 0.75 V
MDC IDC MSMT LEADCHNL RV PACING THRESHOLD PULSEWIDTH: 0.5 ms
MDC IDC SESS DTM: 20180201161241
MDC IDC SET LEADCHNL RV PACING AMPLITUDE: 2.5 V
MDC IDC STAT BRADY RV PERCENT PACED: 0.29 %
Pulse Gen Serial Number: 7308640

## 2016-10-28 NOTE — Progress Notes (Signed)
Patient Care Team: Tresa Garter, MD as PCP - General (Internal Medicine)   HPI  [name] [name] is a 61 y.o. male Seen in followup for ICD implanted for NICM for primary prevention   Date        Dig    1/18    Cr  0.88      K   3.8         0.3   /    Cr        K       The patient denies chest pain, shortness of breath, nocturnal dyspnea, orthopnea or peripheral edema.  There have been no palpitations, lightheadedness or syncope.   Was recently seen by DM-CHF    Lightheadedness is better antegrade flow from  Seen in CHF clinic reviewed stable  Labs ok  Past Medical History:  Diagnosis Date  . AICD (automatic cardioverter/defibrillator) present   . Chronic systolic CHF (congestive heart failure) ([place]) 01/31/2015  . Coronary artery disease   . Hypertension   . Nonischemic cardiomyopathy ([place]) 06/30/2015    Past Surgical History:  Procedure Laterality Date  . CARDIAC CATHETERIZATION N/A 02/04/2015   Procedure: Right/Left Heart Cath and Coronary Angiography;  Surgeon: Larey Dresser, MD;  Location: [place] [facility];  Service: Cardiovascular;  Laterality: N/A;  . CARDIAC DEFIBRILLATOR PLACEMENT  07/09/2015  . CHALAZION EXCISION Left 01/16/2014   Procedure: MINOR EXCISION OF CHALAZION;  Surgeon: Myrtha Mantis., MD;  Location: [place];  Service: Ophthalmology;  Laterality: Left;  . EP IMPLANTABLE DEVICE N/A 07/09/2015   Procedure: ICD Implant;  Surgeon: Deboraha Sprang, MD;  Location: [facility];  Service: Cardiovascular;  Laterality: N/A;  . EYE SURGERY Left ~ 2013   "eye infection"    Current Outpatient Prescriptions  Medication Sig Dispense Refill  . aspirin 81 MG EC tablet Take 1 tablet (81 mg total) by mouth daily. Swallow whole. 30 tablet 6  . carvedilol (COREG) 12.5 MG tablet Take 1 tablet (12.5 mg total) by mouth 2 (two) times daily with a meal. 60 tablet 6  . digoxin (LANOXIN) 0.125 MG tablet Take 1 tablet (125 mcg  total) by mouth daily. 30 tablet 6  . isosorbide-hydrALAZINE (BIDIL) 20-37.5 MG tablet Take 2 tablets by mouth 3 (three) times daily. 180 tablet 5  . rosuvastatin (CRESTOR) 40 MG tablet Take 1 tablet (40 mg total) by mouth daily. 30 tablet 3  . sacubitril-valsartan (ENTRESTO) 49-51 MG Take 1 tablet by mouth 2 (two) times daily. 60 tablet 6  . spironolactone (ALDACTONE) 25 MG tablet Take 1 tablet (25 mg total) by mouth daily. 30 tablet 3   No current facility-administered medications for this visit.     No Known Allergies    Review of Systems negative except from HPI and PMH  Physical Exam BP 112/60   Pulse (!) 54   Ht 6\' 1"  (1.854 m)   Wt 155 lb (70.3 kg)   SpO2 96%   BMI 20.45 kg/m   Well developed and well nourished in no acute distress HENT normal E scleral and icterus clear Neck Supple JVP flat; carotids brisk and full Clear to ausculation Device pocket well healed; without hematoma or erythema.  There is no tethering  *Regular rate and rhythm, no murmurs gallops or rub Soft with active bowel sounds No clubbing cyanosis  Edema restericted motion of the L arm but beter Alert and oriented, grossly normal motor  and sensory function Skin Warm and Dry  ECG demonstrates sinus rhythm at 58 Intervals 19/10/45 Axis left -41 ST-T changes  Assessment and  Plan  Nonischemic cardiomyopathy  Implantable defibrillator-St. Jude  Congestive heart failure-chronic-systolic  Adhesive capsulitis   Adhesive capsulitis is better  Device function was reprogrammed from maximum longevity.  Euvolemic continue current meds

## 2016-10-28 NOTE — Patient Instructions (Addendum)
Medication Instructions:    Your physician recommends that you continue on your current medications as directed. Please refer to the Current Medication list given to you today.  --- If you need a refill on your cardiac medications before your next appointment, please call your pharmacy. ---  Labwork:  None ordered  Testing/Procedures:  None ordered  Follow-Up: Remote monitoring is used to monitor your Pacemaker of ICD from home. This monitoring reduces the number of office visits required to check your device to one time per year. It allows [place] to keep an eye on the functioning of your device to ensure it is working properly. You are scheduled for a device check from home on 01/27/2017. You may send your transmission at any time that day. If you have a wireless device, the transmission will be sent automatically. After your physician reviews your transmission, you will receive a postcard with your next transmission date.   Your physician wants you to follow-up in: 1 year with Chanetta Onken, NP.  You will receive a reminder letter in the mail two months in advance. If you don't receive a letter, please call our office to schedule the follow-up appointment.  Thank you for choosing [facility]!!   Trinidad Curet, RN [phone]

## 2016-11-15 ENCOUNTER — Other Ambulatory Visit (HOSPITAL_COMMUNITY): Payer: Self-pay | Admitting: Cardiology

## 2016-11-15 DIAGNOSIS — E785 Hyperlipidemia, unspecified: Secondary | ICD-10-CM

## 2016-11-15 MED ORDER — ROSUVASTATIN CALCIUM 40 MG PO TABS: 40.0000 mg | ORAL_TABLET | Freq: Every day | ORAL | 3 refills | Status: DC

## 2016-12-15 ENCOUNTER — Other Ambulatory Visit (HOSPITAL_COMMUNITY): Payer: Self-pay | Admitting: Cardiology

## 2016-12-15 MED ORDER — CARVEDILOL 12.5 MG PO TABS: 12.5000 mg | ORAL_TABLET | Freq: Two times a day (BID) | ORAL | 3 refills | Status: DC

## 2017-01-27 ENCOUNTER — Ambulatory Visit (INDEPENDENT_AMBULATORY_CARE_PROVIDER_SITE_OTHER): Payer: BLUE CROSS/BLUE SHIELD | Admitting: *Deleted

## 2017-01-27 DIAGNOSIS — I428 Other cardiomyopathies: Secondary | ICD-10-CM

## 2017-01-27 NOTE — Progress Notes (Signed)
Remote ICD transmission.   

## 2017-01-28 LAB — CUP PACEART REMOTE DEVICE CHECK
Battery Remaining Longevity: 90 mo
Battery Voltage: 3.05 V
Date Time Interrogation Session: 20180503093650
HIGH POWER IMPEDANCE MEASURED VALUE: 75 Ohm
HIGH POWER IMPEDANCE MEASURED VALUE: 75 Ohm
Implantable Lead Model: 7122
Implantable Pulse Generator Implant Date: 20161012
Lead Channel Sensing Intrinsic Amplitude: 12 mV
Lead Channel Setting Pacing Amplitude: 2.5 V
MDC IDC LEAD IMPLANT DT: 20161012
MDC IDC LEAD LOCATION: 753860
MDC IDC MSMT BATTERY REMAINING PERCENTAGE: 86 %
MDC IDC MSMT LEADCHNL RV IMPEDANCE VALUE: 540 Ohm
MDC IDC MSMT LEADCHNL RV PACING THRESHOLD AMPLITUDE: 0.75 V
MDC IDC MSMT LEADCHNL RV PACING THRESHOLD PULSEWIDTH: 0.5 ms
MDC IDC PG SERIAL: 7308640
MDC IDC SET LEADCHNL RV PACING PULSEWIDTH: 0.5 ms
MDC IDC SET LEADCHNL RV SENSING SENSITIVITY: 0.5 mV
MDC IDC STAT BRADY RV PERCENT PACED: 1 %

## 2017-02-04 ENCOUNTER — Encounter: Payer: Self-pay | Admitting: Cardiology

## 2017-02-28 ENCOUNTER — Other Ambulatory Visit (HOSPITAL_COMMUNITY): Payer: Self-pay | Admitting: Cardiology

## 2017-02-28 DIAGNOSIS — I5022 Chronic systolic (congestive) heart failure: Secondary | ICD-10-CM

## 2017-02-28 MED ORDER — SPIRONOLACTONE 25 MG PO TABS: 25.0000 mg | ORAL_TABLET | Freq: Every day | ORAL | 3 refills | Status: DC

## 2017-04-06 ENCOUNTER — Other Ambulatory Visit (HOSPITAL_COMMUNITY): Payer: Self-pay | Admitting: Cardiology

## 2017-04-06 DIAGNOSIS — I428 Other cardiomyopathies: Secondary | ICD-10-CM

## 2017-04-06 MED ORDER — DIGOXIN 125 MCG PO TABS: 125.0000 ug | ORAL_TABLET | Freq: Every day | ORAL | 6 refills | Status: DC

## 2017-04-11 ENCOUNTER — Other Ambulatory Visit (HOSPITAL_COMMUNITY): Payer: Self-pay | Admitting: Internal Medicine

## 2017-04-11 DIAGNOSIS — I428 Other cardiomyopathies: Secondary | ICD-10-CM

## 2017-04-12 ENCOUNTER — Other Ambulatory Visit (HOSPITAL_COMMUNITY): Payer: Self-pay | Admitting: *Deleted

## 2017-04-12 DIAGNOSIS — E785 Hyperlipidemia, unspecified: Secondary | ICD-10-CM

## 2017-04-12 MED ORDER — ROSUVASTATIN CALCIUM 40 MG PO TABS: 40.0000 mg | ORAL_TABLET | Freq: Every day | ORAL | 3 refills | Status: DC

## 2017-04-13 ENCOUNTER — Ambulatory Visit (HOSPITAL_BASED_OUTPATIENT_CLINIC_OR_DEPARTMENT_OTHER)
Admission: RE | Admit: 2017-04-13 | Discharge: 2017-04-13 | Disposition: A | Discharge status: Home / Self Care | Payer: BLUE CROSS/BLUE SHIELD | Source: Ambulatory Visit | Attending: Cardiology | Admitting: Cardiology

## 2017-04-13 ENCOUNTER — Ambulatory Visit (HOSPITAL_COMMUNITY)
Admission: RE | Admit: 2017-04-13 | Discharge: 2017-04-13 | Disposition: A | Discharge status: Home / Self Care | Payer: BLUE CROSS/BLUE SHIELD | Source: Ambulatory Visit | Attending: Cardiology | Admitting: Cardiology

## 2017-04-13 ENCOUNTER — Encounter (HOSPITAL_COMMUNITY): Payer: Self-pay | Admitting: Cardiology

## 2017-04-13 VITALS — BP 129/75 | HR 63 | Wt 151.0 lb

## 2017-04-13 DIAGNOSIS — E785 Hyperlipidemia, unspecified: Secondary | ICD-10-CM | POA: Diagnosis not present

## 2017-04-13 DIAGNOSIS — I429 Cardiomyopathy, unspecified: Secondary | ICD-10-CM | POA: Insufficient documentation

## 2017-04-13 DIAGNOSIS — F1010 Alcohol abuse, uncomplicated: Secondary | ICD-10-CM | POA: Diagnosis not present

## 2017-04-13 DIAGNOSIS — Z8249 Family history of ischemic heart disease and other diseases of the circulatory system: Secondary | ICD-10-CM | POA: Insufficient documentation

## 2017-04-13 DIAGNOSIS — I739 Peripheral vascular disease, unspecified: Secondary | ICD-10-CM | POA: Diagnosis not present

## 2017-04-13 DIAGNOSIS — I5022 Chronic systolic (congestive) heart failure: Secondary | ICD-10-CM | POA: Diagnosis not present

## 2017-04-13 DIAGNOSIS — F17210 Nicotine dependence, cigarettes, uncomplicated: Secondary | ICD-10-CM | POA: Insufficient documentation

## 2017-04-13 DIAGNOSIS — I110 Hypertensive heart disease with heart failure: Secondary | ICD-10-CM | POA: Insufficient documentation

## 2017-04-13 DIAGNOSIS — Z7982 Long term (current) use of aspirin: Secondary | ICD-10-CM | POA: Diagnosis not present

## 2017-04-13 DIAGNOSIS — I2510 Atherosclerotic heart disease of native coronary artery without angina pectoris: Secondary | ICD-10-CM | POA: Insufficient documentation

## 2017-04-13 DIAGNOSIS — Z79899 Other long term (current) drug therapy: Secondary | ICD-10-CM | POA: Insufficient documentation

## 2017-04-13 LAB — LIPID PANEL
Cholesterol: 169 mg/dL (ref 0–200)
HDL: 37 mg/dL — AB (ref >40)
LDL Cholesterol: 104 mg/dL — ABNORMAL HIGH (ref 0–99)
Total CHOL/HDL Ratio: 4.6 RATIO
Triglycerides: 138 mg/dL (ref <150)
VLDL: 28 mg/dL (ref 0–40)

## 2017-04-13 LAB — BASIC METABOLIC PANEL
ANION GAP: 7 (ref 5–15)
BUN: 14 mg/dL (ref 6–20)
CALCIUM: 9.5 mg/dL (ref 8.9–10.3)
CO2: 24 mmol/L (ref 22–32)
Chloride: 106 mmol/L (ref 101–111)
Creatinine, Ser: 0.95 mg/dL (ref 0.61–1.24)
GFR calc Af Amer: >60 mL/min (ref >60)
GLUCOSE: 109 mg/dL — AB (ref 65–99)
Potassium: 3.9 mmol/L (ref 3.5–5.1)
Sodium: 137 mmol/L (ref 135–145)

## 2017-04-13 LAB — DIGOXIN LEVEL

## 2017-04-13 MED ORDER — CARVEDILOL 12.5 MG PO TABS: 18.7500 mg | ORAL_TABLET | Freq: Two times a day (BID) | ORAL | 3 refills | Status: DC

## 2017-04-13 NOTE — Progress Notes (Signed)
  Echocardiogram 2D Echocardiogram has been performed.  Auryn Paige 04/13/2017, 10:31 AM

## 2017-04-13 NOTE — Patient Instructions (Signed)
Increase Carvedilol to 18.75 mg (1 & 1/2 tab) Twice daily   Labs today  Your physician has requested that you have a lower or upper extremity arterial duplex. This test is an ultrasound of the arteries in the legs or arms. It looks at arterial blood flow in the legs and arms. Allow one hour for Lower and Upper Arterial scans. There are no restrictions or special instructions  We will contact you in 4 months to schedule your next appointment.

## 2017-04-14 NOTE — Progress Notes (Signed)
Patient ID: [name] [name], male   DOB: 1955/10/08, 61 y.o.   MRN: [id number]    Advanced Heart Failure Clinic Note   PCP: Dr Doreene Burke Primary Cardiologist: Dr Aundra Dubin  HPI: [name] [name] is a 61 year old with history of ETOH was found to have EF 10% by echo. He had exertional dyspnea and chest pain, taken for [facility] 5/16 showing nonobstructive CAD, elevated filling pressures (but not marked elevation), and low cardiac index. He was admitted to optimize HF. He was diuresed with IV lasix and transitioned to po lasix. Overall he diuresed 9 pounds. He continued on low dose carvedilol, lisinopril, bidil, and spironolactone.   [name] [name] returns today for regular followup. He has been doing well.  He is taking all his meds. He is not drinking any ETOH but still smokes.  No exertional dyspnea.  No lightheadedness, no chest pain.  No orthopnea/PND.  Thighs "tighten up" after walking about 1 block. This has been somewhat progressive.   Echo was done today and reviewed: EF remains low at 30-35%.   Labs (5/16): K 3.8, creatinine 0.94 => 1.19, HCT 48.4, LDL 121, HDL 50, digoxin 0.3, BNP 2343 => 367 Labs (6/16): K 4, creatinine 1.06 Labs (04/18/15): K 4.3 Creatinine 0.91 Labs (05/07/2015): K 4.5 Creatinine 0.88  Labs (10/16): K 4.6, creatinine 0.95, digoxin 0.5, TC 163 Labs (12/16) K 4.2, creatinine 0.90 Labs (2/17): K 3.8, creatinine 0.92 Labs (5/17): K 4, creatinine 0.92, digoxin 0.3 Labs (7/17): LFTs normal, LDL 70, HDL 34 Labs (8/17): digoxin 0.2, K 4.2, creatinine 0.88 Labs (1/18): digoxin 0.3, K 3.8, creatinine 0.88  PMH: 1. Low back pain 2. HTN 3. Hyperlipidemia 4. H/o cocaine abuse: Not active. 5. ETOH abuse: 1 pint/day 6. CAD: Coronary CT angiogram in 9/09 showed possible significant obstructive disease in the circumflex. He did not have cardiac cath.  7. Cardiomyopathy: Echo (9/09) with EF 35-40%. Echo (5/16) with EF 10%, mild LV dilation, grade II diastolic dysfunction, mildly  dilated RV with moderately decreased systolic function. LHC/RHC (5/16): Nonobstructive coronary disease, mean RA 10, PA 49/28, mean PCWP 25, CI 1.4.  Echo (9/16) with EF 20-25%, severe LV dilation. St Jude ICD.  - CPX 11/04/15: FVC 2.85 FEV1 1.98, MVV 89, Peak RER 0.94 (submaximal), peak VO2 13 (41.2% predicted), [id number] slope: 26.  This study was limited by hip pain.  - Echo 02/18/16 LVEF 25-30%, Grade 1 DD, Mild LAE, RV mildly reduced.  - Echo (7/18): EF 30-35%, mild RV dilation.  8. Active smoker 9. PAD: ABI L normal, R mildly decreased.   SH: Lives in [place], works as Dealer, lives with son, smokes 1 ppd, drank 1 pint/day liquor => quit 5/16, prior cocaine use.   FH: Brother with MI in his 43s, another brother with ?SCD, mother with CVA.   ROS: All systems reviewed and negative except as per HPI.   Current Outpatient Prescriptions  Medication Sig Dispense Refill  . aspirin 81 MG EC tablet TAKE 1 TABLET(81 MG) BY MOUTH DAILY. SWALLOW WHOLE 30 tablet 0  . carvedilol (COREG) 12.5 MG tablet Take 1.5 tablets (18.75 mg total) by mouth 2 (two) times daily with a meal. 180 tablet 3  . digoxin (LANOXIN) 0.125 MG tablet Take 1 tablet (125 mcg total) by mouth daily. 30 tablet 6  . isosorbide-hydrALAZINE (BIDIL) 20-37.5 MG tablet Take 2 tablets by mouth 3 (three) times daily. 180 tablet 5  . rosuvastatin (CRESTOR) 40 MG tablet Take 1 tablet (40 mg total) by mouth daily. [place]  tablet 3  . sacubitril-valsartan (ENTRESTO) 49-51 MG Take 1 tablet by mouth 2 (two) times daily. 60 tablet 6  . spironolactone (ALDACTONE) 25 MG tablet Take 1 tablet (25 mg total) by mouth daily. 90 tablet 3   No current facility-administered medications for this encounter.     Vitals:   04/13/17 1548  BP: 129/75  Pulse: 63  SpO2: 100%  Weight: 151 lb (68.5 kg)    Wt Readings from Last 3 Encounters:  04/13/17 151 lb (68.5 kg)  10/28/16 155 lb (70.3 kg)  10/12/16 156 lb 12 oz (71.1 kg)     PHYSICAL  EXAM: General:  NAD HEENT: normal Neck: supple. JVP not elevated. Carotids 2+ bilaterally; no bruits. No thyromegaly or lymphadenopathy appreciated. Cor: PMI laterally displaced. RRR, No M/G/R. Pedal pulses not palpable.  Lungs: Clear Abdomen: soft, non-tender, non-distended, no HSM. No bruits or masses. +BS  Extremities: no cyanosis, clubbing, rash.  No edema.  Neuro: alert & orientedx3, cranial nerves grossly intact. Moves all 4 extremities w/o difficulty. Affect pleasant.  ASSESSMENT & PLAN: 1. Chronic Systolic HF: Nonischemic cardiomyopathy with low output per RHC/LHC in 5/16.  Echo (9/16) with EF 20-25%, severe LV dilation s/p St Jude ICD.  Possible ETOH cardiomyopathy. NYHA class II symptoms, seems to be doing better overall.  Limited literacy.  Volume status stable on exam.  CPX (2/17) was submaximal due to hip pain so hard to interpret. Echo in 5/17 with LVEF 25-30%. Echo in 7/18 showed EF 30-35%.   - Has not needed lasix.  Continue to use only as needed for weight gains of 3 lbs over night or 5 lbs in a weeks time.  - Increase Coreg to 18.75 mg bid.  - Continue Spironolactone 25 mg daily.  BMET today.  - Continue Bidil 2 tabs tid.  - Continue Entresto 49-51 mg bid - Continue Digoxin 0.125 daily. Check level today.   2. ETOH Abuse: Former heavy drinker, he has quit.   3. Current Smoker: Continues to smoke 1/2 ppd. I strongly encouraged him to quit.  4. PAD: Claudication after walking about 1 block.  - Continued to encourage him to quit smoking.   - Continue ASA 81 and Crestor 40 daily.  - Encouraged to increase activity as able.   - I will arrange for peripheral arterial doppler evaluation.  5. Hyperlipidemia: Goal LDL < 70 with PAD - Continue Crestor 40 mg daily, check lipids today.  5. CAD: Nonobstructive. No evidence of ischemia.   Followup in 4 months.   [name] 04/14/2017

## 2017-04-15 ENCOUNTER — Telehealth (HOSPITAL_COMMUNITY): Payer: Self-pay | Admitting: *Deleted

## 2017-04-15 MED ORDER — EZETIMIBE 10 MG PO TABS: 10.0000 mg | ORAL_TABLET | Freq: Every day | ORAL | 3 refills | Status: DC

## 2017-04-15 NOTE — Telephone Encounter (Signed)
-----   Message from Larey Dresser, MD sent at 04/14/2017 10:32 PM EDT ----- LDL is higher than goal.  If he is taking Crestor 40 mg daily, would add Zetia 10 mg daily.  If he is not taking Crestor regularly, needs to start taking regularly. Repeat lipids 2 months.  Also make sure he is actually taking digoxin.

## 2017-04-18 ENCOUNTER — Other Ambulatory Visit (HOSPITAL_COMMUNITY): Payer: Self-pay | Admitting: *Deleted

## 2017-04-18 MED ORDER — EZETIMIBE 10 MG PO TABS: 10.0000 mg | ORAL_TABLET | Freq: Every day | ORAL | 3 refills | Status: DC

## 2017-04-28 ENCOUNTER — Ambulatory Visit (INDEPENDENT_AMBULATORY_CARE_PROVIDER_SITE_OTHER): Payer: BLUE CROSS/BLUE SHIELD | Admitting: *Deleted

## 2017-04-28 DIAGNOSIS — I428 Other cardiomyopathies: Secondary | ICD-10-CM

## 2017-04-28 NOTE — Progress Notes (Signed)
Remote ICD transmission.   

## 2017-04-29 ENCOUNTER — Encounter: Payer: Self-pay | Admitting: Cardiology

## 2017-05-06 ENCOUNTER — Ambulatory Visit (HOSPITAL_COMMUNITY)
Admission: RE | Admit: 2017-05-06 | Discharge: 2017-05-06 | Disposition: A | Discharge status: Home / Self Care | Payer: BLUE CROSS/BLUE SHIELD | Source: Ambulatory Visit | Attending: Cardiovascular Disease | Admitting: Cardiovascular Disease

## 2017-05-06 DIAGNOSIS — I739 Peripheral vascular disease, unspecified: Secondary | ICD-10-CM | POA: Diagnosis not present

## 2017-05-09 ENCOUNTER — Telehealth (HOSPITAL_COMMUNITY): Payer: Self-pay | Admitting: Surgery

## 2017-05-09 NOTE — Telephone Encounter (Signed)
Patient called to request a note from Dr Aundra Dubin for his "seatbelt".  He says that he is irritated and uncomfortable from use of the seatbelt and that he received a letter from [facility] prior for this reason.  I will forward to the [facility] Clinic Coordinator.

## 2017-05-10 NOTE — Telephone Encounter (Signed)
Spoke w/pt, advised we initially gave him a letter right after ICD implant, that's been a couple of years and we can not provided another letter for this, advised if bothering him to f/u w/Dr Klein's office and see if they can give him a letter, pt is agreeable

## 2017-06-03 ENCOUNTER — Other Ambulatory Visit (HOSPITAL_COMMUNITY): Payer: Self-pay | Admitting: Internal Medicine

## 2017-06-03 DIAGNOSIS — I428 Other cardiomyopathies: Secondary | ICD-10-CM

## 2017-06-06 LAB — CUP PACEART REMOTE DEVICE CHECK
Battery Remaining Longevity: 89 mo
Battery Remaining Percentage: 85 %
Brady Statistic RV Percent Paced: 1 %
HIGH POWER IMPEDANCE MEASURED VALUE: 73 Ohm
HIGH POWER IMPEDANCE MEASURED VALUE: 73 Ohm
Implantable Lead Implant Date: 20161012
Implantable Lead Model: 7122
Implantable Pulse Generator Implant Date: 20161012
Lead Channel Impedance Value: 590 Ohm
Lead Channel Pacing Threshold Amplitude: 0.75 V
Lead Channel Setting Pacing Amplitude: 2.5 V
Lead Channel Setting Sensing Sensitivity: 0.5 mV
MDC IDC LEAD LOCATION: 753860
MDC IDC MSMT BATTERY VOLTAGE: 3.02 V
MDC IDC MSMT LEADCHNL RV PACING THRESHOLD PULSEWIDTH: 0.5 ms
MDC IDC MSMT LEADCHNL RV SENSING INTR AMPL: 12 mV
MDC IDC PG SERIAL: 7308640
MDC IDC SESS DTM: 20180802060014
MDC IDC SET LEADCHNL RV PACING PULSEWIDTH: 0.5 ms

## 2017-07-20 ENCOUNTER — Other Ambulatory Visit (HOSPITAL_COMMUNITY): Payer: Self-pay | Admitting: Internal Medicine

## 2017-07-20 DIAGNOSIS — I428 Other cardiomyopathies: Secondary | ICD-10-CM

## 2017-07-20 MED ORDER — ISOSORB DINITRATE-HYDRALAZINE 20-37.5 MG PO TABS: 2.0000 | ORAL_TABLET | Freq: Three times a day (TID) | ORAL | 5 refills | Status: DC

## 2017-07-28 ENCOUNTER — Ambulatory Visit (INDEPENDENT_AMBULATORY_CARE_PROVIDER_SITE_OTHER): Payer: BLUE CROSS/BLUE SHIELD | Admitting: *Deleted

## 2017-07-28 DIAGNOSIS — I428 Other cardiomyopathies: Secondary | ICD-10-CM

## 2017-07-29 ENCOUNTER — Encounter: Payer: Self-pay | Admitting: Cardiology

## 2017-07-29 NOTE — Progress Notes (Signed)
Remote ICD transmission.   

## 2017-08-24 LAB — CUP PACEART REMOTE DEVICE CHECK
Battery Remaining Longevity: 85 mo
Battery Voltage: 3.01 V
Date Time Interrogation Session: 20181101060016
HIGH POWER IMPEDANCE MEASURED VALUE: 65 Ohm
HighPow Impedance: 65 Ohm
Implantable Lead Model: 7122
Lead Channel Sensing Intrinsic Amplitude: 12 mV
Lead Channel Setting Pacing Amplitude: 2.5 V
Lead Channel Setting Pacing Pulse Width: 0.5 ms
MDC IDC LEAD IMPLANT DT: 20161012
MDC IDC LEAD LOCATION: 753860
MDC IDC MSMT BATTERY REMAINING PERCENTAGE: 83 %
MDC IDC MSMT LEADCHNL RV IMPEDANCE VALUE: 460 Ohm
MDC IDC MSMT LEADCHNL RV PACING THRESHOLD AMPLITUDE: 0.75 V
MDC IDC MSMT LEADCHNL RV PACING THRESHOLD PULSEWIDTH: 0.5 ms
MDC IDC PG IMPLANT DT: 20161012
MDC IDC SET LEADCHNL RV SENSING SENSITIVITY: 0.5 mV
MDC IDC STAT BRADY RV PERCENT PACED: 1 %
Pulse Gen Serial Number: 7308640

## 2017-08-31 ENCOUNTER — Telehealth (HOSPITAL_COMMUNITY): Payer: Self-pay | Admitting: Surgery

## 2017-08-31 NOTE — Telephone Encounter (Signed)
Patient called to request an appointment.  He says he "thinks it is time.  He was on the recall list for November therefore I have added him to December 11 at 2pm. He is aware and agreeable.

## 2017-09-06 ENCOUNTER — Encounter (HOSPITAL_COMMUNITY): Payer: Self-pay

## 2017-09-06 ENCOUNTER — Telehealth (HOSPITAL_COMMUNITY): Payer: Self-pay

## 2017-09-06 ENCOUNTER — Ambulatory Visit (HOSPITAL_COMMUNITY)
Admission: RE | Admit: 2017-09-06 | Discharge: 2017-09-06 | Disposition: A | Discharge status: Home / Self Care | Payer: BLUE CROSS/BLUE SHIELD | Source: Ambulatory Visit | Attending: Internal Medicine | Admitting: Internal Medicine

## 2017-09-06 VITALS — BP 138/72 | HR 58 | Wt 152.0 lb

## 2017-09-06 DIAGNOSIS — F17210 Nicotine dependence, cigarettes, uncomplicated: Secondary | ICD-10-CM | POA: Insufficient documentation

## 2017-09-06 DIAGNOSIS — I739 Peripheral vascular disease, unspecified: Secondary | ICD-10-CM | POA: Diagnosis not present

## 2017-09-06 DIAGNOSIS — I428 Other cardiomyopathies: Secondary | ICD-10-CM | POA: Insufficient documentation

## 2017-09-06 DIAGNOSIS — I2510 Atherosclerotic heart disease of native coronary artery without angina pectoris: Secondary | ICD-10-CM | POA: Diagnosis not present

## 2017-09-06 DIAGNOSIS — Z7982 Long term (current) use of aspirin: Secondary | ICD-10-CM | POA: Insufficient documentation

## 2017-09-06 DIAGNOSIS — I110 Hypertensive heart disease with heart failure: Secondary | ICD-10-CM | POA: Diagnosis not present

## 2017-09-06 DIAGNOSIS — I5022 Chronic systolic (congestive) heart failure: Secondary | ICD-10-CM | POA: Diagnosis not present

## 2017-09-06 DIAGNOSIS — Z79899 Other long term (current) drug therapy: Secondary | ICD-10-CM | POA: Diagnosis not present

## 2017-09-06 DIAGNOSIS — F17219 Nicotine dependence, cigarettes, with unspecified nicotine-induced disorders: Secondary | ICD-10-CM

## 2017-09-06 DIAGNOSIS — E785 Hyperlipidemia, unspecified: Secondary | ICD-10-CM | POA: Insufficient documentation

## 2017-09-06 DIAGNOSIS — I10 Essential (primary) hypertension: Secondary | ICD-10-CM | POA: Diagnosis not present

## 2017-09-06 LAB — BASIC METABOLIC PANEL
ANION GAP: 7 (ref 5–15)
BUN: 12 mg/dL (ref 6–20)
CO2: 24 mmol/L (ref 22–32)
Calcium: 9 mg/dL (ref 8.9–10.3)
Chloride: 106 mmol/L (ref 101–111)
Creatinine, Ser: 1 mg/dL (ref 0.61–1.24)
GFR calc Af Amer: >60 mL/min (ref >60)
GFR calc non Af Amer: >60 mL/min (ref >60)
GLUCOSE: 138 mg/dL — AB (ref 65–99)
POTASSIUM: 3.6 mmol/L (ref 3.5–5.1)
Sodium: 137 mmol/L (ref 135–145)

## 2017-09-06 LAB — LIPID PANEL
CHOL/HDL RATIO: 3 ratio
Cholesterol: 108 mg/dL (ref 0–200)
HDL: 36 mg/dL — AB (ref >40)
LDL CALC: 43 mg/dL (ref 0–99)
Triglycerides: 146 mg/dL (ref <150)
VLDL: 29 mg/dL (ref 0–40)

## 2017-09-06 MED ORDER — NICOTINE 14 MG/24HR TD PT24: 14.0000 mg | MEDICATED_PATCH | Freq: Every day | TRANSDERMAL | 0 refills | Status: DC

## 2017-09-06 MED ORDER — SACUBITRIL-VALSARTAN 97-103 MG PO TABS: 1.0000 | ORAL_TABLET | Freq: Two times a day (BID) | ORAL | 6 refills | Status: DC

## 2017-09-06 NOTE — Progress Notes (Signed)
Patient ID: [name] [name], male   DOB: 05/24/1956, 61 y.o.   MRN: [id number]    Advanced Heart Failure Clinic Note   PCP: None per patient.  Primary Cardiologist: Dr Aundra Dubin  HPI: [name] [name] is a 61 year old with history of ETOH was found to have EF 10% by echo. He had exertional dyspnea and chest pain, taken for [facility] 5/16 showing nonobstructive CAD, elevated filling pressures (but not marked elevation), and low cardiac index. He was admitted to optimize HF. He was diuresed with IV lasix and transitioned to po lasix. Overall he diuresed 9 pounds. He continued on low dose carvedilol, lisinopril, bidil, and spironolactone.   He presents today for regular follow up. Doing well overall.  Taking all medications as directed. He denies DOE, orthopnea, PND, lightheadedness or dizziness. Denies CP. Still smoking 0.5 - 1 ppd. Thigh pain after walking is "stable". No other complaints. He does have "irritable bowel" type symptoms where after eating he needs to have a bowel movement. Doesn't have/follow with PCP.  Corevue: Personally reviewed, No VT/VF. Thoracic impedence depressed.  Echo 03/2017 30-35%.   Labs (12/16) K 4.2, creatinine 0.90 Labs (2/17): K 3.8, creatinine 0.92 Labs (5/17): K 4, creatinine 0.92, digoxin 0.3 Labs (7/17): LFTs normal, LDL 70, HDL 34 Labs (8/17): digoxin 0.2, K 4.2, creatinine 0.88 Labs (1/18): digoxin 0.3, K 3.8, creatinine 0.88  PMH: 1. Low back pain 2. HTN 3. Hyperlipidemia 4. H/o cocaine abuse: Not active. 5. ETOH abuse: 1 pint/day 6. CAD: Coronary CT angiogram in 9/09 showed possible significant obstructive disease in the circumflex. He did not have cardiac cath.  7. Cardiomyopathy: Echo (9/09) with EF 35-40%. Echo (5/16) with EF 10%, mild LV dilation, grade II diastolic dysfunction, mildly dilated RV with moderately decreased systolic function. LHC/RHC (5/16): Nonobstructive coronary disease, mean RA 10, PA 49/28, mean PCWP 25, CI 1.4.  Echo (9/16)  with EF 20-25%, severe LV dilation. St Jude ICD.  - CPX 11/04/15: FVC 2.85 FEV1 1.98, MVV 89, Peak RER 0.94 (submaximal), peak VO2 13 (41.2% predicted), YI/AX65 slope: 26.  This study was limited by hip pain.  - Echo 02/18/16 LVEF 25-30%, Grade 1 DD, Mild LAE, RV mildly reduced.  - Echo (7/18): EF 30-35%, mild RV dilation.  8. Active smoker 9. PAD: ABI L normal, R mildly decreased.   SH: Lives in [place], works as Dealer, lives with son, smokes 1 ppd, drank 1 pint/day liquor => quit 5/16, prior cocaine use.   FH: Brother with MI in his 4s, another brother with ?SCD, mother with CVA.   Review of systems complete and found to be negative unless listed in HPI.    Current Outpatient Medications  Medication Sig Dispense Refill  . aspirin 81 MG EC tablet TAKE 1 TABLET(81 MG) BY MOUTH DAILY. SWALLOW WHOLE 30 tablet 11  . carvedilol (COREG) 12.5 MG tablet Take 1.5 tablets (18.75 mg total) by mouth 2 (two) times daily with a meal. 180 tablet 3  . digoxin (LANOXIN) 0.125 MG tablet Take 1 tablet (125 mcg total) by mouth daily. 30 tablet 6  . ENTRESTO 49-51 MG TAKE 1 TABLET BY MOUTH TWICE DAILY 60 tablet 3  . ezetimibe (ZETIA) 10 MG tablet Take 1 tablet (10 mg total) by mouth daily. 30 tablet 3  . isosorbide-hydrALAZINE (BIDIL) 20-37.5 MG tablet Take 2 tablets by mouth 3 (three) times daily. 180 tablet 5  . rosuvastatin (CRESTOR) 40 MG tablet Take 1 tablet (40 mg total) by mouth daily. 30 tablet 3  .  spironolactone (ALDACTONE) 25 MG tablet Take 1 tablet (25 mg total) by mouth daily. 90 tablet 3   No current facility-administered medications for this encounter.    Vitals:   09/06/17 1350  BP: 138/72  Pulse: (!) 58  SpO2: 100%  Weight: 152 lb (68.9 kg)   Wt Readings from Last 3 Encounters:  09/06/17 152 lb (68.9 kg)  04/13/17 151 lb (68.5 kg)  10/28/16 155 lb (70.3 kg)    PHYSICAL EXAM: General: Well appearing. No resp difficulty. HEENT: Normal Neck: Supple. JVP 7-8 cm. Carotids 2+  bilat; no bruits. No thyromegaly or nodule noted. Cor: PMI nondisplaced. RRR, No M/G/R noted Lungs: CTAB, normal effort. Abdomen: Soft, non-tender, non-distended, no HSM. No bruits or masses. +BS  Extremities: No cyanosis, clubbing, or rash. R and LLE no edema.  Neuro: Alert & orientedx3, cranial nerves grossly intact. moves all 4 extremities w/o difficulty. Affect pleasant   ASSESSMENT & PLAN: 1. Chronic Systolic HF: Nonischemic cardiomyopathy with low output per RHC/LHC in 5/16.  Echo (9/16) with EF 20-25%, severe LV dilation s/p St Jude ICD.  Possible ETOH cardiomyopathy. NYHA class II symptoms, seems to be doing better overall.  Limited literacy. CPX (2/17) was submaximal due to hip pain so hard to interpret.  - Echo 03/2017 showed EF 30-35%.   - NYHA class II symptoms - Volume status stable on exam. Takes lasix as needed only.  - Continue Coreg 18.75 mg BID - Continue Spironolactone 25 mg daily. BMET today.   - Continue Bidil 2 tabs TID. - Increase Entresto 97/103 mg BID.  - Continue Digoxin 0.125 daily. Level stable at last visit.  2. ETOH Abuse: Former heavy drinker. Now abstinent.  3. Current Smoker:  - Continues to smoke 0.5 - 1.0 ppd.  - Will send nicotine patches.  - Encouraged complete cessation.  4. PAD: Claudication after walking about 1 block.  - Continued to encourage him to quit smoking.   - Continue ASA 81 and Crestor 40 daily.  - Encouraged increased activity as tolerated.  - ABIs normal 04/2017.  5. Hyperlipidemia: Goal LDL < 70 with PAD - Continue Crestor 40 mg daily. - Continue Zetia 10 mg daily - Lipids today per Dr. Aundra Dubin.   5. CAD: Nonobstructive.  - No s/s of ischemia.    - Continue ASA, statin, and Zetia.  6. Non specific GI symptoms - Recommended PCP follow up. He is due colonoscopy screening.   Meds as above. Labs today and repeat 1-2 weeks with Entresto increase. Will involve CSW for PCP referral for health maintenance.   Shirley Friar,  PA-C  09/06/2017   Greater than 50% of the 25 minute visit was spent in counseling/coordination of care regarding disease state education, salt/fluid restriction, sliding scale diuretics, and medication compliance.

## 2017-09-06 NOTE — Addendum Note (Signed)
Encounter addended by: Effie Berkshire, RN on: 09/06/2017 2:21 PM  Actions taken: Pharmacy for encounter modified, Order list changed

## 2017-09-06 NOTE — Patient Instructions (Signed)
START Nicotine patch once every 24 hrs as directed on package instructions.  INCREASE Entresto to 97/103 mg twice daily. Can "double up" on current 49/51 mg tablets you have at home (Take 2 tabs twice daily). New Rx has been sent to your pharmacy for 97/103 mg tablets (Take 1 tab twice daily).  Routine lab work today. Will notify you of abnormal results, otherwise no news is good news!  Return in 1-2 weeks for repeat labs.  __________________________________________________________________ [name] [name] Code: 2297  Follow up 3 months with Dr. Aundra Dubin.  __________________________________________________________________ [name] [name] Code: 9002  Take all medication as prescribed the day of your appointment. Bring all medications with you to your appointment.  Do the following things EVERYDAY: 1) Weigh yourself in the morning before breakfast. Write it down and keep it in a log. 2) Take your medicines as prescribed 3) Eat low salt foods-Limit salt (sodium) to 2000 mg per day.  4) Stay as active as you can everyday 5) Limit all fluids for the day to less than 2 liters

## 2017-09-06 NOTE — Telephone Encounter (Signed)
CHF Clinic appointment reminder call placed to patient for upcoming follow up.  Does understand purpose of this appointment and where CHF Clinic is located? Yes  How is patient feeling? Well, no complaints  Does patient have all of their medications? Yes  Patient also reminded to take all medications as prescribed on the day of his/her appointment and to bring all medications to this appointment.  Advised to call our office for tardiness or cancellations/rescheduling needs.  [name] [name], [name] [name]

## 2017-09-07 ENCOUNTER — Telehealth: Payer: Self-pay | Admitting: Licensed Clinical Social Worker

## 2017-09-07 NOTE — Telephone Encounter (Signed)
CSW referred to assist with PCP. CSW contacted patient and will provide PCP list when he comes for lab appointment on 12/20 per patient request. CSW available as needed. Raquel Sarna, [place], [place]

## 2017-09-15 ENCOUNTER — Ambulatory Visit (HOSPITAL_COMMUNITY)
Admission: RE | Admit: 2017-09-15 | Discharge: 2017-09-15 | Disposition: A | Discharge status: Home / Self Care | Payer: BLUE CROSS/BLUE SHIELD | Source: Ambulatory Visit | Attending: Cardiology | Admitting: Cardiology

## 2017-09-15 DIAGNOSIS — I5022 Chronic systolic (congestive) heart failure: Secondary | ICD-10-CM | POA: Insufficient documentation

## 2017-09-15 LAB — BASIC METABOLIC PANEL
Anion gap: 6 (ref 5–15)
BUN: 14 mg/dL (ref 6–20)
CHLORIDE: 107 mmol/L (ref 101–111)
CO2: 26 mmol/L (ref 22–32)
CREATININE: 1.04 mg/dL (ref 0.61–1.24)
Calcium: 9.3 mg/dL (ref 8.9–10.3)
GFR calc non Af Amer: >60 mL/min (ref >60)
GLUCOSE: 149 mg/dL — AB (ref 65–99)
Potassium: 4 mmol/L (ref 3.5–5.1)
Sodium: 139 mmol/L (ref 135–145)

## 2017-09-15 NOTE — Addendum Note (Signed)
Encounter addended by: Louann Liv, LCSW on: 09/15/2017 11:28 AM  Actions taken: Sign clinical note

## 2017-09-15 NOTE — Progress Notes (Signed)
CSW met with patient who requested assistance with obtaining a PCP. CSW provided a list of options and patient will follow up. CSW available as needed. Raquel Sarna, [place], [place]

## 2017-10-21 ENCOUNTER — Telehealth (HOSPITAL_COMMUNITY): Payer: Self-pay | Admitting: Surgery

## 2017-10-21 DIAGNOSIS — E785 Hyperlipidemia, unspecified: Secondary | ICD-10-CM

## 2017-10-21 MED ORDER — EZETIMIBE 10 MG PO TABS: 10.0000 mg | ORAL_TABLET | Freq: Every day | ORAL | 3 refills | Status: DC

## 2017-10-21 MED ORDER — ROSUVASTATIN CALCIUM 40 MG PO TABS: 40.0000 mg | ORAL_TABLET | Freq: Every day | ORAL | 3 refills | Status: DC

## 2017-10-21 NOTE — Telephone Encounter (Signed)
Patient called requesting refills for his Zetia and Crestor.  I have sent these refill prescriptions to [facility] on [address] as he requested.

## 2017-10-27 ENCOUNTER — Ambulatory Visit (INDEPENDENT_AMBULATORY_CARE_PROVIDER_SITE_OTHER): Payer: BLUE CROSS/BLUE SHIELD | Admitting: *Deleted

## 2017-10-27 DIAGNOSIS — I428 Other cardiomyopathies: Secondary | ICD-10-CM

## 2017-10-27 NOTE — Progress Notes (Signed)
Remote ICD transmission.   

## 2017-10-28 ENCOUNTER — Encounter: Payer: Self-pay | Admitting: Cardiology

## 2017-11-04 ENCOUNTER — Other Ambulatory Visit: Payer: Self-pay | Admitting: Internal Medicine

## 2017-11-13 LAB — CUP PACEART REMOTE DEVICE CHECK
Battery Remaining Longevity: 84 mo
Battery Remaining Percentage: 81 %
Battery Voltage: 2.99 V
Brady Statistic RV Percent Paced: 1 %
Date Time Interrogation Session: 20190131070017
HIGH POWER IMPEDANCE MEASURED VALUE: 68 Ohm
HighPow Impedance: 68 Ohm
Implantable Lead Implant Date: 20161012
Implantable Lead Location: 753860
Implantable Lead Model: 7122
Implantable Pulse Generator Implant Date: 20161012
Lead Channel Impedance Value: 460 Ohm
Lead Channel Pacing Threshold Amplitude: 0.75 V
Lead Channel Pacing Threshold Pulse Width: 0.5 ms
Lead Channel Sensing Intrinsic Amplitude: 11.8 mV
MDC IDC SET LEADCHNL RV PACING AMPLITUDE: 2.5 V
MDC IDC SET LEADCHNL RV PACING PULSEWIDTH: 0.5 ms
MDC IDC SET LEADCHNL RV SENSING SENSITIVITY: 0.5 mV
Pulse Gen Serial Number: 7308640

## 2017-12-14 ENCOUNTER — Telehealth (HOSPITAL_COMMUNITY): Payer: Self-pay | Admitting: Pharmacist

## 2017-12-14 ENCOUNTER — Encounter (HOSPITAL_COMMUNITY): Payer: Self-pay | Admitting: Cardiology

## 2017-12-14 ENCOUNTER — Ambulatory Visit (HOSPITAL_COMMUNITY)
Admission: RE | Admit: 2017-12-14 | Discharge: 2017-12-14 | Disposition: A | Discharge status: Home / Self Care | Payer: BLUE CROSS/BLUE SHIELD | Source: Ambulatory Visit | Attending: Cardiology | Admitting: Cardiology

## 2017-12-14 VITALS — BP 107/64 | HR 54 | Wt 144.8 lb

## 2017-12-14 DIAGNOSIS — I2510 Atherosclerotic heart disease of native coronary artery without angina pectoris: Secondary | ICD-10-CM | POA: Diagnosis not present

## 2017-12-14 DIAGNOSIS — Z79899 Other long term (current) drug therapy: Secondary | ICD-10-CM | POA: Insufficient documentation

## 2017-12-14 DIAGNOSIS — I5022 Chronic systolic (congestive) heart failure: Secondary | ICD-10-CM | POA: Diagnosis not present

## 2017-12-14 DIAGNOSIS — I429 Cardiomyopathy, unspecified: Secondary | ICD-10-CM | POA: Insufficient documentation

## 2017-12-14 DIAGNOSIS — F17210 Nicotine dependence, cigarettes, uncomplicated: Secondary | ICD-10-CM | POA: Diagnosis not present

## 2017-12-14 DIAGNOSIS — Z7982 Long term (current) use of aspirin: Secondary | ICD-10-CM | POA: Diagnosis not present

## 2017-12-14 DIAGNOSIS — I110 Hypertensive heart disease with heart failure: Secondary | ICD-10-CM | POA: Diagnosis present

## 2017-12-14 DIAGNOSIS — I739 Peripheral vascular disease, unspecified: Secondary | ICD-10-CM | POA: Diagnosis not present

## 2017-12-14 DIAGNOSIS — E785 Hyperlipidemia, unspecified: Secondary | ICD-10-CM | POA: Insufficient documentation

## 2017-12-14 LAB — BASIC METABOLIC PANEL
Anion gap: 8 (ref 5–15)
BUN: 10 mg/dL (ref 6–20)
CHLORIDE: 103 mmol/L (ref 101–111)
CO2: 26 mmol/L (ref 22–32)
CREATININE: 0.97 mg/dL (ref 0.61–1.24)
Calcium: 9.4 mg/dL (ref 8.9–10.3)
GFR calc non Af Amer: >60 mL/min (ref >60)
Glucose, Bld: 115 mg/dL — ABNORMAL HIGH (ref 65–99)
Potassium: 4 mmol/L (ref 3.5–5.1)
Sodium: 137 mmol/L (ref 135–145)

## 2017-12-14 LAB — DIGOXIN LEVEL: Digoxin Level: <0.2 ng/mL — ABNORMAL LOW (ref 0.8–2.0)

## 2017-12-14 MED ORDER — VARENICLINE TARTRATE 0.5 MG X 11 & 1 MG X 42 PO MISC: ORAL | 0 refills | Status: DC

## 2017-12-14 NOTE — Telephone Encounter (Signed)
Entresto 97-103 mg BID PA approved by [employer] commercial through 09/26/38.   Ruta Hinds. Velva Harman, PharmD, BCPS, CPP Clinical Pharmacist Phone: [phone] 12/14/2017 2:38 PM

## 2017-12-14 NOTE — Telephone Encounter (Signed)
Bidil PA approved by [employer] commercial through 09/26/38.  Ruta Hinds. Velva Harman, PharmD, BCPS, CPP Clinical Pharmacist Phone: [phone] 12/14/2017 3:28 PM

## 2017-12-14 NOTE — Progress Notes (Signed)
Medication Samples have been provided to the patient.  Drug name: Delene Loll       Strength: 49/51        Qty: 6  LOT: [id number]  Exp.Date: 1/21  Dosing instructions: Take 97/103 (2 tabs), twice a day   The patient has been instructed regarding the correct time, dose, and frequency of taking this medication, including desired effects and most common side effects.   [name] 11:07 AM 12/14/2017   Medication Samples have been provided to the patient.  Drug name: Bidil       Strength: 20/37.5 mg        Qty: 2  LOT: [id number]  Exp.Date: 04/2019  Dosing instructions: Take 2 tabs, three times a day  The patient has been instructed regarding the correct time, dose, and frequency of taking this medication, including desired effects and most common side effects.   [name] 11:21 AM 12/14/2017

## 2017-12-14 NOTE — Progress Notes (Signed)
Patient ID: [name] [name], male   DOB: 1956-05-26, 62 y.o.   MRN: [id number]    Advanced Heart Failure Clinic Note   PCP: Dr Doreene Burke Primary Cardiologist: Dr Aundra Dubin  HPI: [name] is a 62 y.o. with history of ETOH was found to have EF 10% by echo. He had exertional dyspnea and chest pain, taken for [facility] 5/16 showing nonobstructive CAD, elevated filling pressures (but not marked elevation), and low cardiac index. He was admitted to optimize HF. He was diuresed with IV lasix and transitioned to po lasix. Overall he diuresed 9 pounds. He continued on carvedilol, lisinopril, bidil, and spironolactone. Since then, his HF meds have been titrated up.   [name] returns today for followup of CHF.  He still smokes but no longer drinks ETOH.  No significant exertional dyspnea, he feels good overall.  No chest pain.  No orthopnea/PND.  Weight is trending down.  He is still doing some work on cars.  Labs (5/16): K 3.8, creatinine 0.94 => 1.19, HCT 48.4, LDL 121, HDL 50, digoxin 0.3, BNP 2343 => 367 Labs (6/16): K 4, creatinine 1.06 Labs (04/18/15): K 4.3 Creatinine 0.91 Labs (05/07/2015): K 4.5 Creatinine 0.88  Labs (10/16): K 4.6, creatinine 0.95, digoxin 0.5, TC 163 Labs (12/16) K 4.2, creatinine 0.90 Labs (2/17): K 3.8, creatinine 0.92 Labs (5/17): K 4, creatinine 0.92, digoxin 0.3 Labs (7/17): LFTs normal, LDL 70, HDL 34 Labs (8/17): digoxin 0.2, K 4.2, creatinine 0.88 Labs (1/18): digoxin 0.3, K 3.8, creatinine 0.88 Labs (12/18): K 4, creatinine 1.07, LDL 43, HDL 36  PMH: 1. Low back pain 2. HTN 3. Hyperlipidemia 4. H/o cocaine abuse: Not active. 5. ETOH abuse: 1 pint/day 6. CAD: Coronary CT angiogram in 9/09 showed possible significant obstructive disease in the circumflex. He did not have cardiac cath.  7. Cardiomyopathy: Echo (9/09) with EF 35-40%. Echo (5/16) with EF 10%, mild LV dilation, grade II diastolic dysfunction, mildly dilated RV with moderately decreased systolic  function. LHC/RHC (5/16): Nonobstructive coronary disease, mean RA 10, PA 49/28, mean PCWP 25, CI 1.4.  Echo (9/16) with EF 20-25%, severe LV dilation. St Jude ICD.  - CPX 11/04/15: FVC 2.85 FEV1 1.98, MVV 89, Peak RER 0.94 (submaximal), peak VO2 13 (41.2% predicted), AU/QJ33 slope: 26.  This study was limited by hip pain.  - Echo 02/18/16 LVEF 25-30%, Grade 1 DD, Mild LAE, RV mildly reduced.  - Echo (7/18): EF 30-35%, mild RV dilation.  8. Active smoker 9. PAD: ABI L normal, R mildly decreased.  - ABIs (8/18): Normal.   SH: Lives in [place], works as Dealer, lives with son, smokes 1 ppd, drank 1 pint/day liquor => quit 5/16, prior cocaine use.   FH: Brother with MI in his 76s, another brother with ?SCD, mother with CVA.   ROS: All systems reviewed and negative except as per HPI.   Current Outpatient Medications  Medication Sig Dispense Refill  . aspirin 81 MG EC tablet TAKE 1 TABLET(81 MG) BY MOUTH DAILY. SWALLOW WHOLE 30 tablet 11  . carvedilol (COREG) 12.5 MG tablet Take 1.5 tablets (18.75 mg total) by mouth 2 (two) times daily with a meal. 180 tablet 3  . digoxin (LANOXIN) 0.125 MG tablet Take 1 tablet (125 mcg total) by mouth daily. 30 tablet 6  . ezetimibe (ZETIA) 10 MG tablet Take 1 tablet (10 mg total) by mouth daily. 30 tablet 3  . isosorbide-hydrALAZINE (BIDIL) 20-37.5 MG tablet Take 2 tablets by mouth 3 (three) times daily. 180 tablet  5  . rosuvastatin (CRESTOR) 40 MG tablet Take 1 tablet (40 mg total) by mouth daily. 30 tablet 3  . sacubitril-valsartan (ENTRESTO) 97-103 MG Take 1 tablet by mouth 2 (two) times daily. 60 tablet 6  . spironolactone (ALDACTONE) 25 MG tablet Take 1 tablet (25 mg total) by mouth daily. 90 tablet 3  . nicotine (NICODERM CQ - DOSED IN MG/24 HOURS) 14 mg/24hr patch Place 1 patch (14 mg total) onto the skin daily. 28 patch 0  . varenicline (CHANTIX STARTING MONTH PAK) 0.5 MG X 11 & 1 MG X 42 tablet Take one 0.5 mg tablet by mouth once daily for 3  days, then increase to one 0.5 mg tablet twice daily for 4 days, then increase to one 1 mg tablet twice daily. 53 tablet 0   No current facility-administered medications for this encounter.     Vitals:   12/14/17 1038  BP: 107/64  Pulse: (!) 54  SpO2: 96%  Weight: 144 lb 12.8 oz (65.7 kg)    Wt Readings from Last 3 Encounters:  12/14/17 144 lb 12.8 oz (65.7 kg)  09/06/17 152 lb (68.9 kg)  04/13/17 151 lb (68.5 kg)     PHYSICAL EXAM: General: NAD Neck: No JVD, no thyromegaly or thyroid nodule.  Lungs: Clear to auscultation bilaterally with normal respiratory effort. CV: Nondisplaced PMI.  Heart regular S1/S2, no S3/S4, no murmur.  No peripheral edema.  No carotid bruit.  Normal pedal pulses.  Abdomen: Soft, nontender, no hepatosplenomegaly, no distention.  Skin: Intact without lesions or rashes.  Neurologic: Alert and oriented x 3.  Psych: Normal affect. Extremities: No clubbing or cyanosis.  HEENT: Normal.   ASSESSMENT & PLAN: 1. Chronic Systolic HF: Nonischemic cardiomyopathy with low output per RHC/LHC in 5/16.  Echo (9/16) with EF 20-25%, severe LV dilation s/p St Jude ICD.  Possible ETOH cardiomyopathy. Limited literacy.  Volume status stable on exam.  CPX (2/17) was submaximal due to hip pain so hard to interpret. Echo in 5/17 with LVEF 25-30%. Echo in 7/18 showed EF 30-35%.  He is not volume overloaded on exam, NYHA class II symptoms.  - Has not needed lasix.  Continue to use only as needed for weight gains of 3 lbs over night or 5 lbs in a weeks time.  - Continue Coreg 18.75 mg bid.  Will not increase with relatively low HR.   - Continue spironolactone 25 mg daily.  BMET today.  - Continue Bidil 2 tabs tid.  - Continue Entresto 97/103 mg bid - Continue Digoxin 0.125 daily. Check level today.   2. ETOH Abuse: Former heavy drinker, he has quit.   3. Current Smoker: Continues to smoke. I strongly encouraged him to quit. He is willing to try Chantix, I will give him a  prescription.  4. PAD: No significant claudication now.  ABIs in 8/18 were normal.  - Continued to encourage him to quit smoking.   - Continue ASA 81 and Crestor 40 daily.   5. Hyperlipidemia: Goal LDL < 70 with PAD - Good lipids in 12/18.   5. CAD: Nonobstructive on prior cath.   Followup in 4 months.   [name] 12/14/2017

## 2017-12-14 NOTE — Patient Instructions (Signed)
Start Chantix for smoking cessation  Labs drawn today (if we do not call you, then your lab work was stable)   Samples given  Your physician recommends that you schedule a follow-up appointment in: 4 months (July, 2019) with Dr. Aundra Dubin  Please Call an Schedule Appointment

## 2017-12-20 ENCOUNTER — Other Ambulatory Visit: Payer: Self-pay

## 2017-12-20 ENCOUNTER — Encounter (INDEPENDENT_AMBULATORY_CARE_PROVIDER_SITE_OTHER): Payer: Self-pay | Admitting: Physician Assistant

## 2017-12-20 ENCOUNTER — Ambulatory Visit (INDEPENDENT_AMBULATORY_CARE_PROVIDER_SITE_OTHER): Payer: BLUE CROSS/BLUE SHIELD | Admitting: Physician Assistant

## 2017-12-20 VITALS — BP 124/78 | HR 51 | Temp 97.7°F | Ht 73.0 in | Wt 145.4 lb

## 2017-12-20 DIAGNOSIS — L723 Sebaceous cyst: Secondary | ICD-10-CM

## 2017-12-20 DIAGNOSIS — I509 Heart failure, unspecified: Secondary | ICD-10-CM | POA: Diagnosis not present

## 2017-12-20 DIAGNOSIS — R7303 Prediabetes: Secondary | ICD-10-CM

## 2017-12-20 DIAGNOSIS — Z1159 Encounter for screening for other viral diseases: Secondary | ICD-10-CM | POA: Diagnosis not present

## 2017-12-20 DIAGNOSIS — Z131 Encounter for screening for diabetes mellitus: Secondary | ICD-10-CM

## 2017-12-20 DIAGNOSIS — Z23 Encounter for immunization: Secondary | ICD-10-CM

## 2017-12-20 DIAGNOSIS — D1722 Benign lipomatous neoplasm of skin and subcutaneous tissue of left arm: Secondary | ICD-10-CM

## 2017-12-20 LAB — POCT GLYCOSYLATED HEMOGLOBIN (HGB A1C)

## 2017-12-20 MED ORDER — METFORMIN HCL ER 500 MG PO TB24
500.0000 mg | ORAL_TABLET | Freq: Every day | ORAL | 3 refills | Status: DC
Start: 2017-12-20 — End: 2022-09-19

## 2017-12-20 NOTE — Progress Notes (Signed)
Subjective:  Patient ID: [name] [name], male    DOB: 1956/03/27  Age: 62 y.o. MRN: [id number]  CC: CHF  HPI [name] [name] is a RHD 62 y.o. male with a medical history of ETOH abuse, Cocaine abuse, Tobacco use disorder, HTN, CHF, CAD, Nonischemic cardiomyopathy, implanted AICD 07/09/15, and PAD presents as a new patient to establish general care. Last seen cardiologist six days ago for CHF, weight 144lbs, s/p St Jude ICD, advised Lasix PRN. Weight today 145. Says he has abstained from ETOH and cocaine since many years ago. Still smoking approximately a pack a day. Prescribed Chantix but has not picked up medication yet.     Says he is concerned about a "bump" on his head. Onset approximately one month ago. Painless. No injury, no bleeding, no suppuration.      Left elbow with a soft lump in the medial aspect that progressively grew over a 4-5 year period. Painless, does not restrict motion, no loss of strength, no tingling/numbness, and no suppuration or bleeding.           Outpatient Medications Prior to Visit  Medication Sig Dispense Refill  . aspirin 81 MG EC tablet TAKE 1 TABLET(81 MG) BY MOUTH DAILY. SWALLOW WHOLE 30 tablet 11  . carvedilol (COREG) 12.5 MG tablet Take 1.5 tablets (18.75 mg total) by mouth 2 (two) times daily with a meal. 180 tablet 3  . digoxin (LANOXIN) 0.125 MG tablet Take 1 tablet (125 mcg total) by mouth daily. 30 tablet 6  . ezetimibe (ZETIA) 10 MG tablet Take 1 tablet (10 mg total) by mouth daily. 30 tablet 3  . isosorbide-hydrALAZINE (BIDIL) 20-37.5 MG tablet Take 2 tablets by mouth 3 (three) times daily. 180 tablet 5  . nicotine (NICODERM CQ - DOSED IN MG/24 HOURS) 14 mg/24hr patch Place 1 patch (14 mg total) onto the skin daily. 28 patch 0  . rosuvastatin (CRESTOR) 40 MG tablet Take 1 tablet (40 mg total) by mouth daily. 30 tablet 3  . sacubitril-valsartan (ENTRESTO) 97-103 MG Take 1 tablet by mouth 2 (two) times daily. 60 tablet 6  . spironolactone  (ALDACTONE) 25 MG tablet Take 1 tablet (25 mg total) by mouth daily. 90 tablet 3  . varenicline (CHANTIX STARTING MONTH PAK) 0.5 MG X 11 & 1 MG X 42 tablet Take one 0.5 mg tablet by mouth once daily for 3 days, then increase to one 0.5 mg tablet twice daily for 4 days, then increase to one 1 mg tablet twice daily. 53 tablet 0   No facility-administered medications prior to visit.      ROS Review of Systems  Constitutional: Negative for chills, fever and malaise/fatigue.  Eyes: Negative for blurred vision.  Respiratory: Negative for shortness of breath.   Cardiovascular: Negative for chest pain, palpitations and leg swelling.  Gastrointestinal: Negative for abdominal pain and nausea.  Genitourinary: Negative for dysuria and hematuria.  Musculoskeletal: Negative for joint pain and myalgias.  Skin: Negative for rash.       Bump on head and lump in left elbow  Neurological: Negative for tingling and headaches.  Psychiatric/Behavioral: Negative for depression. The patient is not nervous/anxious.     Objective:  Ht 6\' 1"  (1.854 m)   Wt 145 lb 6.4 oz (66 kg)   BMI 19.18 kg/m   BP/Weight 12/20/2017 12/14/2017 03/47/4259  Systolic BP - 563 875  Diastolic BP - 64 72  Wt. (Lbs) 145.4 144.8 152  BMI 19.18 19.1 20.05  Physical Exam  Constitutional: He is oriented to person, place, and time.  Thin, NAD, polite  HENT:  Head: Normocephalic and atraumatic.  No oral thrush.  Eyes: Conjunctivae are normal. No scleral icterus.  Neck: Normal range of motion. Neck supple. No thyromegaly present.  Cardiovascular: Normal rate, regular rhythm and normal heart sounds.  No LE edema bilaterally  Pulmonary/Chest: Effort normal and breath sounds normal.  Abdominal: Soft. Bowel sounds are normal. There is no tenderness.  Musculoskeletal: He exhibits no edema.  Neurological: He is alert and oriented to person, place, and time. No cranial nerve deficit. Coordination normal.  Skin: Skin is warm  and dry. No rash noted. No erythema. No pallor.  Approximately 1.5 cm, freely movable, non-tender nodule on the scalp. Large, soft, freely movable, and non-tender mass on the medial aspect of the left elbow.   Psychiatric: He has a normal mood and affect. His behavior is normal. Thought content normal.  Vitals reviewed.    Assessment & Plan:   1. Chronic congestive heart failure, unspecified heart failure type ([place])  2. Prediabetes - metFORMIN (GLUCOPHAGE XR) 500 MG 24 hr tablet; Take 1 tablet (500 mg total) by mouth daily with breakfast.  Dispense: 90 tablet; Refill: 3  3. Need for prophylactic vaccination and inoculation against influenza - Flu Vaccine QUAD 6+ mos PF IM (Fluarix Quad PF); Future  4. Encounter for hepatitis C screening test for low risk patient - Hepatitis c antibody (reflex)  5. Need for Tdap vaccination - Tdap vaccine greater than or equal to 7yo IM; Future  6. Screening for diabetes mellitus - HgB A1c 6.3%  7. Sebaceous cyst - Ambulatory referral to Dermatology  8. Lipoma of left upper extremity - Ambulatory referral to Dermatology   Meds ordered this encounter  Medications  . metFORMIN (GLUCOPHAGE XR) 500 MG 24 hr tablet    Sig: Take 1 tablet (500 mg total) by mouth daily with breakfast.    Dispense:  90 tablet    Refill:  3    Order Specific Question:   Supervising Provider    Answer:   Tresa Garter [[id number]]    Follow-up: Return if symptoms worsen or fail to improve.   Clent Demark PA

## 2017-12-20 NOTE — Patient Instructions (Addendum)
Prediabetes Eating Plan Prediabetes-also called impaired glucose tolerance or impaired fasting glucose-is a condition that causes blood sugar (blood glucose) levels to be higher than normal. Following a healthy diet can help to keep prediabetes under control. It can also help to lower the risk of type 2 diabetes and heart disease, which are increased in people who have prediabetes. Along with regular exercise, a healthy diet:  Promotes weight loss.  Helps to control blood sugar levels.  Helps to improve the way that the body uses insulin.  What do I need to know about this eating plan?  Use the glycemic index (GI) to plan your meals. The index tells you how quickly a food will raise your blood sugar. Choose low-GI foods. These foods take a longer time to raise blood sugar.  Pay close attention to the amount of carbohydrates in the food that you eat. Carbohydrates increase blood sugar levels.  Keep track of how many calories you take in. Eating the right amount of calories will help you to achieve a healthy weight. Losing about 7 percent of your starting weight can help to prevent type 2 diabetes.  You may want to follow a Mediterranean diet. This diet includes a lot of vegetables, lean meats or fish, whole grains, fruits, and healthy oils and fats. What foods can I eat? Grains Whole grains, such as whole-wheat or whole-grain breads, crackers, cereals, and pasta. Unsweetened oatmeal. Bulgur. Barley. Quinoa. Brown rice. Corn or whole-wheat flour tortillas or taco shells. Vegetables Lettuce. Spinach. Peas. Beets. Cauliflower. Cabbage. Broccoli. Carrots. Tomatoes. Squash. Eggplant. Herbs. Peppers. Onions. Cucumbers. Brussels sprouts. Fruits Berries. Bananas. Apples. Oranges. Grapes. Papaya. Mango. Pomegranate. Kiwi. Grapefruit. Cherries. Meats and Other Protein Sources Seafood. Lean meats, such as chicken and [place] or lean cuts of pork and beef. Tofu. Eggs. Nuts. Beans. Dairy Low-fat or  fat-free dairy products, such as yogurt, cottage cheese, and cheese. Beverages Water. Tea. Coffee. Sugar-free or diet soda. Seltzer water. Milk. Milk alternatives, such as soy or almond milk. Condiments Mustard. Relish. Low-fat, low-sugar ketchup. Low-fat, low-sugar barbecue sauce. Low-fat or fat-free mayonnaise. Sweets and Desserts Sugar-free or low-fat pudding. Sugar-free or low-fat ice cream and other frozen treats. Fats and Oils Avocado. Walnuts. Olive oil. The items listed above may not be a complete list of recommended foods or beverages. Contact your dietitian for more options. What foods are not recommended? Grains Refined white flour and flour products, such as bread, pasta, snack foods, and cereals. Beverages Sweetened drinks, such as sweet iced tea and soda. Sweets and Desserts Baked goods, such as cake, cupcakes, pastries, cookies, and cheesecake. The items listed above may not be a complete list of foods and beverages to avoid. Contact your dietitian for more information. This information is not intended to replace advice given to you by your health care provider. Make sure you discuss any questions you have with your health care provider. Document Released: 01/28/2015 Document Revised: 02/19/2016 Document Reviewed: 10/09/2014 Elsevier Interactive Patient Education  2017 [employer].   Epidermal Cyst An epidermal cyst is sometimes called an epidermal inclusion cyst or an infundibular cyst. It is a sac made of skin tissue. The sac contains a substance called keratin. Keratin is a protein that is normally secreted through the hair follicles. When keratin becomes trapped in the top layer of skin (epidermis), it can form an epidermal cyst. Epidermal cysts are usually found on the face, neck, trunk, and genitals. These cysts are usually harmless (benign), and they may not cause symptoms unless they  become infected. It is important not to pop epidermal cysts yourself. What are the  causes? This condition may be caused by:  A blocked hair follicle.  A hair that curls and re-enters the skin instead of growing straight out of the skin (ingrown hair).  A blocked pore.  Irritated skin.  An injury to the skin.  Certain conditions that are passed along from parent to child (inherited).  Human papillomavirus (HPV).  What increases the risk? The following factors may make you more likely to develop an epidermal cyst:  Having acne.  Being overweight.  Wearing tight clothing.  What are the signs or symptoms? The only symptom of this condition may be a small, painless lump underneath the skin. When an epidermal cyst becomes infected, symptoms may include:  Redness.  Inflammation.  Tenderness.  Warmth.  Fever.  Keratin draining from the cyst. Keratin may look like a grayish-white, bad-smelling substance.  Pus draining from the cyst.  How is this diagnosed? This condition is diagnosed with a physical exam. In some cases, you may have a sample of tissue (biopsy) taken from your cyst to be examined under a microscope or tested for bacteria. You may be referred to a health care provider who specializes in skin care (dermatologist). How is this treated? In many cases, epidermal cysts go away on their own without treatment. If a cyst becomes infected, treatment may include:  Opening and draining the cyst. After draining, minor surgery to remove the rest of the cyst may be done.  Antibiotic medicine to help prevent infection.  Injections of medicines (steroids) that help to reduce inflammation.  Surgery to remove the cyst. Surgery may be done if: ? The cyst becomes large. ? The cyst bothers you. ? There is a chance that the cyst could turn into cancer.  Follow these instructions at home:  Take over-the-counter and prescription medicines only as told by your health care provider.  If you were prescribed an antibiotic, use it as told by your health care  provider. Do not stop using the antibiotic even if you start to feel better.  Keep the area around your cyst clean and dry.  Wear loose, dry clothing.  Do not try to pop your cyst.  Avoid touching your cyst.  Check your cyst every day for signs of infection.  Keep all follow-up visits as told by your health care provider. This is important. How is this prevented?  Wear clean, dry, clothing.  Avoid wearing tight clothing.  Keep your skin clean and dry. Shower or take baths every day.  Wash your body with a benzoyl peroxide wash when you shower or bathe. Contact a health care provider if:  Your cyst develops symptoms of infection.  Your condition is not improving or is getting worse.  You develop a cyst that looks different from other cysts you have had.  You have a fever. Get help right away if:  Redness spreads from the cyst into the surrounding area. This information is not intended to replace advice given to you by your health care provider. Make sure you discuss any questions you have with your health care provider. Document Released: 08/14/2004 Document Revised: 05/12/2016 Document Reviewed: 07/16/2015 Elsevier Interactive Patient Education  [employer].

## 2017-12-21 LAB — HCV COMMENT:

## 2017-12-21 LAB — HEPATITIS C ANTIBODY (REFLEX)

## 2017-12-23 ENCOUNTER — Telehealth (INDEPENDENT_AMBULATORY_CARE_PROVIDER_SITE_OTHER): Payer: Self-pay

## 2017-12-23 NOTE — Telephone Encounter (Signed)
-----   Message from Clent Demark, PA-C sent at 12/22/2017  8:02 PM EDT ----- Negative for HCV.

## 2017-12-23 NOTE — Telephone Encounter (Signed)
Patient aware of negative Hepatitis C. Nat Christen, CMA

## 2017-12-24 ENCOUNTER — Other Ambulatory Visit (HOSPITAL_COMMUNITY): Payer: Self-pay | Admitting: Internal Medicine

## 2017-12-27 ENCOUNTER — Other Ambulatory Visit (HOSPITAL_COMMUNITY): Payer: Self-pay | Admitting: *Deleted

## 2017-12-27 MED ORDER — CARVEDILOL 12.5 MG PO TABS: 18.7500 mg | ORAL_TABLET | Freq: Two times a day (BID) | ORAL | 3 refills | Status: DC

## 2017-12-30 ENCOUNTER — Encounter: Payer: BLUE CROSS/BLUE SHIELD | Admitting: Nurse Practitioner

## 2018-01-25 ENCOUNTER — Other Ambulatory Visit (HOSPITAL_COMMUNITY): Payer: Self-pay | Admitting: Internal Medicine

## 2018-01-25 DIAGNOSIS — I428 Other cardiomyopathies: Secondary | ICD-10-CM

## 2018-01-26 ENCOUNTER — Ambulatory Visit (INDEPENDENT_AMBULATORY_CARE_PROVIDER_SITE_OTHER): Payer: BLUE CROSS/BLUE SHIELD | Admitting: *Deleted

## 2018-01-26 DIAGNOSIS — I428 Other cardiomyopathies: Secondary | ICD-10-CM

## 2018-01-26 NOTE — Progress Notes (Signed)
Remote ICD transmission.   

## 2018-01-31 ENCOUNTER — Encounter: Payer: Self-pay | Admitting: Cardiology

## 2018-02-14 LAB — CUP PACEART REMOTE DEVICE CHECK
Battery Remaining Longevity: 82 mo
Battery Remaining Percentage: 79 %
Battery Voltage: 2.99 V
Brady Statistic RV Percent Paced: 1 %
Date Time Interrogation Session: 20190502060017
HighPow Impedance: 64 Ohm
HighPow Impedance: 64 Ohm
Implantable Lead Implant Date: 20161012
Implantable Lead Location: 753860
Implantable Lead Model: 7122
Implantable Pulse Generator Implant Date: 20161012
Lead Channel Impedance Value: 450 Ohm
Lead Channel Pacing Threshold Amplitude: 0.75 V
Lead Channel Pacing Threshold Pulse Width: 0.5 ms
Lead Channel Sensing Intrinsic Amplitude: 11.8 mV
Lead Channel Setting Pacing Amplitude: 2.5 V
Lead Channel Setting Pacing Pulse Width: 0.5 ms
Lead Channel Setting Sensing Sensitivity: 0.5 mV
Pulse Gen Serial Number: 7308640

## 2018-03-15 ENCOUNTER — Other Ambulatory Visit (HOSPITAL_COMMUNITY): Payer: Self-pay | Admitting: Internal Medicine

## 2018-03-15 DIAGNOSIS — I428 Other cardiomyopathies: Secondary | ICD-10-CM

## 2018-03-15 DIAGNOSIS — I5022 Chronic systolic (congestive) heart failure: Secondary | ICD-10-CM

## 2018-03-16 ENCOUNTER — Other Ambulatory Visit (HOSPITAL_COMMUNITY): Payer: Self-pay

## 2018-03-16 MED ORDER — EZETIMIBE 10 MG PO TABS
10.0000 mg | ORAL_TABLET | Freq: Every day | ORAL | 3 refills | Status: DC
Start: 2018-03-16 — End: 2018-07-11

## 2018-04-14 ENCOUNTER — Other Ambulatory Visit (HOSPITAL_COMMUNITY): Payer: Self-pay | Admitting: *Deleted

## 2018-04-14 DIAGNOSIS — E785 Hyperlipidemia, unspecified: Secondary | ICD-10-CM

## 2018-04-14 MED ORDER — ROSUVASTATIN CALCIUM 40 MG PO TABS: 40.0000 mg | ORAL_TABLET | Freq: Every day | ORAL | 3 refills | Status: DC

## 2018-04-18 ENCOUNTER — Telehealth (HOSPITAL_COMMUNITY): Payer: Self-pay | Admitting: *Deleted

## 2018-04-18 NOTE — Telephone Encounter (Signed)
Pt now has [employer], member ID #: [id number], completed PA for pt's rosuvastatin, med approved 09/25/17 through 09/26/18.  Info sent to pharmacy.

## 2018-04-27 ENCOUNTER — Ambulatory Visit (INDEPENDENT_AMBULATORY_CARE_PROVIDER_SITE_OTHER): Payer: Medicare Other | Admitting: *Deleted

## 2018-04-27 ENCOUNTER — Encounter: Payer: Self-pay | Admitting: Cardiology

## 2018-04-27 DIAGNOSIS — I428 Other cardiomyopathies: Secondary | ICD-10-CM | POA: Diagnosis not present

## 2018-04-27 NOTE — Progress Notes (Signed)
Remote ICD transmission.   

## 2018-06-02 ENCOUNTER — Telehealth (HOSPITAL_COMMUNITY): Payer: Self-pay | Admitting: Pharmacist

## 2018-06-02 NOTE — Telephone Encounter (Signed)
Bidil PA approved by [employer] Part D through 09/26/18.   Ruta Hinds. Velva Harman, PharmD, BCPS, CPP Clinical Pharmacist Phone: [phone] 06/02/2018 9:50 AM

## 2018-06-07 ENCOUNTER — Ambulatory Visit (HOSPITAL_COMMUNITY)
Admission: RE | Admit: 2018-06-07 | Discharge: 2018-06-07 | Disposition: A | Discharge status: Home / Self Care | Payer: Medicare Other | Source: Ambulatory Visit | Attending: Cardiology | Admitting: Cardiology

## 2018-06-07 ENCOUNTER — Other Ambulatory Visit: Payer: Self-pay

## 2018-06-07 ENCOUNTER — Encounter (HOSPITAL_COMMUNITY): Payer: Self-pay | Admitting: Cardiology

## 2018-06-07 VITALS — BP 109/64 | HR 65 | Wt 144.4 lb

## 2018-06-07 DIAGNOSIS — Z7982 Long term (current) use of aspirin: Secondary | ICD-10-CM | POA: Diagnosis not present

## 2018-06-07 DIAGNOSIS — I110 Hypertensive heart disease with heart failure: Secondary | ICD-10-CM | POA: Diagnosis present

## 2018-06-07 DIAGNOSIS — F1010 Alcohol abuse, uncomplicated: Secondary | ICD-10-CM | POA: Diagnosis not present

## 2018-06-07 DIAGNOSIS — I2510 Atherosclerotic heart disease of native coronary artery without angina pectoris: Secondary | ICD-10-CM | POA: Insufficient documentation

## 2018-06-07 DIAGNOSIS — E785 Hyperlipidemia, unspecified: Secondary | ICD-10-CM | POA: Diagnosis not present

## 2018-06-07 DIAGNOSIS — I5022 Chronic systolic (congestive) heart failure: Secondary | ICD-10-CM | POA: Insufficient documentation

## 2018-06-07 DIAGNOSIS — F17219 Nicotine dependence, cigarettes, with unspecified nicotine-induced disorders: Secondary | ICD-10-CM

## 2018-06-07 DIAGNOSIS — Z7984 Long term (current) use of oral hypoglycemic drugs: Secondary | ICD-10-CM | POA: Insufficient documentation

## 2018-06-07 DIAGNOSIS — I739 Peripheral vascular disease, unspecified: Secondary | ICD-10-CM | POA: Insufficient documentation

## 2018-06-07 DIAGNOSIS — F17210 Nicotine dependence, cigarettes, uncomplicated: Secondary | ICD-10-CM | POA: Diagnosis not present

## 2018-06-07 DIAGNOSIS — Z79899 Other long term (current) drug therapy: Secondary | ICD-10-CM | POA: Insufficient documentation

## 2018-06-07 DIAGNOSIS — I429 Cardiomyopathy, unspecified: Secondary | ICD-10-CM | POA: Insufficient documentation

## 2018-06-07 LAB — BASIC METABOLIC PANEL
ANION GAP: 8 (ref 5–15)
BUN: 12 mg/dL (ref 8–23)
CHLORIDE: 106 mmol/L (ref 98–111)
CO2: 23 mmol/L (ref 22–32)
Calcium: 9.4 mg/dL (ref 8.9–10.3)
Creatinine, Ser: 0.92 mg/dL (ref 0.61–1.24)
GFR calc non Af Amer: >60 mL/min (ref >60)
Glucose, Bld: 101 mg/dL — ABNORMAL HIGH (ref 70–99)
Potassium: 3.7 mmol/L (ref 3.5–5.1)
SODIUM: 137 mmol/L (ref 135–145)

## 2018-06-07 LAB — DIGOXIN LEVEL

## 2018-06-07 MED ORDER — SACUBITRIL-VALSARTAN 97-103 MG PO TABS: 1.0000 | ORAL_TABLET | Freq: Two times a day (BID) | ORAL | 6 refills | Status: DC

## 2018-06-07 NOTE — Patient Instructions (Signed)
Increase Entresto back up to 97/103 mg Twice daily   Labs today  Labs in 10 days  Your physician has requested that you have an echocardiogram. Echocardiography is a painless test that uses sound waves to create images of your heart. It provides your doctor with information about the size and shape of your heart and how well your heart's chambers and valves are working. This procedure takes approximately one hour. There are no restrictions for this procedure.  We will contact you in 4 months to schedule your next appointment.

## 2018-06-08 LAB — CUP PACEART REMOTE DEVICE CHECK
Battery Remaining Longevity: 80 mo
Battery Remaining Percentage: 78 %
Battery Voltage: 2.98 V
Brady Statistic RV Percent Paced: 1 %
Date Time Interrogation Session: 20190801060016
HIGH POWER IMPEDANCE MEASURED VALUE: 62 Ohm
HighPow Impedance: 62 Ohm
Implantable Lead Implant Date: 20161012
Lead Channel Pacing Threshold Amplitude: 0.75 V
Lead Channel Sensing Intrinsic Amplitude: 11.8 mV
Lead Channel Setting Pacing Amplitude: 2.5 V
Lead Channel Setting Sensing Sensitivity: 0.5 mV
MDC IDC LEAD LOCATION: 753860
MDC IDC MSMT LEADCHNL RV IMPEDANCE VALUE: 450 Ohm
MDC IDC MSMT LEADCHNL RV PACING THRESHOLD PULSEWIDTH: 0.5 ms
MDC IDC PG IMPLANT DT: 20161012
MDC IDC PG SERIAL: 7308640
MDC IDC SET LEADCHNL RV PACING PULSEWIDTH: 0.5 ms

## 2018-06-08 NOTE — Progress Notes (Signed)
Patient ID: [name] [name], male   DOB: 06/10/1956, 62 y.o.   MRN: [id number]    Advanced Heart Failure Clinic Note   PCP: Dr Doreene Burke Primary Cardiologist: Dr Aundra Dubin  HPI: [name] [name] is a 62 y.o. with history of ETOH was found to have EF 10% by echo. He had exertional dyspnea and chest pain, taken for [facility] 5/16 showing nonobstructive CAD, elevated filling pressures (but not marked elevation), and low cardiac index. He was admitted to optimize HF. He was diuresed with IV lasix and transitioned to po lasix. Overall he diuresed 9 pounds. He continued on carvedilol, lisinopril, bidil, and spironolactone. Since then, his HF meds have been titrated up.   [name] [name] returns today for followup of CHF.  He still smokes about 1 ppd but no longer drinks ETOH.  He is using Chantix and has cut back smoking some.  No significant exertional dyspnea though not very active.  No chest pain.  No orthopnea/PND. His Entresto was cut back at some point to 49/51 bid, but he is not sure when or why.   St Jude device interrogation: stable thoracic impedance, no VT.   Labs (5/16): K 3.8, creatinine 0.94 => 1.19, HCT 48.4, LDL 121, HDL 50, digoxin 0.3, BNP 2343 => 367 Labs (6/16): K 4, creatinine 1.06 Labs (04/18/15): K 4.3 Creatinine 0.91 Labs (05/07/2015): K 4.5 Creatinine 0.88  Labs (10/16): K 4.6, creatinine 0.95, digoxin 0.5, TC 163 Labs (12/16) K 4.2, creatinine 0.90 Labs (2/17): K 3.8, creatinine 0.92 Labs (5/17): K 4, creatinine 0.92, digoxin 0.3 Labs (7/17): LFTs normal, LDL 70, HDL 34 Labs (8/17): digoxin 0.2, K 4.2, creatinine 0.88 Labs (1/18): digoxin 0.3, K 3.8, creatinine 0.88 Labs (12/18): K 4, creatinine 1.07, LDL 43, HDL 36 Labs (3/19): digoxin < 0.2, K 4, creatinine 0.97  PMH: 1. Low back pain 2. HTN 3. Hyperlipidemia 4. H/o cocaine abuse: Not active. 5. ETOH abuse: 1 pint/day 6. CAD: Coronary CT angiogram in 9/09 showed possible significant obstructive disease in the circumflex. He  did not have cardiac cath.  7. Cardiomyopathy: Echo (9/09) with EF 35-40%. Echo (5/16) with EF 10%, mild LV dilation, grade II diastolic dysfunction, mildly dilated RV with moderately decreased systolic function. LHC/RHC (5/16): Nonobstructive coronary disease, mean RA 10, PA 49/28, mean PCWP 25, CI 1.4.  Echo (9/16) with EF 20-25%, severe LV dilation. St Jude ICD.  - CPX 11/04/15: FVC 2.85 FEV1 1.98, MVV 89, Peak RER 0.94 (submaximal), peak VO2 13 (41.2% predicted), HM/CN47 slope: 26.  This study was limited by hip pain.  - Echo 02/18/16 LVEF 25-30%, Grade 1 DD, Mild LAE, RV mildly reduced.  - Echo (7/18): EF 30-35%, mild RV dilation.  8. Active smoker 9. PAD: ABI L normal, R mildly decreased.  - ABIs (8/18): Normal.  10. Type II diabetes: Borderline.   SH: Lives in [place], works as Dealer, lives with son, smokes 1 ppd, drank 1 pint/day liquor => quit 5/16, prior cocaine use.   FH: Brother with MI in his 21s, another brother with ?SCD, mother with CVA.   ROS: All systems reviewed and negative except as per HPI.   Current Outpatient Medications  Medication Sig Dispense Refill  . aspirin 81 MG EC tablet TAKE 1 TABLET(81 MG) BY MOUTH DAILY. SWALLOW WHOLE 30 tablet 11  . carvedilol (COREG) 12.5 MG tablet Take 1.5 tablets (18.75 mg total) by mouth 2 (two) times daily with a meal. 270 tablet 3  . DIGOX 125 MCG tablet TAKE 1 TABLET(125 MCG)  BY MOUTH DAILY 30 tablet 3  . ezetimibe (ZETIA) 10 MG tablet Take 1 tablet (10 mg total) by mouth daily. 30 tablet 3  . isosorbide-hydrALAZINE (BIDIL) 20-37.5 MG tablet Take 2 tablets by mouth 3 (three) times daily. 180 tablet 5  . metFORMIN (GLUCOPHAGE XR) 500 MG 24 hr tablet Take 1 tablet (500 mg total) by mouth daily with breakfast. 90 tablet 3  . nicotine (NICODERM CQ - DOSED IN MG/24 HOURS) 14 mg/24hr patch Place 1 patch (14 mg total) onto the skin daily. 28 patch 0  . rosuvastatin (CRESTOR) 40 MG tablet Take 1 tablet (40 mg total) by mouth daily.  30 tablet 3  . sacubitril-valsartan (ENTRESTO) 97-103 MG Take 1 tablet by mouth 2 (two) times daily. 60 tablet 6  . spironolactone (ALDACTONE) 25 MG tablet TAKE 1 TABLET BY MOUTH DAILY 90 tablet 2  . varenicline (CHANTIX) 0.5 MG tablet Take 0.5 mg by mouth 2 (two) times daily.     No current facility-administered medications for this encounter.     Vitals:   06/07/18 0933  BP: 109/64  Pulse: 65  SpO2: 100%  Weight: 65.5 kg (144 lb 6 oz)    Wt Readings from Last 3 Encounters:  06/07/18 65.5 kg (144 lb 6 oz)  12/20/17 66 kg (145 lb 6.4 oz)  12/14/17 65.7 kg (144 lb 12.8 oz)     PHYSICAL EXAM: General: NAD Neck: No JVD, no thyromegaly or thyroid nodule.  Lungs: Clear to auscultation bilaterally with normal respiratory effort. CV: Nondisplaced PMI.  Heart regular S1/S2, no S3/S4, no murmur.  No peripheral edema.  No carotid bruit.  Normal pedal pulses.  Abdomen: Soft, nontender, no hepatosplenomegaly, no distention.  Skin: Intact without lesions or rashes.  Neurologic: Alert and oriented x 3.  Psych: Normal affect. Extremities: No clubbing or cyanosis.  HEENT: Normal.   ASSESSMENT & PLAN: 1. Chronic Systolic HF: Nonischemic cardiomyopathy with low output per RHC/LHC in 5/16.  Echo (9/16) with EF 20-25%, severe LV dilation s/p St Jude ICD.  Possible ETOH cardiomyopathy. Limited literacy.  Volume status stable on exam.  CPX (2/17) was submaximal due to hip pain so hard to interpret. Echo in 5/17 with LVEF 25-30%. Echo in 7/18 showed EF 30-35%.  He is not volume overloaded on exam, NYHA class II symptoms.  - Has not needed lasix.  Continue to use only as needed for weight gains of 3 lbs over night or 5 lbs in a weeks time.  - Continue Coreg 18.75 mg bid.   - Continue spironolactone 25 mg daily.  BMET today.  - Continue Bidil 2 tabs tid.  - Increase Entresto to 97/103 bid.  BMET today and again in 10 days.  - Continue Digoxin 0.125 daily. Check level today.   - I will get a repeat  echo.  2. ETOH Abuse: Former heavy drinker, he has quit.   3. Current Smoker: Continues to smoke but using Chantix and trying to quit. 4. PAD: No significant claudication now.  ABIs in 8/18 were normal.  - Continued to encourage him to quit smoking.   - Continue ASA 81 and Crestor 40 daily.   5. Hyperlipidemia: Goal LDL < 70 with PAD - Good lipids in 12/18.   5. CAD: Nonobstructive on prior cath.   Followup in 4 months.   [name] 06/08/2018

## 2018-06-21 ENCOUNTER — Ambulatory Visit (HOSPITAL_COMMUNITY)
Admission: RE | Admit: 2018-06-21 | Discharge: 2018-06-21 | Disposition: A | Discharge status: Home / Self Care | Payer: Medicare Other | Source: Ambulatory Visit | Attending: Internal Medicine | Admitting: Internal Medicine

## 2018-06-21 ENCOUNTER — Ambulatory Visit (HOSPITAL_COMMUNITY)
Admission: RE | Admit: 2018-06-21 | Discharge: 2018-06-21 | Disposition: A | Discharge status: Home / Self Care | Payer: Medicare Other | Source: Ambulatory Visit | Attending: Physician Assistant | Admitting: Physician Assistant

## 2018-06-21 DIAGNOSIS — I080 Rheumatic disorders of both mitral and aortic valves: Secondary | ICD-10-CM | POA: Insufficient documentation

## 2018-06-21 DIAGNOSIS — F1010 Alcohol abuse, uncomplicated: Secondary | ICD-10-CM | POA: Insufficient documentation

## 2018-06-21 DIAGNOSIS — Z95810 Presence of automatic (implantable) cardiac defibrillator: Secondary | ICD-10-CM | POA: Insufficient documentation

## 2018-06-21 DIAGNOSIS — I2510 Atherosclerotic heart disease of native coronary artery without angina pectoris: Secondary | ICD-10-CM | POA: Diagnosis not present

## 2018-06-21 DIAGNOSIS — I5022 Chronic systolic (congestive) heart failure: Secondary | ICD-10-CM | POA: Diagnosis not present

## 2018-06-21 DIAGNOSIS — Z720 Tobacco use: Secondary | ICD-10-CM | POA: Insufficient documentation

## 2018-06-21 LAB — BASIC METABOLIC PANEL
Anion gap: 7 (ref 5–15)
BUN: 15 mg/dL (ref 8–23)
CALCIUM: 9.4 mg/dL (ref 8.9–10.3)
CO2: 26 mmol/L (ref 22–32)
CREATININE: 0.98 mg/dL (ref 0.61–1.24)
Chloride: 106 mmol/L (ref 98–111)
GFR calc non Af Amer: >60 mL/min (ref >60)
Glucose, Bld: 118 mg/dL — ABNORMAL HIGH (ref 70–99)
Potassium: 4.3 mmol/L (ref 3.5–5.1)
SODIUM: 139 mmol/L (ref 135–145)

## 2018-06-21 NOTE — Progress Notes (Signed)
  Echocardiogram 2D Echocardiogram has been performed.  [name] [name] 06/21/2018, 11:05 AM

## 2018-06-22 ENCOUNTER — Telehealth (HOSPITAL_COMMUNITY): Payer: Self-pay

## 2018-06-22 NOTE — Telephone Encounter (Signed)
Pt called and informed of his ECHO results

## 2018-06-26 ENCOUNTER — Other Ambulatory Visit: Payer: Self-pay | Admitting: Internal Medicine

## 2018-07-11 ENCOUNTER — Encounter (INDEPENDENT_AMBULATORY_CARE_PROVIDER_SITE_OTHER): Payer: Self-pay | Admitting: Physician Assistant

## 2018-07-11 ENCOUNTER — Other Ambulatory Visit (HOSPITAL_COMMUNITY): Payer: Self-pay

## 2018-07-11 ENCOUNTER — Encounter

## 2018-07-11 ENCOUNTER — Ambulatory Visit (INDEPENDENT_AMBULATORY_CARE_PROVIDER_SITE_OTHER): Payer: Medicare Other | Admitting: Physician Assistant

## 2018-07-11 ENCOUNTER — Other Ambulatory Visit: Payer: Self-pay

## 2018-07-11 VITALS — BP 119/72 | HR 59 | Temp 97.7°F | Ht 73.0 in | Wt 146.6 lb

## 2018-07-11 DIAGNOSIS — R7303 Prediabetes: Secondary | ICD-10-CM | POA: Diagnosis not present

## 2018-07-11 DIAGNOSIS — J0110 Acute frontal sinusitis, unspecified: Secondary | ICD-10-CM | POA: Diagnosis not present

## 2018-07-11 DIAGNOSIS — L309 Dermatitis, unspecified: Secondary | ICD-10-CM | POA: Diagnosis not present

## 2018-07-11 LAB — POCT GLYCOSYLATED HEMOGLOBIN (HGB A1C): Hemoglobin A1C: 6 % — AB (ref 4.0–5.6)

## 2018-07-11 MED ORDER — EZETIMIBE 10 MG PO TABS
10.0000 mg | ORAL_TABLET | Freq: Every day | ORAL | 5 refills | Status: DC
Start: 2018-07-11 — End: 2019-03-19

## 2018-07-11 MED ORDER — DM-APAP-CPM 15-500-2 MG PO TABS: 2.0000 | ORAL_TABLET | Freq: Four times a day (QID) | ORAL | 0 refills | Status: AC

## 2018-07-11 MED ORDER — TRIAMCINOLONE ACETONIDE 0.5 % EX OINT: 1.0000 "application " | TOPICAL_OINTMENT | Freq: Two times a day (BID) | CUTANEOUS | 0 refills | Status: DC

## 2018-07-11 MED ORDER — GUAIFENESIN ER 1200 MG PO TB12: 1.0000 | ORAL_TABLET | Freq: Two times a day (BID) | ORAL | 0 refills | Status: AC

## 2018-07-11 MED ORDER — AMOXICILLIN-POT CLAVULANATE 875-125 MG PO TABS: 1.0000 | ORAL_TABLET | Freq: Two times a day (BID) | ORAL | 0 refills | Status: AC

## 2018-07-11 NOTE — Progress Notes (Signed)
Subjective:  Patient ID: [name] [name], male    DOB: Aug 06, 1956  Age: 62 y.o. MRN: [id number]  CC: URI   HPI [name] [name] is a RHD 62 y.o. male with a medical history of ETOH abuse, Cocaine abuse, Tobacco use disorder, HTN, CHF, CAD, Nonischemic cardiomyopathy, implanted AICD 07/09/15, and PAD presents with two week history of nasal congestion, cough, and frontal sinus discomfort. Feels he is slowing feeling better but concerned about the length of the symptoms. Denies sore throat, fever, chills, rash, nausea, vomiting, constipation, diarrhea, or any other symptoms.     Outpatient Medications Prior to Visit  Medication Sig Dispense Refill  . aspirin 81 MG EC tablet TAKE 1 TABLET(81 MG) BY MOUTH DAILY. SWALLOW WHOLE 30 tablet 11  . carvedilol (COREG) 12.5 MG tablet Take 1.5 tablets (18.75 mg total) by mouth 2 (two) times daily with a meal. 270 tablet 3  . DIGOX 125 MCG tablet TAKE 1 TABLET(125 MCG) BY MOUTH DAILY 30 tablet 3  . ezetimibe (ZETIA) 10 MG tablet Take 1 tablet (10 mg total) by mouth daily. 30 tablet 3  . isosorbide-hydrALAZINE (BIDIL) 20-37.5 MG tablet Take 2 tablets by mouth 3 (three) times daily. 180 tablet 5  . metFORMIN (GLUCOPHAGE XR) 500 MG 24 hr tablet Take 1 tablet (500 mg total) by mouth daily with breakfast. 90 tablet 3  . rosuvastatin (CRESTOR) 40 MG tablet Take 1 tablet (40 mg total) by mouth daily. 30 tablet 3  . sacubitril-valsartan (ENTRESTO) 97-103 MG Take 1 tablet by mouth 2 (two) times daily. 60 tablet 6  . spironolactone (ALDACTONE) 25 MG tablet TAKE 1 TABLET BY MOUTH DAILY 90 tablet 2  . varenicline (CHANTIX) 0.5 MG tablet Take 0.5 mg by mouth 2 (two) times daily.    . nicotine (NICODERM CQ - DOSED IN MG/24 HOURS) 14 mg/24hr patch Place 1 patch (14 mg total) onto the skin daily. 28 patch 0   No facility-administered medications prior to visit.      ROS Review of Systems  Constitutional: Negative for chills, fever and malaise/fatigue.  HENT:  Positive for congestion and sinus pain.   Eyes: Negative for blurred vision.  Respiratory: Positive for cough. Negative for shortness of breath.   Cardiovascular: Negative for chest pain and palpitations.  Gastrointestinal: Negative for abdominal pain and nausea.  Genitourinary: Negative for dysuria and hematuria.  Musculoskeletal: Negative for joint pain and myalgias.  Skin: Negative for rash.  Neurological: Negative for tingling and headaches.  Psychiatric/Behavioral: Negative for depression. The patient is not nervous/anxious.     Objective:  BP 119/72 (BP Location: Left Arm, Patient Position: Sitting, Cuff Size: Normal)   Pulse (!) 59   Temp 97.7 F (36.5 C) (Oral)   Ht 6\' 1"  (1.854 m)   Wt 146 lb 9.6 oz (66.5 kg)   SpO2 98%   BMI 19.34 kg/m   BP/Weight 07/11/2018 06/07/2018 3/71/6967  Systolic BP 893 810 175  Diastolic BP 72 64 78  Wt. (Lbs) 146.6 144.38 145.4  BMI 19.34 19.05 19.18      Physical Exam  Constitutional: He is oriented to person, place, and time.  Well developed, well nourished, NAD, polite  HENT:  Head: Normocephalic and atraumatic.  Mouth/Throat: Oropharynx is clear and moist. No oropharyngeal exudate.  Mild TTP of the frontal sinuses. Turbinates hypertrophic.   Eyes: Pupils are equal, round, and reactive to light. Conjunctivae are normal. Right eye exhibits no discharge. Left eye exhibits no discharge. No scleral icterus.  Neck: Normal range  of motion. Neck supple. No thyromegaly present.  Cardiovascular: Normal rate, regular rhythm and normal heart sounds.  Pulmonary/Chest: Effort normal and breath sounds normal.  Musculoskeletal: He exhibits no edema.  Lymphadenopathy:    He has no cervical adenopathy.  Neurological: He is alert and oriented to person, place, and time.  Skin: Skin is warm and dry. No rash noted. No erythema. No pallor.  Psychiatric: He has a normal mood and affect. His behavior is normal. Thought content normal.  Vitals  reviewed.    Assessment & Plan:   1. Acute non-recurrent frontal sinusitis - amoxicillin-clavulanate (AUGMENTIN) 875-125 MG tablet; Take 1 tablet by mouth 2 (two) times daily for 10 days.  Dispense: 20 tablet; Refill: 0 - DM-APAP-CPM (CORICIDIN HBP FLU) 15-500-2 MG TABS; Take 2 tablets by mouth every 6 (six) hours for 5 days.  Dispense: 1 each; Refill: 0 - Guaifenesin (MUCINEX MAXIMUM STRENGTH) 1200 MG TB12; Take 1 tablet (1,200 mg total) by mouth 2 (two) times daily for 5 days.  Dispense: 10 tablet; Refill: 0  2. Eczema of right hand - triamcinolone ointment (KENALOG) 0.5 %; Apply 1 application topically 2 (two) times daily.  Dispense: 15 g; Refill: 0  3. Prediabetes - HgB A1c 6.0%   Meds ordered this encounter  Medications  . amoxicillin-clavulanate (AUGMENTIN) 875-125 MG tablet    Sig: Take 1 tablet by mouth 2 (two) times daily for 10 days.    Dispense:  20 tablet    Refill:  0    Order Specific Question:   Supervising Provider    Answer:   Charlott Rakes [[id number]]  . DM-APAP-CPM (CORICIDIN HBP FLU) 15-500-2 MG TABS    Sig: Take 2 tablets by mouth every 6 (six) hours for 5 days.    Dispense:  1 each    Refill:  0    Order Specific Question:   Supervising Provider    Answer:   Charlott Rakes [[id number]]  . Guaifenesin (MUCINEX MAXIMUM STRENGTH) 1200 MG TB12    Sig: Take 1 tablet (1,200 mg total) by mouth 2 (two) times daily for 5 days.    Dispense:  10 tablet    Refill:  0    Order Specific Question:   Supervising Provider    Answer:   Charlott Rakes [[id number]]  . triamcinolone ointment (KENALOG) 0.5 %    Sig: Apply 1 application topically 2 (two) times daily.    Dispense:  15 g    Refill:  0    Order Specific Question:   Supervising Provider    Answer:   Charlott Rakes [[id number]]    Follow-up: Return if symptoms worsen or fail to improve.   Clent Demark PA

## 2018-07-11 NOTE — Patient Instructions (Signed)

## 2018-07-27 ENCOUNTER — Ambulatory Visit (INDEPENDENT_AMBULATORY_CARE_PROVIDER_SITE_OTHER): Payer: Medicare Other | Admitting: *Deleted

## 2018-07-27 DIAGNOSIS — D225 Melanocytic nevi of trunk: Secondary | ICD-10-CM | POA: Diagnosis not present

## 2018-07-27 DIAGNOSIS — I428 Other cardiomyopathies: Secondary | ICD-10-CM

## 2018-07-27 DIAGNOSIS — L720 Epidermal cyst: Secondary | ICD-10-CM | POA: Diagnosis not present

## 2018-07-27 DIAGNOSIS — I5022 Chronic systolic (congestive) heart failure: Secondary | ICD-10-CM

## 2018-07-27 NOTE — Progress Notes (Signed)
Remote ICD transmission.   

## 2018-08-01 ENCOUNTER — Other Ambulatory Visit (HOSPITAL_COMMUNITY): Payer: Self-pay

## 2018-08-01 MED ORDER — ISOSORB DINITRATE-HYDRALAZINE 20-37.5 MG PO TABS: 2.0000 | ORAL_TABLET | Freq: Three times a day (TID) | ORAL | 3 refills | Status: DC

## 2018-08-04 ENCOUNTER — Encounter: Payer: Self-pay | Admitting: Cardiology

## 2018-08-12 ENCOUNTER — Other Ambulatory Visit (HOSPITAL_COMMUNITY): Payer: Self-pay | Admitting: Internal Medicine

## 2018-08-12 DIAGNOSIS — I428 Other cardiomyopathies: Secondary | ICD-10-CM

## 2018-08-31 ENCOUNTER — Other Ambulatory Visit (HOSPITAL_COMMUNITY): Payer: Self-pay

## 2018-08-31 DIAGNOSIS — E785 Hyperlipidemia, unspecified: Secondary | ICD-10-CM

## 2018-08-31 MED ORDER — ROSUVASTATIN CALCIUM 40 MG PO TABS: 40.0000 mg | ORAL_TABLET | Freq: Every day | ORAL | 3 refills | Status: DC

## 2018-09-26 LAB — CUP PACEART REMOTE DEVICE CHECK
Battery Voltage: 2.98 V
Brady Statistic RV Percent Paced: 1 %
Date Time Interrogation Session: 20191031060017
HighPow Impedance: 62 Ohm
HighPow Impedance: 62 Ohm
Implantable Lead Implant Date: 20161012
Implantable Lead Location: 753860
Implantable Lead Model: 7122
Lead Channel Pacing Threshold Amplitude: 0.75 V
Lead Channel Pacing Threshold Pulse Width: 0.5 ms
Lead Channel Sensing Intrinsic Amplitude: 11.8 mV
Lead Channel Setting Pacing Amplitude: 2.5 V
Lead Channel Setting Sensing Sensitivity: 0.5 mV
MDC IDC MSMT BATTERY REMAINING LONGEVITY: 78 mo
MDC IDC MSMT BATTERY REMAINING PERCENTAGE: 75 %
MDC IDC MSMT LEADCHNL RV IMPEDANCE VALUE: 450 Ohm
MDC IDC PG IMPLANT DT: 20161012
MDC IDC PG SERIAL: 7308640
MDC IDC SET LEADCHNL RV PACING PULSEWIDTH: 0.5 ms

## 2018-10-26 ENCOUNTER — Ambulatory Visit (INDEPENDENT_AMBULATORY_CARE_PROVIDER_SITE_OTHER): Payer: Medicare Other

## 2018-10-26 DIAGNOSIS — I428 Other cardiomyopathies: Secondary | ICD-10-CM | POA: Diagnosis not present

## 2018-10-27 LAB — CUP PACEART REMOTE DEVICE CHECK
Battery Remaining Longevity: 77 mo
Battery Remaining Percentage: 74 %
Brady Statistic RV Percent Paced: 1 %
HIGH POWER IMPEDANCE MEASURED VALUE: 65 Ohm
HighPow Impedance: 65 Ohm
Implantable Pulse Generator Implant Date: 20161012
Lead Channel Impedance Value: 490 Ohm
Lead Channel Pacing Threshold Pulse Width: 0.5 ms
Lead Channel Setting Pacing Pulse Width: 0.5 ms
Lead Channel Setting Sensing Sensitivity: 0.5 mV
MDC IDC LEAD IMPLANT DT: 20161012
MDC IDC LEAD LOCATION: 753860
MDC IDC MSMT BATTERY VOLTAGE: 2.98 V
MDC IDC MSMT LEADCHNL RV PACING THRESHOLD AMPLITUDE: 0.75 V
MDC IDC MSMT LEADCHNL RV SENSING INTR AMPL: 11.8 mV
MDC IDC SESS DTM: 20200130070017
MDC IDC SET LEADCHNL RV PACING AMPLITUDE: 2.5 V
Pulse Gen Serial Number: 7308640

## 2018-11-03 NOTE — Progress Notes (Signed)
Remote ICD transmission.   

## 2018-11-06 ENCOUNTER — Encounter: Payer: Self-pay | Admitting: Cardiology

## 2018-11-14 ENCOUNTER — Other Ambulatory Visit (HOSPITAL_COMMUNITY): Payer: Self-pay | Admitting: Internal Medicine

## 2018-11-14 DIAGNOSIS — I428 Other cardiomyopathies: Secondary | ICD-10-CM

## 2019-01-11 ENCOUNTER — Other Ambulatory Visit (HOSPITAL_COMMUNITY): Payer: Self-pay | Admitting: Surgery

## 2019-01-11 DIAGNOSIS — I428 Other cardiomyopathies: Secondary | ICD-10-CM

## 2019-01-11 MED ORDER — DIGOXIN 125 MCG PO TABS: ORAL_TABLET | ORAL | 5 refills | Status: DC

## 2019-01-11 NOTE — Telephone Encounter (Signed)
Patient called requesting a refill be sent for his Digoxin to his pharmacy of choice.  I have sent prescription.

## 2019-01-12 ENCOUNTER — Other Ambulatory Visit (HOSPITAL_COMMUNITY): Payer: Self-pay | Admitting: Surgery

## 2019-01-12 MED ORDER — CARVEDILOL 12.5 MG PO TABS: 18.7500 mg | ORAL_TABLET | Freq: Two times a day (BID) | ORAL | 3 refills | Status: DC

## 2019-01-12 NOTE — Telephone Encounter (Signed)
Mr. Siegmann called back today and requested a refill for Coreg.  I have sent the prescription to his specified pharmacy.

## 2019-01-16 ENCOUNTER — Other Ambulatory Visit (HOSPITAL_COMMUNITY): Payer: Self-pay | Admitting: Cardiology

## 2019-01-16 DIAGNOSIS — E785 Hyperlipidemia, unspecified: Secondary | ICD-10-CM

## 2019-01-25 ENCOUNTER — Other Ambulatory Visit: Payer: Self-pay

## 2019-01-25 ENCOUNTER — Ambulatory Visit (INDEPENDENT_AMBULATORY_CARE_PROVIDER_SITE_OTHER): Payer: Medicare Other | Admitting: *Deleted

## 2019-01-25 DIAGNOSIS — I428 Other cardiomyopathies: Secondary | ICD-10-CM

## 2019-01-25 LAB — CUP PACEART REMOTE DEVICE CHECK
Battery Remaining Longevity: 74 mo
Battery Remaining Percentage: 72 %
Brady Statistic RV Percent Paced: 1 %
Date Time Interrogation Session: 20200430133007
HighPow Impedance: 65 Ohm
Implantable Lead Implant Date: 20161012
Implantable Lead Location: 753860
Implantable Lead Model: 7122
Implantable Pulse Generator Implant Date: 20161012
Lead Channel Impedance Value: 510 Ohm
Lead Channel Sensing Intrinsic Amplitude: 11.8 mV
Lead Channel Setting Pacing Amplitude: 2.5 V
Lead Channel Setting Pacing Pulse Width: 0.5 ms
Lead Channel Setting Sensing Sensitivity: 0.5 mV
Pulse Gen Serial Number: 7308640

## 2019-02-02 ENCOUNTER — Encounter: Payer: Self-pay | Admitting: Cardiology

## 2019-02-02 NOTE — Progress Notes (Signed)
Remote ICD transmission.   

## 2019-02-12 ENCOUNTER — Other Ambulatory Visit (HOSPITAL_COMMUNITY): Payer: Self-pay | Admitting: Internal Medicine

## 2019-02-12 DIAGNOSIS — I5022 Chronic systolic (congestive) heart failure: Secondary | ICD-10-CM

## 2019-03-19 ENCOUNTER — Other Ambulatory Visit (HOSPITAL_COMMUNITY): Payer: Self-pay | Admitting: Cardiology

## 2019-04-26 ENCOUNTER — Ambulatory Visit (INDEPENDENT_AMBULATORY_CARE_PROVIDER_SITE_OTHER): Payer: Medicare Other | Admitting: *Deleted

## 2019-04-26 DIAGNOSIS — I428 Other cardiomyopathies: Secondary | ICD-10-CM | POA: Diagnosis not present

## 2019-04-27 LAB — CUP PACEART REMOTE DEVICE CHECK
Battery Remaining Longevity: 73 mo
Battery Remaining Percentage: 70 %
Battery Voltage: 2.96 V
Brady Statistic RV Percent Paced: 1 %
Date Time Interrogation Session: 20200730060016
HighPow Impedance: 62 Ohm
HighPow Impedance: 62 Ohm
Implantable Lead Implant Date: 20161012
Implantable Lead Location: 753860
Implantable Lead Model: 7122
Implantable Pulse Generator Implant Date: 20161012
Lead Channel Impedance Value: 490 Ohm
Lead Channel Pacing Threshold Amplitude: 0.75 V
Lead Channel Pacing Threshold Pulse Width: 0.5 ms
Lead Channel Sensing Intrinsic Amplitude: 12 mV
Lead Channel Setting Pacing Amplitude: 2.5 V
Lead Channel Setting Pacing Pulse Width: 0.5 ms
Lead Channel Setting Sensing Sensitivity: 0.5 mV
Pulse Gen Serial Number: 7308640

## 2019-05-04 ENCOUNTER — Encounter: Payer: Self-pay | Admitting: Cardiology

## 2019-05-04 NOTE — Progress Notes (Signed)
Remote ICD transmission.   

## 2019-06-22 ENCOUNTER — Other Ambulatory Visit (HOSPITAL_COMMUNITY): Payer: Self-pay | Admitting: Cardiology

## 2019-07-18 ENCOUNTER — Other Ambulatory Visit (HOSPITAL_COMMUNITY): Payer: Self-pay | Admitting: Cardiology

## 2019-07-26 ENCOUNTER — Ambulatory Visit (INDEPENDENT_AMBULATORY_CARE_PROVIDER_SITE_OTHER): Payer: Medicare Other | Admitting: *Deleted

## 2019-07-26 DIAGNOSIS — I428 Other cardiomyopathies: Secondary | ICD-10-CM | POA: Diagnosis not present

## 2019-07-26 DIAGNOSIS — I5022 Chronic systolic (congestive) heart failure: Secondary | ICD-10-CM

## 2019-07-26 LAB — CUP PACEART REMOTE DEVICE CHECK
Battery Remaining Longevity: 71 mo
Battery Remaining Percentage: 68 %
Battery Voltage: 2.96 V
Brady Statistic RV Percent Paced: 1 %
Date Time Interrogation Session: 20201029060017
HighPow Impedance: 62 Ohm
HighPow Impedance: 62 Ohm
Implantable Lead Implant Date: 20161012
Implantable Lead Location: 753860
Implantable Lead Model: 7122
Implantable Pulse Generator Implant Date: 20161012
Lead Channel Impedance Value: 490 Ohm
Lead Channel Pacing Threshold Amplitude: 0.75 V
Lead Channel Pacing Threshold Pulse Width: 0.5 ms
Lead Channel Sensing Intrinsic Amplitude: 11.8 mV
Lead Channel Setting Pacing Amplitude: 2.5 V
Lead Channel Setting Pacing Pulse Width: 0.5 ms
Lead Channel Setting Sensing Sensitivity: 0.5 mV
Pulse Gen Serial Number: 7308640

## 2019-08-18 NOTE — Progress Notes (Signed)
Remote ICD transmission.   

## 2019-08-27 ENCOUNTER — Other Ambulatory Visit: Payer: Self-pay

## 2019-08-27 DIAGNOSIS — I428 Other cardiomyopathies: Secondary | ICD-10-CM

## 2019-08-28 ENCOUNTER — Other Ambulatory Visit (HOSPITAL_COMMUNITY): Payer: Self-pay

## 2019-08-28 DIAGNOSIS — I428 Other cardiomyopathies: Secondary | ICD-10-CM

## 2019-08-28 MED ORDER — DIGOXIN 125 MCG PO TABS: ORAL_TABLET | ORAL | 5 refills | Status: DC

## 2019-09-07 ENCOUNTER — Emergency Department (HOSPITAL_BASED_OUTPATIENT_CLINIC_OR_DEPARTMENT_OTHER)
Admission: EM | Admit: 2019-09-07 | Discharge: 2019-09-07 | Disposition: A | Discharge status: Home / Self Care | Payer: Medicare Other | Attending: Emergency Medicine | Admitting: Emergency Medicine

## 2019-09-07 ENCOUNTER — Encounter (HOSPITAL_BASED_OUTPATIENT_CLINIC_OR_DEPARTMENT_OTHER): Payer: Self-pay

## 2019-09-07 ENCOUNTER — Other Ambulatory Visit: Payer: Self-pay

## 2019-09-07 ENCOUNTER — Emergency Department (HOSPITAL_BASED_OUTPATIENT_CLINIC_OR_DEPARTMENT_OTHER): Payer: Medicare Other

## 2019-09-07 DIAGNOSIS — Z7982 Long term (current) use of aspirin: Secondary | ICD-10-CM | POA: Diagnosis not present

## 2019-09-07 DIAGNOSIS — R42 Dizziness and giddiness: Secondary | ICD-10-CM | POA: Diagnosis not present

## 2019-09-07 DIAGNOSIS — Z79899 Other long term (current) drug therapy: Secondary | ICD-10-CM | POA: Insufficient documentation

## 2019-09-07 DIAGNOSIS — I110 Hypertensive heart disease with heart failure: Secondary | ICD-10-CM | POA: Diagnosis not present

## 2019-09-07 DIAGNOSIS — Z20822 Contact with and (suspected) exposure to covid-19: Secondary | ICD-10-CM

## 2019-09-07 DIAGNOSIS — I5022 Chronic systolic (congestive) heart failure: Secondary | ICD-10-CM | POA: Diagnosis not present

## 2019-09-07 DIAGNOSIS — I2510 Atherosclerotic heart disease of native coronary artery without angina pectoris: Secondary | ICD-10-CM | POA: Insufficient documentation

## 2019-09-07 DIAGNOSIS — Z7984 Long term (current) use of oral hypoglycemic drugs: Secondary | ICD-10-CM | POA: Diagnosis not present

## 2019-09-07 DIAGNOSIS — Z20828 Contact with and (suspected) exposure to other viral communicable diseases: Secondary | ICD-10-CM | POA: Diagnosis not present

## 2019-09-07 DIAGNOSIS — F17210 Nicotine dependence, cigarettes, uncomplicated: Secondary | ICD-10-CM | POA: Diagnosis not present

## 2019-09-07 LAB — COMPREHENSIVE METABOLIC PANEL
ALT: 28 U/L (ref 0–44)
AST: 29 U/L (ref 15–41)
Albumin: 4.1 g/dL (ref 3.5–5.0)
Alkaline Phosphatase: 90 U/L (ref 38–126)
Anion gap: 7 (ref 5–15)
BUN: 12 mg/dL (ref 8–23)
CO2: 27 mmol/L (ref 22–32)
Calcium: 9.1 mg/dL (ref 8.9–10.3)
Chloride: 104 mmol/L (ref 98–111)
Creatinine, Ser: 1.1 mg/dL (ref 0.61–1.24)
GFR calc Af Amer: >60 mL/min (ref >60)
GFR calc non Af Amer: >60 mL/min (ref >60)
Glucose, Bld: 87 mg/dL (ref 70–99)
Potassium: 3.9 mmol/L (ref 3.5–5.1)
Sodium: 138 mmol/L (ref 135–145)
Total Bilirubin: 0.6 mg/dL (ref 0.3–1.2)
Total Protein: 6.7 g/dL (ref 6.5–8.1)

## 2019-09-07 LAB — URINALYSIS, ROUTINE W REFLEX MICROSCOPIC
Bilirubin Urine: NEGATIVE
Glucose, UA: NEGATIVE mg/dL
Ketones, ur: NEGATIVE mg/dL
Leukocytes,Ua: NEGATIVE
Nitrite: NEGATIVE
Protein, ur: NEGATIVE mg/dL
Specific Gravity, Urine: 1.025 (ref 1.005–1.030)
pH: 6 (ref 5.0–8.0)

## 2019-09-07 LAB — URINALYSIS, MICROSCOPIC (REFLEX)

## 2019-09-07 LAB — DIGOXIN LEVEL: Digoxin Level: <0.2 ng/mL — ABNORMAL LOW (ref 0.8–2.0)

## 2019-09-07 LAB — CBC WITH DIFFERENTIAL/PLATELET
Abs Immature Granulocytes: 0.01 10*3/uL (ref 0.00–0.07)
Basophils Absolute: 0 10*3/uL (ref 0.0–0.1)
Basophils Relative: 1 %
Eosinophils Absolute: 0.1 10*3/uL (ref 0.0–0.5)
Eosinophils Relative: 3 %
HCT: 46.9 % (ref 39.0–52.0)
Hemoglobin: 14.9 g/dL (ref 13.0–17.0)
Immature Granulocytes: 0 %
Lymphocytes Relative: 37 %
Lymphs Abs: 2 10*3/uL (ref 0.7–4.0)
MCH: 29.4 pg (ref 26.0–34.0)
MCHC: 31.8 g/dL (ref 30.0–36.0)
MCV: 92.5 fL (ref 80.0–100.0)
Monocytes Absolute: 0.4 10*3/uL (ref 0.1–1.0)
Monocytes Relative: 8 %
Neutro Abs: 2.8 10*3/uL (ref 1.7–7.7)
Neutrophils Relative %: 51 %
Platelets: 158 10*3/uL (ref 150–400)
RBC: 5.07 MIL/uL (ref 4.22–5.81)
RDW: 14.6 % (ref 11.5–15.5)
WBC: 5.4 10*3/uL (ref 4.0–10.5)
nRBC: 0 % (ref 0.0–0.2)

## 2019-09-07 LAB — TROPONIN I (HIGH SENSITIVITY)
Troponin I (High Sensitivity): 18 ng/L — ABNORMAL HIGH (ref <18)
Troponin I (High Sensitivity): 17 ng/L (ref <18)

## 2019-09-07 NOTE — ED Notes (Signed)
Pt lying flat to check orthostatic vitals

## 2019-09-07 NOTE — ED Notes (Signed)
Call to [employer](St Jude) for pacer interrogation, left message, [phone], Richardson Landry is to return page.

## 2019-09-07 NOTE — ED Notes (Signed)
ED Provider at bedside. 

## 2019-09-07 NOTE — ED Triage Notes (Signed)
Weakness and blurry vision that began today, states vision stopped him from planned activities. Some dizziness. Denies LOC or injury. Hx of heart issues.

## 2019-09-07 NOTE — ED Notes (Signed)
Confirmed with [employer], interrogation successful, Dr Billy Fischer made aware, report to be faxed.

## 2019-09-07 NOTE — ED Provider Notes (Signed)
[facility] EMERGENCY DEPARTMENT Provider Note   CSN: [id number] Arrival date & time: 09/07/19  1554     History No chief complaint on file.   [name] [name] is a 63 y.o. male.  HPI     63 year old male with a history of nonischemic cardiomyopathy with chronic systolic congestive heart failure with AICD in place, coronary artery disease, hypertension, dyslipidemia presents with concern for generalized weakness and lightheadedness.  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  Reports sensation of generalized weakness, as if he is moving slowly, and feeling lightheaded.  Reports lightheadedness is worse when he goes from sitting to standing, and at that time he also develops a generalized blurring of his vision.  Denies double vision, missing pieces of vision, numbness, weakness, difficulty talking, difficulty walking, vertigo or facial droop.  No headache, nausea or vomiting.  Does acknowledge on review of systems that he has had some very mild chest pain over the last 2 days which has been for the most part constant, not necessarily worse with exertion, however is better when he goes to sleep.  He has a difficult time describing the quality of his pain, just reports that it has been very mild.  Denies any shortness of breath, cough, fever, nausea, vomiting, diarrhea, black stool.  Reports he noted a bit of bright red blood on tissue paper after having a bowel movement, but otherwise has not been having significant bleeding.  Denies any changes in medications.  Reports he has been taking his medications as prescribed.  He is not noted any lower extremity edema.  He has not had any shocks from his defibrillator or palpitations.  No known sick contacts or known contacts with Covid 19.         Past Medical History:  Diagnosis Date  . AICD (automatic cardioverter/defibrillator) present   . Chronic systolic  CHF (congestive heart failure) ([place]) 01/31/2015  . Coronary artery disease   . Hypertension   . Nonischemic cardiomyopathy ([place]) 06/30/2015    Patient Active Problem List   Diagnosis Date Noted  . NICM (nonischemic cardiomyopathy) ([place]) 07/09/2015  . Nonischemic cardiomyopathy ([place]) 06/30/2015  . Heavy smoker (more than 20 cigarettes per day) 05/27/2015  . PAD (peripheral artery disease) ([place]) 05/27/2015  . Chronic systolic CHF (congestive heart failure) ([place]) 01/31/2015  . Pedal edema 01/27/2015  . Blood in stool 11/06/2013  . Polycythemia, secondary 06/18/2013  . Nicotine dependence 06/18/2013  . Essential hypertension, benign 06/18/2013  . Dyslipidemia 06/18/2013  . Low back pain 06/18/2013  . Irregular heart rhythm 06/18/2013    Past Surgical History:  Procedure Laterality Date  . CARDIAC CATHETERIZATION N/A 02/04/2015   Procedure: Right/Left Heart Cath and Coronary Angiography;  Surgeon: Larey Dresser, MD;  Location: [facility];  Service: Cardiovascular;  Laterality: N/A;  . CARDIAC DEFIBRILLATOR PLACEMENT  07/09/2015  . CHALAZION EXCISION Left 01/16/2014   Procedure: MINOR EXCISION OF CHALAZION;  Surgeon: Myrtha Mantis., MD;  Location: [facility];  Service: Ophthalmology;  Laterality: Left;  . EP IMPLANTABLE DEVICE N/A 07/09/2015   Procedure: ICD Implant;  Surgeon: Deboraha Sprang, MD;  Location: [facility];  Service: Cardiovascular;  Laterality: N/A;  . EYE SURGERY Left ~ 2013   "eye infection"       Family History  Problem Relation Age of Onset  . Stroke Mother   .  Heart Problems Other     Social History   Tobacco Use  . Smoking status: Current Every Day Smoker    Packs/day: 1.00    Years: 40.00    Pack years: 40.00    Types: Cigarettes  . Smokeless tobacco: Never Used  Substance Use Topics  . Alcohol use: Yes    Comment: 07/09/2015 "I haven't had a drink since last of 12/2014"  . Drug use: Yes    Types: "Crack"  cocaine, Marijuana    Comment: 07/09/2015 "I did drugs a long time;ago  quit in the mid 1980's"    Home Medications Prior to Admission medications   Medication Sig Start Date End Date Taking? Authorizing Provider  aspirin 81 MG EC tablet TAKE 1 TABLET(81 MG) BY MOUTH DAILY. SWALLOW WHOLE 06/03/17   Larey Dresser, MD  BIDIL 20-37.5 MG tablet TAKE 2 TABLETS BY MOUTH THREE TIMES DAILY 07/18/19   Larey Dresser, MD  carvedilol (COREG) 12.5 MG tablet Take 1.5 tablets (18.75 mg total) by mouth 2 (two) times daily with a meal. 01/12/19   Bensimhon, Shaune Pascal, MD  digoxin (LANOXIN) 0.125 MG tablet TAKE 1 TABLET(125 MCG) BY MOUTH DAILY 08/28/19   Bensimhon, Shaune Pascal, MD  ENTRESTO 97-103 MG TAKE 1 TABLET BY MOUTH TWICE DAILY 06/22/19   Larey Dresser, MD  ezetimibe (ZETIA) 10 MG tablet TAKE 1 TABLET(10 MG) BY MOUTH DAILY 03/19/19   Larey Dresser, MD  metFORMIN (GLUCOPHAGE XR) 500 MG 24 hr tablet Take 1 tablet (500 mg total) by mouth daily with breakfast. 12/20/17   Clent Demark, PA-C  rosuvastatin (CRESTOR) 40 MG tablet TAKE 1 TABLET(40 MG) BY MOUTH DAILY 01/16/19   Larey Dresser, MD  spironolactone (ALDACTONE) 25 MG tablet TAKE 1 TABLET BY MOUTH DAILY 02/13/19   Larey Dresser, MD  triamcinolone ointment (KENALOG) 0.5 % Apply 1 application topically 2 (two) times daily. 07/11/18   Clent Demark, PA-C  varenicline (CHANTIX) 0.5 MG tablet Take 0.5 mg by mouth 2 (two) times daily.    [provider]    Allergies    Patient has no known allergies.  Review of Systems   Review of Systems  Constitutional: Positive for fatigue. Negative for fever.  HENT: Negative for congestion.   Eyes: Positive for visual disturbance (when feeling lightheaded).  Respiratory: Negative for cough and shortness of breath.   Cardiovascular: Positive for chest pain.  Gastrointestinal: Negative for abdominal pain, nausea and vomiting.  Genitourinary: Negative for dysuria.  Musculoskeletal: Negative  for back pain.  Skin: Negative for rash.  Neurological: Positive for light-headedness. Negative for dizziness, facial asymmetry, speech difficulty, weakness, numbness and headaches.    Physical Exam Updated Vital Signs BP (!) 145/82   Pulse (!) 53   Temp 98.2 F (36.8 C) (Oral)   Resp 17   Ht 6\' 1"  (1.854 m)   Wt 68 kg   SpO2 99%   BMI 19.79 kg/m   Physical Exam Vitals and nursing note reviewed.  Constitutional:      General: He is not in acute distress.    Appearance: He is well-developed. He is not diaphoretic.  HENT:     Head: Normocephalic and atraumatic.  Eyes:     General: No visual field deficit.    Conjunctiva/sclera: Conjunctivae normal.  Cardiovascular:     Rate and Rhythm: Normal rate and regular rhythm.     Heart sounds: Normal heart sounds. No murmur. No friction rub. No gallop.  Pulmonary:     Effort: Pulmonary effort is normal. No respiratory distress.     Breath sounds: Normal breath sounds. No wheezing or rales.  Abdominal:     General: There is no distension.     Palpations: Abdomen is soft.     Tenderness: There is no abdominal tenderness. There is no guarding.  Musculoskeletal:     Cervical back: Normal range of motion.  Skin:    General: Skin is warm and dry.  Neurological:     Mental Status: He is alert and oriented to person, place, and time.     GCS: GCS eye subscore is 4. GCS verbal subscore is 5. GCS motor subscore is 6.     Cranial Nerves: Cranial nerves are intact. No cranial nerve deficit, dysarthria or facial asymmetry.     Sensory: Sensation is intact. No sensory deficit.     Motor: Motor function is intact. No weakness or pronator drift.     Coordination: Coordination is intact. Coordination normal.     Gait: Gait is intact.     ED Results / Procedures / Treatments   Labs (all labs ordered are listed, but only abnormal results are displayed) Labs Reviewed  DIGOXIN LEVEL - Abnormal; Notable for the following components:       Result Value   Digoxin Level <0.2 (*)    All other components within normal limits  URINALYSIS, ROUTINE W REFLEX MICROSCOPIC - Abnormal; Notable for the following components:   Hgb urine dipstick TRACE (*)    All other components within normal limits  URINALYSIS, MICROSCOPIC (REFLEX) - Abnormal; Notable for the following components:   Bacteria, UA RARE (*)    All other components within normal limits  TROPONIN I (HIGH SENSITIVITY) - Abnormal; Notable for the following components:   Troponin I (High Sensitivity) 18 (*)    All other components within normal limits  SARS CORONAVIRUS 2 (TAT 6-24 HRS)  URINE CULTURE  CBC WITH DIFFERENTIAL/PLATELET  COMPREHENSIVE METABOLIC PANEL  TROPONIN I (HIGH SENSITIVITY)    EKG EKG Interpretation  Date/Time:  Friday September 07 2019 16:14:19 EST Ventricular Rate:  54 PR Interval:    QRS Duration: 118 QT Interval:  431 QTC Calculation: 409 R Axis:   -62 Text Interpretation: Sinus rhythm Prolonged PR interval Consider left atrial enlargement LAD, consider left anterior fascicular block Left ventricular hypertrophy Nonspecific T abnormalities, inferior leads Similar TW changes in inferior leads in comparison to prior Confirmed by Gareth Morgan [phone]) on 09/07/2019 4:37:35 PM   Radiology DG Chest Portable 1 View  Result Date: 09/07/2019 CLINICAL DATA:  Dizzy for several hours. EXAM: PORTABLE CHEST 1 VIEW COMPARISON:  07/10/2015 FINDINGS: Cardiac silhouette is normal in size. No mediastinal or hilar masses. No evidence of adenopathy. Clear lungs.  No pleural effusion or pneumothorax. Stable single lead left anterior chest wall pacemaker. Skeletal structures are grossly intact. IMPRESSION: No active disease. Electronically Signed   By: Lajean Manes M.D.   On: 09/07/2019 17:49    Procedures Procedures (including critical care time)  Medications Ordered in ED Medications - No data to display  ED Course  I have reviewed the triage vital signs  and the nursing notes.  Pertinent labs & imaging results that were available during my care of the patient were reviewed by me and considered in my medical decision making (see chart for details).    MDM Rules/Calculators/A&P       63 year old male with a history of nonischemic cardiomyopathy with chronic systolic  congestive heart failure with AICD in place, coronary artery disease, hypertension, dyslipidemia presents with concern for generalized weakness and lightheadedness.  Has normal neurologic exam, no signs of acute CVA, history of dizziness and visual changes described are orthostatic in nature. Normal sinus rhythm, normal vital signs on arrival.   Labs show no sign of anemia, no electrolyte abnormalities. No sign of acute infection.  Hx of CHF and initial troponin 18, repeat 17, doubt ACS. Not having typical chest pain, only notes mild discomfort on ROS, do not feel symptoms consistent with ACS. Defibrillator interrogated without signs of significant arrhythmia.  Possible dehydration contributing to symptoms. Feels improved in ED. COVID 19 testing sent given fatigue.Discussed reasons to return and recommend Cardiology follow up. Patient discharged in stable condition with understanding of reasons to return.   Final Clinical Impression(s) / ED Diagnoses Final diagnoses:  Lightheadedness  COVID-19 virus test result unknown    Rx / DC Orders ED Discharge Orders    None       Gareth Morgan, MD 09/08/19 1516

## 2019-09-08 LAB — SARS CORONAVIRUS 2 (TAT 6-24 HRS): SARS Coronavirus 2: NEGATIVE

## 2019-09-09 LAB — URINE CULTURE: Culture: <10000 — AB

## 2019-09-14 ENCOUNTER — Ambulatory Visit: Payer: Medicare Other | Admitting: Medical

## 2019-10-01 ENCOUNTER — Ambulatory Visit (HOSPITAL_COMMUNITY)
Admission: RE | Admit: 2019-10-01 | Discharge: 2019-10-01 | Disposition: A | Discharge status: Home / Self Care | Payer: Medicare Other | Source: Ambulatory Visit | Attending: Adult Health | Admitting: Adult Health

## 2019-10-01 ENCOUNTER — Other Ambulatory Visit (HOSPITAL_COMMUNITY): Payer: Self-pay

## 2019-10-01 ENCOUNTER — Other Ambulatory Visit: Payer: Self-pay

## 2019-10-01 ENCOUNTER — Encounter (HOSPITAL_COMMUNITY): Payer: Self-pay

## 2019-10-01 VITALS — BP 112/84 | HR 66 | Wt 150.6 lb

## 2019-10-01 DIAGNOSIS — Z8249 Family history of ischemic heart disease and other diseases of the circulatory system: Secondary | ICD-10-CM | POA: Insufficient documentation

## 2019-10-01 DIAGNOSIS — E785 Hyperlipidemia, unspecified: Secondary | ICD-10-CM | POA: Insufficient documentation

## 2019-10-01 DIAGNOSIS — F1010 Alcohol abuse, uncomplicated: Secondary | ICD-10-CM | POA: Insufficient documentation

## 2019-10-01 DIAGNOSIS — Z79899 Other long term (current) drug therapy: Secondary | ICD-10-CM | POA: Insufficient documentation

## 2019-10-01 DIAGNOSIS — Z823 Family history of stroke: Secondary | ICD-10-CM | POA: Diagnosis not present

## 2019-10-01 DIAGNOSIS — I739 Peripheral vascular disease, unspecified: Secondary | ICD-10-CM | POA: Insufficient documentation

## 2019-10-01 DIAGNOSIS — Z7982 Long term (current) use of aspirin: Secondary | ICD-10-CM | POA: Insufficient documentation

## 2019-10-01 DIAGNOSIS — Z7984 Long term (current) use of oral hypoglycemic drugs: Secondary | ICD-10-CM | POA: Insufficient documentation

## 2019-10-01 DIAGNOSIS — I110 Hypertensive heart disease with heart failure: Secondary | ICD-10-CM | POA: Insufficient documentation

## 2019-10-01 DIAGNOSIS — Z95810 Presence of automatic (implantable) cardiac defibrillator: Secondary | ICD-10-CM | POA: Diagnosis not present

## 2019-10-01 DIAGNOSIS — I2510 Atherosclerotic heart disease of native coronary artery without angina pectoris: Secondary | ICD-10-CM | POA: Diagnosis not present

## 2019-10-01 DIAGNOSIS — E119 Type 2 diabetes mellitus without complications: Secondary | ICD-10-CM | POA: Diagnosis not present

## 2019-10-01 DIAGNOSIS — I5022 Chronic systolic (congestive) heart failure: Secondary | ICD-10-CM

## 2019-10-01 DIAGNOSIS — I428 Other cardiomyopathies: Secondary | ICD-10-CM | POA: Diagnosis not present

## 2019-10-01 DIAGNOSIS — F17210 Nicotine dependence, cigarettes, uncomplicated: Secondary | ICD-10-CM | POA: Insufficient documentation

## 2019-10-01 DIAGNOSIS — Z720 Tobacco use: Secondary | ICD-10-CM

## 2019-10-01 LAB — BASIC METABOLIC PANEL
Anion gap: 7 (ref 5–15)
BUN: 14 mg/dL (ref 8–23)
CO2: 26 mmol/L (ref 22–32)
Calcium: 9.4 mg/dL (ref 8.9–10.3)
Chloride: 104 mmol/L (ref 98–111)
Creatinine, Ser: 0.94 mg/dL (ref 0.61–1.24)
GFR calc Af Amer: >60 mL/min (ref >60)
GFR calc non Af Amer: >60 mL/min (ref >60)
Glucose, Bld: 124 mg/dL — ABNORMAL HIGH (ref 70–99)
Potassium: 4.2 mmol/L (ref 3.5–5.1)
Sodium: 137 mmol/L (ref 135–145)

## 2019-10-01 LAB — DIGOXIN LEVEL: Digoxin Level: <0.2 ng/mL — ABNORMAL LOW (ref 0.8–2.0)

## 2019-10-01 LAB — CBC
HCT: 47.6 % (ref 39.0–52.0)
Hemoglobin: 15.4 g/dL (ref 13.0–17.0)
MCH: 30.1 pg (ref 26.0–34.0)
MCHC: 32.4 g/dL (ref 30.0–36.0)
MCV: 93.2 fL (ref 80.0–100.0)
Platelets: 198 10*3/uL (ref 150–400)
RBC: 5.11 MIL/uL (ref 4.22–5.81)
RDW: 15 % (ref 11.5–15.5)
WBC: 4.7 10*3/uL (ref 4.0–10.5)
nRBC: 0 % (ref 0.0–0.2)

## 2019-10-01 MED ORDER — EZETIMIBE 10 MG PO TABS: ORAL_TABLET | ORAL | 5 refills | Status: DC

## 2019-10-01 MED ORDER — CHANTIX STARTING MONTH PAK 0.5 MG X 11 & 1 MG X 42 PO TABS: ORAL_TABLET | ORAL | 0 refills | Status: DC

## 2019-10-01 NOTE — Progress Notes (Signed)
Patient ID: [name] [name], male   DOB: 09/17/1956, 64 y.o.   MRN: [id number]    Advanced Heart Failure Clinic Note   PCP: Dr Doreene Burke Primary Cardiologist: Dr Aundra Dubin EP: Dr. Caryl Comes   HPI: [name] [name] is a 64 y.o. with history of ETOH was found to have EF 10% by echo. He had exertional dyspnea and chest pain, taken [facility] 5/16 showing nonobstructive CAD, elevated filling pressures (but not marked elevation), and low cardiac index. He was admitted to optimize HF. He was diuresed with IV lasix and transitioned to po lasix. Overall he diuresed 9 pounds. He continued on carvedilol, lisinopril, bidil, and spironolactone. Since then, his HF meds have been titrated up. No longer on lisinopril. Now on Entresto. Has St Jude ICD, followed by Dr. Caryl Comes.   It has been well over 1 year since his last clinic visit. Not seen since 05/2018. At last OV, he reported he was no longer drinking ETOH and had cut back on cigarettes with the help of Chantix. Echo was repeated 05/2019 and LVEF was 35-40%.   Today in f/u, he reports he is doing well. He was recently seen in the ED on 09/07/19 w/ complaints of generalized weakness and lightheadedness. Covid negative. EKG showed sinus brady 54 bpm w/ 1st degree AVB and LVH. Hs Troponin normal x 2. UA negative. CBC unremarkable. CMP with slight bump in SCr at 1.10 (baseline ~0.9) but otherwise unremarkable. Defibrillator interrogated without signs of significant arrhythmia.  He was told he was dehydrated and did not require hospital admission. Was released home and and advised to f/u w/ PCP and cardiology.  He is feeling better. No further weakness or lightheaded. Denies CP. No exertional dyspnea, orthopnea, PND or LEE. No weight gain. Sleeps w/ 1 pillow. Reports full med compliance. No side effects w/ meds. Unfortunately, has picked up with smoking, smoking 1ppd. Requesting more chantix. Denies any recent ETOH use. No ICD shocks. Device interrogation shows no VF/VT. No  shocks. Impedence up above threshold.Euvolemic on exam. BP stable and well controlled.   St Jude device interrogation: no VF/VT. No shocks. Impedence up above threshold. Remaining capacity to ERI, 67%.   Labs (5/16): K 3.8, creatinine 0.94 => 1.19, HCT 48.4, LDL 121, HDL 50, digoxin 0.3, BNP 2343 => 367 Labs (6/16): K 4, creatinine 1.06 Labs (04/18/15): K 4.3 Creatinine 0.91 Labs (05/07/2015): K 4.5 Creatinine 0.88  Labs (10/16): K 4.6, creatinine 0.95, digoxin 0.5, TC 163 Labs (12/16) K 4.2, creatinine 0.90 Labs (2/17): K 3.8, creatinine 0.92 Labs (5/17): K 4, creatinine 0.92, digoxin 0.3 Labs (7/17): LFTs normal, LDL 70, HDL 34 Labs (8/17): digoxin 0.2, K 4.2, creatinine 0.88 Labs (1/18): digoxin 0.3, K 3.8, creatinine 0.88 Labs (12/18): K 4, creatinine 1.07, LDL 43, HDL 36 Labs (3/19): digoxin < 0.2, K 4, creatinine 0.97 Labs (12/20) SCr 1.10. K 3.9.   PMH: 1. Low back pain 2. HTN 3. Hyperlipidemia 4. H/o cocaine abuse: Not active. 5. ETOH abuse: 1 pint/day 6. CAD: Coronary CT angiogram in 9/09 showed possible significant obstructive disease in the circumflex. He did not have cardiac cath.  7. Cardiomyopathy: Echo (9/09) with EF 35-40%. Echo (5/16) with EF 10%, mild LV dilation, grade II diastolic dysfunction, mildly dilated RV with moderately decreased systolic function. LHC/RHC (5/16): Nonobstructive coronary disease, mean RA 10, PA 49/28, mean PCWP 25, CI 1.4.  Echo (9/16) with EF 20-25%, severe LV dilation. St Jude ICD.  - CPX 11/04/15: FVC 2.85 FEV1 1.98, MVV 89, Peak RER  0.94 (submaximal), peak VO2 13 (41.2% predicted), [id number] slope: 26.  This study was limited by hip pain.  - Echo 02/18/16 LVEF 25-30%, Grade 1 DD, Mild LAE, RV mildly reduced.  - Echo (7/18): EF 30-35%, mild RV dilation.  8. Active smoker 9. PAD: ABI L normal, R mildly decreased.  - ABIs (8/18): Normal.  10. Type II diabetes: Borderline.   SH: Lives in [place], works as Dealer, lives with son,  smokes 1 ppd, drank 1 pint/day liquor => quit 5/16, prior cocaine use.   FH: Brother with MI in his 21s, another brother with ?SCD, mother with CVA.   ROS: All systems reviewed and negative except as per HPI.   Current Outpatient Medications  Medication Sig Dispense Refill  . aspirin 81 MG EC tablet TAKE 1 TABLET(81 MG) BY MOUTH DAILY. SWALLOW WHOLE 30 tablet 11  . BIDIL 20-37.5 MG tablet TAKE 2 TABLETS BY MOUTH THREE TIMES DAILY 180 tablet 3  . carvedilol (COREG) 12.5 MG tablet Take 1.5 tablets (18.75 mg total) by mouth 2 (two) times daily with a meal. 270 tablet 3  . digoxin (LANOXIN) 0.125 MG tablet TAKE 1 TABLET(125 MCG) BY MOUTH DAILY 30 tablet 5  . ENTRESTO 97-103 MG TAKE 1 TABLET BY MOUTH TWICE DAILY 60 tablet 6  . ezetimibe (ZETIA) 10 MG tablet TAKE 1 TABLET(10 MG) BY MOUTH DAILY 30 tablet 5  . metFORMIN (GLUCOPHAGE XR) 500 MG 24 hr tablet Take 1 tablet (500 mg total) by mouth daily with breakfast. 90 tablet 3  . rosuvastatin (CRESTOR) 40 MG tablet TAKE 1 TABLET(40 MG) BY MOUTH DAILY 90 tablet 3  . spironolactone (ALDACTONE) 25 MG tablet TAKE 1 TABLET BY MOUTH DAILY 90 tablet 2  . triamcinolone ointment (KENALOG) 0.5 % Apply 1 application topically 2 (two) times daily. 15 g 0  . varenicline (CHANTIX) 0.5 MG tablet Take 0.5 mg by mouth 2 (two) times daily.     No current facility-administered medications for this visit.    There were no vitals filed for this visit.  Wt Readings from Last 3 Encounters:  09/07/19 68 kg (150 lb)  07/11/18 66.5 kg (146 lb 9.6 oz)  06/07/18 65.5 kg (144 lb 6 oz)     PHYSICAL EXAM: General:  Well, thin appearing AAM. No respiratory difficulty HEENT: normal Neck: supple. no JVD. Carotids 2+ bilat; no bruits. No lymphadenopathy or thyromegaly appreciated. Cor: PMI nondisplaced. Regular rate & rhythm. No rubs, gallops or murmurs. Lungs: clear Abdomen: soft, nontender, nondistended. No hepatosplenomegaly. No bruits or masses. Good bowel  sounds. Extremities: no cyanosis, clubbing, rash, edema Neuro: alert & oriented x 3, cranial nerves grossly intact. moves all 4 extremities w/o difficulty. Affect pleasant.   ASSESSMENT & PLAN: 1. Chronic Systolic HF: Nonischemic cardiomyopathy with low output per RHC/LHC in 5/16.  Echo (9/16) with EF 20-25%, severe LV dilation s/p St Jude ICD.  Possible ETOH cardiomyopathy.  CPX (2/17) was submaximal due to hip pain so hard to interpret. Echo in 5/17 with LVEF 25-30%. Echo in 7/18 showed EF 30-35%. Repeat Echo 05/2018 showed EF 35-40%.   - He is not volume overloaded on exam nor by CorVue (impedence up above reference curve), stable NYHA class II symptoms.  - Has not needed lasix. Volume stable w/ Entresto +spironolactone. We discussed daily wts today. Will notify office if > 3lb gain in 24 hrs or >5 lb in 1 week.  - Will check BMP today - Continue Entresto 97/103 bid - Continue Coreg 18.75 mg  bid. Unable to titrate due to sinus bradycardia.   - Continue spironolactone 25 mg daily.   - Continue Bidil 2 tabs tid.  - Continue Digoxin 0.125 daily. Check dig level today.   2. ETOH Abuse: Former heavy drinker, he has quit. Denies any recent use.  3. Current Smoker: Continues to smoke 1ppd. Had prior success w/ Chantix and he is requesting retrial. Will order starter pack, followed by continuing pack.  4. PAD: No significant claudication now.  ABIs in 8/18 were normal.  - Continued to encourage him to quit smoking.   - Continue ASA 81 and Crestor 40 daily.   5. Hyperlipidemia: Goal LDL < 70 with PAD - no recent FLP on file. Unfortunately he has eaten today. Will plan to check FLP at next appt. He is tolerating statin w/o SEs.  5. CAD: Nonobstructive on prior cath.  - denies anginal symptoms - continue ASA 81 mg daily + statin therapy w/ Crestor.   Followup in 3-4 months w/ Dr. Esmeralda Links, PA-C  10/01/2019

## 2019-10-01 NOTE — Patient Instructions (Signed)
Lab work done today. We will notify you of any abnormal lab work. No news is good news!  START Chantix starter pack. Please follow the directions on the pack.  Please follow up with the [facility] in 3-4 months.  At the [facility], you and your health needs are our priority. As part of our continuing mission to provide you with exceptional heart care, we have created designated Provider Care Teams. These Care Teams include your primary Cardiologist (physician) and Advanced Practice Providers (APPs- Physician Assistants and Nurse Practitioners) who all work together to provide you with the care you need, when you need it.   You may see any of the following providers on your designated Care Team at your next follow up: Marland Kitchen Dr Glori Bickers . Dr [name] . Darrick Grinder, NP . Lyda Jester, PA . Audry Riles, PharmD   Please be sure to bring in all your medications bottles to every appointment.

## 2019-10-17 ENCOUNTER — Other Ambulatory Visit: Payer: Self-pay

## 2019-10-25 ENCOUNTER — Ambulatory Visit (INDEPENDENT_AMBULATORY_CARE_PROVIDER_SITE_OTHER): Payer: Medicare Other | Admitting: *Deleted

## 2019-10-25 DIAGNOSIS — I428 Other cardiomyopathies: Secondary | ICD-10-CM

## 2019-10-25 LAB — CUP PACEART REMOTE DEVICE CHECK
Battery Remaining Longevity: 70 mo
Battery Remaining Percentage: 67 %
Battery Voltage: 2.96 V
Brady Statistic RV Percent Paced: 1 %
Date Time Interrogation Session: 20210128020016
HighPow Impedance: 65 Ohm
HighPow Impedance: 65 Ohm
Implantable Lead Implant Date: 20161012
Implantable Lead Location: 753860
Implantable Lead Model: 7122
Implantable Pulse Generator Implant Date: 20161012
Lead Channel Impedance Value: 490 Ohm
Lead Channel Pacing Threshold Amplitude: 0.75 V
Lead Channel Pacing Threshold Pulse Width: 0.5 ms
Lead Channel Sensing Intrinsic Amplitude: 12 mV
Lead Channel Setting Pacing Amplitude: 2.5 V
Lead Channel Setting Pacing Pulse Width: 0.5 ms
Lead Channel Setting Sensing Sensitivity: 0.5 mV
Pulse Gen Serial Number: 7308640

## 2019-10-25 NOTE — Progress Notes (Signed)
ICD Remote  

## 2019-11-06 ENCOUNTER — Other Ambulatory Visit (HOSPITAL_COMMUNITY): Payer: Self-pay

## 2019-11-06 DIAGNOSIS — I5022 Chronic systolic (congestive) heart failure: Secondary | ICD-10-CM

## 2019-11-06 MED ORDER — SPIRONOLACTONE 25 MG PO TABS: 25.0000 mg | ORAL_TABLET | Freq: Every day | ORAL | 2 refills | Status: DC

## 2019-11-21 ENCOUNTER — Other Ambulatory Visit (HOSPITAL_COMMUNITY): Payer: Self-pay

## 2019-11-21 MED ORDER — BIDIL 20-37.5 MG PO TABS: 2.0000 | ORAL_TABLET | Freq: Three times a day (TID) | ORAL | 11 refills | Status: DC

## 2019-12-14 ENCOUNTER — Other Ambulatory Visit (HOSPITAL_COMMUNITY): Payer: Self-pay | Admitting: Cardiology

## 2019-12-14 NOTE — Telephone Encounter (Signed)
This is a CHF pt 

## 2020-01-14 ENCOUNTER — Other Ambulatory Visit (HOSPITAL_COMMUNITY): Payer: Self-pay

## 2020-01-14 DIAGNOSIS — E785 Hyperlipidemia, unspecified: Secondary | ICD-10-CM

## 2020-01-14 MED ORDER — ROSUVASTATIN CALCIUM 40 MG PO TABS: ORAL_TABLET | ORAL | 3 refills | Status: DC

## 2020-01-14 MED ORDER — CARVEDILOL 12.5 MG PO TABS: 18.7500 mg | ORAL_TABLET | Freq: Two times a day (BID) | ORAL | 3 refills | Status: DC

## 2020-01-24 ENCOUNTER — Ambulatory Visit (INDEPENDENT_AMBULATORY_CARE_PROVIDER_SITE_OTHER): Payer: Medicare Other | Admitting: *Deleted

## 2020-01-24 DIAGNOSIS — I428 Other cardiomyopathies: Secondary | ICD-10-CM

## 2020-01-24 LAB — CUP PACEART REMOTE DEVICE CHECK
Battery Remaining Longevity: 67 mo
Battery Remaining Percentage: 64 %
Battery Voltage: 2.96 V
Brady Statistic RV Percent Paced: 1 %
Date Time Interrogation Session: 20210429020023
HighPow Impedance: 65 Ohm
HighPow Impedance: 65 Ohm
Implantable Lead Implant Date: 20161012
Implantable Lead Location: 753860
Implantable Lead Model: 7122
Implantable Pulse Generator Implant Date: 20161012
Lead Channel Impedance Value: 490 Ohm
Lead Channel Pacing Threshold Amplitude: 0.75 V
Lead Channel Pacing Threshold Pulse Width: 0.5 ms
Lead Channel Sensing Intrinsic Amplitude: 12 mV
Lead Channel Setting Pacing Amplitude: 2.5 V
Lead Channel Setting Pacing Pulse Width: 0.5 ms
Lead Channel Setting Sensing Sensitivity: 0.5 mV
Pulse Gen Serial Number: 7308640

## 2020-01-25 NOTE — Progress Notes (Signed)
ICD Remote  

## 2020-01-29 ENCOUNTER — Ambulatory Visit (HOSPITAL_COMMUNITY)
Admission: RE | Admit: 2020-01-29 | Discharge: 2020-01-29 | Disposition: A | Discharge status: Home / Self Care | Payer: Medicare Other | Source: Ambulatory Visit | Attending: Cardiology | Admitting: Cardiology

## 2020-01-29 ENCOUNTER — Encounter (HOSPITAL_COMMUNITY): Payer: Self-pay | Admitting: Cardiology

## 2020-01-29 ENCOUNTER — Other Ambulatory Visit: Payer: Self-pay

## 2020-01-29 VITALS — BP 94/62 | HR 72 | Wt 148.2 lb

## 2020-01-29 DIAGNOSIS — I110 Hypertensive heart disease with heart failure: Secondary | ICD-10-CM | POA: Insufficient documentation

## 2020-01-29 DIAGNOSIS — F1010 Alcohol abuse, uncomplicated: Secondary | ICD-10-CM | POA: Insufficient documentation

## 2020-01-29 DIAGNOSIS — F17210 Nicotine dependence, cigarettes, uncomplicated: Secondary | ICD-10-CM | POA: Insufficient documentation

## 2020-01-29 DIAGNOSIS — Z79899 Other long term (current) drug therapy: Secondary | ICD-10-CM | POA: Diagnosis not present

## 2020-01-29 DIAGNOSIS — Z8249 Family history of ischemic heart disease and other diseases of the circulatory system: Secondary | ICD-10-CM | POA: Insufficient documentation

## 2020-01-29 DIAGNOSIS — E119 Type 2 diabetes mellitus without complications: Secondary | ICD-10-CM | POA: Insufficient documentation

## 2020-01-29 DIAGNOSIS — E785 Hyperlipidemia, unspecified: Secondary | ICD-10-CM | POA: Diagnosis not present

## 2020-01-29 DIAGNOSIS — Z7982 Long term (current) use of aspirin: Secondary | ICD-10-CM | POA: Insufficient documentation

## 2020-01-29 DIAGNOSIS — Z823 Family history of stroke: Secondary | ICD-10-CM | POA: Insufficient documentation

## 2020-01-29 DIAGNOSIS — I5022 Chronic systolic (congestive) heart failure: Secondary | ICD-10-CM | POA: Diagnosis not present

## 2020-01-29 DIAGNOSIS — I2510 Atherosclerotic heart disease of native coronary artery without angina pectoris: Secondary | ICD-10-CM | POA: Insufficient documentation

## 2020-01-29 DIAGNOSIS — I428 Other cardiomyopathies: Secondary | ICD-10-CM | POA: Diagnosis not present

## 2020-01-29 DIAGNOSIS — Z7984 Long term (current) use of oral hypoglycemic drugs: Secondary | ICD-10-CM | POA: Insufficient documentation

## 2020-01-29 LAB — LIPID PANEL
Cholesterol: 186 mg/dL (ref 0–200)
HDL: 64 mg/dL (ref >40)
LDL Cholesterol: 110 mg/dL — ABNORMAL HIGH (ref 0–99)
Total CHOL/HDL Ratio: 2.9 RATIO
Triglycerides: 60 mg/dL (ref <150)
VLDL: 12 mg/dL (ref 0–40)

## 2020-01-29 LAB — BASIC METABOLIC PANEL
Anion gap: 12 (ref 5–15)
BUN: 13 mg/dL (ref 8–23)
CO2: 25 mmol/L (ref 22–32)
Calcium: 9.3 mg/dL (ref 8.9–10.3)
Chloride: 103 mmol/L (ref 98–111)
Creatinine, Ser: 0.86 mg/dL (ref 0.61–1.24)
GFR calc Af Amer: >60 mL/min (ref >60)
GFR calc non Af Amer: >60 mL/min (ref >60)
Glucose, Bld: 137 mg/dL — ABNORMAL HIGH (ref 70–99)
Potassium: 3.7 mmol/L (ref 3.5–5.1)
Sodium: 140 mmol/L (ref 135–145)

## 2020-01-29 LAB — DIGOXIN LEVEL: Digoxin Level: <0.2 ng/mL — ABNORMAL LOW (ref 0.8–2.0)

## 2020-01-29 MED ORDER — VARENICLINE TARTRATE 1 MG PO TABS: ORAL_TABLET | ORAL | 2 refills | Status: DC

## 2020-01-29 NOTE — Progress Notes (Signed)
Patient ID: [name] [name], male   DOB: 02/20/1956, 64 y.o.   MRN: [id number]    Advanced Heart Failure Clinic Note   PCP: Dr Doreene Burke Primary Cardiologist: Dr Aundra Dubin  HPI: [name] [name] is a 64 y.o. with history of ETOH was found to have EF 10% by echo. He had exertional dyspnea and chest pain, taken for [facility] 5/16 showing nonobstructive CAD, elevated filling pressures (but not marked elevation), and low cardiac index. He was admitted to optimize HF. He was diuresed with IV lasix and transitioned to po lasix. Overall he diuresed 9 pounds. He continued on carvedilol, lisinopril, bidil, and spironolactone. Since then, his HF meds have been titrated up.   Most recent echo in 9/19 with EF 35-40%.    [name] Tiscareno returns today for followup of CHF.  He smokes, now less than 1 ppd, but rarely drinks ETOH.  He is using Chantix and has cut back smoking some.  No lightheadedness or dizziness though SBP in 90s this morning.  No significant dyspnea walking on flat ground though not particularly active.  No chest pain.  No claudication.   St Jude device interrogation: stable thoracic impedance, not VT  ECG (personally reviewed): NSR, LVH  Labs (5/16): K 3.8, creatinine 0.94 => 1.19, HCT 48.4, LDL 121, HDL 50, digoxin 0.3, BNP 2343 => 367 Labs (6/16): K 4, creatinine 1.06 Labs (04/18/15): K 4.3 Creatinine 0.91 Labs (05/07/2015): K 4.5 Creatinine 0.88  Labs (10/16): K 4.6, creatinine 0.95, digoxin 0.5, TC 163 Labs (12/16) K 4.2, creatinine 0.90 Labs (2/17): K 3.8, creatinine 0.92 Labs (5/17): K 4, creatinine 0.92, digoxin 0.3 Labs (7/17): LFTs normal, LDL 70, HDL 34 Labs (8/17): digoxin 0.2, K 4.2, creatinine 0.88 Labs (1/18): digoxin 0.3, K 3.8, creatinine 0.88 Labs (12/18): K 4, creatinine 1.07, LDL 43, HDL 36 Labs (3/19): digoxin < 0.2, K 4, creatinine 0.97 Labs (1/21): hgb 15.4, digoxin < 0.2, K 4.2, 0.94  PMH: 1. Low back pain 2. HTN 3. Hyperlipidemia 4. H/o cocaine abuse: Not active. 5.  ETOH abuse: 1 pint/day but has not cut back.  6. CAD: Coronary CT angiogram in 9/09 showed possible significant obstructive disease in the circumflex.  - LHC (5/16): Nonobstructive CAD.  7. Cardiomyopathy: Echo (9/09) with EF 35-40%. Echo (5/16) with EF 10%, mild LV dilation, grade II diastolic dysfunction, mildly dilated RV with moderately decreased systolic function. LHC/RHC (5/16): Nonobstructive coronary disease, mean RA 10, PA 49/28, mean PCWP 25, CI 1.4.  Echo (9/16) with EF 20-25%, severe LV dilation. St Jude ICD.  - CPX 11/04/15: FVC 2.85 FEV1 1.98, MVV 89, Peak RER 0.94 (submaximal), peak VO2 13 (41.2% predicted), [id number] slope: 26.  This study was limited by hip pain.  - Echo 02/18/16 LVEF 25-30%, Grade 1 DD, Mild LAE, RV mildly reduced.  - Echo (7/18): EF 30-35%, mild RV dilation.  - Echo (9/19): EF 35-40%.  8. Active smoker 9. PAD: ABI L normal, R mildly decreased.  - ABIs (8/18): Normal.  10. Type II diabetes: Borderline.   SH: Lives in [place], works as Dealer, lives with son, smokes 1 ppd, drank 1 pint/day liquor => quit 5/16, prior cocaine use.   FH: Brother with MI in his 56s, another brother with ?SCD, mother with CVA.   ROS: All systems reviewed and negative except as per HPI.   Current Outpatient Medications  Medication Sig Dispense Refill  . aspirin 81 MG EC tablet TAKE 1 TABLET(81 MG) BY MOUTH DAILY. SWALLOW WHOLE 30 tablet 11  .  carvedilol (COREG) 12.5 MG tablet Take 1.5 tablets (18.75 mg total) by mouth 2 (two) times daily with a meal. 270 tablet 3  . digoxin (LANOXIN) 0.125 MG tablet TAKE 1 TABLET(125 MCG) BY MOUTH DAILY 30 tablet 5  . ENTRESTO 97-103 MG TAKE 1 TABLET BY MOUTH TWICE DAILY 60 tablet 6  . ezetimibe (ZETIA) 10 MG tablet TAKE 1 TABLET(10 MG) BY MOUTH DAILY 30 tablet 5  . isosorbide-hydrALAZINE (BIDIL) 20-37.5 MG tablet Take 2 tablets by mouth 3 (three) times daily. 180 tablet 11  . metFORMIN (GLUCOPHAGE XR) 500 MG 24 hr tablet Take 1 tablet (500  mg total) by mouth daily with breakfast. 90 tablet 3  . rosuvastatin (CRESTOR) 40 MG tablet TAKE 1 TABLET(40 MG) BY MOUTH DAILY 90 tablet 3  . spironolactone (ALDACTONE) 25 MG tablet Take 1 tablet (25 mg total) by mouth daily. 90 tablet 2  . triamcinolone ointment (KENALOG) 0.5 % Apply 1 application topically 2 (two) times daily. 15 g 0  . varenicline (CHANTIX) 1 MG tablet Day 1-3 take 0.5mg  daily, Day 4-7 take 0.5mg  twice a day, Day 8 and after take 1mg  twice a day 60 tablet 2   No current facility-administered medications for this encounter.    Vitals:   01/29/20 1002  BP: 94/62  Pulse: 72  SpO2: 98%  Weight: 67.2 kg (148 lb 3.2 oz)    Wt Readings from Last 3 Encounters:  01/29/20 67.2 kg (148 lb 3.2 oz)  10/01/19 68.3 kg (150 lb 9.6 oz)  09/07/19 68 kg (150 lb)     PHYSICAL EXAM: General: NAD Neck: No JVD, no thyromegaly or thyroid nodule.  Lungs: Clear to auscultation bilaterally with normal respiratory effort. CV: Nondisplaced PMI.  Heart regular S1/S2, no S3/S4, no murmur.  No peripheral edema.  No carotid bruit.  Unable to palpate pedal pulses.  Abdomen: Soft, nontender, no hepatosplenomegaly, no distention.  Skin: Intact without lesions or rashes.  Neurologic: Alert and oriented x 3.  Psych: Normal affect. Extremities: No clubbing or cyanosis.  HEENT: Normal.   ASSESSMENT & PLAN: 1. Chronic Systolic HF: Nonischemic cardiomyopathy with low output per RHC/LHC in 5/16.  Echo (9/16) with EF 20-25%, severe LV dilation s/p St Jude ICD.  Possible ETOH cardiomyopathy. Limited literacy.  Volume status stable on exam.  CPX (2/17) was submaximal due to hip pain so hard to interpret. Echo in 5/17 with LVEF 25-30%. Echo in 7/18 showed EF 30-35%. Echo in 9/19 showed EF 35-40%. He is not volume overloaded on exam, NYHA class II symptoms.  - Has not needed lasix.  Continue to use only as needed for weight gains of 3 lbs over night or 5 lbs in a weeks time.  - Continue Coreg 18.75 mg  bid, will not increase with soft BP.   - Continue spironolactone 25 mg daily.  BMET today.  - Continue Bidil 2 tabs tid.  - Continue Entresto 97/103 bid.   - Continue Digoxin 0.125 daily. Check level today.   - I will arrange repeat echo.  2. ETOH Abuse: Former heavy drinker, rare ETOH now.  3. Current Smoker: Continues to smoke, willing to try Chantix again.  Will give prescription.  4. PAD: No significant claudication now.  ABIs in 8/18 were normal.  - Continued to encourage him to quit smoking.   - Continue ASA 81 and Crestor 40 daily.   5. Hyperlipidemia: Goal LDL < 70 with PAD - Check lipids.  5. CAD: Nonobstructive on prior cath.  BMET 3 months, see me 6 months.   [name] 01/29/2020

## 2020-01-29 NOTE — Patient Instructions (Addendum)
START Chantix as ordered (for 12 weeks) Day 1-3 Take 0.5mg  (1/2 tab) daily Day 4-7 Take 0.5mg  (1/2 tab) twice a day Day 8 and so on tale 1mg  (1 tab) twice a day    Labs today and repeat in 3 months We will only contact you if something comes back abnormal or we need to make some changes. Otherwise no news is good news!   Your physician has requested that you have an echocardiogram. Echocardiography is a painless test that uses sound waves to create images of your heart. It provides your doctor with information about the size and shape of your heart and how well your heart's chambers and valves are working. This procedure takes approximately one hour. There are no restrictions for this procedure.   Your physician wants you to follow-up in: 6 months. You will receive a reminder letter in the mail two months in advance. If you don't receive a letter, please call our office to schedule the follow-up appointment.   Please call office at [phone] option 2 if you have any questions or concerns.   At the [facility], you and your health needs are our priority. As part of our continuing mission to provide you with exceptional heart care, we have created designated Provider Care Teams. These Care Teams include your primary Cardiologist (physician) and Advanced Practice Providers (APPs- Physician Assistants and Nurse Practitioners) who all work together to provide you with the care you need, when you need it.   You may see any of the following providers on your designated Care Team at your next follow up: Marland Kitchen Dr Glori Bickers . Dr [name] . Darrick Grinder, NP . Lyda Jester, PA . Audry Riles, PharmD   Please be sure to bring in all your medications bottles to every appointment.

## 2020-02-05 ENCOUNTER — Other Ambulatory Visit: Payer: Self-pay

## 2020-02-05 ENCOUNTER — Ambulatory Visit (HOSPITAL_COMMUNITY)
Admission: RE | Admit: 2020-02-05 | Discharge: 2020-02-05 | Disposition: A | Discharge status: Home / Self Care | Payer: Medicare Other | Source: Ambulatory Visit | Attending: Cardiology | Admitting: Cardiology

## 2020-02-05 DIAGNOSIS — I429 Cardiomyopathy, unspecified: Secondary | ICD-10-CM | POA: Insufficient documentation

## 2020-02-05 DIAGNOSIS — I5022 Chronic systolic (congestive) heart failure: Secondary | ICD-10-CM | POA: Insufficient documentation

## 2020-02-05 DIAGNOSIS — F17200 Nicotine dependence, unspecified, uncomplicated: Secondary | ICD-10-CM | POA: Insufficient documentation

## 2020-02-05 DIAGNOSIS — Z95810 Presence of automatic (implantable) cardiac defibrillator: Secondary | ICD-10-CM | POA: Diagnosis not present

## 2020-02-05 DIAGNOSIS — I358 Other nonrheumatic aortic valve disorders: Secondary | ICD-10-CM | POA: Insufficient documentation

## 2020-02-05 DIAGNOSIS — I110 Hypertensive heart disease with heart failure: Secondary | ICD-10-CM | POA: Insufficient documentation

## 2020-02-05 NOTE — Progress Notes (Signed)
  Echocardiogram 2D Echocardiogram has been performed.  [name] [name] 02/05/2020, 9:46 AM

## 2020-03-04 ENCOUNTER — Telehealth (HOSPITAL_COMMUNITY): Payer: Self-pay | Admitting: Cardiology

## 2020-03-04 NOTE — Telephone Encounter (Signed)
Called patient to reschedule lab visit appt on 04/30/20.  VM was full; will call back later.

## 2020-03-14 ENCOUNTER — Other Ambulatory Visit (HOSPITAL_COMMUNITY): Payer: Self-pay

## 2020-03-14 MED ORDER — EZETIMIBE 10 MG PO TABS: 10.0000 mg | ORAL_TABLET | Freq: Every day | ORAL | 3 refills | Status: DC

## 2020-04-07 ENCOUNTER — Other Ambulatory Visit: Payer: Self-pay

## 2020-04-24 ENCOUNTER — Ambulatory Visit (INDEPENDENT_AMBULATORY_CARE_PROVIDER_SITE_OTHER): Payer: Medicare Other | Admitting: *Deleted

## 2020-04-24 DIAGNOSIS — I428 Other cardiomyopathies: Secondary | ICD-10-CM

## 2020-04-24 DIAGNOSIS — I5022 Chronic systolic (congestive) heart failure: Secondary | ICD-10-CM

## 2020-04-24 LAB — CUP PACEART REMOTE DEVICE CHECK
Battery Remaining Longevity: 65 mo
Battery Remaining Percentage: 63 %
Battery Voltage: 2.96 V
Brady Statistic RV Percent Paced: 1 %
Date Time Interrogation Session: 20210729020017
HighPow Impedance: 62 Ohm
HighPow Impedance: 62 Ohm
Implantable Lead Implant Date: 20161012
Implantable Lead Location: 753860
Implantable Lead Model: 7122
Implantable Pulse Generator Implant Date: 20161012
Lead Channel Impedance Value: 430 Ohm
Lead Channel Pacing Threshold Amplitude: 0.75 V
Lead Channel Pacing Threshold Pulse Width: 0.5 ms
Lead Channel Sensing Intrinsic Amplitude: 12 mV
Lead Channel Setting Pacing Amplitude: 2.5 V
Lead Channel Setting Pacing Pulse Width: 0.5 ms
Lead Channel Setting Sensing Sensitivity: 0.5 mV
Pulse Gen Serial Number: 7308640

## 2020-04-28 NOTE — Progress Notes (Signed)
Remote ICD transmission.   

## 2020-04-30 ENCOUNTER — Other Ambulatory Visit (HOSPITAL_COMMUNITY): Payer: Medicare Other

## 2020-05-05 ENCOUNTER — Other Ambulatory Visit (HOSPITAL_COMMUNITY): Payer: Self-pay

## 2020-05-05 DIAGNOSIS — I428 Other cardiomyopathies: Secondary | ICD-10-CM

## 2020-05-05 MED ORDER — DIGOXIN 125 MCG PO TABS: ORAL_TABLET | ORAL | 11 refills | Status: DC

## 2020-05-06 ENCOUNTER — Ambulatory Visit (HOSPITAL_COMMUNITY)
Admission: RE | Admit: 2020-05-06 | Discharge: 2020-05-06 | Disposition: A | Discharge status: Home / Self Care | Payer: Medicare Other | Source: Ambulatory Visit | Attending: Cardiology | Admitting: Cardiology

## 2020-05-06 ENCOUNTER — Other Ambulatory Visit: Payer: Self-pay

## 2020-05-06 DIAGNOSIS — I5022 Chronic systolic (congestive) heart failure: Secondary | ICD-10-CM | POA: Diagnosis present

## 2020-05-06 LAB — BASIC METABOLIC PANEL
Anion gap: 9 (ref 5–15)
BUN: 13 mg/dL (ref 8–23)
CO2: 25 mmol/L (ref 22–32)
Calcium: 9.2 mg/dL (ref 8.9–10.3)
Chloride: 106 mmol/L (ref 98–111)
Creatinine, Ser: 0.89 mg/dL (ref 0.61–1.24)
GFR calc Af Amer: >60 mL/min (ref >60)
GFR calc non Af Amer: >60 mL/min (ref >60)
Glucose, Bld: 99 mg/dL (ref 70–99)
Potassium: 3.7 mmol/L (ref 3.5–5.1)
Sodium: 140 mmol/L (ref 135–145)

## 2020-06-05 ENCOUNTER — Other Ambulatory Visit (HOSPITAL_COMMUNITY): Payer: Self-pay | Admitting: Cardiology

## 2020-06-05 DIAGNOSIS — I5022 Chronic systolic (congestive) heart failure: Secondary | ICD-10-CM

## 2020-07-05 ENCOUNTER — Other Ambulatory Visit (HOSPITAL_COMMUNITY): Payer: Self-pay | Admitting: Cardiology

## 2020-07-05 ENCOUNTER — Other Ambulatory Visit (HOSPITAL_COMMUNITY): Payer: Self-pay | Admitting: Internal Medicine

## 2020-07-05 DIAGNOSIS — I428 Other cardiomyopathies: Secondary | ICD-10-CM

## 2020-07-24 ENCOUNTER — Ambulatory Visit (INDEPENDENT_AMBULATORY_CARE_PROVIDER_SITE_OTHER): Payer: Medicare Other

## 2020-07-24 DIAGNOSIS — I428 Other cardiomyopathies: Secondary | ICD-10-CM | POA: Diagnosis not present

## 2020-07-24 LAB — CUP PACEART REMOTE DEVICE CHECK
Battery Remaining Longevity: 62 mo
Battery Remaining Percentage: 61 %
Battery Voltage: 2.95 V
Brady Statistic RV Percent Paced: 1 %
Date Time Interrogation Session: 20211028020035
HighPow Impedance: 65 Ohm
HighPow Impedance: 65 Ohm
Implantable Lead Implant Date: 20161012
Implantable Lead Location: 753860
Implantable Lead Model: 7122
Implantable Pulse Generator Implant Date: 20161012
Lead Channel Impedance Value: 450 Ohm
Lead Channel Pacing Threshold Amplitude: 0.75 V
Lead Channel Pacing Threshold Pulse Width: 0.5 ms
Lead Channel Sensing Intrinsic Amplitude: 12 mV
Lead Channel Setting Pacing Amplitude: 2.5 V
Lead Channel Setting Pacing Pulse Width: 0.5 ms
Lead Channel Setting Sensing Sensitivity: 0.5 mV
Pulse Gen Serial Number: 7308640

## 2020-07-29 NOTE — Progress Notes (Signed)
Remote ICD transmission.   

## 2020-10-23 ENCOUNTER — Ambulatory Visit (INDEPENDENT_AMBULATORY_CARE_PROVIDER_SITE_OTHER): Payer: Medicare Other

## 2020-10-23 DIAGNOSIS — I428 Other cardiomyopathies: Secondary | ICD-10-CM | POA: Diagnosis not present

## 2020-10-24 LAB — CUP PACEART REMOTE DEVICE CHECK
Battery Remaining Longevity: 61 mo
Battery Remaining Percentage: 59 %
Battery Voltage: 2.95 V
Brady Statistic RV Percent Paced: 1 %
Date Time Interrogation Session: 20220127030245
HighPow Impedance: 64 Ohm
HighPow Impedance: 64 Ohm
Implantable Lead Implant Date: 20161012
Implantable Lead Location: 753860
Implantable Lead Model: 7122
Implantable Pulse Generator Implant Date: 20161012
Lead Channel Impedance Value: 450 Ohm
Lead Channel Pacing Threshold Amplitude: 0.75 V
Lead Channel Pacing Threshold Pulse Width: 0.5 ms
Lead Channel Sensing Intrinsic Amplitude: 12 mV
Lead Channel Setting Pacing Amplitude: 2.5 V
Lead Channel Setting Pacing Pulse Width: 0.5 ms
Lead Channel Setting Sensing Sensitivity: 0.5 mV
Pulse Gen Serial Number: 7308640

## 2020-11-03 NOTE — Progress Notes (Signed)
Remote ICD transmission.   

## 2021-01-22 ENCOUNTER — Ambulatory Visit (INDEPENDENT_AMBULATORY_CARE_PROVIDER_SITE_OTHER): Payer: Medicare Other

## 2021-01-22 DIAGNOSIS — I428 Other cardiomyopathies: Secondary | ICD-10-CM

## 2021-01-22 LAB — CUP PACEART REMOTE DEVICE CHECK
Battery Remaining Longevity: 59 mo
Battery Remaining Percentage: 57 %
Battery Voltage: 2.95 V
Brady Statistic RV Percent Paced: 1 %
Date Time Interrogation Session: 20220428023524
HighPow Impedance: 71 Ohm
HighPow Impedance: 71 Ohm
Implantable Lead Implant Date: 20161012
Implantable Lead Location: 753860
Implantable Lead Model: 7122
Implantable Pulse Generator Implant Date: 20161012
Lead Channel Impedance Value: 490 Ohm
Lead Channel Pacing Threshold Amplitude: 0.75 V
Lead Channel Pacing Threshold Pulse Width: 0.5 ms
Lead Channel Sensing Intrinsic Amplitude: 12 mV
Lead Channel Setting Pacing Amplitude: 2.5 V
Lead Channel Setting Pacing Pulse Width: 0.5 ms
Lead Channel Setting Sensing Sensitivity: 0.5 mV
Pulse Gen Serial Number: 7308640

## 2021-02-12 NOTE — Progress Notes (Signed)
Remote ICD transmission.   

## 2021-02-20 ENCOUNTER — Other Ambulatory Visit (HOSPITAL_COMMUNITY): Payer: Self-pay

## 2021-02-20 DIAGNOSIS — E785 Hyperlipidemia, unspecified: Secondary | ICD-10-CM

## 2021-02-20 MED ORDER — ROSUVASTATIN CALCIUM 40 MG PO TABS: ORAL_TABLET | ORAL | 0 refills | Status: DC

## 2021-04-23 ENCOUNTER — Ambulatory Visit (INDEPENDENT_AMBULATORY_CARE_PROVIDER_SITE_OTHER): Payer: Medicare Other

## 2021-04-23 ENCOUNTER — Other Ambulatory Visit: Payer: Self-pay

## 2021-04-23 ENCOUNTER — Encounter (HOSPITAL_COMMUNITY): Payer: Self-pay | Admitting: Cardiology

## 2021-04-23 ENCOUNTER — Ambulatory Visit (HOSPITAL_COMMUNITY)
Admission: RE | Admit: 2021-04-23 | Discharge: 2021-04-23 | Disposition: A | Discharge status: Home / Self Care | Payer: Medicare Other | Source: Ambulatory Visit | Attending: Cardiology | Admitting: Cardiology

## 2021-04-23 VITALS — BP 122/80 | HR 88 | Wt 150.4 lb

## 2021-04-23 DIAGNOSIS — I739 Peripheral vascular disease, unspecified: Secondary | ICD-10-CM | POA: Diagnosis not present

## 2021-04-23 DIAGNOSIS — E785 Hyperlipidemia, unspecified: Secondary | ICD-10-CM | POA: Diagnosis not present

## 2021-04-23 DIAGNOSIS — Z79899 Other long term (current) drug therapy: Secondary | ICD-10-CM | POA: Diagnosis not present

## 2021-04-23 DIAGNOSIS — Z7984 Long term (current) use of oral hypoglycemic drugs: Secondary | ICD-10-CM | POA: Diagnosis not present

## 2021-04-23 DIAGNOSIS — F1010 Alcohol abuse, uncomplicated: Secondary | ICD-10-CM | POA: Diagnosis not present

## 2021-04-23 DIAGNOSIS — M25559 Pain in unspecified hip: Secondary | ICD-10-CM | POA: Diagnosis not present

## 2021-04-23 DIAGNOSIS — I5022 Chronic systolic (congestive) heart failure: Secondary | ICD-10-CM | POA: Diagnosis not present

## 2021-04-23 DIAGNOSIS — I110 Hypertensive heart disease with heart failure: Secondary | ICD-10-CM | POA: Insufficient documentation

## 2021-04-23 DIAGNOSIS — I428 Other cardiomyopathies: Secondary | ICD-10-CM | POA: Insufficient documentation

## 2021-04-23 DIAGNOSIS — Z8249 Family history of ischemic heart disease and other diseases of the circulatory system: Secondary | ICD-10-CM | POA: Insufficient documentation

## 2021-04-23 DIAGNOSIS — F17210 Nicotine dependence, cigarettes, uncomplicated: Secondary | ICD-10-CM | POA: Insufficient documentation

## 2021-04-23 DIAGNOSIS — I2510 Atherosclerotic heart disease of native coronary artery without angina pectoris: Secondary | ICD-10-CM | POA: Insufficient documentation

## 2021-04-23 DIAGNOSIS — Z7982 Long term (current) use of aspirin: Secondary | ICD-10-CM | POA: Diagnosis not present

## 2021-04-23 LAB — BASIC METABOLIC PANEL
Anion gap: 11 (ref 5–15)
BUN: 10 mg/dL (ref 8–23)
CO2: 27 mmol/L (ref 22–32)
Calcium: 9.6 mg/dL (ref 8.9–10.3)
Chloride: 101 mmol/L (ref 98–111)
Creatinine, Ser: 1.08 mg/dL (ref 0.61–1.24)
GFR, Estimated: >60 mL/min (ref >60)
Glucose, Bld: 103 mg/dL — ABNORMAL HIGH (ref 70–99)
Potassium: 3.7 mmol/L (ref 3.5–5.1)
Sodium: 139 mmol/L (ref 135–145)

## 2021-04-23 LAB — LIPID PANEL
Cholesterol: 242 mg/dL — ABNORMAL HIGH (ref 0–200)
HDL: 62 mg/dL (ref >40)
LDL Cholesterol: 169 mg/dL — ABNORMAL HIGH (ref 0–99)
Total CHOL/HDL Ratio: 3.9 RATIO
Triglycerides: 54 mg/dL (ref <150)
VLDL: 11 mg/dL (ref 0–40)

## 2021-04-23 LAB — DIGOXIN LEVEL: Digoxin Level: <0.2 ng/mL — ABNORMAL LOW (ref 0.8–2.0)

## 2021-04-23 MED ORDER — DAPAGLIFLOZIN PROPANEDIOL 10 MG PO TABS: 10.0000 mg | ORAL_TABLET | Freq: Every day | ORAL | 11 refills | Status: DC

## 2021-04-23 NOTE — Progress Notes (Signed)
Patient ID: [name] [name], male   DOB: 08-Jan-1956, 65 y.o.   MRN: [id number]    Advanced Heart Failure Clinic Note   PCP: Dr Doreene Burke Primary Cardiologist: Dr Aundra Dubin  HPI: [name] [name] is a 65 y.o. with history of ETOH was found to have EF 10% by echo.  He had exertional dyspnea and chest pain, taken for [facility] 5/16 showing nonobstructive CAD, elevated filling pressures (but not marked elevation), and low cardiac index. He was admitted to optimize HF.  He was diuresed with IV lasix and transitioned to po lasix. Overall he diuresed 9 pounds. He continued on carvedilol, lisinopril, bidil, and spironolactone.  Since then, his HF meds have been titrated up.   Echo in 9/19 with EF 35-40%.  Echo in 5/21 showed EF 35-40%, moderate LVH, mildly decreased RV systolic function.   [name] [name] returns today for followup of CHF.  He smokes, now less than 1 ppd, but rarely drinks ETOH.  He claims to be taking all his meds though we have not seen him in clinic for about a year. No significant exertional dyspnea or chest pain.  No lightheadedness.  He denies claudication.   St Jude device interrogation: Stable thoracic impedance.   Labs (5/16): K 3.8, creatinine 0.94 => 1.19, HCT 48.4, LDL 121, HDL 50, digoxin 0.3, BNP 2343 => 367 Labs (6/16): K 4, creatinine 1.06 Labs (04/18/15): K 4.3 Creatinine 0.91 Labs (05/07/2015): K 4.5 Creatinine 0.88  Labs (10/16): K 4.6, creatinine 0.95, digoxin 0.5, TC 163 Labs (12/16) K 4.2, creatinine 0.90 Labs (2/17): K 3.8, creatinine 0.92 Labs (5/17): K 4, creatinine 0.92, digoxin 0.3 Labs (7/17): LFTs normal, LDL 70, HDL 34 Labs (8/17): digoxin 0.2, K 4.2, creatinine 0.88 Labs (1/18): digoxin 0.3, K 3.8, creatinine 0.88 Labs (12/18): K 4, creatinine 1.07, LDL 43, HDL 36 Labs (3/19): digoxin < 0.2, K 4, creatinine 0.97 Labs (1/21): hgb 15.4, digoxin < 0.2, K 4.2, 0.94 Labs (8/21): K 3.7, creatinine 0.89  PMH: 1. Low back pain 2. HTN 3. Hyperlipidemia 4. H/o cocaine  abuse: Not active. 5. ETOH abuse: 1 pint/day but has not cut back.  6. CAD: Coronary CT angiogram in 9/09 showed possible significant obstructive disease in the circumflex.   - LHC (5/16): Nonobstructive CAD.  7. Cardiomyopathy: Echo (9/09) with EF 35-40%.  Echo (5/16) with EF 10%, mild LV dilation, grade II diastolic dysfunction, mildly dilated RV with moderately decreased systolic function.   LHC/RHC (5/16): Nonobstructive coronary disease, mean RA 10, PA 49/28, mean PCWP 25, CI 1.4.  Echo (9/16) with EF 20-25%, severe LV dilation. St Jude ICD.  - CPX 11/04/15: FVC 2.85 FEV1 1.98, MVV 89, Peak RER 0.94 (submaximal), peak VO2 13 (41.2% predicted), [id number] slope: 26.  This study was limited by hip pain.  - Echo 02/18/16 LVEF 25-30%, Grade 1 DD, Mild LAE, RV mildly reduced.  - Echo (7/18): EF 30-35%, mild RV dilation.  - Echo (9/19): EF 35-40%.  - Echo (5/21): EF 35-40%, moderate LVH, mildly decreased RV systolic function.  8. Active smoker 9. PAD: ABI L normal, R mildly decreased.  - ABIs (8/18): Normal.  10. Type II diabetes: Borderline.   SH:  Lives in [place], works as Dealer, lives with son, smokes 1 ppd, drank 1 pint/day liquor => quit 5/16, prior cocaine use.   FH: Brother with MI in his 60s, another brother with ?SCD, mother with CVA.   ROS: All systems reviewed and negative except as per HPI.   Current Outpatient Medications  Medication Sig Dispense Refill   aspirin 81 MG EC tablet TAKE 1 TABLET(81 MG) BY MOUTH DAILY. SWALLOW WHOLE 30 tablet 11   carvedilol (COREG) 12.5 MG tablet Take 1.5 tablets (18.75 mg total) by mouth 2 (two) times daily with a meal. 270 tablet 3   dapagliflozin propanediol (FARXIGA) 10 MG TABS tablet Take 1 tablet (10 mg total) by mouth daily before breakfast. 30 tablet 11   digoxin (LANOXIN) 0.125 MG tablet TAKE 1 TABLET(125 MCG) BY MOUTH DAILY 90 tablet 3   ENTRESTO 97-103 MG TAKE 1 TABLET BY MOUTH TWICE DAILY 60 tablet 6   ezetimibe (ZETIA) 10 MG tablet  Take 1 tablet (10 mg total) by mouth daily. 90 tablet 3   isosorbide-hydrALAZINE (BIDIL) 20-37.5 MG tablet Take 2 tablets by mouth 3 (three) times daily. 180 tablet 11   metFORMIN (GLUCOPHAGE XR) 500 MG 24 hr tablet Take 1 tablet (500 mg total) by mouth daily with breakfast. 90 tablet 3   rosuvastatin (CRESTOR) 40 MG tablet TAKE 1 TABLET(40 MG) BY MOUTH DAILY 90 tablet 0   spironolactone (ALDACTONE) 25 MG tablet TAKE 1 TABLET(25 MG) BY MOUTH DAILY 90 tablet 2   triamcinolone ointment (KENALOG) 0.5 % Apply 1 application topically 2 (two) times daily. 15 g 0   No current facility-administered medications for this encounter.    Vitals:   04/23/21 1029  BP: 122/80  Pulse: 88  SpO2: 98%  Weight: 68.2 kg (150 lb 6.4 oz)    Wt Readings from Last 3 Encounters:  04/23/21 68.2 kg (150 lb 6.4 oz)  01/29/20 67.2 kg (148 lb 3.2 oz)  10/01/19 68.3 kg (150 lb 9.6 oz)     PHYSICAL EXAM: General: NAD Neck: No JVD, no thyromegaly or thyroid nodule.  Lungs: Clear to auscultation bilaterally with normal respiratory effort. CV: Nondisplaced PMI.  Heart regular S1/S2, no S3/S4, no murmur.  No peripheral edema.  No carotid bruit.  Unable to palpate pedal pulses.  Abdomen: Soft, nontender, no hepatosplenomegaly, no distention.  Skin: Intact without lesions or rashes.  Neurologic: Alert and oriented x 3.  Psych: Normal affect. Extremities: No clubbing or cyanosis.  HEENT: Normal.   ASSESSMENT & PLAN: 1. Chronic Systolic HF: Nonischemic cardiomyopathy with low output per RHC/LHC in 5/16.  Echo (9/16) with EF 20-25%, severe LV dilation s/p St Jude ICD.  Possible ETOH cardiomyopathy. Limited literacy.  Volume status stable on exam.  CPX (2/17) was submaximal due to hip pain so hard to interpret. Echo in 5/17 with LVEF 25-30%. Echo in 7/18 showed EF 30-35%. Echo in 9/19 and again in 5/21 showed EF 35-40%. He is not volume overloaded on exam or by [place], NYHA class II symptoms.  - Has not needed lasix.   Continue to use only as needed for weight gains of 3 lbs over night or 5 lbs in a weeks time.  - Continue Coreg 18.75 mg bid.   - Continue spironolactone 25 mg daily.  BMET today.  - Continue Bidil 2 tabs tid.  - Continue Entresto 97/103 bid.   - Continue Digoxin 0.125 daily. Check level today.   - Start dapagliflozin 10 mg daily, BMET 10 days.  - I will arrange repeat echo.  2. ETOH Abuse: Former heavy drinker, rare ETOH now.  3. Current Smoker: Continues to smoke, failed Chantix.  I encouraged him to quit.  4. PAD: No significant claudication now.  ABIs in 8/18 were normal.  - Continued to encourage him to quit smoking.   -  Continue ASA 81 and Crestor 40 daily.   5. Hyperlipidemia: Goal LDL < 70 with PAD - Check lipids.  5. CAD: Nonobstructive on prior cath.   Followup in 3 months with echo  [name] 04/23/2021

## 2021-04-23 NOTE — Patient Instructions (Signed)
START Farxiga 10 mg ,one tab daily  Labs today We will only contact you if something comes back abnormal or we need to make some changes. Otherwise no news is good news!  Labs needed in 10 days  Your physician recommends that you schedule a follow-up appointment in: 3 months with Dr Aundra Dubin and echo  Your physician has requested that you have an echocardiogram. Echocardiography is a painless test that uses sound waves to create images of your heart. It provides your doctor with information about the size and shape of your heart and how well your heart's chambers and valves are working. This procedure takes approximately one hour. There are no restrictions for this procedure.  Do the following things EVERYDAY: Weigh yourself in the morning before breakfast. Write it down and keep it in a log. Take your medicines as prescribed Eat low salt foods--Limit salt (sodium) to 2000 mg per day.  Stay as active as you can everyday Limit all fluids for the day to less than 2 liters  milAt the [facility], you and your health needs are our priority. As part of our continuing mission to provide you with exceptional heart care, we have created designated Provider Care Teams. These Care Teams include your primary Cardiologist (physician) and Advanced Practice Providers (APPs- Physician Assistants and Nurse Practitioners) who all work together to provide you with the care you need, when you need it.   You may see any of the following providers on your designated Care Team at your next follow up: Dr Glori Bickers Dr [name] Dr Patrice Paradise, NP Lyda Jester, [place] Ginnie Smart Audry Riles, PharmD   Please be sure to bring in all your medications bottles to every appointment.   If you have any questions or concerns before your next appointment please send [place] a message through [place] or call our office at [phone].    TO LEAVE A MESSAGE FOR THE NURSE  SELECT OPTION 2, PLEASE LEAVE A MESSAGE INCLUDING: YOUR NAME DATE OF BIRTH CALL BACK NUMBER REASON FOR CALL**this is important as we prioritize the call backs  YOU WILL RECEIVE A CALL BACK THE SAME DAY AS LONG AS YOU CALL BEFORE 4:00 PM

## 2021-04-24 ENCOUNTER — Telehealth (HOSPITAL_COMMUNITY): Payer: Self-pay

## 2021-04-24 LAB — CUP PACEART REMOTE DEVICE CHECK
Battery Remaining Longevity: 58 mo
Battery Remaining Percentage: 56 %
Battery Voltage: 2.95 V
Brady Statistic RV Percent Paced: 1 %
Date Time Interrogation Session: 20220728020014
HighPow Impedance: 77 Ohm
HighPow Impedance: 77 Ohm
Implantable Lead Implant Date: 20161012
Implantable Lead Location: 753860
Implantable Lead Model: 7122
Implantable Pulse Generator Implant Date: 20161012
Lead Channel Impedance Value: 450 Ohm
Lead Channel Pacing Threshold Amplitude: 0.75 V
Lead Channel Pacing Threshold Pulse Width: 0.5 ms
Lead Channel Sensing Intrinsic Amplitude: 12 mV
Lead Channel Setting Pacing Amplitude: 2.5 V
Lead Channel Setting Pacing Pulse Width: 0.5 ms
Lead Channel Setting Sensing Sensitivity: 0.5 mV
Pulse Gen Serial Number: 7308640

## 2021-04-24 NOTE — Telephone Encounter (Signed)
error 

## 2021-04-29 ENCOUNTER — Ambulatory Visit (HOSPITAL_COMMUNITY)
Admission: RE | Admit: 2021-04-29 | Discharge: 2021-04-29 | Disposition: A | Discharge status: Home / Self Care | Payer: Medicare Other | Source: Ambulatory Visit | Attending: Internal Medicine | Admitting: Internal Medicine

## 2021-04-29 ENCOUNTER — Other Ambulatory Visit: Payer: Self-pay

## 2021-04-29 DIAGNOSIS — I5022 Chronic systolic (congestive) heart failure: Secondary | ICD-10-CM | POA: Diagnosis present

## 2021-04-29 LAB — BASIC METABOLIC PANEL
Anion gap: 9 (ref 5–15)
BUN: 12 mg/dL (ref 8–23)
CO2: 26 mmol/L (ref 22–32)
Calcium: 9.3 mg/dL (ref 8.9–10.3)
Chloride: 100 mmol/L (ref 98–111)
Creatinine, Ser: 1.17 mg/dL (ref 0.61–1.24)
GFR, Estimated: >60 mL/min (ref >60)
Glucose, Bld: 128 mg/dL — ABNORMAL HIGH (ref 70–99)
Potassium: 3.8 mmol/L (ref 3.5–5.1)
Sodium: 135 mmol/L (ref 135–145)

## 2021-05-20 NOTE — Progress Notes (Signed)
Remote ICD transmission.   

## 2021-05-21 ENCOUNTER — Other Ambulatory Visit (HOSPITAL_COMMUNITY): Payer: Self-pay | Admitting: Cardiology

## 2021-07-23 ENCOUNTER — Ambulatory Visit (INDEPENDENT_AMBULATORY_CARE_PROVIDER_SITE_OTHER): Payer: Medicare Other

## 2021-07-23 DIAGNOSIS — I5022 Chronic systolic (congestive) heart failure: Secondary | ICD-10-CM

## 2021-07-23 LAB — CUP PACEART REMOTE DEVICE CHECK
Battery Remaining Longevity: 55 mo
Battery Remaining Percentage: 53 %
Battery Voltage: 2.95 V
Brady Statistic RV Percent Paced: 1 %
Date Time Interrogation Session: 20221027034352
HighPow Impedance: 68 Ohm
HighPow Impedance: 68 Ohm
Implantable Lead Implant Date: 20161012
Implantable Lead Location: 753860
Implantable Lead Model: 7122
Implantable Pulse Generator Implant Date: 20161012
Lead Channel Impedance Value: 450 Ohm
Lead Channel Pacing Threshold Amplitude: 0.75 V
Lead Channel Pacing Threshold Pulse Width: 0.5 ms
Lead Channel Sensing Intrinsic Amplitude: 12 mV
Lead Channel Setting Pacing Amplitude: 2.5 V
Lead Channel Setting Pacing Pulse Width: 0.5 ms
Lead Channel Setting Sensing Sensitivity: 0.5 mV
Pulse Gen Serial Number: 7308640

## 2021-07-24 ENCOUNTER — Ambulatory Visit (HOSPITAL_COMMUNITY)
Admission: RE | Admit: 2021-07-24 | Discharge: 2021-07-24 | Disposition: A | Discharge status: Home / Self Care | Payer: Medicare Other | Source: Ambulatory Visit | Attending: Cardiology | Admitting: Cardiology

## 2021-07-24 ENCOUNTER — Ambulatory Visit (HOSPITAL_COMMUNITY): Admission: RE | Admit: 2021-07-24 | Payer: Medicare Other | Source: Ambulatory Visit

## 2021-07-24 ENCOUNTER — Other Ambulatory Visit: Payer: Self-pay

## 2021-07-24 ENCOUNTER — Encounter (HOSPITAL_COMMUNITY): Payer: Self-pay | Admitting: Cardiology

## 2021-07-24 VITALS — BP 135/90 | HR 90 | Wt 152.2 lb

## 2021-07-24 DIAGNOSIS — Z7984 Long term (current) use of oral hypoglycemic drugs: Secondary | ICD-10-CM | POA: Insufficient documentation

## 2021-07-24 DIAGNOSIS — I428 Other cardiomyopathies: Secondary | ICD-10-CM | POA: Insufficient documentation

## 2021-07-24 DIAGNOSIS — F17210 Nicotine dependence, cigarettes, uncomplicated: Secondary | ICD-10-CM | POA: Insufficient documentation

## 2021-07-24 DIAGNOSIS — R079 Chest pain, unspecified: Secondary | ICD-10-CM | POA: Diagnosis not present

## 2021-07-24 DIAGNOSIS — Z7901 Long term (current) use of anticoagulants: Secondary | ICD-10-CM | POA: Insufficient documentation

## 2021-07-24 DIAGNOSIS — E785 Hyperlipidemia, unspecified: Secondary | ICD-10-CM | POA: Insufficient documentation

## 2021-07-24 DIAGNOSIS — Z8249 Family history of ischemic heart disease and other diseases of the circulatory system: Secondary | ICD-10-CM | POA: Insufficient documentation

## 2021-07-24 DIAGNOSIS — Z7982 Long term (current) use of aspirin: Secondary | ICD-10-CM | POA: Insufficient documentation

## 2021-07-24 DIAGNOSIS — I5022 Chronic systolic (congestive) heart failure: Secondary | ICD-10-CM | POA: Insufficient documentation

## 2021-07-24 DIAGNOSIS — I110 Hypertensive heart disease with heart failure: Secondary | ICD-10-CM | POA: Diagnosis not present

## 2021-07-24 DIAGNOSIS — R0609 Other forms of dyspnea: Secondary | ICD-10-CM | POA: Insufficient documentation

## 2021-07-24 DIAGNOSIS — I2510 Atherosclerotic heart disease of native coronary artery without angina pectoris: Secondary | ICD-10-CM | POA: Diagnosis not present

## 2021-07-24 DIAGNOSIS — M25559 Pain in unspecified hip: Secondary | ICD-10-CM | POA: Diagnosis not present

## 2021-07-24 DIAGNOSIS — F1010 Alcohol abuse, uncomplicated: Secondary | ICD-10-CM | POA: Insufficient documentation

## 2021-07-24 LAB — LIPID PANEL
Cholesterol: 238 mg/dL — ABNORMAL HIGH (ref 0–200)
HDL: 77 mg/dL (ref >40)
LDL Cholesterol: 151 mg/dL — ABNORMAL HIGH (ref 0–99)
Total CHOL/HDL Ratio: 3.1 RATIO
Triglycerides: 52 mg/dL (ref <150)
VLDL: 10 mg/dL (ref 0–40)

## 2021-07-24 LAB — BASIC METABOLIC PANEL
Anion gap: 9 (ref 5–15)
BUN: 9 mg/dL (ref 8–23)
CO2: 28 mmol/L (ref 22–32)
Calcium: 9.4 mg/dL (ref 8.9–10.3)
Chloride: 104 mmol/L (ref 98–111)
Creatinine, Ser: 0.79 mg/dL (ref 0.61–1.24)
GFR, Estimated: >60 mL/min (ref >60)
Glucose, Bld: 89 mg/dL (ref 70–99)
Potassium: 3.7 mmol/L (ref 3.5–5.1)
Sodium: 141 mmol/L (ref 135–145)

## 2021-07-24 LAB — DIGOXIN LEVEL: Digoxin Level: <0.2 ng/mL — ABNORMAL LOW (ref 0.8–2.0)

## 2021-07-24 MED ORDER — CARVEDILOL 25 MG PO TABS: 25.0000 mg | ORAL_TABLET | Freq: Two times a day (BID) | ORAL | 6 refills | Status: DC

## 2021-07-24 NOTE — Patient Instructions (Signed)
Increase Carvedilol to 25 mg Twice daily   Labs done today, we will call you for abnormal results  Your physician has requested that you have an echocardiogram. Echocardiography is a painless test that uses sound waves to create images of your heart. It provides your doctor with information about the size and shape of your heart and how well your heart's chambers and valves are working. This procedure takes approximately one hour. There are no restrictions for this procedure.  Your physician recommends that you schedule a follow-up appointment in: 4 months  If you have any questions or concerns before your next appointment please send [place] a message through [place] or call our office at [phone].    TO LEAVE A MESSAGE FOR THE NURSE SELECT OPTION 2, PLEASE LEAVE A MESSAGE INCLUDING: YOUR NAME DATE OF BIRTH CALL BACK NUMBER REASON FOR CALL**this is important as we prioritize the call backs  YOU WILL RECEIVE A CALL BACK THE SAME DAY AS LONG AS YOU CALL BEFORE 4:00 PM  At the [facility], you and your health needs are our priority. As part of our continuing mission to provide you with exceptional heart care, we have created designated Provider Care Teams. These Care Teams include your primary Cardiologist (physician) and Advanced Practice Providers (APPs- Physician Assistants and Nurse Practitioners) who all work together to provide you with the care you need, when you need it.   You may see any of the following providers on your designated Care Team at your next follow up: Dr Glori Bickers Dr Haynes Kerns, NP Lyda Jester, [place] [facility] [place], [place] Audry Riles, PharmD   Please be sure to bring in all your medications bottles to every appointment.

## 2021-07-25 NOTE — Progress Notes (Signed)
Patient ID: [name] [name], male   DOB: Aug 28, 1956, 65 y.o.   MRN: [id number]    Advanced Heart Failure Clinic Note   PCP: Dr Doreene Burke Primary Cardiologist: Dr Aundra Dubin  HPI: [name] is a 65 y.o. with history of ETOH was found to have EF 10% by echo.  He had exertional dyspnea and chest pain, taken for [facility] 5/16 showing nonobstructive CAD, elevated filling pressures (but not marked elevation), and low cardiac index. He was admitted to optimize HF.  He was diuresed with IV lasix and transitioned to po lasix. Overall he diuresed 9 pounds. He continued on carvedilol, lisinopril, bidil, and spironolactone.  Since then, his HF meds have been titrated up.   Echo in 9/19 with EF 35-40%.  Echo in 5/21 showed EF 35-40%, moderate LVH, mildly decreased RV systolic function.   [name] returns today for followup of CHF.  He smokes, now less than 1 ppd, but rarely drinks ETOH.  He claims to be taking all his medications, but has had undetectable digoxin level in the past and lipids have been very high despite Crestor 40 mg daily.  He says that he "sometimes" forgets his Crestor.  He forgot to do his echo prior to appointment.   No significant exertional dyspnea or chest pain.  He stays active, does a lot of walking.  No lightheadedness.    ECG (personally reviewed): NSR, LVH with repolarization abnormality.   St Jude device interrogation: Stable thoracic impedance.    Labs (5/16): K 3.8, creatinine 0.94 => 1.19, HCT 48.4, LDL 121, HDL 50, digoxin 0.3, BNP 2343 => 367 Labs (6/16): K 4, creatinine 1.06 Labs (04/18/15): K 4.3 Creatinine 0.91 Labs (05/07/2015): K 4.5 Creatinine 0.88  Labs (10/16): K 4.6, creatinine 0.95, digoxin 0.5, TC 163 Labs (12/16) K 4.2, creatinine 0.90 Labs (2/17): K 3.8, creatinine 0.92 Labs (5/17): K 4, creatinine 0.92, digoxin 0.3 Labs (7/17): LFTs normal, LDL 70, HDL 34 Labs (8/17): digoxin 0.2, K 4.2, creatinine 0.88 Labs (1/18): digoxin 0.3, K 3.8, creatinine 0.88 Labs  (12/18): K 4, creatinine 1.07, LDL 43, HDL 36 Labs (3/19): digoxin < 0.2, K 4, creatinine 0.97 Labs (1/21): hgb 15.4, digoxin < 0.2, K 4.2, 0.94 Labs (8/21): K 3.7, creatinine 0.89 Labs (7/22): LDL 169 Labs (8/22): K 3.8, creatinine 1.17  PMH: 1. Low back pain 2. HTN 3. Hyperlipidemia 4. H/o cocaine abuse: Not active. 5. ETOH abuse: 1 pint/day but has not cut back.  6. CAD: Coronary CT angiogram in 9/09 showed possible significant obstructive disease in the circumflex.   - LHC (5/16): Nonobstructive CAD.  7. Cardiomyopathy: Echo (9/09) with EF 35-40%.  Echo (5/16) with EF 10%, mild LV dilation, grade II diastolic dysfunction, mildly dilated RV with moderately decreased systolic function.   LHC/RHC (5/16): Nonobstructive coronary disease, mean RA 10, PA 49/28, mean PCWP 25, CI 1.4.  Echo (9/16) with EF 20-25%, severe LV dilation. St Jude ICD.  - CPX 11/04/15: FVC 2.85 FEV1 1.98, MVV 89, Peak RER 0.94 (submaximal), peak VO2 13 (41.2% predicted), PZ/WC58 slope: 26.  This study was limited by hip pain.  - Echo 02/18/16 LVEF 25-30%, Grade 1 DD, Mild LAE, RV mildly reduced.  - Echo (7/18): EF 30-35%, mild RV dilation.  - Echo (9/19): EF 35-40%.  - Echo (5/21): EF 35-40%, moderate LVH, mildly decreased RV systolic function.  8. Active smoker 9. PAD: ABI L normal, R mildly decreased.  - ABIs (8/18): Normal.  10. Type II diabetes: Borderline.   SH:  Lives in [place], works as Dealer, lives with son, smokes 1 ppd, drank 1 pint/day liquor => quit 5/16, prior cocaine use.   FH: Brother with MI in his 19s, another brother with ?SCD, mother with CVA.   ROS: All systems reviewed and negative except as per HPI.   Current Outpatient Medications  Medication Sig Dispense Refill   aspirin 81 MG EC tablet TAKE 1 TABLET(81 MG) BY MOUTH DAILY. SWALLOW WHOLE 30 tablet 11   dapagliflozin propanediol (FARXIGA) 10 MG TABS tablet Take 1 tablet (10 mg total) by mouth daily before breakfast. 30 tablet 11    digoxin (LANOXIN) 0.125 MG tablet TAKE 1 TABLET(125 MCG) BY MOUTH DAILY 90 tablet 3   ENTRESTO 97-103 MG TAKE 1 TABLET BY MOUTH TWICE DAILY 60 tablet 6   ezetimibe (ZETIA) 10 MG tablet TAKE 1 TABLET(10 MG) BY MOUTH DAILY 90 tablet 3   isosorbide-hydrALAZINE (BIDIL) 20-37.5 MG tablet Take 2 tablets by mouth 3 (three) times daily. 180 tablet 11   metFORMIN (GLUCOPHAGE XR) 500 MG 24 hr tablet Take 1 tablet (500 mg total) by mouth daily with breakfast. 90 tablet 3   rosuvastatin (CRESTOR) 40 MG tablet TAKE 1 TABLET(40 MG) BY MOUTH DAILY 90 tablet 0   spironolactone (ALDACTONE) 25 MG tablet TAKE 1 TABLET(25 MG) BY MOUTH DAILY 90 tablet 2   triamcinolone ointment (KENALOG) 0.5 % Apply 1 application topically 2 (two) times daily. 15 g 0   carvedilol (COREG) 25 MG tablet Take 1 tablet (25 mg total) by mouth 2 (two) times daily with a meal. 60 tablet 6   No current facility-administered medications for this encounter.    Vitals:   07/24/21 1015  BP: 135/90  Pulse: 90  SpO2: 98%  Weight: 69 kg (152 lb 3.2 oz)    Wt Readings from Last 3 Encounters:  07/24/21 69 kg (152 lb 3.2 oz)  04/23/21 68.2 kg (150 lb 6.4 oz)  01/29/20 67.2 kg (148 lb 3.2 oz)     PHYSICAL EXAM: General: NAD Neck: No JVD, no thyromegaly or thyroid nodule.  Lungs: Clear to auscultation bilaterally with normal respiratory effort. CV: Nondisplaced PMI.  Heart regular S1/S2, no S3/S4, no murmur.  No peripheral edema.  No carotid bruit.  Normal pedal pulses.  Abdomen: Soft, nontender, no hepatosplenomegaly, no distention.  Skin: Intact without lesions or rashes.  Neurologic: Alert and oriented x 3.  Psych: Normal affect. Extremities: No clubbing or cyanosis.  HEENT: Normal.   ASSESSMENT & PLAN: 1. Chronic Systolic HF: Nonischemic cardiomyopathy with low output per RHC/LHC in 5/16.  Echo (9/16) with EF 20-25%, severe LV dilation s/p St Jude ICD.  Possible ETOH cardiomyopathy. Limited literacy.  Volume status stable on exam.   CPX (2/17) was submaximal due to hip pain so hard to interpret. Echo in 5/17 with LVEF 25-30%. Echo in 7/18 showed EF 30-35%. Echo in 9/19 and again in 5/21 showed EF 35-40%. He is not volume overloaded on exam or by Corevue, NYHA class I-II symptoms. I question his medication compliance though he reports taking everything.  - Has not needed lasix.  Continue to use only as needed for weight gains of 3 lbs over night or 5 lbs in a weeks time.  - Increase Coreg to 25 mg bid.   - Continue spironolactone 25 mg daily.  BMET today.  - Continue Bidil 2 tabs tid.  - Continue Entresto 97/103 bid.   - Continue Digoxin 0.125 daily. Check level today.   - Continue dapagliflozin  10 mg daily.  - reschedule echo.  2. ETOH Abuse: Former heavy drinker, rare ETOH now.  3. Current Smoker: Continues to smoke, failed Chantix.  I encouraged him to quit.  4. PAD: No significant claudication now.  ABIs in 8/18 were normal.  - Continued to encourage him to quit smoking.   - Continue ASA 81 and Crestor 40 daily.   5. Hyperlipidemia: Goal LDL < 70 with PAD - Check lipids, says he has been taking Crestor.  5. CAD: Nonobstructive on prior cath.   Followup in 4 months with APP.   [name] 07/25/2021

## 2021-07-27 NOTE — Addendum Note (Signed)
Encounter addended by: Scarlette Calico, RN on: 07/27/2021 2:24 PM  Actions taken: Order list changed, Diagnosis association updated

## 2021-07-31 NOTE — Progress Notes (Signed)
Remote ICD transmission.   

## 2021-08-27 ENCOUNTER — Ambulatory Visit (HOSPITAL_COMMUNITY): Payer: Medicare Other

## 2021-08-27 ENCOUNTER — Other Ambulatory Visit (HOSPITAL_COMMUNITY): Payer: Medicare Other

## 2021-09-10 ENCOUNTER — Other Ambulatory Visit (HOSPITAL_COMMUNITY): Payer: Medicare Other

## 2021-10-15 ENCOUNTER — Other Ambulatory Visit (HOSPITAL_COMMUNITY): Payer: Medicare Other

## 2021-10-15 ENCOUNTER — Ambulatory Visit (HOSPITAL_COMMUNITY): Payer: Medicare Other

## 2021-10-22 ENCOUNTER — Ambulatory Visit (INDEPENDENT_AMBULATORY_CARE_PROVIDER_SITE_OTHER): Payer: Medicare Other

## 2021-10-22 DIAGNOSIS — I428 Other cardiomyopathies: Secondary | ICD-10-CM

## 2021-10-22 LAB — CUP PACEART REMOTE DEVICE CHECK
Battery Remaining Longevity: 53 mo
Battery Remaining Percentage: 52 %
Battery Voltage: 2.93 V
Brady Statistic RV Percent Paced: 1 %
Date Time Interrogation Session: 20230126020020
HighPow Impedance: 65 Ohm
HighPow Impedance: 65 Ohm
Implantable Lead Implant Date: 20161012
Implantable Lead Location: 753860
Implantable Lead Model: 7122
Implantable Pulse Generator Implant Date: 20161012
Lead Channel Impedance Value: 430 Ohm
Lead Channel Pacing Threshold Amplitude: 0.75 V
Lead Channel Pacing Threshold Pulse Width: 0.5 ms
Lead Channel Sensing Intrinsic Amplitude: 12 mV
Lead Channel Setting Pacing Amplitude: 2.5 V
Lead Channel Setting Pacing Pulse Width: 0.5 ms
Lead Channel Setting Sensing Sensitivity: 0.5 mV
Pulse Gen Serial Number: 7308640

## 2021-10-30 ENCOUNTER — Ambulatory Visit (HOSPITAL_COMMUNITY)
Admission: RE | Admit: 2021-10-30 | Discharge: 2021-10-30 | Disposition: A | Discharge status: Home / Self Care | Payer: Medicare Other | Source: Ambulatory Visit | Attending: Cardiology | Admitting: Cardiology

## 2021-10-30 ENCOUNTER — Ambulatory Visit (HOSPITAL_BASED_OUTPATIENT_CLINIC_OR_DEPARTMENT_OTHER)
Admission: RE | Admit: 2021-10-30 | Discharge: 2021-10-30 | Disposition: A | Discharge status: Home / Self Care | Payer: Medicare Other | Source: Ambulatory Visit | Attending: Internal Medicine | Admitting: Internal Medicine

## 2021-10-30 ENCOUNTER — Other Ambulatory Visit: Payer: Self-pay

## 2021-10-30 DIAGNOSIS — I5022 Chronic systolic (congestive) heart failure: Secondary | ICD-10-CM | POA: Insufficient documentation

## 2021-10-30 DIAGNOSIS — E785 Hyperlipidemia, unspecified: Secondary | ICD-10-CM | POA: Diagnosis not present

## 2021-10-30 DIAGNOSIS — I2510 Atherosclerotic heart disease of native coronary artery without angina pectoris: Secondary | ICD-10-CM | POA: Insufficient documentation

## 2021-10-30 DIAGNOSIS — I110 Hypertensive heart disease with heart failure: Secondary | ICD-10-CM | POA: Insufficient documentation

## 2021-10-30 DIAGNOSIS — Z95810 Presence of automatic (implantable) cardiac defibrillator: Secondary | ICD-10-CM | POA: Diagnosis not present

## 2021-10-30 LAB — LIPID PANEL
Cholesterol: 135 mg/dL (ref 0–200)
HDL: 57 mg/dL (ref >40)
LDL Cholesterol: 68 mg/dL (ref 0–99)
Total CHOL/HDL Ratio: 2.4 RATIO
Triglycerides: 49 mg/dL (ref <150)
VLDL: 10 mg/dL (ref 0–40)

## 2021-10-30 LAB — ECHOCARDIOGRAM COMPLETE
Area-P 1/2: 2.86 cm2
Calc EF: 19.7 %
S' Lateral: 5.6 cm
Single Plane A2C EF: 20.1 %
Single Plane A4C EF: 20.6 %

## 2021-11-02 NOTE — Progress Notes (Signed)
Remote ICD transmission.   

## 2021-11-04 ENCOUNTER — Telehealth (HOSPITAL_COMMUNITY): Payer: Self-pay | Admitting: Cardiology

## 2021-11-04 DIAGNOSIS — E785 Hyperlipidemia, unspecified: Secondary | ICD-10-CM

## 2021-11-04 DIAGNOSIS — I428 Other cardiomyopathies: Secondary | ICD-10-CM

## 2021-11-04 DIAGNOSIS — I5022 Chronic systolic (congestive) heart failure: Secondary | ICD-10-CM

## 2021-11-04 MED ORDER — ENTRESTO 97-103 MG PO TABS: 1.0000 | ORAL_TABLET | Freq: Two times a day (BID) | ORAL | 6 refills | Status: DC

## 2021-11-04 MED ORDER — SPIRONOLACTONE 25 MG PO TABS: ORAL_TABLET | ORAL | 2 refills | Status: DC

## 2021-11-04 MED ORDER — DIGOXIN 125 MCG PO TABS: 0.1250 mg | ORAL_TABLET | Freq: Every day | ORAL | 3 refills | Status: DC

## 2021-11-04 MED ORDER — ASPIRIN 81 MG PO TBEC: DELAYED_RELEASE_TABLET | ORAL | 11 refills | Status: DC

## 2021-11-04 MED ORDER — ISOSORB DINITRATE-HYDRALAZINE 20-37.5 MG PO TABS: 2.0000 | ORAL_TABLET | Freq: Three times a day (TID) | ORAL | 11 refills | Status: DC

## 2021-11-04 MED ORDER — ROSUVASTATIN CALCIUM 40 MG PO TABS
ORAL_TABLET | ORAL | 3 refills | Status: DC
Start: 2021-11-04 — End: 2022-10-15

## 2021-11-04 MED ORDER — DAPAGLIFLOZIN PROPANEDIOL 10 MG PO TABS: 10.0000 mg | ORAL_TABLET | Freq: Every day | ORAL | 11 refills | Status: DC

## 2021-11-04 MED ORDER — EZETIMIBE 10 MG PO TABS: ORAL_TABLET | ORAL | 3 refills | Status: DC

## 2021-11-04 MED ORDER — CARVEDILOL 25 MG PO TABS: 25.0000 mg | ORAL_TABLET | Freq: Two times a day (BID) | ORAL | 6 refills | Status: DC

## 2021-11-04 NOTE — Telephone Encounter (Signed)
Pt request refills on all medications

## 2021-11-17 ENCOUNTER — Encounter (HOSPITAL_COMMUNITY): Payer: Medicare Other

## 2021-11-18 NOTE — Progress Notes (Addendum)
Patient ID: [name] [name], male   DOB: May 19, 1956, 66 y.o.   MRN: [id number]    Advanced Heart Failure Clinic Note   PCP: none HF Cardiologist: Dr. Aundra Dubin  HPI: [name] [name] is a 66 y.o. with history of ETOH was found to have EF 10% by echo.  He had exertional dyspnea and chest pain, taken for [facility] 5/16 showing nonobstructive CAD, elevated filling pressures (but not marked elevation), and low cardiac index. He was admitted to optimize HF.  He was diuresed with IV lasix and transitioned to po lasix. Overall he diuresed 9 pounds. He continued on carvedilol, lisinopril, bidil, and spironolactone.  Since then, his HF meds have been titrated up.   Echo in 9/19 with EF 35-40%.  Echo in 5/21 showed EF 35-40%, moderate LVH, mildly decreased RV systolic function.   Follow up 10/22 he continued to struggle with medication compliance, NYHA I-II and volume stable.  Echo 2/23 showed significantly decreased EF < 20%.   Today he returns for HF follow up. His son passed away 2023-09-26 and he had been off all HF meds x 1 month prior to echo. He does not have significant dyspnea walking on flat ground.  Does OK carrying groceries inside. Denies increasing SOB, palpitations, CP, dizziness, edema, or PND/Orthopnea. Appetite ok. No fever or chills. Weight at home 149-150 pounds. Taking all medications now. Smoking 1 ppd, ETOH 1-2/day, says he has cut way back.   ECG (personally reviewed): NSR, LVH   St Jude device interrogation (personally reviewed): Stable thoracic impedance.    Labs (5/16): K 3.8, creatinine 0.94 => 1.19, HCT 48.4, LDL 121, HDL 50, digoxin 0.3, BNP 2343 => 367 Labs (6/16): K 4, creatinine 1.06 Labs (04/18/15): K 4.3 Creatinine 0.91 Labs (05/07/2015): K 4.5 Creatinine 0.88  Labs (10/16): K 4.6, creatinine 0.95, digoxin 0.5, TC 163 Labs (12/16) K 4.2, creatinine 0.90 Labs (2/17): K 3.8, creatinine 0.92 Labs (5/17): K 4, creatinine 0.92, digoxin 0.3 Labs (7/17): LFTs normal, LDL 70, HDL  34 Labs (8/17): digoxin 0.2, K 4.2, creatinine 0.88 Labs (1/18): digoxin 0.3, K 3.8, creatinine 0.88 Labs (12/18): K 4, creatinine 1.07, LDL 43, HDL 36 Labs (3/19): digoxin < 0.2, K 4, creatinine 0.97 Labs (1/21): hgb 15.4, digoxin < 0.2, K 4.2, 0.94 Labs (8/21): K 3.7, creatinine 0.89 Labs (7/22): LDL 169 Labs (8/22): K 3.8, creatinine 1.17 Labs (10/22): K 3.7, creatinine 0.79 Labs (2/23): LDL 68  PMH: 1. Low back pain 2. HTN 3. Hyperlipidemia 4. H/o cocaine abuse: Not active. 5. ETOH abuse: 1 pint/day but has not cut back.  6. CAD: Coronary CT angiogram in 9/09 showed possible significant obstructive disease in the circumflex.   - LHC (5/16): Nonobstructive CAD.  7. Cardiomyopathy: Echo (9/09) with EF 35-40%.  Echo (5/16) with EF 10%, mild LV dilation, grade II diastolic dysfunction, mildly dilated RV with moderately decreased systolic function.   LHC/RHC (5/16): Nonobstructive coronary disease, mean RA 10, PA 49/28, mean PCWP 25, CI 1.4.  Echo (9/16) with EF 20-25%, severe LV dilation. St Jude ICD.  - CPX 11/04/15: FVC 2.85 FEV1 1.98, MVV 89, Peak RER 0.94 (submaximal), peak VO2 13 (41.2% predicted), LG/XQ11 slope: 26.  This study was limited by hip pain.  - Echo 02/18/16 LVEF 25-30%, Grade 1 DD, Mild LAE, RV mildly reduced.  - Echo (7/18): EF 30-35%, mild RV dilation.  - Echo (9/19): EF 35-40%.  - Echo (5/21): EF 35-40%, moderate LVH, mildly decreased RV systolic function.  - Echo (2/23): EF <  20%, severely decreased LV dysfunction, global HK, Grade II DD, RV mildly reduced 8. Active smoker 9. PAD: ABI L normal, R mildly decreased.  - ABIs (8/18): Normal.  10. Type II diabetes: Borderline.   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   FH: Brother with MI in his 75s, another brother with ?SCD, mother with CVA.   ROS: All systems reviewed and negative except as per HPI.   Current  Outpatient Medications  Medication Sig Dispense Refill   aspirin 81 MG EC tablet TAKE 1 TABLET(81 MG) BY MOUTH DAILY. SWALLOW WHOLE 30 tablet 11   carvedilol (COREG) 25 MG tablet Take 1 tablet (25 mg total) by mouth 2 (two) times daily with a meal. 60 tablet 6   dapagliflozin propanediol (FARXIGA) 10 MG TABS tablet Take 1 tablet (10 mg total) by mouth daily before breakfast. 30 tablet 11   digoxin (LANOXIN) 0.125 MG tablet Take 1 tablet (0.125 mg total) by mouth daily. 90 tablet 3   ezetimibe (ZETIA) 10 MG tablet TAKE 1 TABLET(10 MG) BY MOUTH DAILY 90 tablet 3   isosorbide-hydrALAZINE (BIDIL) 20-37.5 MG tablet Take 2 tablets by mouth 3 (three) times daily. 180 tablet 11   rosuvastatin (CRESTOR) 40 MG tablet TAKE 1 TABLET(40 MG) BY MOUTH DAILY 90 tablet 3   sacubitril-valsartan (ENTRESTO) 97-103 MG Take 1 tablet by mouth 2 (two) times daily. 60 tablet 6   spironolactone (ALDACTONE) 25 MG tablet TAKE 1 TABLET(25 MG) BY MOUTH DAILY 90 tablet 2   triamcinolone ointment (KENALOG) 0.5 % Apply 1 application topically 2 (two) times daily. 15 g 0   metFORMIN (GLUCOPHAGE XR) 500 MG 24 hr tablet Take 1 tablet (500 mg total) by mouth daily with breakfast. (Patient not taking: Reported on 11/23/2021) 90 tablet 3   No current facility-administered medications for this encounter.   BP 110/78    Pulse 80    Wt 67.8 kg (149 lb 6.4 oz)    SpO2 96%    BMI 19.71 kg/m   Wt Readings from Last 3 Encounters:  11/23/21 67.8 kg (149 lb 6.4 oz)  07/24/21 69 kg (152 lb 3.2 oz)  04/23/21 68.2 kg (150 lb 6.4 oz)    PHYSICAL EXAM: General:  NAD. No resp difficulty HEENT: Normal Neck: Supple. No JVD. Carotids 2+ bilat; no bruits. No lymphadenopathy or thryomegaly appreciated. Cor: PMI nondisplaced. Regular rate & rhythm. No rubs, gallops or murmurs. Lungs: Clear Abdomen: Soft, nontender, nondistended. No hepatosplenomegaly. No bruits or masses. Good bowel sounds. Extremities: No cyanosis, clubbing, rash, edema Neuro:  Alert & oriented x 3, cranial nerves grossly intact. Moves all 4 extremities w/o difficulty. Affect pleasant.  ASSESSMENT & PLAN: 1. Chronic Systolic HF: Nonischemic cardiomyopathy with low output per RHC/LHC in 5/16.  Echo (9/16) with EF 20-25%, severe LV dilation s/p St Jude ICD.  Possible ETOH cardiomyopathy. Limited literacy. CPX (2/17) was submaximal due to hip pain so hard to interpret. Echo in 5/17 with LVEF 25-30%. Echo in 7/18 showed EF 30-35%. Echo in 9/19 and again in 5/21 showed EF 35-40%. Echo 2/23 showed EF < 20%. He is not volume overloaded on exam or by Corevue, NYHA class II symptoms.  - Continue Coreg 25 mg bid.   - Continue spironolactone 25 mg daily.  BMET today.  - Continue Bidil 2 tabs tid.  - Continue Entresto 97/103 mg bid.   - Continue digoxin 0.125  daily. Check level today.   - Continue dapagliflozin 10 mg daily.  - Has not needed lasix.  Continue to use only as needed for weight gains of 3 lbs over night or 5 lbs in a weeks time.  - Plan to repeat echo in 3 months after being back on GDMT. Discussed with Dr. Aundra Dubin, will defer CPX for now. 2. ETOH Abuse: Former heavy drinker, now drinking 1-2 drinks/day. - Advised further cutting down and ideally stopping. 3. Current Smoker: Continues to smoke, failed Chantix.  I encouraged him to quit.  4. PAD: No significant claudication now.  ABIs in 8/18 were normal.  - Encouraged smoking cessation.   - Continue ASA 81 and Crestor 40 daily.   5. Hyperlipidemia: Goal LDL < 70 with PAD. Good lipids 2/23. 6. CAD: Nonobstructive on prior cath.   Followup in 6 weeks with APP and 12 weeks with Dr. Aundra Dubin + echo. He was given a list of PCPs today to establish care.  [place], [place] 11/23/2021

## 2021-11-23 ENCOUNTER — Other Ambulatory Visit: Payer: Self-pay

## 2021-11-23 ENCOUNTER — Ambulatory Visit (HOSPITAL_COMMUNITY)
Admission: RE | Admit: 2021-11-23 | Discharge: 2021-11-23 | Disposition: A | Discharge status: Home / Self Care | Payer: Medicare Other | Source: Ambulatory Visit | Attending: Family Medicine | Admitting: Family Medicine

## 2021-11-23 ENCOUNTER — Encounter (HOSPITAL_COMMUNITY): Payer: Self-pay

## 2021-11-23 VITALS — BP 110/78 | HR 80 | Wt 149.4 lb

## 2021-11-23 DIAGNOSIS — Z95810 Presence of automatic (implantable) cardiac defibrillator: Secondary | ICD-10-CM | POA: Insufficient documentation

## 2021-11-23 DIAGNOSIS — I110 Hypertensive heart disease with heart failure: Secondary | ICD-10-CM | POA: Diagnosis not present

## 2021-11-23 DIAGNOSIS — I739 Peripheral vascular disease, unspecified: Secondary | ICD-10-CM

## 2021-11-23 DIAGNOSIS — Z634 Disappearance and death of family member: Secondary | ICD-10-CM | POA: Diagnosis not present

## 2021-11-23 DIAGNOSIS — I2510 Atherosclerotic heart disease of native coronary artery without angina pectoris: Secondary | ICD-10-CM

## 2021-11-23 DIAGNOSIS — F17200 Nicotine dependence, unspecified, uncomplicated: Secondary | ICD-10-CM | POA: Diagnosis not present

## 2021-11-23 DIAGNOSIS — Z7984 Long term (current) use of oral hypoglycemic drugs: Secondary | ICD-10-CM | POA: Insufficient documentation

## 2021-11-23 DIAGNOSIS — I5022 Chronic systolic (congestive) heart failure: Secondary | ICD-10-CM

## 2021-11-23 DIAGNOSIS — Z7982 Long term (current) use of aspirin: Secondary | ICD-10-CM | POA: Diagnosis not present

## 2021-11-23 DIAGNOSIS — E785 Hyperlipidemia, unspecified: Secondary | ICD-10-CM

## 2021-11-23 DIAGNOSIS — I428 Other cardiomyopathies: Secondary | ICD-10-CM | POA: Diagnosis not present

## 2021-11-23 DIAGNOSIS — E119 Type 2 diabetes mellitus without complications: Secondary | ICD-10-CM | POA: Diagnosis not present

## 2021-11-23 DIAGNOSIS — F17210 Nicotine dependence, cigarettes, uncomplicated: Secondary | ICD-10-CM | POA: Diagnosis not present

## 2021-11-23 DIAGNOSIS — Z79899 Other long term (current) drug therapy: Secondary | ICD-10-CM | POA: Insufficient documentation

## 2021-11-23 DIAGNOSIS — F1010 Alcohol abuse, uncomplicated: Secondary | ICD-10-CM

## 2021-11-23 LAB — BASIC METABOLIC PANEL
Anion gap: 9 (ref 5–15)
BUN: 12 mg/dL (ref 8–23)
CO2: 26 mmol/L (ref 22–32)
Calcium: 9.5 mg/dL (ref 8.9–10.3)
Chloride: 102 mmol/L (ref 98–111)
Creatinine, Ser: 1.1 mg/dL (ref 0.61–1.24)
GFR, Estimated: >60 mL/min (ref >60)
Glucose, Bld: 108 mg/dL — ABNORMAL HIGH (ref 70–99)
Potassium: 3.9 mmol/L (ref 3.5–5.1)
Sodium: 137 mmol/L (ref 135–145)

## 2021-11-23 LAB — DIGOXIN LEVEL: Digoxin Level: 0.3 ng/mL — ABNORMAL LOW (ref 0.8–2.0)

## 2021-11-23 NOTE — Addendum Note (Signed)
Encounter addended by: Rafael Bihari, FNP on: 11/23/2021 10:28 AM  Actions taken: Clinical Note Signed

## 2021-11-23 NOTE — Patient Instructions (Signed)
It was great to see you today! No medication changes are needed at this time.   Labs today We will only contact you if something comes back abnormal or we need to make some changes. Otherwise no news is good news!  Your physician has recommended that you have a cardiopulmonary stress test (CPX). CPX testing is a non-invasive measurement of heart and lung function. It replaces a traditional treadmill stress test. This type of test provides a tremendous amount of information that relates not only to your present condition but also for future outcomes. This test combines measurements of you ventilation, respiratory gas exchange in the lungs, electrocardiogram (EKG), blood pressure and physical response before, during, and following an exercise protocol.  Your physician has recommended that you establish with a primary care provider (PCP). Please see the attached list of providers     Your physician recommends that you schedule a follow-up appointment in: 6-8 weeks with Dr Aundra Dubin  Do the following things EVERYDAY: Weigh yourself in the morning before breakfast. Write it down and keep it in a log. Take your medicines as prescribed Eat low salt foods--Limit salt (sodium) to 2000 mg per day.  Stay as active as you can everyday Limit all fluids for the day to less than 2 liters   At the [facility], you and your health needs are our priority. As part of our continuing mission to provide you with exceptional heart care, we have created designated Provider Care Teams. These Care Teams include your primary Cardiologist (physician) and Advanced Practice Providers (APPs- Physician Assistants and Nurse Practitioners) who all work together to provide you with the care you need, when you need it.   You may see any of the following providers on your designated Care Team at your next follow up: Dr Glori Bickers Dr Haynes Kerns, NP Lyda Jester, [place] [facility] [place], [place] Audry Riles, PharmD   Please be sure to bring in all your medications bottles to every appointment.

## 2021-11-23 NOTE — Addendum Note (Signed)
Encounter addended by: Kerry Dory, CMA on: 11/23/2021 10:28 AM  Actions taken: Diagnosis association updated, Order list changed

## 2021-12-08 ENCOUNTER — Encounter (HOSPITAL_COMMUNITY): Payer: Medicare Other

## 2021-12-14 ENCOUNTER — Other Ambulatory Visit: Payer: Self-pay

## 2021-12-14 ENCOUNTER — Ambulatory Visit (HOSPITAL_COMMUNITY)
Admission: RE | Admit: 2021-12-14 | Discharge: 2021-12-14 | Disposition: A | Discharge status: Home / Self Care | Payer: Medicare Other | Source: Ambulatory Visit | Attending: Family Medicine | Admitting: Family Medicine

## 2021-12-14 ENCOUNTER — Encounter (HOSPITAL_COMMUNITY): Payer: Self-pay

## 2021-12-14 VITALS — BP 110/80 | HR 60 | Wt 147.4 lb

## 2021-12-14 DIAGNOSIS — I110 Hypertensive heart disease with heart failure: Secondary | ICD-10-CM | POA: Diagnosis not present

## 2021-12-14 DIAGNOSIS — F17210 Nicotine dependence, cigarettes, uncomplicated: Secondary | ICD-10-CM | POA: Insufficient documentation

## 2021-12-14 DIAGNOSIS — E785 Hyperlipidemia, unspecified: Secondary | ICD-10-CM

## 2021-12-14 DIAGNOSIS — F17200 Nicotine dependence, unspecified, uncomplicated: Secondary | ICD-10-CM

## 2021-12-14 DIAGNOSIS — I2510 Atherosclerotic heart disease of native coronary artery without angina pectoris: Secondary | ICD-10-CM

## 2021-12-14 DIAGNOSIS — F1010 Alcohol abuse, uncomplicated: Secondary | ICD-10-CM

## 2021-12-14 DIAGNOSIS — R079 Chest pain, unspecified: Secondary | ICD-10-CM | POA: Insufficient documentation

## 2021-12-14 DIAGNOSIS — I5022 Chronic systolic (congestive) heart failure: Secondary | ICD-10-CM | POA: Diagnosis not present

## 2021-12-14 DIAGNOSIS — Z79899 Other long term (current) drug therapy: Secondary | ICD-10-CM | POA: Diagnosis not present

## 2021-12-14 DIAGNOSIS — I739 Peripheral vascular disease, unspecified: Secondary | ICD-10-CM

## 2021-12-14 DIAGNOSIS — I428 Other cardiomyopathies: Secondary | ICD-10-CM | POA: Insufficient documentation

## 2021-12-14 DIAGNOSIS — R0609 Other forms of dyspnea: Secondary | ICD-10-CM | POA: Diagnosis not present

## 2021-12-14 NOTE — Progress Notes (Signed)
Patient ID: [name] [name], male   DOB: September 04, 1956, 66 y.o.   MRN: [id number] ? ?  ?Advanced Heart Failure Clinic Note  ? ?PCP: none ?HF Cardiologist: Dr. Aundra Dubin ? ?HPI: ?[name] [name] is a 66 y.o. with history of ETOH was found to have EF 10% by echo.  He had exertional dyspnea and chest pain, taken for [facility] 5/16 showing nonobstructive CAD, elevated filling pressures (but not marked elevation), and low cardiac index. He was admitted to optimize HF.  He was diuresed with IV lasix and transitioned to po lasix. Overall he diuresed 9 pounds. He continued on carvedilol, lisinopril, bidil, and spironolactone.  Since then, his HF meds have been titrated up.  ? ?Echo in 9/19 with EF 35-40%.  Echo in 5/21 showed EF 35-40%, moderate LVH, mildly decreased RV systolic function.  ? ?Follow up 10/22 he continued to struggle with medication compliance, NYHA I-II and volume stable. ? ?Echo 2/23 showed significantly decreased EF < 20%. (Had been off all GDMT x 1 month prior to echo). ? ?Follow up 2/23 had been off all meds x 1 month due to recent passing of his son. GDMT restarted. ? ?Today he returns for HF follow up. Overall feeling fine. Mild dyspnea with walking further distances on flat ground. Denies palpitations, CP, dizziness, edema, or PND/Orthopnea. Appetite ok. No fever or chills. Weight at home 147-148 pounds. Taking all medications. Smoking 1 ppd, ETOH 1-2/day, says he has cut way back.  ? ?ECG (personally reviewed): none ordered today. ? ?St Jude device interrogation (personally reviewed): Stable thoracic impedance, < 1% v-pacing, no VT/VF.   ? ?Labs (5/16): K 3.8, creatinine 0.94 => 1.19, HCT 48.4, LDL 121, HDL 50, digoxin 0.3, BNP 2343 => 367 ?Labs (6/16): K 4, creatinine 1.06 ?Labs (04/18/15): K 4.3 Creatinine 0.91 ?Labs (05/07/2015): K 4.5 Creatinine 0.88  ?Labs (10/16): K 4.6, creatinine 0.95, digoxin 0.5, TC 163 ?Labs (12/16) K 4.2, creatinine 0.90 ?Labs (2/17): K 3.8, creatinine 0.92 ?Labs (5/17): K 4, creatinine  0.92, digoxin 0.3 ?Labs (7/17): LFTs normal, LDL 70, HDL 34 ?Labs (8/17): digoxin 0.2, K 4.2, creatinine 0.88 ?Labs (1/18): digoxin 0.3, K 3.8, creatinine 0.88 ?Labs (12/18): K 4, creatinine 1.07, LDL 43, HDL 36 ?Labs (3/19): digoxin < 0.2, K 4, creatinine 0.97 ?Labs (1/21): hgb 15.4, digoxin < 0.2, K 4.2, 0.94 ?Labs (8/21): K 3.7, creatinine 0.89 ?Labs (7/22): LDL 169 ?Labs (8/22): K 3.8, creatinine 1.17 ?Labs (10/22): K 3.7, creatinine 0.79 ?Labs (2/23): K 3.9, creatinine 1.10, LDL 68 ? ?PMH: ?1. Low back pain ?2. HTN ?3. Hyperlipidemia ?4. H/o cocaine abuse: Not active. ?5. ETOH abuse: 1 pint/day but has not cut back.  ?6. CAD: Coronary CT angiogram in 9/09 showed possible significant obstructive disease in the circumflex.   ?- LHC (5/16): Nonobstructive CAD.  ?7. Cardiomyopathy: Echo (9/09) with EF 35-40%.  Echo (5/16) with EF 10%, mild LV dilation, grade II diastolic dysfunction, mildly dilated RV with moderately decreased systolic function.   LHC/RHC (5/16): Nonobstructive coronary disease, mean RA 10, PA 49/28, mean PCWP 25, CI 1.4.  Echo (9/16) with EF 20-25%, severe LV dilation. St Jude ICD.  ?- CPX 11/04/15: FVC 2.85 FEV1 1.98, MVV 89, Peak RER 0.94 (submaximal), peak VO2 13 (41.2% predicted), PJ/SR15 slope: 26.  This study was limited by hip pain.  ?- Echo 02/18/16 LVEF 25-30%, Grade 1 DD, Mild LAE, RV mildly reduced.  ?- Echo (7/18): EF 30-35%, mild RV dilation.  ?- Echo (9/19): EF 35-40%.  ?- Echo (5/21): EF  35-40%, moderate LVH, mildly decreased RV systolic function.  ?- Echo (2/23): EF < 20%, severely decreased LV dysfunction, global HK, Grade II DD, RV mildly reduced (off HF meds x 1 month). ?8. Active smoker ?9. PAD: ABI L normal, R mildly decreased.  ?- ABIs (8/18): Normal.  ?10. Type II diabetes: Borderline.  ? ?SH:  Lives in [place], works as Dealer, lives with son, smokes 1/2 ppd, drank 1 pint/day liquor => quit 5/16, prior cocaine use.  ? ?FH: Brother with MI in his 85s, another brother with  ?SCD, mother with CVA.  ? ?ROS: All systems reviewed and negative except as per HPI.  ? ?Current Outpatient Medications  ?Medication Sig Dispense Refill  ? aspirin 81 MG EC tablet TAKE 1 TABLET(81 MG) BY MOUTH DAILY. SWALLOW WHOLE 30 tablet 11  ? carvedilol (COREG) 25 MG tablet Take 1 tablet (25 mg total) by mouth 2 (two) times daily with a meal. 60 tablet 6  ? dapagliflozin propanediol (FARXIGA) 10 MG TABS tablet Take 1 tablet (10 mg total) by mouth daily before breakfast. 30 tablet 11  ? digoxin (LANOXIN) 0.125 MG tablet Take 1 tablet (0.125 mg total) by mouth daily. 90 tablet 3  ? ezetimibe (ZETIA) 10 MG tablet TAKE 1 TABLET(10 MG) BY MOUTH DAILY 90 tablet 3  ? isosorbide-hydrALAZINE (BIDIL) 20-37.5 MG tablet Take 2 tablets by mouth 3 (three) times daily. 180 tablet 11  ? metFORMIN (GLUCOPHAGE XR) 500 MG 24 hr tablet Take 1 tablet (500 mg total) by mouth daily with breakfast. 90 tablet 3  ? rosuvastatin (CRESTOR) 40 MG tablet TAKE 1 TABLET(40 MG) BY MOUTH DAILY 90 tablet 3  ? sacubitril-valsartan (ENTRESTO) 97-103 MG Take 1 tablet by mouth 2 (two) times daily. 60 tablet 6  ? spironolactone (ALDACTONE) 25 MG tablet TAKE 1 TABLET(25 MG) BY MOUTH DAILY 90 tablet 2  ? triamcinolone ointment (KENALOG) 0.5 % Apply 1 application topically 2 (two) times daily. 15 g 0  ? ?No current facility-administered medications for this encounter.  ? ?BP 110/80   Pulse 60   Wt 66.9 kg (147 lb 6.4 oz)   SpO2 97%   BMI 19.45 kg/m?  ? ?Wt Readings from Last 3 Encounters:  ?12/14/21 66.9 kg (147 lb 6.4 oz)  ?11/23/21 67.8 kg (149 lb 6.4 oz)  ?07/24/21 69 kg (152 lb 3.2 oz)  ?  ?PHYSICAL EXAM: ?General:  NAD. No resp difficulty ?HEENT: Normal ?Neck: Supple. No JVD. Carotids 2+ bilat; no bruits. No lymphadenopathy or thryomegaly appreciated. ?Cor: PMI nondisplaced. Regular rate & rhythm. No rubs, gallops or murmurs. ?Lungs: Clear ?Abdomen: Soft, nontender, nondistended. No hepatosplenomegaly. No bruits or masses. Good bowel  sounds. ?Extremities: No cyanosis, clubbing, rash, edema ?Neuro: Alert & oriented x 3, cranial nerves grossly intact. Moves all 4 extremities w/o difficulty. Affect pleasant. ? ?ASSESSMENT & PLAN: ?1. Chronic Systolic HF: Nonischemic cardiomyopathy with low output per RHC/LHC in 5/16.  Echo (9/16) with EF 20-25%, severe LV dilation s/p St Jude ICD.  Possible ETOH cardiomyopathy. Limited literacy. CPX (2/17) was submaximal due to hip pain so hard to interpret. Echo in 5/17 with LVEF 25-30%. Echo in 7/18 showed EF 30-35%. Echo in 9/19 and again in 5/21 showed EF 35-40%. Echo 2/23 showed EF < 20%. He is not volume overloaded on exam or by Corevue, NYHA class II symptoms.  ?- Continue Coreg 25 mg bid.   ?- Continue spironolactone 25 mg daily.  Recent labs ok on 11/23/21, SCr 1.10, K 3.9.  ?- Continue  Bidil 2 tabs tid.  ?- Continue Entresto 97/103 mg bid.   ?- Continue digoxin 0.125 daily. Dig level 0.3 on 11/23/21   ?- Continue dapagliflozin 10 mg daily.  ?- Has not needed lasix. Continue to use only as needed for weight gains of 3 lbs/24 hrs or 5 lbs/week.  ?- Repeat echo next visit now that he is back on GDMT. Discussed with Dr. Aundra Dubin, will defer CPX for now. ?2. ETOH Abuse: Former heavy drinker, now drinking 1-2 drinks/day. ?- Advised further cutting down and ideally stopping. ?3. Current Smoker: Continues to smoke, failed Chantix.  I encouraged him to quit.  ?4. PAD: No significant claudication now.  ABIs in 8/18 were normal.  ?- Encouraged smoking cessation.   ?- Continue ASA 81 and Crestor 40 daily.   ?5. Hyperlipidemia: Goal LDL < 70 with PAD. Good lipids 2/23. ?6. CAD: Nonobstructive on prior cath.  ? ?Followup in 2 months with Dr. Aundra Dubin + echo as scheduled. He was given a list of PCPs to establish care. ? ?Rafael Bihari, FNP ?12/14/2021 ?

## 2021-12-14 NOTE — Patient Instructions (Signed)
Thank you for coming in today ? ?Your physician recommends that you schedule a follow-up appointment in:  ?Please keep follow up with Dr. Aundra Dubin ? ?At the [facility], you and your health needs are our priority. As part of our continuing mission to provide you with exceptional heart care, we have created designated Provider Care Teams. These Care Teams include your primary Cardiologist (physician) and Advanced Practice Providers (APPs- Physician Assistants and Nurse Practitioners) who all work together to provide you with the care you need, when you need it.  ? ?You may see any of the following providers on your designated Care Team at your next follow up: ?Dr Glori Bickers ?Dr [name] ?Darrick Grinder, NP ?Lyda Jester, PA ?Jessica Milford,NP ?Marlyce Huge, PA ?Audry Riles, PharmD ? ? ?Please be sure to bring in all your medications bottles to every appointment.  ? ?If you have any questions or concerns before your next appointment please send [place] a message through [place] or call our office at [phone].   ? ?TO LEAVE A MESSAGE FOR THE NURSE SELECT OPTION 2, PLEASE LEAVE A MESSAGE INCLUDING: ?YOUR NAME ?DATE OF BIRTH ?CALL BACK NUMBER ?REASON FOR CALL**this is important as we prioritize the call backs ? ?YOU WILL RECEIVE A CALL BACK THE SAME DAY AS LONG AS YOU CALL BEFORE 4:00 PM ? ?

## 2022-01-21 ENCOUNTER — Ambulatory Visit (INDEPENDENT_AMBULATORY_CARE_PROVIDER_SITE_OTHER): Payer: Medicare Other

## 2022-01-21 DIAGNOSIS — I428 Other cardiomyopathies: Secondary | ICD-10-CM

## 2022-01-21 LAB — CUP PACEART REMOTE DEVICE CHECK
Battery Remaining Longevity: 52 mo
Battery Remaining Percentage: 50 %
Battery Voltage: 2.93 V
Brady Statistic RV Percent Paced: 1 %
Date Time Interrogation Session: 20230427020016
HighPow Impedance: 64 Ohm
HighPow Impedance: 64 Ohm
Implantable Lead Implant Date: 20161012
Implantable Lead Location: 753860
Implantable Lead Model: 7122
Implantable Pulse Generator Implant Date: 20161012
Lead Channel Impedance Value: 450 Ohm
Lead Channel Pacing Threshold Amplitude: 0.75 V
Lead Channel Pacing Threshold Pulse Width: 0.5 ms
Lead Channel Sensing Intrinsic Amplitude: 11.8 mV
Lead Channel Setting Pacing Amplitude: 2.5 V
Lead Channel Setting Pacing Pulse Width: 0.5 ms
Lead Channel Setting Sensing Sensitivity: 0.5 mV
Pulse Gen Serial Number: 7308640

## 2022-02-05 NOTE — Progress Notes (Signed)
Remote ICD transmission.   

## 2022-02-16 ENCOUNTER — Ambulatory Visit (HOSPITAL_COMMUNITY)
Admission: RE | Admit: 2022-02-16 | Discharge: 2022-02-16 | Disposition: A | Discharge status: Home / Self Care | Payer: Medicare Other | Source: Ambulatory Visit | Attending: Cardiology | Admitting: Cardiology

## 2022-02-16 ENCOUNTER — Ambulatory Visit (HOSPITAL_BASED_OUTPATIENT_CLINIC_OR_DEPARTMENT_OTHER)
Admission: RE | Admit: 2022-02-16 | Discharge: 2022-02-16 | Disposition: A | Discharge status: Home / Self Care | Payer: Medicare Other | Source: Ambulatory Visit | Attending: Internal Medicine | Admitting: Internal Medicine

## 2022-02-16 DIAGNOSIS — F17210 Nicotine dependence, cigarettes, uncomplicated: Secondary | ICD-10-CM | POA: Diagnosis not present

## 2022-02-16 DIAGNOSIS — Z95810 Presence of automatic (implantable) cardiac defibrillator: Secondary | ICD-10-CM | POA: Diagnosis not present

## 2022-02-16 DIAGNOSIS — Z7902 Long term (current) use of antithrombotics/antiplatelets: Secondary | ICD-10-CM | POA: Diagnosis not present

## 2022-02-16 DIAGNOSIS — I5022 Chronic systolic (congestive) heart failure: Secondary | ICD-10-CM | POA: Diagnosis present

## 2022-02-16 DIAGNOSIS — I2510 Atherosclerotic heart disease of native coronary artery without angina pectoris: Secondary | ICD-10-CM | POA: Diagnosis not present

## 2022-02-16 DIAGNOSIS — I739 Peripheral vascular disease, unspecified: Secondary | ICD-10-CM | POA: Insufficient documentation

## 2022-02-16 DIAGNOSIS — I428 Other cardiomyopathies: Secondary | ICD-10-CM | POA: Diagnosis not present

## 2022-02-16 DIAGNOSIS — E785 Hyperlipidemia, unspecified: Secondary | ICD-10-CM | POA: Diagnosis not present

## 2022-02-16 DIAGNOSIS — Z7982 Long term (current) use of aspirin: Secondary | ICD-10-CM | POA: Insufficient documentation

## 2022-02-16 DIAGNOSIS — I110 Hypertensive heart disease with heart failure: Secondary | ICD-10-CM | POA: Diagnosis not present

## 2022-02-16 DIAGNOSIS — Z79899 Other long term (current) drug therapy: Secondary | ICD-10-CM | POA: Diagnosis not present

## 2022-02-16 LAB — BASIC METABOLIC PANEL
Anion gap: 8 (ref 5–15)
BUN: 11 mg/dL (ref 8–23)
CO2: 27 mmol/L (ref 22–32)
Calcium: 9.5 mg/dL (ref 8.9–10.3)
Chloride: 105 mmol/L (ref 98–111)
Creatinine, Ser: 0.97 mg/dL (ref 0.61–1.24)
GFR, Estimated: >60 mL/min (ref >60)
Glucose, Bld: 117 mg/dL — ABNORMAL HIGH (ref 70–99)
Potassium: 3.4 mmol/L — ABNORMAL LOW (ref 3.5–5.1)
Sodium: 140 mmol/L (ref 135–145)

## 2022-02-16 LAB — ECHOCARDIOGRAM COMPLETE
Area-P 1/2: 2.64 cm2
Calc EF: 35.8 %
S' Lateral: 4.2 cm
Single Plane A2C EF: 26.2 %
Single Plane A4C EF: 43.2 %

## 2022-02-16 LAB — DIGOXIN LEVEL: Digoxin Level: 0.4 ng/mL — ABNORMAL LOW (ref 0.8–2.0)

## 2022-02-16 MED ORDER — SPIRONOLACTONE 50 MG PO TABS: 50.0000 mg | ORAL_TABLET | Freq: Every day | ORAL | 3 refills | Status: DC

## 2022-02-16 NOTE — Patient Instructions (Addendum)
EKG done today.  Labs done today. We will contact you only if your labs are abnormal.  INCREASE Spironolactone to '50mg'$  (1 tablet) by mouth daily.   No other medication changes were made. Please continue all current medications as prescribed.  Your physician has recommended that you have a cardiopulmonary stress test (CPX). CPX testing is a non-invasive measurement of heart and lung function. It replaces a traditional treadmill stress test. This type of test provides a tremendous amount of information that relates not only to your present condition but also for future outcomes. This test combines measurements of you ventilation, respiratory gas exchange in the lungs, electrocardiogram (EKG), blood pressure and physical response before, during, and following an exercise protocol.  Your physician recommends that you schedule a follow-up appointment in: 10 days for a lab only appointment and in 3 months with our NP/PA Clinic here in our office.  If you have any questions or concerns before your next appointment please send [place] a message through [place] or call our office at [phone].    TO LEAVE A MESSAGE FOR THE NURSE SELECT OPTION 2, PLEASE LEAVE A MESSAGE INCLUDING: YOUR NAME DATE OF BIRTH CALL BACK NUMBER REASON FOR CALL**this is important as we prioritize the call backs  YOU WILL RECEIVE A CALL BACK THE SAME DAY AS LONG AS YOU CALL BEFORE 4:00 PM   Do the following things EVERYDAY: Weigh yourself in the morning before breakfast. Write it down and keep it in a log. Take your medicines as prescribed Eat low salt foods--Limit salt (sodium) to 2000 mg per day.  Stay as active as you can everyday Limit all fluids for the day to less than 2 liters   At the [facility], you and your health needs are our priority. As part of our continuing mission to provide you with exceptional heart care, we have created designated Provider Care Teams. These Care Teams include your  primary Cardiologist (physician) and Advanced Practice Providers (APPs- Physician Assistants and Nurse Practitioners) who all work together to provide you with the care you need, when you need it.   You may see any of the following providers on your designated Care Team at your next follow up: Dr Glori Bickers Dr Haynes Kerns, NP Lyda Jester, [place] Audry Riles, PharmD   Please be sure to bring in all your medications bottles to every appointment.

## 2022-02-16 NOTE — Progress Notes (Signed)
Patient ID: [name] [name], male   DOB: 09-17-1956, 66 y.o.   MRN: [id number]    Advanced Heart Failure Clinic Note   PCP: none HF Cardiologist: Dr. Aundra Dubin  HPI: [name] [name] is a 66 y.o. with history of ETOH was found to have EF 10% by echo.  He had exertional dyspnea and chest pain, taken for [facility] 5/16 showing nonobstructive CAD, elevated filling pressures (but not marked elevation), and low cardiac index. He was admitted to optimize HF.  He was diuresed with IV lasix and transitioned to po lasix. Overall he diuresed 9 pounds. He continued on carvedilol, lisinopril, bidil, and spironolactone.  Since then, his HF meds have been titrated up.   Echo in 9/19 with EF 35-40%.  Echo in 5/21 showed EF 35-40%, moderate LVH, mildly decreased RV systolic function.   Follow up 10/22 he continued to struggle with medication compliance, NYHA I-II and volume stable.  Echo 2/23 showed significantly decreased EF < 20%. (Had been off all GDMT x 1 month prior to echo).  Follow up 2/23 had been off all meds x 1 month due to recent passing of his son. GDMT restarted.  Echo was done today and reviewed, EF 25%, diffuse hypokinesis, mildly decreased RV systolic function.   Today he returns for HF follow up. He is drinking ETOH rarely.  He is still smoking 1 ppd and is not ready to quit.  No significant exertional dyspnea though he is not particularly active.  No orthopnea/PND.  No chest pain.  Weight is stable.   ECG (personally reviewed): NSR, LVH with repolarization abnormality.  St Jude device interrogation (personally reviewed): Recent fall in impedance, now back to baseline.  No VT.     Labs (5/16): K 3.8, creatinine 0.94 => 1.19, HCT 48.4, LDL 121, HDL 50, digoxin 0.3, BNP 2343 => 367 Labs (6/16): K 4, creatinine 1.06 Labs (04/18/15): K 4.3 Creatinine 0.91 Labs (05/07/2015): K 4.5 Creatinine 0.88  Labs (10/16): K 4.6, creatinine 0.95, digoxin 0.5, TC 163 Labs (12/16) K 4.2, creatinine 0.90 Labs  (2/17): K 3.8, creatinine 0.92 Labs (5/17): K 4, creatinine 0.92, digoxin 0.3 Labs (7/17): LFTs normal, LDL 70, HDL 34 Labs (8/17): digoxin 0.2, K 4.2, creatinine 0.88 Labs (1/18): digoxin 0.3, K 3.8, creatinine 0.88 Labs (12/18): K 4, creatinine 1.07, LDL 43, HDL 36 Labs (3/19): digoxin < 0.2, K 4, creatinine 0.97 Labs (1/21): hgb 15.4, digoxin < 0.2, K 4.2, 0.94 Labs (8/21): K 3.7, creatinine 0.89 Labs (7/22): LDL 169 Labs (8/22): K 3.8, creatinine 1.17 Labs (10/22): K 3.7, creatinine 0.79 Labs (2/23): K 3.9, creatinine 1.10, LDL 68, digoxin 0.3  PMH: 1. Low back pain 2. HTN 3. Hyperlipidemia 4. H/o cocaine abuse: Not active. 5. ETOH abuse: 1 pint/day but has not cut back.  6. CAD: Coronary CT angiogram in 9/09 showed possible significant obstructive disease in the circumflex.   - LHC (5/16): Nonobstructive CAD.  7. Cardiomyopathy: St Jude ICD.  Echo (9/09) with EF 35-40%.  Echo (5/16) with EF 10%, mild LV dilation, grade II diastolic dysfunction, mildly dilated RV with moderately decreased systolic function.   LHC/RHC (5/16): Nonobstructive coronary disease, mean RA 10, PA 49/28, mean PCWP 25, CI 1.4.  Echo (9/16) with EF 20-25%, severe LV dilation. - CPX 11/04/15: FVC 2.85 FEV1 1.98, MVV 89, Peak RER 0.94 (submaximal), peak VO2 13 (41.2% predicted), HE/RD40 slope: 26.  This study was limited by hip pain.  - Echo 02/18/16 LVEF 25-30%, Grade 1 DD, Mild LAE, RV mildly  reduced.  - Echo (7/18): EF 30-35%, mild RV dilation.  - Echo (9/19): EF 35-40%.  - Echo (5/21): EF 35-40%, moderate LVH, mildly decreased RV systolic function.  - Echo (2/23): EF < 20%, severely decreased LV dysfunction, global HK, Grade II DD, RV mildly reduced (off HF meds x 1 month). - Echo (5/23): EF 25%, diffuse hypokinesis, mildly decreased RV systolic function.  8. Active smoker 9. PAD: ABI L normal, R mildly decreased.  - ABIs (8/18): Normal.  10. Type II diabetes: Borderline.   SH:  Lives in [place], works  as Dealer, lives with son, smokes 1/2 ppd, drank 1 pint/day liquor => quit 5/16, prior cocaine use.   FH: Brother with MI in his 44s, another brother with ?SCD, mother with CVA.   ROS: All systems reviewed and negative except as per HPI.   Current Outpatient Medications  Medication Sig Dispense Refill   aspirin 81 MG EC tablet TAKE 1 TABLET(81 MG) BY MOUTH DAILY. SWALLOW WHOLE 30 tablet 11   carvedilol (COREG) 25 MG tablet Take 1 tablet (25 mg total) by mouth 2 (two) times daily with a meal. 60 tablet 6   dapagliflozin propanediol (FARXIGA) 10 MG TABS tablet Take 1 tablet (10 mg total) by mouth daily before breakfast. 30 tablet 11   digoxin (LANOXIN) 0.125 MG tablet Take 1 tablet (0.125 mg total) by mouth daily. 90 tablet 3   ezetimibe (ZETIA) 10 MG tablet TAKE 1 TABLET(10 MG) BY MOUTH DAILY 90 tablet 3   isosorbide-hydrALAZINE (BIDIL) 20-37.5 MG tablet Take 2 tablets by mouth 3 (three) times daily. 180 tablet 11   metFORMIN (GLUCOPHAGE XR) 500 MG 24 hr tablet Take 1 tablet (500 mg total) by mouth daily with breakfast. 90 tablet 3   rosuvastatin (CRESTOR) 40 MG tablet TAKE 1 TABLET(40 MG) BY MOUTH DAILY 90 tablet 3   sacubitril-valsartan (ENTRESTO) 97-103 MG Take 1 tablet by mouth 2 (two) times daily. 60 tablet 6   triamcinolone ointment (KENALOG) 0.5 % Apply 1 application topically 2 (two) times daily. 15 g 0   spironolactone (ALDACTONE) 50 MG tablet Take 1 tablet (50 mg total) by mouth daily. 90 tablet 3   No current facility-administered medications for this encounter.   BP (!) 143/72   Pulse 65   Wt 66.7 kg (147 lb)   SpO2 93%   BMI 19.39 kg/m   Wt Readings from Last 3 Encounters:  02/16/22 66.7 kg (147 lb)  12/14/21 66.9 kg (147 lb 6.4 oz)  11/23/21 67.8 kg (149 lb 6.4 oz)    General: NAD Neck: No JVD, no thyromegaly or thyroid nodule.  Lungs: Clear to auscultation bilaterally with normal respiratory effort. CV: Nondisplaced PMI.  Heart regular S1/S2, no S3/S4, no murmur.   No peripheral edema.  No carotid bruit.  Normal pedal pulses.  Abdomen: Soft, nontender, no hepatosplenomegaly, no distention.  Skin: Intact without lesions or rashes.  Neurologic: Alert and oriented x 3.  Psych: Normal affect. Extremities: No clubbing or cyanosis.  HEENT: Normal.   ASSESSMENT & PLAN: 1. Chronic Systolic HF: Nonischemic cardiomyopathy with low output per RHC/LHC in 5/16.  Echo (9/16) with EF 20-25%, severe LV dilation s/p St Jude ICD.  Possible ETOH cardiomyopathy. Limited literacy. CPX (2/17) was submaximal due to hip pain so hard to interpret. Echo in 5/17 with LVEF 25-30%. Echo in 7/18 showed EF 30-35%. Echo in 9/19 and again in 5/21 showed EF 35-40%. Echo 2/23 showed EF < 20%. Echo today showed EF 25%, diffuse  hypokinesis, mildly decreased RV systolic function.  He is not volume overloaded on exam or by Corevue, NYHA class II.   - Continue Coreg 25 mg bid.   - Increase spironolactone to 50 mg daily, BMET today and in 10 days.  - Continue Bidil 2 tabs tid.  - Continue Entresto 97/103 mg bid.   - Continue digoxin 0.125 daily. Check digoxin level.    - Continue dapagliflozin 10 mg daily.  - Has not needed lasix. Continue to use only as needed for weight gains of 3 lbs/24 hrs or 5 lbs/week.  - I will arrange for CPX.  2. ETOH Abuse: Former heavy drinker, now drinking rarely.  3. Current Smoker: Continues to smoke, failed Chantix.  I encouraged him to quit but he is not ready.  4. PAD: No significant claudication now.  ABIs in 8/18 were normal.  - Encouraged smoking cessation.   - Continue ASA 81 and Crestor 40/Zetia 10 daily.   5. Hyperlipidemia: Goal LDL < 70 with PAD. Good lipids 2/23. 6. CAD: Nonobstructive on prior cath.   Followup in 3 months with APP.   [name] 02/16/2022

## 2022-02-16 NOTE — Progress Notes (Signed)
  Echocardiogram 2D Echocardiogram has been performed.  Darlina Sicilian M 02/16/2022, 10:03 AM

## 2022-02-26 ENCOUNTER — Other Ambulatory Visit (HOSPITAL_COMMUNITY): Payer: Medicare Other

## 2022-03-02 ENCOUNTER — Encounter (HOSPITAL_COMMUNITY): Payer: Medicare Other

## 2022-04-22 ENCOUNTER — Ambulatory Visit (INDEPENDENT_AMBULATORY_CARE_PROVIDER_SITE_OTHER): Payer: Medicare Other

## 2022-04-22 DIAGNOSIS — I428 Other cardiomyopathies: Secondary | ICD-10-CM | POA: Diagnosis not present

## 2022-04-22 LAB — CUP PACEART REMOTE DEVICE CHECK
Battery Remaining Longevity: 50 mo
Battery Remaining Percentage: 48 %
Battery Voltage: 2.93 V
Brady Statistic RV Percent Paced: 1 %
Date Time Interrogation Session: 20230727020016
HighPow Impedance: 65 Ohm
HighPow Impedance: 65 Ohm
Implantable Lead Implant Date: 20161012
Implantable Lead Location: 753860
Implantable Lead Model: 7122
Implantable Pulse Generator Implant Date: 20161012
Lead Channel Impedance Value: 450 Ohm
Lead Channel Pacing Threshold Amplitude: 0.75 V
Lead Channel Pacing Threshold Pulse Width: 0.5 ms
Lead Channel Sensing Intrinsic Amplitude: 12 mV
Lead Channel Setting Pacing Amplitude: 2.5 V
Lead Channel Setting Pacing Pulse Width: 0.5 ms
Lead Channel Setting Sensing Sensitivity: 0.5 mV
Pulse Gen Serial Number: 7308640

## 2022-05-13 NOTE — Progress Notes (Signed)
Remote ICD transmission.   

## 2022-05-19 ENCOUNTER — Encounter (HOSPITAL_COMMUNITY): Payer: Medicare Other

## 2022-07-22 ENCOUNTER — Ambulatory Visit (INDEPENDENT_AMBULATORY_CARE_PROVIDER_SITE_OTHER): Payer: Medicare Other

## 2022-07-22 DIAGNOSIS — I428 Other cardiomyopathies: Secondary | ICD-10-CM

## 2022-07-22 LAB — CUP PACEART REMOTE DEVICE CHECK
Battery Remaining Longevity: 47 mo
Battery Remaining Percentage: 46 %
Battery Voltage: 2.92 V
Brady Statistic RV Percent Paced: 1 %
Date Time Interrogation Session: 20231026020015
HighPow Impedance: 70 Ohm
HighPow Impedance: 70 Ohm
Implantable Lead Connection Status: 753985
Implantable Lead Implant Date: 20161012
Implantable Lead Location: 753860
Implantable Lead Model: 7122
Implantable Pulse Generator Implant Date: 20161012
Lead Channel Impedance Value: 430 Ohm
Lead Channel Pacing Threshold Amplitude: 0.75 V
Lead Channel Pacing Threshold Pulse Width: 0.5 ms
Lead Channel Sensing Intrinsic Amplitude: 12 mV
Lead Channel Setting Pacing Amplitude: 2.5 V
Lead Channel Setting Pacing Pulse Width: 0.5 ms
Lead Channel Setting Sensing Sensitivity: 0.5 mV
Pulse Gen Serial Number: 7308640

## 2022-08-02 NOTE — Progress Notes (Signed)
Remote ICD transmission.   

## 2022-09-14 ENCOUNTER — Ambulatory Visit (HOSPITAL_COMMUNITY): Payer: Medicare Other

## 2022-09-14 ENCOUNTER — Encounter (HOSPITAL_COMMUNITY): Payer: Self-pay

## 2022-09-14 ENCOUNTER — Ambulatory Visit (INDEPENDENT_AMBULATORY_CARE_PROVIDER_SITE_OTHER)
Admission: RE | Admit: 2022-09-14 | Discharge: 2022-09-14 | Disposition: A | Discharge status: Home / Self Care | Payer: Medicare Other | Source: Ambulatory Visit

## 2022-09-14 VITALS — BP 145/103 | HR 90 | Temp 97.9°F | Resp 18

## 2022-09-14 DIAGNOSIS — R531 Weakness: Secondary | ICD-10-CM | POA: Insufficient documentation

## 2022-09-14 DIAGNOSIS — C221 Intrahepatic bile duct carcinoma: Secondary | ICD-10-CM | POA: Diagnosis not present

## 2022-09-14 DIAGNOSIS — R197 Diarrhea, unspecified: Secondary | ICD-10-CM | POA: Insufficient documentation

## 2022-09-14 DIAGNOSIS — E860 Dehydration: Secondary | ICD-10-CM | POA: Insufficient documentation

## 2022-09-14 LAB — COMPREHENSIVE METABOLIC PANEL
ALT: 77 U/L — ABNORMAL HIGH (ref 0–44)
AST: 133 U/L — ABNORMAL HIGH (ref 15–41)
Albumin: 2.2 g/dL — ABNORMAL LOW (ref 3.5–5.0)
Alkaline Phosphatase: 861 U/L — ABNORMAL HIGH (ref 38–126)
Anion gap: 18 — ABNORMAL HIGH (ref 5–15)
BUN: 13 mg/dL (ref 8–23)
CO2: 22 mmol/L (ref 22–32)
Calcium: 9.4 mg/dL (ref 8.9–10.3)
Chloride: 94 mmol/L — ABNORMAL LOW (ref 98–111)
Creatinine, Ser: 1.65 mg/dL — ABNORMAL HIGH (ref 0.61–1.24)
GFR, Estimated: 45 mL/min — ABNORMAL LOW (ref >60)
Glucose, Bld: 123 mg/dL — ABNORMAL HIGH (ref 70–99)
Potassium: 2.9 mmol/L — ABNORMAL LOW (ref 3.5–5.1)
Sodium: 134 mmol/L — ABNORMAL LOW (ref 135–145)
Total Bilirubin: 42 mg/dL (ref 0.3–1.2)
Total Protein: 6.5 g/dL (ref 6.5–8.1)

## 2022-09-14 LAB — CBC WITH DIFFERENTIAL/PLATELET
Abs Immature Granulocytes: 0.17 10*3/uL — ABNORMAL HIGH (ref 0.00–0.07)
Basophils Absolute: 0 10*3/uL (ref 0.0–0.1)
Basophils Relative: 0 %
Eosinophils Absolute: 0 10*3/uL (ref 0.0–0.5)
Eosinophils Relative: 0 %
HCT: 40.3 % (ref 39.0–52.0)
Hemoglobin: 14.6 g/dL (ref 13.0–17.0)
Immature Granulocytes: 1 %
Lymphocytes Relative: 7 %
Lymphs Abs: 1.1 10*3/uL (ref 0.7–4.0)
MCH: 33.6 pg (ref 26.0–34.0)
MCHC: 36.2 g/dL — ABNORMAL HIGH (ref 30.0–36.0)
MCV: 92.6 fL (ref 80.0–100.0)
Monocytes Absolute: 1 10*3/uL (ref 0.1–1.0)
Monocytes Relative: 6 %
Neutro Abs: 14.4 10*3/uL — ABNORMAL HIGH (ref 1.7–7.7)
Neutrophils Relative %: 86 %
Platelets: 421 10*3/uL — ABNORMAL HIGH (ref 150–400)
RBC: 4.35 MIL/uL (ref 4.22–5.81)
RDW: 17.2 % — ABNORMAL HIGH (ref 11.5–15.5)
WBC: 16.7 10*3/uL — ABNORMAL HIGH (ref 4.0–10.5)
nRBC: 0 % (ref 0.0–0.2)

## 2022-09-14 LAB — LIPASE, BLOOD: Lipase: 27 U/L (ref 11–51)

## 2022-09-14 MED ORDER — ACETAMINOPHEN 325 MG PO TABS
650.0000 mg | ORAL_TABLET | Freq: Once | ORAL | Status: AC
  Administered 2022-09-14: 650 mg via ORAL

## 2022-09-14 MED ORDER — ACETAMINOPHEN 325 MG PO TABS
ORAL_TABLET | ORAL | Status: AC
  Filled 2022-09-14: qty 2

## 2022-09-14 NOTE — ED Triage Notes (Signed)
Pt c/o diarrhea, weakness, and no appetite x2wks.  Pt c/o pain to LLQ at this time. States has a new PCP appt on 12/27.

## 2022-09-14 NOTE — Discharge Instructions (Signed)
It's unclear what is causing your diarrhea.  I would like for you to bring back a stool sample to the clinic so that we can see if there is any abnormal bacteria in your poop.   We drew blood work in the clinic.  You may hear from [place] tomorrow if any of your blood work is abnormal requiring a visit to the ER.  If you become more weak, fall, passout, or develop any new or worsening symptoms, I would like for you to go to the nearest emergency department for further evaluation.  Otherwise, follow-up with PCP as scheduled on September 22, 2022.

## 2022-09-15 ENCOUNTER — Other Ambulatory Visit: Payer: Self-pay

## 2022-09-15 ENCOUNTER — Emergency Department (HOSPITAL_COMMUNITY): Payer: Medicare Other

## 2022-09-15 ENCOUNTER — Encounter (HOSPITAL_COMMUNITY): Payer: Self-pay | Admitting: Emergency Medicine

## 2022-09-15 ENCOUNTER — Inpatient Hospital Stay (HOSPITAL_COMMUNITY)
Admission: EM | Admit: 2022-09-15 | Discharge: 2022-09-19 | DRG: 435 | Disposition: A | Discharge status: Home Health | Payer: Medicare Other | Attending: Internal Medicine | Admitting: Internal Medicine

## 2022-09-15 DIAGNOSIS — R197 Diarrhea, unspecified: Secondary | ICD-10-CM | POA: Diagnosis present

## 2022-09-15 DIAGNOSIS — D72829 Elevated white blood cell count, unspecified: Secondary | ICD-10-CM | POA: Diagnosis present

## 2022-09-15 DIAGNOSIS — Z7982 Long term (current) use of aspirin: Secondary | ICD-10-CM

## 2022-09-15 DIAGNOSIS — N179 Acute kidney failure, unspecified: Secondary | ICD-10-CM

## 2022-09-15 DIAGNOSIS — I10 Essential (primary) hypertension: Secondary | ICD-10-CM | POA: Diagnosis present

## 2022-09-15 DIAGNOSIS — R634 Abnormal weight loss: Secondary | ICD-10-CM | POA: Diagnosis present

## 2022-09-15 DIAGNOSIS — C221 Intrahepatic bile duct carcinoma: Principal | ICD-10-CM | POA: Diagnosis present

## 2022-09-15 DIAGNOSIS — I739 Peripheral vascular disease, unspecified: Secondary | ICD-10-CM | POA: Diagnosis present

## 2022-09-15 DIAGNOSIS — K838 Other specified diseases of biliary tract: Secondary | ICD-10-CM

## 2022-09-15 DIAGNOSIS — Z7984 Long term (current) use of oral hypoglycemic drugs: Secondary | ICD-10-CM

## 2022-09-15 DIAGNOSIS — Z95810 Presence of automatic (implantable) cardiac defibrillator: Secondary | ICD-10-CM

## 2022-09-15 DIAGNOSIS — E861 Hypovolemia: Secondary | ICD-10-CM | POA: Diagnosis present

## 2022-09-15 DIAGNOSIS — E876 Hypokalemia: Secondary | ICD-10-CM

## 2022-09-15 DIAGNOSIS — I2510 Atherosclerotic heart disease of native coronary artery without angina pectoris: Secondary | ICD-10-CM | POA: Diagnosis present

## 2022-09-15 DIAGNOSIS — E222 Syndrome of inappropriate secretion of antidiuretic hormone: Secondary | ICD-10-CM | POA: Diagnosis present

## 2022-09-15 DIAGNOSIS — Z823 Family history of stroke: Secondary | ICD-10-CM

## 2022-09-15 DIAGNOSIS — E860 Dehydration: Secondary | ICD-10-CM | POA: Diagnosis present

## 2022-09-15 DIAGNOSIS — I959 Hypotension, unspecified: Secondary | ICD-10-CM | POA: Diagnosis present

## 2022-09-15 DIAGNOSIS — K831 Obstruction of bile duct: Secondary | ICD-10-CM | POA: Diagnosis present

## 2022-09-15 DIAGNOSIS — I110 Hypertensive heart disease with heart failure: Secondary | ICD-10-CM | POA: Diagnosis present

## 2022-09-15 DIAGNOSIS — Z79899 Other long term (current) drug therapy: Secondary | ICD-10-CM

## 2022-09-15 DIAGNOSIS — C801 Malignant (primary) neoplasm, unspecified: Secondary | ICD-10-CM

## 2022-09-15 DIAGNOSIS — Z681 Body mass index (BMI) 19 or less, adult: Secondary | ICD-10-CM

## 2022-09-15 DIAGNOSIS — I428 Other cardiomyopathies: Secondary | ICD-10-CM

## 2022-09-15 DIAGNOSIS — I5022 Chronic systolic (congestive) heart failure: Secondary | ICD-10-CM | POA: Diagnosis present

## 2022-09-15 DIAGNOSIS — R933 Abnormal findings on diagnostic imaging of other parts of digestive tract: Secondary | ICD-10-CM

## 2022-09-15 DIAGNOSIS — F1010 Alcohol abuse, uncomplicated: Secondary | ICD-10-CM | POA: Diagnosis present

## 2022-09-15 DIAGNOSIS — F17210 Nicotine dependence, cigarettes, uncomplicated: Secondary | ICD-10-CM | POA: Diagnosis present

## 2022-09-15 LAB — CBC WITH DIFFERENTIAL/PLATELET
Abs Immature Granulocytes: 0.23 10*3/uL — ABNORMAL HIGH (ref 0.00–0.07)
Basophils Absolute: 0 10*3/uL (ref 0.0–0.1)
Basophils Relative: 0 %
Eosinophils Absolute: 0 10*3/uL (ref 0.0–0.5)
Eosinophils Relative: 0 %
HCT: 40.4 % (ref 39.0–52.0)
Hemoglobin: 13.9 g/dL (ref 13.0–17.0)
Immature Granulocytes: 1 %
Lymphocytes Relative: 6 %
Lymphs Abs: 1 10*3/uL (ref 0.7–4.0)
MCH: 32.9 pg (ref 26.0–34.0)
MCHC: 34.4 g/dL (ref 30.0–36.0)
MCV: 95.7 fL (ref 80.0–100.0)
Monocytes Absolute: 1.1 10*3/uL — ABNORMAL HIGH (ref 0.1–1.0)
Monocytes Relative: 6 %
Neutro Abs: 16.2 10*3/uL — ABNORMAL HIGH (ref 1.7–7.7)
Neutrophils Relative %: 87 %
Platelets: 377 10*3/uL (ref 150–400)
RBC: 4.22 MIL/uL (ref 4.22–5.81)
RDW: 17.2 % — ABNORMAL HIGH (ref 11.5–15.5)
WBC: 18.7 10*3/uL — ABNORMAL HIGH (ref 4.0–10.5)
nRBC: 0 % (ref 0.0–0.2)

## 2022-09-15 LAB — I-STAT CHEM 8, ED
BUN: 17 mg/dL (ref 8–23)
Calcium, Ion: 1.08 mmol/L — ABNORMAL LOW (ref 1.15–1.40)
Chloride: 99 mmol/L (ref 98–111)
Creatinine, Ser: 1.7 mg/dL — ABNORMAL HIGH (ref 0.61–1.24)
Glucose, Bld: 138 mg/dL — ABNORMAL HIGH (ref 70–99)
HCT: 46 % (ref 39.0–52.0)
Hemoglobin: 15.6 g/dL (ref 13.0–17.0)
Potassium: 2.8 mmol/L — ABNORMAL LOW (ref 3.5–5.1)
Sodium: 135 mmol/L (ref 135–145)
TCO2: 24 mmol/L (ref 22–32)

## 2022-09-15 LAB — LACTIC ACID, PLASMA: Lactic Acid, Venous: 2.3 mmol/L (ref 0.5–1.9)

## 2022-09-15 NOTE — ED Notes (Signed)
Pt declined getting vitals taken

## 2022-09-15 NOTE — ED Provider Triage Note (Addendum)
Emergency Medicine Provider Triage Evaluation Note  [name] [name] , a 66 y.o. male  was evaluated in triage.  Pt complains of left-sided abdominal pain, diarrhea x 2 weeks and scleral icterus started a week ago.  Patient does not drink any alcohol, he does have a history of heart failure.  He was seen yesterday at the urgent care, they did basic labs and sent him to the ED because of leukocytosis, bilirubinemia and elevated LFTs.  dium 135 - 145 mmol/L 134 Low  140 137 141 135 139 140  Potassium 3.5 - 5.1 mmol/L 2.9 Low  3.4 Low  3.9 3.7 3.8 3.7 3.7  Chloride 98 - 111 mmol/L 94 Low  105 102 104 100 101 106  CO2 22 - 32 mmol/L '22 27 26 28 26 27 25  '$ Glucose, Bld 70 - 99 mg/dL 123 High  117 High  CM 108 High  CM 89 CM 128 High  CM 103 High  CM 99 CM  Comment: Glucose reference range applies only to samples taken after fasting for at least 8 hours.  BUN 8 - 23 mg/dL '13 11 12 9 12 10 13  '$ Creatinine, Ser 0.61 - 1.24 mg/dL 1.65 High  0.97 1.10 0.79 1.17 1.08 0.89  Comment: ICTERUS AT THIS LEVEL MAY AFFECT RESULT  Calcium 8.9 - 10.3 mg/dL 9.4 9.5 9.5 9.4 9.3 9.6 9.2  Total Protein 6.5 - 8.1 g/dL 6.5        Albumin 3.5 - 5.0 g/dL 2.2 Low         AST 15 - 41 U/L 133 High         ALT 0 - 44 U/L 77 High         Alkaline Phosphatase 38 - 126 U/L 861 High         Total Bilirubin 0.3 - 1.2 mg/dL 42.0 High Panic           .  Review of Systems  Per HPI  Physical Exam  BP 110/74   Pulse (!) 53   Temp 98.2 F (36.8 C)   Resp 16   SpO2 99%  Gen:   Awake, no distress   Resp:  Normal effort  MSK:   Moves extremities without difficulty  Other:  Scleral icterus  Medical Decision Making  Medically screening exam initiated at 5:37 PM.  Appropriate orders placed.  [name] [name] was informed that the remainder of the evaluation will be completed by another provider, this initial triage assessment does not replace that evaluation, and the importance of remaining in the ED until their evaluation is  complete.  Spoke with Dr. Darl Householder regarding this patient.  Labs ordered, will start with biliary ultrasound given new AKI   9:18 PM -I called lab to follow-up wondering why his labs were running.  They inform me they do not have them.  We are bringing him back to draw labs now.   Sherrill Raring, PA-C 09/15/22 [place], Byrne Capek, PA-C 09/15/22 2118

## 2022-09-15 NOTE — ED Notes (Signed)
Potassium 2.7 Lactic 2.3  [name], [place] informed.

## 2022-09-15 NOTE — ED Triage Notes (Signed)
Pt sent from UC due to abnormal labs. Pt has been having diarrhea for 2 weeks and lethargic. Intermittent left sided abd pain.

## 2022-09-16 ENCOUNTER — Other Ambulatory Visit: Payer: Self-pay

## 2022-09-16 DIAGNOSIS — F1010 Alcohol abuse, uncomplicated: Secondary | ICD-10-CM | POA: Diagnosis present

## 2022-09-16 DIAGNOSIS — E876 Hypokalemia: Secondary | ICD-10-CM

## 2022-09-16 DIAGNOSIS — I959 Hypotension, unspecified: Secondary | ICD-10-CM | POA: Diagnosis present

## 2022-09-16 DIAGNOSIS — N179 Acute kidney failure, unspecified: Secondary | ICD-10-CM | POA: Diagnosis present

## 2022-09-16 DIAGNOSIS — I110 Hypertensive heart disease with heart failure: Secondary | ICD-10-CM | POA: Diagnosis present

## 2022-09-16 DIAGNOSIS — I739 Peripheral vascular disease, unspecified: Secondary | ICD-10-CM | POA: Diagnosis present

## 2022-09-16 DIAGNOSIS — Z95810 Presence of automatic (implantable) cardiac defibrillator: Secondary | ICD-10-CM | POA: Diagnosis not present

## 2022-09-16 DIAGNOSIS — Z7984 Long term (current) use of oral hypoglycemic drugs: Secondary | ICD-10-CM | POA: Diagnosis not present

## 2022-09-16 DIAGNOSIS — E861 Hypovolemia: Secondary | ICD-10-CM | POA: Diagnosis present

## 2022-09-16 DIAGNOSIS — R933 Abnormal findings on diagnostic imaging of other parts of digestive tract: Secondary | ICD-10-CM | POA: Diagnosis present

## 2022-09-16 DIAGNOSIS — K831 Obstruction of bile duct: Secondary | ICD-10-CM | POA: Diagnosis present

## 2022-09-16 DIAGNOSIS — K838 Other specified diseases of biliary tract: Secondary | ICD-10-CM

## 2022-09-16 DIAGNOSIS — I2510 Atherosclerotic heart disease of native coronary artery without angina pectoris: Secondary | ICD-10-CM | POA: Diagnosis present

## 2022-09-16 DIAGNOSIS — E222 Syndrome of inappropriate secretion of antidiuretic hormone: Secondary | ICD-10-CM | POA: Diagnosis present

## 2022-09-16 DIAGNOSIS — D72829 Elevated white blood cell count, unspecified: Secondary | ICD-10-CM | POA: Diagnosis present

## 2022-09-16 DIAGNOSIS — Z823 Family history of stroke: Secondary | ICD-10-CM | POA: Diagnosis not present

## 2022-09-16 DIAGNOSIS — Z681 Body mass index (BMI) 19 or less, adult: Secondary | ICD-10-CM | POA: Diagnosis not present

## 2022-09-16 DIAGNOSIS — E860 Dehydration: Secondary | ICD-10-CM | POA: Diagnosis present

## 2022-09-16 DIAGNOSIS — I428 Other cardiomyopathies: Secondary | ICD-10-CM

## 2022-09-16 DIAGNOSIS — F17210 Nicotine dependence, cigarettes, uncomplicated: Secondary | ICD-10-CM | POA: Diagnosis present

## 2022-09-16 DIAGNOSIS — Z79899 Other long term (current) drug therapy: Secondary | ICD-10-CM | POA: Diagnosis not present

## 2022-09-16 DIAGNOSIS — R197 Diarrhea, unspecified: Secondary | ICD-10-CM | POA: Diagnosis present

## 2022-09-16 DIAGNOSIS — I5022 Chronic systolic (congestive) heart failure: Secondary | ICD-10-CM

## 2022-09-16 DIAGNOSIS — R634 Abnormal weight loss: Secondary | ICD-10-CM | POA: Diagnosis present

## 2022-09-16 DIAGNOSIS — I10 Essential (primary) hypertension: Secondary | ICD-10-CM

## 2022-09-16 DIAGNOSIS — C221 Intrahepatic bile duct carcinoma: Secondary | ICD-10-CM | POA: Diagnosis present

## 2022-09-16 LAB — COMPREHENSIVE METABOLIC PANEL
ALT: 75 U/L — ABNORMAL HIGH (ref 0–44)
AST: 128 U/L — ABNORMAL HIGH (ref 15–41)
Albumin: 2.1 g/dL — ABNORMAL LOW (ref 3.5–5.0)
Alkaline Phosphatase: 790 U/L — ABNORMAL HIGH (ref 38–126)
Anion gap: 14 (ref 5–15)
BUN: 16 mg/dL (ref 8–23)
CO2: 24 mmol/L (ref 22–32)
Calcium: 9.2 mg/dL (ref 8.9–10.3)
Chloride: 98 mmol/L (ref 98–111)
Creatinine, Ser: 1.74 mg/dL — ABNORMAL HIGH (ref 0.61–1.24)
GFR, Estimated: 42 mL/min — ABNORMAL LOW (ref >60)
Glucose, Bld: 132 mg/dL — ABNORMAL HIGH (ref 70–99)
Potassium: 2.7 mmol/L — CL (ref 3.5–5.1)
Sodium: 136 mmol/L (ref 135–145)
Total Bilirubin: 42 mg/dL (ref 0.3–1.2)
Total Protein: 6.6 g/dL (ref 6.5–8.1)

## 2022-09-16 LAB — BILIRUBIN, DIRECT
Bilirubin, Direct: 28 mg/dL — ABNORMAL HIGH (ref 0.0–0.2)
Bilirubin, Direct: 22.1 mg/dL — ABNORMAL HIGH (ref 0.0–0.2)

## 2022-09-16 LAB — PROTIME-INR
INR: 2.2 — ABNORMAL HIGH (ref 0.8–1.2)
Prothrombin Time: 24.2 seconds — ABNORMAL HIGH (ref 11.4–15.2)

## 2022-09-16 LAB — BASIC METABOLIC PANEL
Anion gap: 8 (ref 5–15)
BUN: 13 mg/dL (ref 8–23)
CO2: 25 mmol/L (ref 22–32)
Calcium: 9 mg/dL (ref 8.9–10.3)
Chloride: 103 mmol/L (ref 98–111)
Creatinine, Ser: 1.25 mg/dL — ABNORMAL HIGH (ref 0.61–1.24)
GFR, Estimated: >60 mL/min (ref >60)
Glucose, Bld: 103 mg/dL — ABNORMAL HIGH (ref 70–99)
Potassium: 3.1 mmol/L — ABNORMAL LOW (ref 3.5–5.1)
Sodium: 136 mmol/L (ref 135–145)

## 2022-09-16 LAB — MAGNESIUM: Magnesium: 2.2 mg/dL (ref 1.7–2.4)

## 2022-09-16 LAB — C DIFFICILE QUICK SCREEN W PCR REFLEX
C Diff antigen: NEGATIVE
C Diff interpretation: NOT DETECTED
C Diff toxin: NEGATIVE

## 2022-09-16 LAB — DIGOXIN LEVEL
Digoxin Level: <0.2 ng/mL — ABNORMAL LOW (ref 0.8–2.0)
Digoxin Level: <0.2 ng/mL — ABNORMAL LOW (ref 0.8–2.0)

## 2022-09-16 LAB — BRAIN NATRIURETIC PEPTIDE: B Natriuretic Peptide: 215.3 pg/mL — ABNORMAL HIGH (ref 0.0–100.0)

## 2022-09-16 LAB — APTT: aPTT: 39 seconds — ABNORMAL HIGH (ref 24–36)

## 2022-09-16 LAB — AMMONIA: Ammonia: 55 umol/L — ABNORMAL HIGH (ref 9–35)

## 2022-09-16 LAB — LACTATE DEHYDROGENASE
LDH: 164 U/L (ref 98–192)
LDH: 147 U/L (ref 98–192)

## 2022-09-16 LAB — HIV ANTIBODY (ROUTINE TESTING W REFLEX): HIV Screen 4th Generation wRfx: NONREACTIVE

## 2022-09-16 LAB — LACTIC ACID, PLASMA: Lactic Acid, Venous: 2 mmol/L (ref 0.5–1.9)

## 2022-09-16 LAB — LIPASE, BLOOD
Lipase: 27 U/L (ref 11–51)
Lipase: 31 U/L (ref 11–51)

## 2022-09-16 MED ORDER — ISOSORBIDE MONONITRATE ER 30 MG PO TB24
15.0000 mg | ORAL_TABLET | Freq: Every day | ORAL | Status: DC
  Administered 2022-09-19: 15 mg via ORAL
  Filled 2022-09-16 (×2): qty 1

## 2022-09-16 MED ORDER — LACTULOSE 10 GM/15ML PO SOLN
20.0000 g | Freq: Two times a day (BID) | ORAL | Status: AC
  Administered 2022-09-16 – 2022-09-17 (×3): 20 g via ORAL
  Filled 2022-09-16 (×3): qty 30

## 2022-09-16 MED ORDER — POTASSIUM CHLORIDE 2 MEQ/ML IV SOLN
INTRAVENOUS | Status: AC
  Filled 2022-09-16: qty 1000

## 2022-09-16 MED ORDER — POTASSIUM CHLORIDE 20 MEQ PO PACK
40.0000 meq | PACK | Freq: Two times a day (BID) | ORAL | Status: DC
  Administered 2022-09-16: 40 meq via ORAL
  Filled 2022-09-16: qty 2

## 2022-09-16 MED ORDER — DIGOXIN 125 MCG PO TABS
0.1250 mg | ORAL_TABLET | Freq: Every day | ORAL | Status: DC
  Administered 2022-09-17 – 2022-09-19 (×2): 0.125 mg via ORAL
  Filled 2022-09-16 (×3): qty 1

## 2022-09-16 MED ORDER — LACTATED RINGERS IV SOLN: INTRAVENOUS | Status: DC

## 2022-09-16 MED ORDER — POTASSIUM CHLORIDE CRYS ER 20 MEQ PO TBCR
40.0000 meq | EXTENDED_RELEASE_TABLET | Freq: Two times a day (BID) | ORAL | Status: DC
  Filled 2022-09-16: qty 2

## 2022-09-16 MED ORDER — POTASSIUM CHLORIDE 10 MEQ/100ML IV SOLN
10.0000 meq | INTRAVENOUS | Status: AC
  Administered 2022-09-16 (×3): 10 meq via INTRAVENOUS
  Filled 2022-09-16 (×3): qty 100

## 2022-09-16 MED ORDER — DIGOXIN 125 MCG PO TABS: 0.1250 mg | ORAL_TABLET | Freq: Every day | ORAL | Status: DC

## 2022-09-16 MED ORDER — CARVEDILOL 3.125 MG PO TABS
3.1250 mg | ORAL_TABLET | Freq: Two times a day (BID) | ORAL | Status: DC
  Administered 2022-09-17 – 2022-09-19 (×3): 3.125 mg via ORAL
  Filled 2022-09-16 (×4): qty 1

## 2022-09-16 MED ORDER — PANTOPRAZOLE SODIUM 40 MG PO TBEC
40.0000 mg | DELAYED_RELEASE_TABLET | Freq: Every day | ORAL | Status: DC
  Administered 2022-09-16 – 2022-09-19 (×4): 40 mg via ORAL
  Filled 2022-09-16 (×5): qty 1

## 2022-09-16 MED ORDER — HEPARIN SODIUM (PORCINE) 5000 UNIT/ML IJ SOLN: 5000.0000 [IU] | Freq: Three times a day (TID) | INTRAMUSCULAR | Status: DC

## 2022-09-16 MED ORDER — MELATONIN 5 MG PO TABS
10.0000 mg | ORAL_TABLET | Freq: Every evening | ORAL | Status: DC | PRN
  Administered 2022-09-17: 10 mg via ORAL
  Filled 2022-09-16: qty 2

## 2022-09-16 MED ORDER — MAGNESIUM SULFATE 2 GM/50ML IV SOLN
2.0000 g | Freq: Once | INTRAVENOUS | Status: AC
  Administered 2022-09-16: 2 g via INTRAVENOUS
  Filled 2022-09-16: qty 50

## 2022-09-16 MED ORDER — ONDANSETRON HCL 4 MG PO TABS: 4.0000 mg | ORAL_TABLET | Freq: Four times a day (QID) | ORAL | Status: DC | PRN

## 2022-09-16 MED ORDER — ONDANSETRON HCL 4 MG/2ML IJ SOLN: 4.0000 mg | Freq: Four times a day (QID) | INTRAMUSCULAR | Status: DC | PRN

## 2022-09-16 MED ORDER — HEPARIN SODIUM (PORCINE) 5000 UNIT/ML IJ SOLN
5000.0000 [IU] | Freq: Three times a day (TID) | INTRAMUSCULAR | Status: AC
  Administered 2022-09-16 (×2): 5000 [IU] via SUBCUTANEOUS
  Filled 2022-09-16: qty 1

## 2022-09-16 NOTE — Assessment & Plan Note (Signed)
Replete with IV KCL. Hold diuretics.

## 2022-09-16 NOTE — Assessment & Plan Note (Signed)
Appears volume depleted. Continue with gentle IVF.

## 2022-09-16 NOTE — H&P (View-Only) (Signed)
Attending physician's note   I have taken a history, reviewed the chart, and examined the patient. I performed a substantive portion of this encounter, including complete performance of at least one of the key components, in conjunction with the APP. I agree with the APP's note, impression, and recommendations with my edits.   66 year old male with medical history as found below, to clued history of CHF with EF 25%, nonischemic cardiomyopathy s/p ICD, presenting with painless jaundice and diarrhea.  Has been having diarrhea over the last couple of weeks with 4-5 loose, nonbloody stools daily.  Does have a history of heavy EtOH use, but stopped drinking over the last couple weeks due to not feeling well.  Admission evaluation notable for the following: - WBC 18.7, H/H 14/40, PLT 377 - AST/ALT 128/75, ALP 790, T. bili 42 - BUNs/creatinine 16/1.7 (baseline 11/0.97 in 01/2022) - INR 2.2 - Lactate 2.3 --> 2.0 - Ammonia 55 - BNP 215 - Ultrasound: Dilated intra/extrahepatic ducts with CBD 1.5 cm, distended GB with sludge but no cholecystitis - CT: Dilated intra/extrahepatic ducts with CBD 14 mm, distended GB.  Area of thickening of the sigmoid   1) Jaundice 2) Elevated liver enzymes 3) Dilated intrahepatic/extrahepatic ducts - Unable to undergo MRI/MRCP due to noncompatible ICD - Plan for ERCP tomorrow with Dr. Lyndel Safe for further evaluation and decompressive stent placement - Clears okay today with n.p.o. midnight - Continue trending liver enzymes  4) Diarrhea 5) Leukocytosis - Check GI PCR panel including C. difficile  6) CHF - Management per Cardiology service and primary Hospitalist service - Hold ASA for now  The indications, risks, and benefits of ERCP were explained to the patient in detail. Risks include but are not limited to bleeding, perforation, adverse reaction to medications, pancreatitis, inability to cannulate duct, and cardiopulmonary compromise. Sequelae include but  are not limited to the possibility of surgery, prolonged hospitalization, and mortality. The patient verbalized understanding and wishes to proceed. All questions answered to the best of my ability.    [address], DO, [place] [phone] office           Consultation  Referring Provider:   Dr. Candiss Norse Primary Care Physician:  Pcp, No Primary Gastroenterologist: Althia Forts       Reason for Consultation: Hyperbilirubinemia             HPI:   [name] [name] is a 66 y.o. male with a past medical history of nonischemic cardiomyopathy status post ICD placement, chronic systolic heart failure with an EF of 25%, hypertension and others listed below, who presented to the ER from the urgent care on 09/15/2022 for abnormal labs showing hyperbilirubinemia.    Apparently patient seen in urgent care on 12/19 due to 2 weeks of diarrhea, had labs drawn and revealed a sodium of 134, potassium 2.9, creatinine 1.65 and a bilirubin of 4.2 with alk phos of 86.    At time of presentation to the ER yesterday describe 2 weeks of diarrhea with very little appetite and weight loss but not sure how much.    Today, the patient tells me that over the past couple of weeks he has had diarrhea at least 4-5 times a day which has kept him from leaving the house.  Tells me he thinks everything started to go downhill after he went to the dentist to get dentures, but denies being on any new meds or antibiotics around that time.  Does have occasional left lower quadrant abdominal pain which seems  off-and-on, sometimes better after bowel movement and has had decreased appetite over the past couple of weeks.  Does describe prior alcohol abuse with at least 3 to 4 glasses of wine a night over the past year, but stopped drinking at all about 3 to 4 weeks ago because he just started feeling bad.  He did try Pepto-Bismol at home which helped a little bit.  Apparently has also been off of all of his other chronic medications since a  dental procedure a couple weeks ago.  No nausea or vomiting.    Denies fever or chills.  ER course: Sodium 136, potassium 2.7, total bili 42, alk phos 790, AST 128, ALT 75, LDH 164, WBC 18.7, hemoglobin 13.9; right upper quadrant ultrasound with sludge in the lumen of the gallbladder, no demonstratable gallbladder stones, dilation of the intrahepatic and proximal extrahepatic ducts, proximal common bile duct measured 1.5 cm in diameter; CT scan without contrast showed 1.4 cm at the distal common bile duct with abnormal wall thickening of the sigmoid colon; could not get MRCP due to defibrillator  GI History: No known prior GI history  Past Medical History:  Diagnosis Date   AICD (automatic cardioverter/defibrillator) present    Chronic systolic CHF (congestive heart failure) ([place]) 01/31/2015   Coronary artery disease    Hypertension    Nonischemic cardiomyopathy ([place]) 06/30/2015    Past Surgical History:  Procedure Laterality Date   CARDIAC CATHETERIZATION N/A 02/04/2015   Procedure: Right/Left Heart Cath and Coronary Angiography;  Surgeon: Larey Dresser, MD;  Location: [facility];  Service: Cardiovascular;  Laterality: N/A;   CARDIAC DEFIBRILLATOR PLACEMENT  07/09/2015   CHALAZION EXCISION Left 01/16/2014   Procedure: MINOR EXCISION OF CHALAZION;  Surgeon: Myrtha Mantis., MD;  Location: [facility];  Service: Ophthalmology;  Laterality: Left;   EP IMPLANTABLE DEVICE N/A 07/09/2015   Procedure: ICD Implant;  Surgeon: Deboraha Sprang, MD;  Location: [facility];  Service: Cardiovascular;  Laterality: N/A;   EYE SURGERY Left ~ 2013   "eye infection"    Family History  Problem Relation Age of Onset   Stroke Mother    Heart Problems Other     Social History   Tobacco Use   Smoking status: Every Day    Packs/day: 1.00    Years: 40.00    Total pack years: 40.00    Types: Cigarettes   Smokeless tobacco: Never  Substance Use Topics   Alcohol  use: Not Currently    Comment: 07/09/2015 "I haven't had a drink since last of 12/2014"   Drug use: Not Currently    Types: "Crack" cocaine, Marijuana    Comment: 07/09/2015 "I did drugs a long time;ago  quit in the mid 1980's"    Prior to Admission medications   Medication Sig Start Date End Date Taking? Authorizing Provider  aspirin 81 MG EC tablet TAKE 1 TABLET(81 MG) BY MOUTH DAILY. SWALLOW WHOLE Patient taking differently: Take 81 mg by mouth daily. TAKE 1 TABLET(81 MG) BY MOUTH DAILY. SWALLOW WHOLE 11/04/21  Yes Larey Dresser, MD  carvedilol (COREG) 25 MG tablet Take 1 tablet (25 mg total) by mouth 2 (two) times daily with a meal. 11/04/21  Yes Larey Dresser, MD  dapagliflozin propanediol (FARXIGA) 10 MG TABS tablet Take 1 tablet (10 mg total) by mouth daily before breakfast. 11/04/21  Yes Larey Dresser, MD  digoxin (LANOXIN) 0.125 MG tablet Take 1 tablet (0.125 mg total) by  mouth daily. 11/04/21  Yes Larey Dresser, MD  isosorbide-hydrALAZINE (BIDIL) 20-37.5 MG tablet Take 2 tablets by mouth 3 (three) times daily. Patient taking differently: Take 1 tablet by mouth in the morning and at bedtime. 11/04/21  Yes Larey Dresser, MD  ezetimibe (ZETIA) 10 MG tablet TAKE 1 TABLET(10 MG) BY MOUTH DAILY 11/04/21   Larey Dresser, MD  metFORMIN (GLUCOPHAGE XR) 500 MG 24 hr tablet Take 1 tablet (500 mg total) by mouth daily with breakfast. 12/20/17   Clent Demark, PA-C  rosuvastatin (CRESTOR) 40 MG tablet TAKE 1 TABLET(40 MG) BY MOUTH DAILY 11/04/21   Larey Dresser, MD  sacubitril-valsartan (ENTRESTO) 97-103 MG Take 1 tablet by mouth 2 (two) times daily. 11/04/21   Larey Dresser, MD  spironolactone (ALDACTONE) 50 MG tablet Take 1 tablet (50 mg total) by mouth daily. 02/16/22   Larey Dresser, MD  triamcinolone ointment (KENALOG) 0.5 % Apply 1 application topically 2 (two) times daily. 07/11/18   Clent Demark, PA-C    Current Facility-Administered Medications  Medication Dose Route  Frequency Provider Last Rate Last Admin   [START ON 09/17/2022] carvedilol (COREG) tablet 3.125 mg  3.125 mg Oral BID WC Thurnell Lose, MD       [START ON 09/17/2022] digoxin (LANOXIN) tablet 0.125 mg  0.125 mg Oral Daily Thurnell Lose, MD       heparin injection 5,000 Units  5,000 Units Subcutaneous Q8H Thurnell Lose, MD       [START ON 09/17/2022] isosorbide mononitrate (IMDUR) 24 hr tablet 15 mg  15 mg Oral Daily Thurnell Lose, MD       lactated ringers 1,000 mL with potassium chloride 60 mEq infusion   Intravenous Continuous Thurnell Lose, MD 100 mL/hr at 09/16/22 0653 New Bag at 09/16/22 0653   lactulose (CHRONULAC) 10 GM/15ML solution 20 g  20 g Oral BID Thurnell Lose, MD       melatonin tablet 10 mg  10 mg Oral QHS PRN Kristopher Oppenheim, DO       ondansetron [facility]) tablet 4 mg  4 mg Oral Q6H PRN Kristopher Oppenheim, DO       Or   ondansetron [facility]) injection 4 mg  4 mg Intravenous Q6H PRN Kristopher Oppenheim, DO       pantoprazole (PROTONIX) EC tablet 40 mg  40 mg Oral Daily Thurnell Lose, MD       potassium chloride (KLOR-CON) packet 40 mEq  40 mEq Oral BID Thurnell Lose, MD       Current Outpatient Medications  Medication Sig Dispense Refill   aspirin 81 MG EC tablet TAKE 1 TABLET(81 MG) BY MOUTH DAILY. SWALLOW WHOLE (Patient taking differently: Take 81 mg by mouth daily. TAKE 1 TABLET(81 MG) BY MOUTH DAILY. SWALLOW WHOLE) 30 tablet 11   carvedilol (COREG) 25 MG tablet Take 1 tablet (25 mg total) by mouth 2 (two) times daily with a meal. 60 tablet 6   dapagliflozin propanediol (FARXIGA) 10 MG TABS tablet Take 1 tablet (10 mg total) by mouth daily before breakfast. 30 tablet 11   digoxin (LANOXIN) 0.125 MG tablet Take 1 tablet (0.125 mg total) by mouth daily. 90 tablet 3   isosorbide-hydrALAZINE (BIDIL) 20-37.5 MG tablet Take 2 tablets by mouth 3 (three) times daily. (Patient taking differently: Take 1 tablet by mouth in the morning and at bedtime.) 180 tablet 11   ezetimibe  (ZETIA) 10 MG tablet TAKE 1 TABLET(10 MG)  BY MOUTH DAILY 90 tablet 3   metFORMIN (GLUCOPHAGE XR) 500 MG 24 hr tablet Take 1 tablet (500 mg total) by mouth daily with breakfast. 90 tablet 3   rosuvastatin (CRESTOR) 40 MG tablet TAKE 1 TABLET(40 MG) BY MOUTH DAILY 90 tablet 3   sacubitril-valsartan (ENTRESTO) 97-103 MG Take 1 tablet by mouth 2 (two) times daily. 60 tablet 6   spironolactone (ALDACTONE) 50 MG tablet Take 1 tablet (50 mg total) by mouth daily. 90 tablet 3   triamcinolone ointment (KENALOG) 0.5 % Apply 1 application topically 2 (two) times daily. 15 g 0    Allergies as of 09/15/2022   (No Known Allergies)     Review of Systems:    Constitutional: No weight loss, fever or chills Skin: No rash Cardiovascular: No chest pain Respiratory: No SOB  Gastrointestinal: See HPI and otherwise negative Genitourinary: No dysuria  Neurological: No headache, dizziness or syncope Musculoskeletal: No new muscle or joint pain Hematologic: No bleeding  Psychiatric: No history of depression or anxiety    Physical Exam:  Vital signs in last 24 hours: Temp:  [97.8 F (36.6 C)-99.3 F (37.4 C)] 98.9 F (37.2 C) (12/21 0830) Pulse Rate:  [53-102] 86 (12/21 0805) Resp:  [11-25] 17 (12/21 0805) BP: (79-153)/(61-97) 110/72 (12/21 0805) SpO2:  [99 %-100 %] 99 % (12/21 0805)   General:   Pleasant cachectic and ill appearing AA male appears to be in NAD, Well developed, Well nourished, alert and cooperative Head:  Normocephalic and atraumatic. Eyes:   PEERL, EOMI. +icterus Conjunctiva pink. Ears:  Normal auditory acuity. Neck:  Supple Throat: Oral cavity and pharynx without inflammation, swelling or lesion. +Edentulous on the top Lungs: Respirations even and unlabored. Lungs clear to auscultation bilaterally.   No wheezes, crackles, or rhonchi.  Heart: Normal S1, S2. No MRG. Regular rate and rhythm. No peripheral edema, cyanosis or pallor.  Abdomen:  +hardened (somewhat limited by patients  positioning), nondistended, nontender. No rebound or guarding. Normal bowel sounds. No appreciable masses or hepatomegaly. Rectal:  Not performed.  Msk:  Symmetrical without gross deformities. Peripheral pulses intact.  Extremities:  Without edema, no deformity or joint abnormality.  Neurologic:  Alert and  oriented x4;  grossly normal neurologically.  Skin:   Dry and intact without significant lesions or rashes. Psychiatric: Demonstrates good judgement and reason without abnormal affect or behaviors.   LAB RESULTS: Recent Labs    09/14/22 1915 09/15/22 2136 09/15/22 2208  WBC 16.7* 18.7*  --   HGB 14.6 13.9 15.6  HCT 40.3 40.4 46.0  PLT 421* 377  --    BMET Recent Labs    09/14/22 1915 09/15/22 2136 09/15/22 2208  NA 134* 136 135  K 2.9* 2.7* 2.8*  CL 94* 98 99  CO2 22 24  --   GLUCOSE 123* 132* 138*  BUN _0 CREATININE 1.65* 1.74* 1.70*  CALCIUM 9.4 9.2  --    LFT Recent Labs    09/15/22 2136  PROT 6.6  ALBUMIN 2.1*  AST 128*  ALT 75*  ALKPHOS 790*  BILITOT 42.0*  BILIDIR 28.0*   PT/INR Recent Labs    09/16/22 0700  LABPROT 24.2*  INR 2.2*    STUDIES: CT ABDOMEN PELVIS WO CONTRAST  Result Date: 09/15/2022 CLINICAL DATA:  Left-sided abdominal pain, diarrhea EXAM: CT ABDOMEN AND PELVIS WITHOUT CONTRAST TECHNIQUE: Multidetector CT imaging of the abdomen and pelvis was performed following the standard protocol without IV contrast. RADIATION DOSE REDUCTION: This exam  was performed according to the departmental dose-optimization program which includes automated exposure control, adjustment of the mA and/or kV according to patient size and/or use of iterative reconstruction technique. COMPARISON:  Abdomen sonogram done earlier today, CT abdomen done on 11/27/2004. FINDINGS: Lower chest: Pacer leads are noted in place. Visualized lower lung fields are clear. Hepatobiliary: There is abnormal dilation of intrahepatic and extrahepatic bile ducts. Distal common  bile duct in the head of the pancreas measures 14 mm. Gallbladder is distended. There is no wall thickening in gallbladder. There is no fluid around the gallbladder. Pancreas: No focal abnormalities are seen in pancreas. Evaluation is limited in this noncontrast study. Spleen: Unremarkable. Adrenals/Urinary Tract: Adrenals are unremarkable. There is no hydronephrosis. Small calcifications are seen in left renal artery branches. There are no demonstrable renal or ureteral stones. Urinary bladder is not distended. Stomach/Bowel: Stomach is not distended. Small bowel loops are not dilated. Appendix is not distinctly seen. There is no pericecal inflammation. There is abnormal wall thickening in sigmoid colon measuring up to 2.4 cm in thickness. Vascular/Lymphatic: There are calcifications in abdominal aorta and its major branches. There is aneurysmal dilation of infrarenal aorta measuring 4.6 x 4.1 cm. Evaluation of lumen is limited in this noncontrast study. There is aneurysmal dilation of right common iliac artery measuring 2.4 cm. There is aneurysmal dilation in left common iliac artery measuring 2.3 cm. There is no demonstrable retroperitoneal hematoma. No significant lymphadenopathy is seen. Reproductive: Prostate is enlarged. Other: There is no ascites or pneumoperitoneum. Umbilical hernia containing fat is seen. Musculoskeletal: Degenerative changes are noted in lumbar spine with disc space narrowing, bony spurs, facet hypertrophy and encroachment of neural foramina at multiple levels, more so at L4-L5 and L5-S1 levels. IMPRESSION: There is no evidence of intestinal obstruction or pneumoperitoneum. There is no hydronephrosis. There is abnormal dilation of intrahepatic and extrahepatic bile ducts. Distal common bile duct measures 1.4 cm. Evaluation of pancreas in the region of ampulla is limited in this noncontrast study. Findings may be related to noncalcified calculus in the distal common bile duct or stricture  in the distal common bile duct. Follow-up MRI and MRCP may be considered. Distention of gallbladder may be due to stricture in distal common bile duct and possibly fasting state of the patient. There is abnormal wall thickening in sigmoid colon. Possibility of malignant neoplastic process in sigmoid colon is not excluded. Endoscopic correlation should be considered. There is aneurysmal dilation of infrarenal abdominal aorta measuring 4.6 cm in diameter. There is aneurysmal dilation of both common iliac arteries measuring up to 2.4 cm. Recommend follow-up every 6 months and vascular consultation.Reference: J Am Coll Radiol 0960;[id number]. Enlarged prostate.  Lumbar spondylosis. Other findings as described in the body of the report. Electronically Signed   By: Elmer Picker M.D.   On: 09/15/2022 21:45   US Abdomen Limited RUQ (LIVER/GB)  Result Date: 09/15/2022 CLINICAL DATA:  Pain right upper quadrant EXAM: ULTRASOUND ABDOMEN LIMITED RIGHT UPPER QUADRANT COMPARISON:  None Available. FINDINGS: Gallbladder: There are no demonstrable gallbladder stones. Technologist did not observe any tenderness over the gallbladder. There are low-level echoes in the lumen suggesting possible sludge. There is no fluid around the gallbladder. Common bile duct: Diameter: Common bile duct measures 1.5 cm. There is dilation of intrahepatic bile ducts. Distal common bile duct is obscured by bowel gas. Liver: There is increased echogenicity in liver. No focal abnormalities are seen in the visualized portions of liver. Portal vein is patent on color Doppler imaging with  normal direction of blood flow towards the liver. Other: None. IMPRESSION: Sludge is seen in the lumen of gallbladder. There are no demonstrable gallbladder stones or imaging signs of acute cholecystitis. There is dilation of intrahepatic and visualized proximal extrahepatic bile ducts. Proximal common bile duct measures 1.5 cm in diameter. This may suggest  stricture or calculus in the distal common bile duct. Distal common bile duct is obscured by bowel gas and not evaluated. If clinically warranted, follow-up CT may be considered. Electronically Signed   By: Elmer Picker M.D.   On: 09/15/2022 18:28     Impression / Plan:   Impression: 1.  Elevated LFTs: With common bile duct dilation as well as thickening of the sigmoid colon on imaging, LFTs as above with a bilirubin of 42; high likelihood of malignancy versus other bile duct obstruction 2.  CHF: Ejection fraction 25% at last check 02/16/2022 3.  ICD in place 4.  Hypokalemia: Currently 2.8 5.  Hypertension 6.  Diarrhea: For the past 2 weeks, "uncontrollable" per the patient describes 4-5 loose urgent bowel movements a day that kept him from leaving the house, no real abdominal pain; consider infectious versus inflammatory versus relation to hyperbilirubinemia versus other  Plan: 1.  Continue correction of Potassium so that patient can have ERCP tomorrow.  Appreciate help of the hospitalist team. 2.  Scheduled patient for ERCP tomorrow afternoon at 2:00 with Dr. Lyndel Safe.  Did discuss risks, benefits, limitations and alternatives and the patient agrees to proceed. 3.  Heparin will need to be held starting tomorrow 12/22 4.  Continue to monitor LFTs 5.  Continue Pantoprazole 6.  Patient described diarrhea, will add on stool studies 7.  Patient will be n.p.o. at midnight, can have clear or soft diet today as tolerated  Thank you for your kind consultation, we will continue to follow.  Lavone Nian Lemmon  09/16/2022, 9:00 AM

## 2022-09-16 NOTE — Subjective & Objective (Addendum)
CC: abnormal labs HPI: 66 year old African-American male history of nonischemic cardiomyopathy status post ICD placement, chronic systolic heart failure with an EF of 25%, chronic tobacco abuse, hypertension who presents to the ER from urgent care due to abnormal labs.  He was seen in urgent care on the 19th due to 2 weeks of diarrhea.  He had lab was drawn in urgent care.  It revealed a sodium of 134, potassium 2.9, creatinine 1.65, bilirubin of 42, alk phos of 86.  Patient states that he has had 2 weeks of diarrhea.  He has had very little appetite.  He states he has had weight loss but cannot quantify how much.  Due to his dark complexion, he has not noticed any jaundice.  On arrival temp 90.2 heart 53 blood pressure 110/74.  Labs showed a sodium 136, potassium 2.7, total bili of 42, alk phos of 790, AST 128, ALT of 75  LDH of 164  White count 18.7, hemoglobin 13.9    Right upper quadrant ultrasound demonstrated some sludge in the lumen of the gallbladder.  There is no demonstrable gallbladder stones.  There is dilatation of the intrahepatic and proximal extrahepatic ducts.  Proximal common bile duct measured 1.5 cm in diameter.  CT scan without contrast demonstrated 1.4 cm at the distal common bile duct.  There is also abnormal wall thickening of the sigmoid colon.  EDP has contacted [facility]  Due to the patient's defibrillator, he cannot get an MRCP at night.  He needs a technician to be able to evaluate his ICD during the daytime.  [facility] hospitalist contacted for admission.

## 2022-09-16 NOTE — Assessment & Plan Note (Signed)
Admit to telemetry bed. Concern for neoplastic process. Will need MRCP and GI consult.

## 2022-09-16 NOTE — Assessment & Plan Note (Signed)
Chronic. Hold GDMT due to hypovolemia.

## 2022-09-16 NOTE — Assessment & Plan Note (Signed)
Hold HTN meds for now due to low BP.

## 2022-09-16 NOTE — H&P (Addendum)
History and Physical    [name] [name] OMB:[id number] DOB: December 18, 1955 DOA: 09/15/2022  DOS: the patient was seen and examined on 09/15/2022  PCP: Pcp, No   Patient coming from: Home  I have personally briefly reviewed patient's old medical records in [place]  CC: abnormal labs HPI: 66 year old African-American male history of nonischemic cardiomyopathy status post ICD placement, chronic systolic heart failure with an EF of 25%, chronic tobacco abuse, hypertension who presents to the ER from urgent care due to abnormal labs.  He was seen in urgent care on the 19th due to 2 weeks of diarrhea.  He had lab was drawn in urgent care.  It revealed a sodium of 134, potassium 2.9, creatinine 1.65, bilirubin of 42, alk phos of 86.  Patient states that he has had 2 weeks of diarrhea.  He has had very little appetite.  He states he has had weight loss but cannot quantify how much.  Due to his dark complexion, he has not noticed any jaundice.  On arrival temp 90.2 heart 53 blood pressure 110/74.  Labs showed a sodium 136, potassium 2.7, total bili of 42, alk phos of 790, AST 128, ALT of 75  LDH of 164  White count 18.7, hemoglobin 13.9    Right upper quadrant ultrasound demonstrated some sludge in the lumen of the gallbladder.  There is no demonstrable gallbladder stones.  There is dilatation of the intrahepatic and proximal extrahepatic ducts.  Proximal common bile duct measured 1.5 cm in diameter.  CT scan without contrast demonstrated 1.4 cm at the distal common bile duct.  There is also abnormal wall thickening of the sigmoid colon.  EDP has contacted  GI  Due to the patient's defibrillator, he cannot get an MRCP at night.  He needs a technician to be able to evaluate his ICD during the daytime.  [facility] contacted for admission.   ED Course: CT abd shows 1.4 cm CBG. Total bili 42.  Review of Systems:  Review of Systems  Constitutional:  Positive for  malaise/fatigue and weight loss.  HENT: Negative.    Eyes: Negative.   Respiratory: Negative.    Cardiovascular: Negative.   Gastrointestinal:  Positive for abdominal pain and diarrhea.  Genitourinary: Negative.   Musculoskeletal: Negative.   Skin: Negative.   Neurological:  Positive for weakness.  Endo/Heme/Allergies: Negative.   Psychiatric/Behavioral: Negative.    All other systems reviewed and are negative.   Past Medical History:  Diagnosis Date   AICD (automatic cardioverter/defibrillator) present    Chronic systolic CHF (congestive heart failure) ([place]) 01/31/2015   Coronary artery disease    Hypertension    Nonischemic cardiomyopathy ([place]) 06/30/2015    Past Surgical History:  Procedure Laterality Date   CARDIAC CATHETERIZATION N/A 02/04/2015   Procedure: Right/Left Heart Cath and Coronary Angiography;  Surgeon: Larey Dresser, MD;  Location: [facility];  Service: Cardiovascular;  Laterality: N/A;   CARDIAC DEFIBRILLATOR PLACEMENT  07/09/2015   CHALAZION EXCISION Left 01/16/2014   Procedure: MINOR EXCISION OF CHALAZION;  Surgeon: Myrtha Mantis., MD;  Location: [facility];  Service: Ophthalmology;  Laterality: Left;   EP IMPLANTABLE DEVICE N/A 07/09/2015   Procedure: ICD Implant;  Surgeon: Deboraha Sprang, MD;  Location: [facility];  Service: Cardiovascular;  Laterality: N/A;   EYE SURGERY Left ~ 2013   "eye infection"     reports that he has been smoking cigarettes. He has a 40.00 pack-year smoking history. He  has never used smokeless tobacco. He reports that he does not currently use alcohol. He reports that he does not currently use drugs after having used the following drugs: "Crack" cocaine and Marijuana.  No Known Allergies  Family History  Problem Relation Age of Onset   Stroke Mother    Heart Problems Other     Prior to Admission medications   Medication Sig Start Date End Date Taking? Authorizing Provider  aspirin 81  MG EC tablet TAKE 1 TABLET(81 MG) BY MOUTH DAILY. SWALLOW WHOLE Patient taking differently: Take 81 mg by mouth daily. TAKE 1 TABLET(81 MG) BY MOUTH DAILY. SWALLOW WHOLE 11/04/21   Larey Dresser, MD  carvedilol (COREG) 25 MG tablet Take 1 tablet (25 mg total) by mouth 2 (two) times daily with a meal. 11/04/21   Larey Dresser, MD  dapagliflozin propanediol (FARXIGA) 10 MG TABS tablet Take 1 tablet (10 mg total) by mouth daily before breakfast. 11/04/21   Larey Dresser, MD  digoxin (LANOXIN) 0.125 MG tablet Take 1 tablet (0.125 mg total) by mouth daily. 11/04/21   Larey Dresser, MD  ezetimibe (ZETIA) 10 MG tablet TAKE 1 TABLET(10 MG) BY MOUTH DAILY 11/04/21   Larey Dresser, MD  isosorbide-hydrALAZINE (BIDIL) 20-37.5 MG tablet Take 2 tablets by mouth 3 (three) times daily. 11/04/21   Larey Dresser, MD  metFORMIN (GLUCOPHAGE XR) 500 MG 24 hr tablet Take 1 tablet (500 mg total) by mouth daily with breakfast. 12/20/17   Clent Demark, PA-C  rosuvastatin (CRESTOR) 40 MG tablet TAKE 1 TABLET(40 MG) BY MOUTH DAILY 11/04/21   Larey Dresser, MD  sacubitril-valsartan (ENTRESTO) 97-103 MG Take 1 tablet by mouth 2 (two) times daily. 11/04/21   Larey Dresser, MD  spironolactone (ALDACTONE) 50 MG tablet Take 1 tablet (50 mg total) by mouth daily. 02/16/22   Larey Dresser, MD  triamcinolone ointment (KENALOG) 0.5 % Apply 1 application topically 2 (two) times daily. 07/11/18   Clent Demark, PA-C    Physical Exam: Vitals:   09/16/22 0030 09/16/22 0045 09/16/22 0117 09/16/22 0125  BP: (!) 107/96 111/76 (!) 79/71 93/65  Pulse:   84   Resp: (!) _0 Temp:   98.9 F (37.2 C)   TempSrc:   Oral   SpO2:   99%     Physical Exam Vitals and nursing note reviewed.  Constitutional:      General: He is not in acute distress.    Comments: Thin and cachetic appearing  HENT:     Head: Normocephalic and atraumatic.  Eyes:     General: Scleral icterus present.  Cardiovascular:     Rate and  Rhythm: Normal rate and regular rhythm.     Pulses: Normal pulses.  Pulmonary:     Effort: Pulmonary effort is normal.     Breath sounds: Normal breath sounds.  Abdominal:     General: Abdomen is flat. Bowel sounds are normal.     Tenderness: There is no guarding or rebound.    Musculoskeletal:     Right lower leg: No edema.     Left lower leg: No edema.  Skin:    General: Skin is warm and dry.     Capillary Refill: Capillary refill takes less than 2 seconds.  Neurological:     General: No focal deficit present.     Mental Status: He is oriented to person, place, and time.      Labs on  Admission: I have personally reviewed following labs and imaging studies  CBC: Recent Labs  Lab 09/14/22 1915 09/15/22 2136 09/15/22 2208  WBC 16.7* 18.7*  --   NEUTROABS 14.4* 16.2*  --   HGB 14.6 13.9 15.6  HCT 40.3 40.4 46.0  MCV 92.6 95.7  --   PLT 421* 377  --    Basic Metabolic Panel: Recent Labs  Lab 09/14/22 1915 09/15/22 2136 09/15/22 2208  NA 134* 136 135  K 2.9* 2.7* 2.8*  CL 94* 98 99  CO2 22 24  --   GLUCOSE 123* 132* 138*  BUN _0 CREATININE 1.65* 1.74* 1.70*  CALCIUM 9.4 9.2  --    GFR: CrCl cannot be calculated (Unknown ideal weight.). Liver Function Tests: Recent Labs  Lab 09/14/22 1915 09/15/22 2136  AST 133* 128*  ALT 77* 75*  ALKPHOS 861* 790*  BILITOT 42.0* 42.0*  PROT 6.5 6.6  ALBUMIN 2.2* 2.1*   Recent Labs  Lab 09/14/22 1915 09/15/22 2136  LIPASE 27 27   No results for input(s): "AMMONIA" in the last 168 hours. Coagulation Profile: No results for input(s): "INR", "PROTIME" in the last 168 hours. Cardiac Enzymes: No results for input(s): "CKTOTAL", "CKMB", "CKMBINDEX", "TROPONINI", "TROPONINIHS" in the last 168 hours. BNP (last 3 results) No results for input(s): "PROBNP" in the last 8760 hours. HbA1C: No results for input(s): "HGBA1C" in the last 72 hours. CBG: No results for input(s): "GLUCAP" in the last 168 hours. Lipid  Profile: No results for input(s): "CHOL", "HDL", "LDLCALC", "TRIG", "CHOLHDL", "LDLDIRECT" in the last 72 hours. Thyroid Function Tests: No results for input(s): "TSH", "T4TOTAL", "FREET4", "T3FREE", "THYROIDAB" in the last 72 hours. Anemia Panel: No results for input(s): "VITAMINB12", "FOLATE", "FERRITIN", "TIBC", "IRON", "RETICCTPCT" in the last 72 hours. Urine analysis:    Component Value Date/Time   COLORURINE YELLOW 09/07/2019 1642   APPEARANCEUR CLEAR 09/07/2019 1642   LABSPEC 1.025 09/07/2019 1642   PHURINE 6.0 09/07/2019 1642   GLUCOSEU NEGATIVE 09/07/2019 1642   HGBUR TRACE (A) 09/07/2019 1642   BILIRUBINUR NEGATIVE 09/07/2019 1642   KETONESUR NEGATIVE 09/07/2019 1642   PROTEINUR NEGATIVE 09/07/2019 1642   UROBILINOGEN 1 11/06/2013 1149   NITRITE NEGATIVE 09/07/2019 1642   LEUKOCYTESUR NEGATIVE 09/07/2019 1642    Radiological Exams on Admission: I have personally reviewed images CT ABDOMEN PELVIS WO CONTRAST  Result Date: 09/15/2022 CLINICAL DATA:  Left-sided abdominal pain, diarrhea EXAM: CT ABDOMEN AND PELVIS WITHOUT CONTRAST TECHNIQUE: Multidetector CT imaging of the abdomen and pelvis was performed following the standard protocol without IV contrast. RADIATION DOSE REDUCTION: This exam was performed according to the departmental dose-optimization program which includes automated exposure control, adjustment of the mA and/or kV according to patient size and/or use of iterative reconstruction technique. COMPARISON:  Abdomen sonogram done earlier today, CT abdomen done on 11/27/2004. FINDINGS: Lower chest: Pacer leads are noted in place. Visualized lower lung fields are clear. Hepatobiliary: There is abnormal dilation of intrahepatic and extrahepatic bile ducts. Distal common bile duct in the head of the pancreas measures 14 mm. Gallbladder is distended. There is no wall thickening in gallbladder. There is no fluid around the gallbladder. Pancreas: No focal abnormalities are seen  in pancreas. Evaluation is limited in this noncontrast study. Spleen: Unremarkable. Adrenals/Urinary Tract: Adrenals are unremarkable. There is no hydronephrosis. Small calcifications are seen in left renal artery branches. There are no demonstrable renal or ureteral stones. Urinary bladder is not distended. Stomach/Bowel: Stomach is not distended. Small bowel loops  are not dilated. Appendix is not distinctly seen. There is no pericecal inflammation. There is abnormal wall thickening in sigmoid colon measuring up to 2.4 cm in thickness. Vascular/Lymphatic: There are calcifications in abdominal aorta and its major branches. There is aneurysmal dilation of infrarenal aorta measuring 4.6 x 4.1 cm. Evaluation of lumen is limited in this noncontrast study. There is aneurysmal dilation of right common iliac artery measuring 2.4 cm. There is aneurysmal dilation in left common iliac artery measuring 2.3 cm. There is no demonstrable retroperitoneal hematoma. No significant lymphadenopathy is seen. Reproductive: Prostate is enlarged. Other: There is no ascites or pneumoperitoneum. Umbilical hernia containing fat is seen. Musculoskeletal: Degenerative changes are noted in lumbar spine with disc space narrowing, bony spurs, facet hypertrophy and encroachment of neural foramina at multiple levels, more so at L4-L5 and L5-S1 levels. IMPRESSION: There is no evidence of intestinal obstruction or pneumoperitoneum. There is no hydronephrosis. There is abnormal dilation of intrahepatic and extrahepatic bile ducts. Distal common bile duct measures 1.4 cm. Evaluation of pancreas in the region of ampulla is limited in this noncontrast study. Findings may be related to noncalcified calculus in the distal common bile duct or stricture in the distal common bile duct. Follow-up MRI and MRCP may be considered. Distention of gallbladder may be due to stricture in distal common bile duct and possibly fasting state of the patient. There is  abnormal wall thickening in sigmoid colon. Possibility of malignant neoplastic process in sigmoid colon is not excluded. Endoscopic correlation should be considered. There is aneurysmal dilation of infrarenal abdominal aorta measuring 4.6 cm in diameter. There is aneurysmal dilation of both common iliac arteries measuring up to 2.4 cm. Recommend follow-up every 6 months and vascular consultation.Reference: J Am Coll Radiol 3790;24:[id number]. Enlarged prostate.  Lumbar spondylosis. Other findings as described in the body of the report. Electronically Signed   By: Elmer Picker M.D.   On: 09/15/2022 21:45   US Abdomen Limited RUQ (LIVER/GB)  Result Date: 09/15/2022 CLINICAL DATA:  Pain right upper quadrant EXAM: ULTRASOUND ABDOMEN LIMITED RIGHT UPPER QUADRANT COMPARISON:  None Available. FINDINGS: Gallbladder: There are no demonstrable gallbladder stones. Technologist did not observe any tenderness over the gallbladder. There are low-level echoes in the lumen suggesting possible sludge. There is no fluid around the gallbladder. Common bile duct: Diameter: Common bile duct measures 1.5 cm. There is dilation of intrahepatic bile ducts. Distal common bile duct is obscured by bowel gas. Liver: There is increased echogenicity in liver. No focal abnormalities are seen in the visualized portions of liver. Portal vein is patent on color Doppler imaging with normal direction of blood flow towards the liver. Other: None. IMPRESSION: Sludge is seen in the lumen of gallbladder. There are no demonstrable gallbladder stones or imaging signs of acute cholecystitis. There is dilation of intrahepatic and visualized proximal extrahepatic bile ducts. Proximal common bile duct measures 1.5 cm in diameter. This may suggest stricture or calculus in the distal common bile duct. Distal common bile duct is obscured by bowel gas and not evaluated. If clinically warranted, follow-up CT may be considered. Electronically Signed   By:  Elmer Picker M.D.   On: 09/15/2022 18:28    EKG: My personal interpretation of EKG shows: no EKG  Assessment/Plan Principal Problem:   Hyperbilirubinemia Active Problems:   Hypokalemia   Common bile duct dilatation   AKI (acute kidney injury) ([place])   Essential hypertension, benign   Chronic systolic CHF (congestive heart failure) ([facility])   NICM (nonischemic cardiomyopathy) (  [place])   ICD (implantable cardioverter-defibrillator) in place    Assessment and Plan: * Hyperbilirubinemia Admit to telemetry bed. Concern for neoplastic process. Will need MRCP and GI consult.   AKI (acute kidney injury) ([place]) Hold diuretics, Entresto. Continue with gentle IVF.  Common bile duct dilatation Due to weight loss and dilated CBD, there is a concern for neoplastic process. Will need GI consult.  Hypokalemia Replete with IV KCL. Hold diuretics.  ICD (implantable cardioverter-defibrillator) in place Chronic. Will need MRCP in the AM.  NICM (nonischemic cardiomyopathy) ([facility]) Chronic. Hold GDMT due to hypovolemia.  Chronic systolic CHF (congestive heart failure) ([facility]) Appears volume depleted. Continue with gentle IVF.  Essential hypertension, benign Hold HTN meds for now due to low BP.   DVT prophylaxis: SCDs Code Status: Full Code Family Communication: no family at bedside  Disposition Plan: return home  Consults called: EDP has consulted [facility]  Admission status: Inpatient, Telemetry bed   Kristopher Oppenheim, DO [facility] 09/16/2022, 2:00 AM

## 2022-09-16 NOTE — ED Provider Notes (Signed)
[facility] EMERGENCY DEPARTMENT Provider Note   CSN: [id number] Arrival date & time: 09/15/22  1623     History  Chief Complaint  Patient presents with   Abnormal Lab    [name] [name] is a 66 y.o. male.  58 old male with a significant past medical history aside from coronary artery disease who presents the ER today secondary to jaundice, weakness, decreased appetite and abnormal potassium found urgent care.  His daughter and her mother accompanied him to the hospital.  Sounds like they both talked to him and/or see him every day.  Initially I thought it might be depression as this is coming up on the year anniversary of his son dying suddenly.  For the last month or so he has had decreased appetite and started losing weight.  Over the last couple weeks they have noticed a significant change in his mental status was more sleepy and has had rapid progression of his weight loss.  Over the last week he barely get out of bed and any started becoming significantly jaundiced so they took him to urgent care yesterday.  There found his potassium to be low and advised ER evaluation.  Patient has a remote history of alcohol intake but never really consider himself an alcoholic.  He did drink a few weeks ago on his birthday.  No significant Tylenol use. no known history of hepatitis.    Abnormal Lab      Home Medications Prior to Admission medications   Medication Sig Start Date End Date Taking? Authorizing Provider  aspirin 81 MG EC tablet TAKE 1 TABLET(81 MG) BY MOUTH DAILY. SWALLOW WHOLE Patient taking differently: Take 81 mg by mouth daily. TAKE 1 TABLET(81 MG) BY MOUTH DAILY. SWALLOW WHOLE 11/04/21   Larey Dresser, MD  carvedilol (COREG) 25 MG tablet Take 1 tablet (25 mg total) by mouth 2 (two) times daily with a meal. 11/04/21   Larey Dresser, MD  dapagliflozin propanediol (FARXIGA) 10 MG TABS tablet Take 1 tablet (10 mg total) by mouth daily before breakfast. 11/04/21    Larey Dresser, MD  digoxin (LANOXIN) 0.125 MG tablet Take 1 tablet (0.125 mg total) by mouth daily. 11/04/21   Larey Dresser, MD  ezetimibe (ZETIA) 10 MG tablet TAKE 1 TABLET(10 MG) BY MOUTH DAILY 11/04/21   Larey Dresser, MD  isosorbide-hydrALAZINE (BIDIL) 20-37.5 MG tablet Take 2 tablets by mouth 3 (three) times daily. 11/04/21   Larey Dresser, MD  metFORMIN (GLUCOPHAGE XR) 500 MG 24 hr tablet Take 1 tablet (500 mg total) by mouth daily with breakfast. 12/20/17   Clent Demark, PA-C  rosuvastatin (CRESTOR) 40 MG tablet TAKE 1 TABLET(40 MG) BY MOUTH DAILY 11/04/21   Larey Dresser, MD  sacubitril-valsartan (ENTRESTO) 97-103 MG Take 1 tablet by mouth 2 (two) times daily. 11/04/21   Larey Dresser, MD  spironolactone (ALDACTONE) 50 MG tablet Take 1 tablet (50 mg total) by mouth daily. 02/16/22   Larey Dresser, MD  triamcinolone ointment (KENALOG) 0.5 % Apply 1 application topically 2 (two) times daily. 07/11/18   Clent Demark, PA-C      Allergies    Patient has no known allergies.    Review of Systems   Review of Systems  Physical Exam Updated Vital Signs BP 93/65   Pulse 84   Temp 98.9 F (37.2 C) (Oral)   Resp 20   SpO2 99%  Physical Exam Vitals and nursing note reviewed.  Constitutional:      Appearance: He is well-developed. He is cachectic. He is ill-appearing.  HENT:     Head: Normocephalic and atraumatic.     Mouth/Throat:     Mouth: Mucous membranes are dry.  Eyes:     General: Scleral icterus present.     Pupils: Pupils are equal, round, and reactive to light.  Cardiovascular:     Rate and Rhythm: Normal rate.  Pulmonary:     Effort: Pulmonary effort is normal. No respiratory distress.  Abdominal:     General: Abdomen is flat. There is no distension.  Musculoskeletal:        General: Normal range of motion.     Cervical back: Normal range of motion.  Skin:    General: Skin is warm and dry.  Neurological:     General: No focal deficit present.      Mental Status: He is alert.     ED Results / Procedures / Treatments   Labs (all labs ordered are listed, but only abnormal results are displayed) Labs Reviewed  CBC WITH DIFFERENTIAL/PLATELET - Abnormal; Notable for the following components:      Result Value   WBC 18.7 (*)    RDW 17.2 (*)    Neutro Abs 16.2 (*)    Monocytes Absolute 1.1 (*)    Abs Immature Granulocytes 0.23 (*)    All other components within normal limits  COMPREHENSIVE METABOLIC PANEL - Abnormal; Notable for the following components:   Potassium 2.7 (*)    Glucose, Bld 132 (*)    Creatinine, Ser 1.74 (*)    Albumin 2.1 (*)    AST 128 (*)    ALT 75 (*)    Alkaline Phosphatase 790 (*)    Total Bilirubin 42.0 (*)    GFR, Estimated 42 (*)    All other components within normal limits  DIGOXIN LEVEL - Abnormal; Notable for the following components:   Digoxin Level <0.2 (*)    All other components within normal limits  BILIRUBIN, DIRECT - Abnormal; Notable for the following components:   Bilirubin, Direct 28.0 (*)    All other components within normal limits  LACTIC ACID, PLASMA - Abnormal; Notable for the following components:   Lactic Acid, Venous 2.3 (*)    All other components within normal limits  I-STAT CHEM 8, ED - Abnormal; Notable for the following components:   Potassium 2.8 (*)    Creatinine, Ser 1.70 (*)    Glucose, Bld 138 (*)    Calcium, Ion 1.08 (*)    All other components within normal limits  LIPASE, BLOOD  LACTATE DEHYDROGENASE  HAPTOGLOBIN  CBC WITH DIFFERENTIAL/PLATELET  COMPREHENSIVE METABOLIC PANEL  DIGOXIN LEVEL  LIPASE, BLOOD  LACTATE DEHYDROGENASE  HAPTOGLOBIN  BILIRUBIN, DIRECT  PROTIME-INR  APTT  LACTIC ACID, PLASMA  PROTIME-INR  APTT  MAGNESIUM  AMMONIA    EKG None  Radiology CT ABDOMEN PELVIS WO CONTRAST  Result Date: 09/15/2022 CLINICAL DATA:  Left-sided abdominal pain, diarrhea EXAM: CT ABDOMEN AND PELVIS WITHOUT CONTRAST TECHNIQUE: Multidetector CT  imaging of the abdomen and pelvis was performed following the standard protocol without IV contrast. RADIATION DOSE REDUCTION: This exam was performed according to the departmental dose-optimization program which includes automated exposure control, adjustment of the mA and/or kV according to patient size and/or use of iterative reconstruction technique. COMPARISON:  Abdomen sonogram done earlier today, CT abdomen done on 11/27/2004. FINDINGS: Lower chest: Pacer leads are noted in place. Visualized lower lung fields  are clear. Hepatobiliary: There is abnormal dilation of intrahepatic and extrahepatic bile ducts. Distal common bile duct in the head of the pancreas measures 14 mm. Gallbladder is distended. There is no wall thickening in gallbladder. There is no fluid around the gallbladder. Pancreas: No focal abnormalities are seen in pancreas. Evaluation is limited in this noncontrast study. Spleen: Unremarkable. Adrenals/Urinary Tract: Adrenals are unremarkable. There is no hydronephrosis. Small calcifications are seen in left renal artery branches. There are no demonstrable renal or ureteral stones. Urinary bladder is not distended. Stomach/Bowel: Stomach is not distended. Small bowel loops are not dilated. Appendix is not distinctly seen. There is no pericecal inflammation. There is abnormal wall thickening in sigmoid colon measuring up to 2.4 cm in thickness. Vascular/Lymphatic: There are calcifications in abdominal aorta and its major branches. There is aneurysmal dilation of infrarenal aorta measuring 4.6 x 4.1 cm. Evaluation of lumen is limited in this noncontrast study. There is aneurysmal dilation of right common iliac artery measuring 2.4 cm. There is aneurysmal dilation in left common iliac artery measuring 2.3 cm. There is no demonstrable retroperitoneal hematoma. No significant lymphadenopathy is seen. Reproductive: Prostate is enlarged. Other: There is no ascites or pneumoperitoneum. Umbilical hernia  containing fat is seen. Musculoskeletal: Degenerative changes are noted in lumbar spine with disc space narrowing, bony spurs, facet hypertrophy and encroachment of neural foramina at multiple levels, more so at L4-L5 and L5-S1 levels. IMPRESSION: There is no evidence of intestinal obstruction or pneumoperitoneum. There is no hydronephrosis. There is abnormal dilation of intrahepatic and extrahepatic bile ducts. Distal common bile duct measures 1.4 cm. Evaluation of pancreas in the region of ampulla is limited in this noncontrast study. Findings may be related to noncalcified calculus in the distal common bile duct or stricture in the distal common bile duct. Follow-up MRI and MRCP may be considered. Distention of gallbladder may be due to stricture in distal common bile duct and possibly fasting state of the patient. There is abnormal wall thickening in sigmoid colon. Possibility of malignant neoplastic process in sigmoid colon is not excluded. Endoscopic correlation should be considered. There is aneurysmal dilation of infrarenal abdominal aorta measuring 4.6 cm in diameter. There is aneurysmal dilation of both common iliac arteries measuring up to 2.4 cm. Recommend follow-up every 6 months and vascular consultation.Reference: J Am Coll Radiol 6644;03:[id number]. Enlarged prostate.  Lumbar spondylosis. Other findings as described in the body of the report. Electronically Signed   By: Elmer Picker M.D.   On: 09/15/2022 21:45   US Abdomen Limited RUQ (LIVER/GB)  Result Date: 09/15/2022 CLINICAL DATA:  Pain right upper quadrant EXAM: ULTRASOUND ABDOMEN LIMITED RIGHT UPPER QUADRANT COMPARISON:  None Available. FINDINGS: Gallbladder: There are no demonstrable gallbladder stones. Technologist did not observe any tenderness over the gallbladder. There are low-level echoes in the lumen suggesting possible sludge. There is no fluid around the gallbladder. Common bile duct: Diameter: Common bile duct measures 1.5  cm. There is dilation of intrahepatic bile ducts. Distal common bile duct is obscured by bowel gas. Liver: There is increased echogenicity in liver. No focal abnormalities are seen in the visualized portions of liver. Portal vein is patent on color Doppler imaging with normal direction of blood flow towards the liver. Other: None. IMPRESSION: Sludge is seen in the lumen of gallbladder. There are no demonstrable gallbladder stones or imaging signs of acute cholecystitis. There is dilation of intrahepatic and visualized proximal extrahepatic bile ducts. Proximal common bile duct measures 1.5 cm in diameter. This may suggest  stricture or calculus in the distal common bile duct. Distal common bile duct is obscured by bowel gas and not evaluated. If clinically warranted, follow-up CT may be considered. Electronically Signed   By: Elmer Picker M.D.   On: 09/15/2022 18:28    Procedures .Critical Care  Performed by: Merrily Pew, MD Authorized by: Merrily Pew, MD   Critical care provider statement:    Critical care time (minutes):  30   Critical care was necessary to treat or prevent imminent or life-threatening deterioration of the following conditions:  Metabolic crisis   Critical care was time spent personally by me on the following activities:  Development of treatment plan with patient or surrogate, discussions with consultants, evaluation of patient's response to treatment, examination of patient, ordering and review of laboratory studies, ordering and review of radiographic studies, ordering and performing treatments and interventions, pulse oximetry, re-evaluation of patient's condition and review of old charts     Medications Ordered in ED Medications  potassium chloride 10 mEq in 100 mL IVPB (10 mEq Intravenous New Bag/Given 09/16/22 0129)  magnesium sulfate IVPB 2 g 50 mL (2 g Intravenous New Bag/Given 09/16/22 0130)    ED Course/ Medical Decision Making/ A&P                            Medical Decision Making Amount and/or Complexity of Data Reviewed Labs: ordered.  Risk Prescription drug management. Decision regarding hospitalization.   Significant hyperbilirubinemia along with mild AKI and hypokalemia.  Concern for some type of malignancy rather than choledocholithiasis.  Radiology is recommending an MR CP however patient does have a pacemaker/cannot be done in the ER and either way needs GI workup.  He has had a colonoscopy in the past but is unsure who it was and symptoms unsure as well and there is no records in the computer to state the same.  Consulted on call for [place] per institutional protocol. Discussed with [facility] for admission.   Final Clinical Impression(s) / ED Diagnoses Final diagnoses:  Hyperbilirubinemia    Rx / DC Orders ED Discharge Orders     None         Pearlene Teat, Corene Cornea, MD 09/16/22 [phone]

## 2022-09-16 NOTE — Consult Note (Addendum)
Attending physician's note   I have taken a history, reviewed the chart, and examined the patient. I performed a substantive portion of this encounter, including complete performance of at least one of the key components, in conjunction with the APP. I agree with the APP's note, impression, and recommendations with my edits.   66 year old male with medical history as found below, to clued history of CHF with EF 25%, nonischemic cardiomyopathy s/p ICD, presenting with painless jaundice and diarrhea.  Has been having diarrhea over the last couple of weeks with 4-5 loose, nonbloody stools daily.  Does have a history of heavy EtOH use, but stopped drinking over the last couple weeks due to not feeling well.  Admission evaluation notable for the following: - WBC 18.7, H/H 14/40, PLT 377 - AST/ALT 128/75, ALP 790, T. bili 42 - BUNs/creatinine 16/1.7 (baseline 11/0.97 in 01/2022) - INR 2.2 - Lactate 2.3 --> 2.0 - Ammonia 55 - BNP 215 - Ultrasound: Dilated intra/extrahepatic ducts with CBD 1.5 cm, distended GB with sludge but no cholecystitis - CT: Dilated intra/extrahepatic ducts with CBD 14 mm, distended GB.  Area of thickening of the sigmoid   1) Jaundice 2) Elevated liver enzymes 3) Dilated intrahepatic/extrahepatic ducts - Unable to undergo MRI/MRCP due to noncompatible ICD - Plan for ERCP tomorrow with Dr. Lyndel Safe for further evaluation and decompressive stent placement - Clears okay today with n.p.o. midnight - Continue trending liver enzymes  4) Diarrhea 5) Leukocytosis - Check GI PCR panel including C. difficile  6) CHF - Management per Cardiology service and primary Hospitalist service - Hold ASA for now  The indications, risks, and benefits of ERCP were explained to the patient in detail. Risks include but are not limited to bleeding, perforation, adverse reaction to medications, pancreatitis, inability to cannulate duct, and cardiopulmonary compromise. Sequelae include but  are not limited to the possibility of surgery, prolonged hospitalization, and mortality. The patient verbalized understanding and wishes to proceed. All questions answered to the best of my ability.    [address], DO, [place] [phone] office           Consultation  Referring Provider:   Dr. Candiss Norse Primary Care Physician:  Pcp, No Primary Gastroenterologist: Althia Forts       Reason for Consultation: Hyperbilirubinemia             HPI:   [name] [name] is a 66 y.o. male with a past medical history of nonischemic cardiomyopathy status post ICD placement, chronic systolic heart failure with an EF of 25%, hypertension and others listed below, who presented to the ER from the urgent care on 09/15/2022 for abnormal labs showing hyperbilirubinemia.    Apparently patient seen in urgent care on 12/19 due to 2 weeks of diarrhea, had labs drawn and revealed a sodium of 134, potassium 2.9, creatinine 1.65 and a bilirubin of 4.2 with alk phos of 86.    At time of presentation to the ER yesterday describe 2 weeks of diarrhea with very little appetite and weight loss but not sure how much.    Today, the patient tells me that over the past couple of weeks he has had diarrhea at least 4-5 times a day which has kept him from leaving the house.  Tells me he thinks everything started to go downhill after he went to the dentist to get dentures, but denies being on any new meds or antibiotics around that time.  Does have occasional left lower quadrant abdominal pain which seems  off-and-on, sometimes better after bowel movement and has had decreased appetite over the past couple of weeks.  Does describe prior alcohol abuse with at least 3 to 4 glasses of wine a night over the past year, but stopped drinking at all about 3 to 4 weeks ago because he just started feeling bad.  He did try Pepto-Bismol at home which helped a little bit.  Apparently has also been off of all of his other chronic medications since a  dental procedure a couple weeks ago.  No nausea or vomiting.    Denies fever or chills.  ER course: Sodium 136, potassium 2.7, total bili 42, alk phos 790, AST 128, ALT 75, LDH 164, WBC 18.7, hemoglobin 13.9; right upper quadrant ultrasound with sludge in the lumen of the gallbladder, no demonstratable gallbladder stones, dilation of the intrahepatic and proximal extrahepatic ducts, proximal common bile duct measured 1.5 cm in diameter; CT scan without contrast showed 1.4 cm at the distal common bile duct with abnormal wall thickening of the sigmoid colon; could not get MRCP due to defibrillator  GI History: No known prior GI history  Past Medical History:  Diagnosis Date   AICD (automatic cardioverter/defibrillator) present    Chronic systolic CHF (congestive heart failure) ([place]) 01/31/2015   Coronary artery disease    Hypertension    Nonischemic cardiomyopathy ([place]) 06/30/2015    Past Surgical History:  Procedure Laterality Date   CARDIAC CATHETERIZATION N/A 02/04/2015   Procedure: Right/Left Heart Cath and Coronary Angiography;  Surgeon: Larey Dresser, MD;  Location: [facility];  Service: Cardiovascular;  Laterality: N/A;   CARDIAC DEFIBRILLATOR PLACEMENT  07/09/2015   CHALAZION EXCISION Left 01/16/2014   Procedure: MINOR EXCISION OF CHALAZION;  Surgeon: Myrtha Mantis., MD;  Location: [place] [place];  Service: Ophthalmology;  Laterality: Left;   EP IMPLANTABLE DEVICE N/A 07/09/2015   Procedure: ICD Implant;  Surgeon: Deboraha Sprang, MD;  Location: [facility];  Service: Cardiovascular;  Laterality: N/A;   EYE SURGERY Left ~ 2013   "eye infection"    Family History  Problem Relation Age of Onset   Stroke Mother    Heart Problems Other     Social History   Tobacco Use   Smoking status: Every Day    Packs/day: 1.00    Years: 40.00    Total pack years: 40.00    Types: Cigarettes   Smokeless tobacco: Never  Substance Use Topics   Alcohol  use: Not Currently    Comment: 07/09/2015 "I haven't had a drink since last of 12/2014"   Drug use: Not Currently    Types: "Crack" cocaine, Marijuana    Comment: 07/09/2015 "I did drugs a long time;ago  quit in the mid 1980's"    Prior to Admission medications   Medication Sig Start Date End Date Taking? Authorizing Provider  aspirin 81 MG EC tablet TAKE 1 TABLET(81 MG) BY MOUTH DAILY. SWALLOW WHOLE Patient taking differently: Take 81 mg by mouth daily. TAKE 1 TABLET(81 MG) BY MOUTH DAILY. SWALLOW WHOLE 11/04/21  Yes Larey Dresser, MD  carvedilol (COREG) 25 MG tablet Take 1 tablet (25 mg total) by mouth 2 (two) times daily with a meal. 11/04/21  Yes Larey Dresser, MD  dapagliflozin propanediol (FARXIGA) 10 MG TABS tablet Take 1 tablet (10 mg total) by mouth daily before breakfast. 11/04/21  Yes Larey Dresser, MD  digoxin (LANOXIN) 0.125 MG tablet Take 1 tablet (0.125 mg total) by  mouth daily. 11/04/21  Yes Larey Dresser, MD  isosorbide-hydrALAZINE (BIDIL) 20-37.5 MG tablet Take 2 tablets by mouth 3 (three) times daily. Patient taking differently: Take 1 tablet by mouth in the morning and at bedtime. 11/04/21  Yes Larey Dresser, MD  ezetimibe (ZETIA) 10 MG tablet TAKE 1 TABLET(10 MG) BY MOUTH DAILY 11/04/21   Larey Dresser, MD  metFORMIN (GLUCOPHAGE XR) 500 MG 24 hr tablet Take 1 tablet (500 mg total) by mouth daily with breakfast. 12/20/17   Clent Demark, PA-C  rosuvastatin (CRESTOR) 40 MG tablet TAKE 1 TABLET(40 MG) BY MOUTH DAILY 11/04/21   Larey Dresser, MD  sacubitril-valsartan (ENTRESTO) 97-103 MG Take 1 tablet by mouth 2 (two) times daily. 11/04/21   Larey Dresser, MD  spironolactone (ALDACTONE) 50 MG tablet Take 1 tablet (50 mg total) by mouth daily. 02/16/22   Larey Dresser, MD  triamcinolone ointment (KENALOG) 0.5 % Apply 1 application topically 2 (two) times daily. 07/11/18   Clent Demark, PA-C    Current Facility-Administered Medications  Medication Dose Route  Frequency Provider Last Rate Last Admin   [START ON 09/17/2022] carvedilol (COREG) tablet 3.125 mg  3.125 mg Oral BID WC Thurnell Lose, MD       [START ON 09/17/2022] digoxin (LANOXIN) tablet 0.125 mg  0.125 mg Oral Daily Thurnell Lose, MD       heparin injection 5,000 Units  5,000 Units Subcutaneous Q8H Thurnell Lose, MD       [START ON 09/17/2022] isosorbide mononitrate (IMDUR) 24 hr tablet 15 mg  15 mg Oral Daily Thurnell Lose, MD       lactated ringers 1,000 mL with potassium chloride 60 mEq infusion   Intravenous Continuous Thurnell Lose, MD 100 mL/hr at 09/16/22 0653 New Bag at 09/16/22 0653   lactulose (CHRONULAC) 10 GM/15ML solution 20 g  20 g Oral BID Thurnell Lose, MD       melatonin tablet 10 mg  10 mg Oral QHS PRN Kristopher Oppenheim, DO       ondansetron [facility]) tablet 4 mg  4 mg Oral Q6H PRN Kristopher Oppenheim, DO       Or   ondansetron [facility]) injection 4 mg  4 mg Intravenous Q6H PRN Kristopher Oppenheim, DO       pantoprazole (PROTONIX) EC tablet 40 mg  40 mg Oral Daily Thurnell Lose, MD       potassium chloride (KLOR-CON) packet 40 mEq  40 mEq Oral BID Thurnell Lose, MD       Current Outpatient Medications  Medication Sig Dispense Refill   aspirin 81 MG EC tablet TAKE 1 TABLET(81 MG) BY MOUTH DAILY. SWALLOW WHOLE (Patient taking differently: Take 81 mg by mouth daily. TAKE 1 TABLET(81 MG) BY MOUTH DAILY. SWALLOW WHOLE) 30 tablet 11   carvedilol (COREG) 25 MG tablet Take 1 tablet (25 mg total) by mouth 2 (two) times daily with a meal. 60 tablet 6   dapagliflozin propanediol (FARXIGA) 10 MG TABS tablet Take 1 tablet (10 mg total) by mouth daily before breakfast. 30 tablet 11   digoxin (LANOXIN) 0.125 MG tablet Take 1 tablet (0.125 mg total) by mouth daily. 90 tablet 3   isosorbide-hydrALAZINE (BIDIL) 20-37.5 MG tablet Take 2 tablets by mouth 3 (three) times daily. (Patient taking differently: Take 1 tablet by mouth in the morning and at bedtime.) 180 tablet 11   ezetimibe  (ZETIA) 10 MG tablet TAKE 1 TABLET(10 MG)  BY MOUTH DAILY 90 tablet 3   metFORMIN (GLUCOPHAGE XR) 500 MG 24 hr tablet Take 1 tablet (500 mg total) by mouth daily with breakfast. 90 tablet 3   rosuvastatin (CRESTOR) 40 MG tablet TAKE 1 TABLET(40 MG) BY MOUTH DAILY 90 tablet 3   sacubitril-valsartan (ENTRESTO) 97-103 MG Take 1 tablet by mouth 2 (two) times daily. 60 tablet 6   spironolactone (ALDACTONE) 50 MG tablet Take 1 tablet (50 mg total) by mouth daily. 90 tablet 3   triamcinolone ointment (KENALOG) 0.5 % Apply 1 application topically 2 (two) times daily. 15 g 0    Allergies as of 09/15/2022   (No Known Allergies)     Review of Systems:    Constitutional: No weight loss, fever or chills Skin: No rash Cardiovascular: No chest pain Respiratory: No SOB  Gastrointestinal: See HPI and otherwise negative Genitourinary: No dysuria  Neurological: No headache, dizziness or syncope Musculoskeletal: No new muscle or joint pain Hematologic: No bleeding  Psychiatric: No history of depression or anxiety    Physical Exam:  Vital signs in last 24 hours: Temp:  [97.8 F (36.6 C)-99.3 F (37.4 C)] 98.9 F (37.2 C) (12/21 0830) Pulse Rate:  [53-102] 86 (12/21 0805) Resp:  [11-25] 17 (12/21 0805) BP: (79-153)/(61-97) 110/72 (12/21 0805) SpO2:  [99 %-100 %] 99 % (12/21 0805)   General:   Pleasant cachectic and ill appearing AA male appears to be in NAD, Well developed, Well nourished, alert and cooperative Head:  Normocephalic and atraumatic. Eyes:   PEERL, EOMI. +icterus Conjunctiva pink. Ears:  Normal auditory acuity. Neck:  Supple Throat: Oral cavity and pharynx without inflammation, swelling or lesion. +Edentulous on the top Lungs: Respirations even and unlabored. Lungs clear to auscultation bilaterally.   No wheezes, crackles, or rhonchi.  Heart: Normal S1, S2. No MRG. Regular rate and rhythm. No peripheral edema, cyanosis or pallor.  Abdomen:  +hardened (somewhat limited by patients  positioning), nondistended, nontender. No rebound or guarding. Normal bowel sounds. No appreciable masses or hepatomegaly. Rectal:  Not performed.  Msk:  Symmetrical without gross deformities. Peripheral pulses intact.  Extremities:  Without edema, no deformity or joint abnormality.  Neurologic:  Alert and  oriented x4;  grossly normal neurologically.  Skin:   Dry and intact without significant lesions or rashes. Psychiatric: Demonstrates good judgement and reason without abnormal affect or behaviors.   LAB RESULTS: Recent Labs    09/14/22 1915 09/15/22 2136 09/15/22 2208  WBC 16.7* 18.7*  --   HGB 14.6 13.9 15.6  HCT 40.3 40.4 46.0  PLT 421* 377  --    BMET Recent Labs    09/14/22 1915 09/15/22 2136 09/15/22 2208  NA 134* 136 135  K 2.9* 2.7* 2.8*  CL 94* 98 99  CO2 22 24  --   GLUCOSE 123* 132* 138*  BUN _0 CREATININE 1.65* 1.74* 1.70*  CALCIUM 9.4 9.2  --    LFT Recent Labs    09/15/22 2136  PROT 6.6  ALBUMIN 2.1*  AST 128*  ALT 75*  ALKPHOS 790*  BILITOT 42.0*  BILIDIR 28.0*   PT/INR Recent Labs    09/16/22 0700  LABPROT 24.2*  INR 2.2*    STUDIES: CT ABDOMEN PELVIS WO CONTRAST  Result Date: 09/15/2022 CLINICAL DATA:  Left-sided abdominal pain, diarrhea EXAM: CT ABDOMEN AND PELVIS WITHOUT CONTRAST TECHNIQUE: Multidetector CT imaging of the abdomen and pelvis was performed following the standard protocol without IV contrast. RADIATION DOSE REDUCTION: This exam  was performed according to the departmental dose-optimization program which includes automated exposure control, adjustment of the mA and/or kV according to patient size and/or use of iterative reconstruction technique. COMPARISON:  Abdomen sonogram done earlier today, CT abdomen done on 11/27/2004. FINDINGS: Lower chest: Pacer leads are noted in place. Visualized lower lung fields are clear. Hepatobiliary: There is abnormal dilation of intrahepatic and extrahepatic bile ducts. Distal common  bile duct in the head of the pancreas measures 14 mm. Gallbladder is distended. There is no wall thickening in gallbladder. There is no fluid around the gallbladder. Pancreas: No focal abnormalities are seen in pancreas. Evaluation is limited in this noncontrast study. Spleen: Unremarkable. Adrenals/Urinary Tract: Adrenals are unremarkable. There is no hydronephrosis. Small calcifications are seen in left renal artery branches. There are no demonstrable renal or ureteral stones. Urinary bladder is not distended. Stomach/Bowel: Stomach is not distended. Small bowel loops are not dilated. Appendix is not distinctly seen. There is no pericecal inflammation. There is abnormal wall thickening in sigmoid colon measuring up to 2.4 cm in thickness. Vascular/Lymphatic: There are calcifications in abdominal aorta and its major branches. There is aneurysmal dilation of infrarenal aorta measuring 4.6 x 4.1 cm. Evaluation of lumen is limited in this noncontrast study. There is aneurysmal dilation of right common iliac artery measuring 2.4 cm. There is aneurysmal dilation in left common iliac artery measuring 2.3 cm. There is no demonstrable retroperitoneal hematoma. No significant lymphadenopathy is seen. Reproductive: Prostate is enlarged. Other: There is no ascites or pneumoperitoneum. Umbilical hernia containing fat is seen. Musculoskeletal: Degenerative changes are noted in lumbar spine with disc space narrowing, bony spurs, facet hypertrophy and encroachment of neural foramina at multiple levels, more so at L4-L5 and L5-S1 levels. IMPRESSION: There is no evidence of intestinal obstruction or pneumoperitoneum. There is no hydronephrosis. There is abnormal dilation of intrahepatic and extrahepatic bile ducts. Distal common bile duct measures 1.4 cm. Evaluation of pancreas in the region of ampulla is limited in this noncontrast study. Findings may be related to noncalcified calculus in the distal common bile duct or stricture  in the distal common bile duct. Follow-up MRI and MRCP may be considered. Distention of gallbladder may be due to stricture in distal common bile duct and possibly fasting state of the patient. There is abnormal wall thickening in sigmoid colon. Possibility of malignant neoplastic process in sigmoid colon is not excluded. Endoscopic correlation should be considered. There is aneurysmal dilation of infrarenal abdominal aorta measuring 4.6 cm in diameter. There is aneurysmal dilation of both common iliac arteries measuring up to 2.4 cm. Recommend follow-up every 6 months and vascular consultation.Reference: J Am Coll Radiol 0960;[id number]. Enlarged prostate.  Lumbar spondylosis. Other findings as described in the body of the report. Electronically Signed   By: Elmer Picker M.D.   On: 09/15/2022 21:45   US Abdomen Limited RUQ (LIVER/GB)  Result Date: 09/15/2022 CLINICAL DATA:  Pain right upper quadrant EXAM: ULTRASOUND ABDOMEN LIMITED RIGHT UPPER QUADRANT COMPARISON:  None Available. FINDINGS: Gallbladder: There are no demonstrable gallbladder stones. Technologist did not observe any tenderness over the gallbladder. There are low-level echoes in the lumen suggesting possible sludge. There is no fluid around the gallbladder. Common bile duct: Diameter: Common bile duct measures 1.5 cm. There is dilation of intrahepatic bile ducts. Distal common bile duct is obscured by bowel gas. Liver: There is increased echogenicity in liver. No focal abnormalities are seen in the visualized portions of liver. Portal vein is patent on color Doppler imaging with  normal direction of blood flow towards the liver. Other: None. IMPRESSION: Sludge is seen in the lumen of gallbladder. There are no demonstrable gallbladder stones or imaging signs of acute cholecystitis. There is dilation of intrahepatic and visualized proximal extrahepatic bile ducts. Proximal common bile duct measures 1.5 cm in diameter. This may suggest  stricture or calculus in the distal common bile duct. Distal common bile duct is obscured by bowel gas and not evaluated. If clinically warranted, follow-up CT may be considered. Electronically Signed   By: Elmer Picker M.D.   On: 09/15/2022 18:28     Impression / Plan:   Impression: 1.  Elevated LFTs: With common bile duct dilation as well as thickening of the sigmoid colon on imaging, LFTs as above with a bilirubin of 42; high likelihood of malignancy versus other bile duct obstruction 2.  CHF: Ejection fraction 25% at last check 02/16/2022 3.  ICD in place 4.  Hypokalemia: Currently 2.8 5.  Hypertension 6.  Diarrhea: For the past 2 weeks, "uncontrollable" per the patient describes 4-5 loose urgent bowel movements a day that kept him from leaving the house, no real abdominal pain; consider infectious versus inflammatory versus relation to hyperbilirubinemia versus other  Plan: 1.  Continue correction of Potassium so that patient can have ERCP tomorrow.  Appreciate help of the hospitalist team. 2.  Scheduled patient for ERCP tomorrow afternoon at 2:00 with Dr. Lyndel Safe.  Did discuss risks, benefits, limitations and alternatives and the patient agrees to proceed. 3.  Heparin will need to be held starting tomorrow 12/22 4.  Continue to monitor LFTs 5.  Continue Pantoprazole 6.  Patient described diarrhea, will add on stool studies 7.  Patient will be n.p.o. at midnight, can have clear or soft diet today as tolerated  Thank you for your kind consultation, we will continue to follow.  [name] [name]  09/16/2022, 9:00 AM

## 2022-09-16 NOTE — Progress Notes (Signed)
PROGRESS NOTE                                                                                                                                                                                                             Patient Demographics:    [name] [name], is a 66 y.o. male, DOB - January 24, 1956, RSW:[id number]  Outpatient Primary MD for the patient is Pcp, No    LOS - 0  Admit date - 09/15/2022    Chief Complaint  Patient presents with   Abnormal Lab       Brief Narrative (HPI from H&P)   66 year old African-American male history of nonischemic cardiomyopathy status post ICD placement, chronic systolic heart failure with an EF of 25%, chronic tobacco abuse, hypertension who presents to the ER from urgent care due to abnormal labs. He was seen in urgent care on the 19th due to 2 weeks of diarrhea. He had lab was drawn in urgent care. It revealed a sodium of 134, potassium 2.9, creatinine 1.65, bilirubin of 42, alk phos of 86.    Subjective:    [name] [name] today has, No headache, No chest pain, No abdominal pain - No Nausea, No new weakness tingling or numbness, no SOB.   Assessment  & Plan :    Hyperbilirubinemia, severe jaundice with unintentional weight loss.  Imaging of the abdomen pelvis suggestive of CBD stricture/stone along with sigmoid colon thickening, patient does not have pain, unfortunately he has AICD which is not MRI compatible.  GI will be consulted.  Question if he has cholangiocarcinoma.  Will defer workup to GI.  AKI (acute kidney injury) ([place])  Hold diuretics, Entresto. Continue with gentle IVF.  Hypokalemia - Replete with IV KCL. Hold diuretics.  Nonischemic cardiomyopathy with chronic systolic heart failure EF 25% on recent echo, s/p AICD in place.  Currently severely dehydrated.  Beta-blocker, ACE/ARB, Entresto and diuretics held.  Being hydrated with IV fluids.  Essential hypertension, benign   Hold HTN meds for now due to low BP.        Condition - Extremely Guarded  Family Communication  :  daughter Elmo [name] [phone]   on 09/16/22  Code Status :  Full  Consults  :  GI  PUD Prophylaxis : PPI   Procedures  :     CT - There is no evidence  of intestinal obstruction or pneumoperitoneum. There is no hydronephrosis. There is abnormal dilation of intrahepatic and extrahepatic bile ducts. Distal common bile duct measures 1.4 cm. Evaluation of pancreas in the region of ampulla is limited in this noncontrast study. Findings may be related to noncalcified calculus in the distal common bile duct or stricture in the distal common bile duct. Follow-up MRI and MRCP may be considered. Distention of gallbladder may be due to stricture in distal common bile duct and possibly fasting state of the patient. There is abnormal wall thickening in sigmoid colon. Possibility of malignant neoplastic process in sigmoid colon is not excluded. Endoscopic correlation should be considered. There is aneurysmal dilation of infrarenal abdominal aorta measuring 4.6 cm in diameter. There is aneurysmal dilation of both common iliac arteries measuring up to 2.4 cm. Recommend follow-up every 6 months and vascular consultation.  [place] - Sludge is seen in the lumen of gallbladder. There are no demonstrable gallbladder stones or imaging signs of acute cholecystitis. There is dilation of intrahepatic and visualized proximal extrahepatic bile ducts. Proximal common bile duct measures 1.5 cm in diameter. This may suggest stricture or calculus in the distal common bile duct. Distal common bile duct is obscured by bowel gas and not evaluated. If clinically warranted, follow-up CT may be considered.      Disposition Plan  :    Status is: Inpatient   DVT Prophylaxis  :    SCDs Start: 09/16/22 0505    Lab Results  Component Value Date   PLT 377 09/15/2022    Diet :  Diet Order             Diet clear liquid Room  service appropriate? Yes; Fluid consistency: Thin  Diet effective now                    Inpatient Medications  Scheduled Meds:  [START ON 09/17/2022] carvedilol  3.125 mg Oral BID WC   [START ON 09/17/2022] digoxin  0.125 mg Oral Daily   [START ON 09/17/2022] isosorbide mononitrate  15 mg Oral Daily   potassium chloride  40 mEq Oral BID   Continuous Infusions:  lactated ringers 1,000 mL with potassium chloride 60 mEq infusion 100 mL/hr at 09/16/22 0653   PRN Meds:.melatonin, ondansetron **OR** ondansetron (ZOFRAN) IV  Antibiotics  :    Anti-infectives (From admission, onward)    None        Objective:   Vitals:   09/16/22 0600 09/16/22 0640 09/16/22 0700 09/16/22 0805  BP: 93/61 117/71 98/66 110/72  Pulse:      Resp: (!) _0 Temp:      TempSrc:      SpO2:    99%    Wt Readings from Last 3 Encounters:  02/16/22 66.7 kg  12/14/21 66.9 kg  11/23/21 67.8 kg     Intake/Output Summary (Last 24 hours) at 09/16/2022 0820 Last data filed at 09/16/2022 3710 Gross per 24 hour  Intake 419.83 ml  Output --  Net 419.83 ml     Physical Exam  Frail elderly African-American gentleman severely jaundiced, awake Alert, No new F.N deficits, Normal affect [place].AT, severe scleral icterus Supple Neck, No JVD,   Symmetrical Chest wall movement, Good air movement bilaterally, CTAB RRR,No Gallops,Rubs or new Murmurs,  +ve B.Sounds, Abd Soft, No tenderness,   No Cyanosis, Clubbing or edema       Data Review:    Recent Labs  Lab 09/14/22 1915 09/15/22 2136  09/15/22 2208  WBC 16.7* 18.7*  --   HGB 14.6 13.9 15.6  HCT 40.3 40.4 46.0  PLT 421* 377  --   MCV 92.6 95.7  --   MCH 33.6 32.9  --   MCHC 36.2* 34.4  --   RDW 17.2* 17.2*  --   LYMPHSABS 1.1 1.0  --   MONOABS 1.0 1.1*  --   EOSABS 0.0 0.0  --   BASOSABS 0.0 0.0  --     Recent Labs  Lab 09/14/22 1915 09/15/22 2136 09/15/22 2208 09/16/22 0100 09/16/22 0700  NA 134* 136 135  --   --   K  2.9* 2.7* 2.8*  --   --   CL 94* 98 99  --   --   CO2 22 24  --   --   --   ANIONGAP 18* 14  --   --   --   GLUCOSE 123* 132* 138*  --   --   BUN _0 --   --   CREATININE 1.65* 1.74* 1.70*  --   --   AST 133* 128*  --   --   --   ALT 77* 75*  --   --   --   ALKPHOS 861* 790*  --   --   --   BILITOT 42.0* 42.0*  --   --   --   ALBUMIN 2.2* 2.1*  --   --   --   LATICACIDVEN  --  2.3*  --  2.0*  --   INR  --   --   --   --  2.2*  AMMONIA  --   --   --  55*  --   MG  --   --   --  2.2  --   CALCIUM 9.4 9.2  --   --   --     Cardiac Enzymes No results for input(s): "CKMB", "TROPONINI", "MYOGLOBIN" in the last 168 hours.  Invalid input(s): "CK"  Micro Results No results found for this or any previous visit (from the past 240 hour(s)).  Radiology Reports CT ABDOMEN PELVIS WO CONTRAST  Result Date: 09/15/2022 CLINICAL DATA:  Left-sided abdominal pain, diarrhea EXAM: CT ABDOMEN AND PELVIS WITHOUT CONTRAST TECHNIQUE: Multidetector CT imaging of the abdomen and pelvis was performed following the standard protocol without IV contrast. RADIATION DOSE REDUCTION: This exam was performed according to the departmental dose-optimization program which includes automated exposure control, adjustment of the mA and/or kV according to patient size and/or use of iterative reconstruction technique. COMPARISON:  Abdomen sonogram done earlier today, CT abdomen done on 11/27/2004. FINDINGS: Lower chest: Pacer leads are noted in place. Visualized lower lung fields are clear. Hepatobiliary: There is abnormal dilation of intrahepatic and extrahepatic bile ducts. Distal common bile duct in the head of the pancreas measures 14 mm. Gallbladder is distended. There is no wall thickening in gallbladder. There is no fluid around the gallbladder. Pancreas: No focal abnormalities are seen in pancreas. Evaluation is limited in this noncontrast study. Spleen: Unremarkable. Adrenals/Urinary Tract: Adrenals are  unremarkable. There is no hydronephrosis. Small calcifications are seen in left renal artery branches. There are no demonstrable renal or ureteral stones. Urinary bladder is not distended. Stomach/Bowel: Stomach is not distended. Small bowel loops are not dilated. Appendix is not distinctly seen. There is no pericecal inflammation. There is abnormal wall thickening in sigmoid colon measuring up to 2.4 cm in thickness. Vascular/Lymphatic: There are  calcifications in abdominal aorta and its major branches. There is aneurysmal dilation of infrarenal aorta measuring 4.6 x 4.1 cm. Evaluation of lumen is limited in this noncontrast study. There is aneurysmal dilation of right common iliac artery measuring 2.4 cm. There is aneurysmal dilation in left common iliac artery measuring 2.3 cm. There is no demonstrable retroperitoneal hematoma. No significant lymphadenopathy is seen. Reproductive: Prostate is enlarged. Other: There is no ascites or pneumoperitoneum. Umbilical hernia containing fat is seen. Musculoskeletal: Degenerative changes are noted in lumbar spine with disc space narrowing, bony spurs, facet hypertrophy and encroachment of neural foramina at multiple levels, more so at L4-L5 and L5-S1 levels. IMPRESSION: There is no evidence of intestinal obstruction or pneumoperitoneum. There is no hydronephrosis. There is abnormal dilation of intrahepatic and extrahepatic bile ducts. Distal common bile duct measures 1.4 cm. Evaluation of pancreas in the region of ampulla is limited in this noncontrast study. Findings may be related to noncalcified calculus in the distal common bile duct or stricture in the distal common bile duct. Follow-up MRI and MRCP may be considered. Distention of gallbladder may be due to stricture in distal common bile duct and possibly fasting state of the patient. There is abnormal wall thickening in sigmoid colon. Possibility of malignant neoplastic process in sigmoid colon is not excluded.  Endoscopic correlation should be considered. There is aneurysmal dilation of infrarenal abdominal aorta measuring 4.6 cm in diameter. There is aneurysmal dilation of both common iliac arteries measuring up to 2.4 cm. Recommend follow-up every 6 months and vascular consultation.Reference: J Am Coll Radiol 0109;32:[id number]. Enlarged prostate.  Lumbar spondylosis. Other findings as described in the body of the report. Electronically Signed   By: Elmer Picker M.D.   On: 09/15/2022 21:45   US Abdomen Limited RUQ (LIVER/GB)  Result Date: 09/15/2022 CLINICAL DATA:  Pain right upper quadrant EXAM: ULTRASOUND ABDOMEN LIMITED RIGHT UPPER QUADRANT COMPARISON:  None Available. FINDINGS: Gallbladder: There are no demonstrable gallbladder stones. Technologist did not observe any tenderness over the gallbladder. There are low-level echoes in the lumen suggesting possible sludge. There is no fluid around the gallbladder. Common bile duct: Diameter: Common bile duct measures 1.5 cm. There is dilation of intrahepatic bile ducts. Distal common bile duct is obscured by bowel gas. Liver: There is increased echogenicity in liver. No focal abnormalities are seen in the visualized portions of liver. Portal vein is patent on color Doppler imaging with normal direction of blood flow towards the liver. Other: None. IMPRESSION: Sludge is seen in the lumen of gallbladder. There are no demonstrable gallbladder stones or imaging signs of acute cholecystitis. There is dilation of intrahepatic and visualized proximal extrahepatic bile ducts. Proximal common bile duct measures 1.5 cm in diameter. This may suggest stricture or calculus in the distal common bile duct. Distal common bile duct is obscured by bowel gas and not evaluated. If clinically warranted, follow-up CT may be considered. Electronically Signed   By: Elmer Picker M.D.   On: 09/15/2022 18:28      Signature  -   Lala Lund M.D on 09/16/2022 at 8:20 AM   -  To  page go to [email or web address]

## 2022-09-16 NOTE — Assessment & Plan Note (Signed)
Hold diuretics, Entresto. Continue with gentle IVF.

## 2022-09-16 NOTE — Assessment & Plan Note (Signed)
Chronic. Will need MRCP in the AM.

## 2022-09-16 NOTE — Assessment & Plan Note (Signed)
Due to weight loss and dilated CBD, there is a concern for neoplastic process. Will need GI consult.

## 2022-09-17 ENCOUNTER — Encounter (HOSPITAL_COMMUNITY)
Admission: EM | Disposition: A | Discharge status: Home / Self Care | Payer: Self-pay | Source: Home / Self Care | Attending: Internal Medicine

## 2022-09-17 ENCOUNTER — Inpatient Hospital Stay (HOSPITAL_COMMUNITY): Payer: Medicare Other | Admitting: Certified Registered Nurse Anesthetist

## 2022-09-17 ENCOUNTER — Encounter (HOSPITAL_COMMUNITY): Payer: Self-pay | Admitting: Internal Medicine

## 2022-09-17 ENCOUNTER — Inpatient Hospital Stay (HOSPITAL_COMMUNITY): Payer: Medicare Other

## 2022-09-17 DIAGNOSIS — I110 Hypertensive heart disease with heart failure: Secondary | ICD-10-CM

## 2022-09-17 DIAGNOSIS — K831 Obstruction of bile duct: Secondary | ICD-10-CM

## 2022-09-17 DIAGNOSIS — I2510 Atherosclerotic heart disease of native coronary artery without angina pectoris: Secondary | ICD-10-CM

## 2022-09-17 DIAGNOSIS — K838 Other specified diseases of biliary tract: Secondary | ICD-10-CM

## 2022-09-17 DIAGNOSIS — I509 Heart failure, unspecified: Secondary | ICD-10-CM

## 2022-09-17 DIAGNOSIS — F17210 Nicotine dependence, cigarettes, uncomplicated: Secondary | ICD-10-CM

## 2022-09-17 HISTORY — PX: BILIARY BRUSHING: SHX6843

## 2022-09-17 HISTORY — PX: SPHINCTEROTOMY: SHX5544

## 2022-09-17 HISTORY — PX: ERCP: SHX5425

## 2022-09-17 HISTORY — PX: BILIARY STENT PLACEMENT: SHX5538

## 2022-09-17 LAB — CBC WITH DIFFERENTIAL/PLATELET
Abs Immature Granulocytes: 0.18 10*3/uL — ABNORMAL HIGH (ref 0.00–0.07)
Basophils Absolute: 0.1 10*3/uL (ref 0.0–0.1)
Basophils Relative: 0 %
Eosinophils Absolute: 0 10*3/uL (ref 0.0–0.5)
Eosinophils Relative: 0 %
HCT: 33.8 % — ABNORMAL LOW (ref 39.0–52.0)
Hemoglobin: 12.2 g/dL — ABNORMAL LOW (ref 13.0–17.0)
Immature Granulocytes: 1 %
Lymphocytes Relative: 8 %
Lymphs Abs: 1.1 10*3/uL (ref 0.7–4.0)
MCH: 33.4 pg (ref 26.0–34.0)
MCHC: 36.1 g/dL — ABNORMAL HIGH (ref 30.0–36.0)
MCV: 92.6 fL (ref 80.0–100.0)
Monocytes Absolute: 1 10*3/uL (ref 0.1–1.0)
Monocytes Relative: 7 %
Neutro Abs: 12.4 10*3/uL — ABNORMAL HIGH (ref 1.7–7.7)
Neutrophils Relative %: 84 %
Platelets: 328 10*3/uL (ref 150–400)
RBC: 3.65 MIL/uL — ABNORMAL LOW (ref 4.22–5.81)
RDW: 16.9 % — ABNORMAL HIGH (ref 11.5–15.5)
WBC: 14.7 10*3/uL — ABNORMAL HIGH (ref 4.0–10.5)
nRBC: 0 % (ref 0.0–0.2)

## 2022-09-17 LAB — COMPREHENSIVE METABOLIC PANEL
ALT: 64 U/L — ABNORMAL HIGH (ref 0–44)
AST: 109 U/L — ABNORMAL HIGH (ref 15–41)
Albumin: 1.7 g/dL — ABNORMAL LOW (ref 3.5–5.0)
Alkaline Phosphatase: 634 U/L — ABNORMAL HIGH (ref 38–126)
Anion gap: 9 (ref 5–15)
BUN: 11 mg/dL (ref 8–23)
CO2: 24 mmol/L (ref 22–32)
Calcium: 8.8 mg/dL — ABNORMAL LOW (ref 8.9–10.3)
Chloride: 103 mmol/L (ref 98–111)
Creatinine, Ser: 1.3 mg/dL — ABNORMAL HIGH (ref 0.61–1.24)
GFR, Estimated: >60 mL/min (ref >60)
Glucose, Bld: 80 mg/dL (ref 70–99)
Potassium: 2.8 mmol/L — ABNORMAL LOW (ref 3.5–5.1)
Sodium: 136 mmol/L (ref 135–145)
Total Bilirubin: 33.5 mg/dL (ref 0.3–1.2)
Total Protein: 5.4 g/dL — ABNORMAL LOW (ref 6.5–8.1)

## 2022-09-17 LAB — MISC LABCORP TEST (SEND OUT): Labcorp test code: 6627

## 2022-09-17 LAB — HAPTOGLOBIN
Haptoglobin: 181 mg/dL (ref 32–363)
Haptoglobin: 153 mg/dL (ref 32–363)

## 2022-09-17 LAB — POCT I-STAT, CHEM 8
BUN: 12 mg/dL (ref 8–23)
Calcium, Ion: 1.01 mmol/L — ABNORMAL LOW (ref 1.15–1.40)
Chloride: 102 mmol/L (ref 98–111)
Creatinine, Ser: 1 mg/dL (ref 0.61–1.24)
Glucose, Bld: 98 mg/dL (ref 70–99)
HCT: 41 % (ref 39.0–52.0)
Hemoglobin: 13.9 g/dL (ref 13.0–17.0)
Potassium: 3.2 mmol/L — ABNORMAL LOW (ref 3.5–5.1)
Sodium: 137 mmol/L (ref 135–145)
TCO2: 24 mmol/L (ref 22–32)

## 2022-09-17 LAB — HEPATITIS PANEL, ACUTE
HCV Ab: NONREACTIVE
Hep A IgM: NONREACTIVE
Hep B C IgM: REACTIVE — AB
Hepatitis B Surface Ag: NONREACTIVE

## 2022-09-17 LAB — BRAIN NATRIURETIC PEPTIDE: B Natriuretic Peptide: 391.9 pg/mL — ABNORMAL HIGH (ref 0.0–100.0)

## 2022-09-17 LAB — MAGNESIUM: Magnesium: 2.2 mg/dL (ref 1.7–2.4)

## 2022-09-17 LAB — C-REACTIVE PROTEIN: CRP: 22.4 mg/dL — ABNORMAL HIGH (ref <1.0)

## 2022-09-17 SURGERY — ERCP, WITH INTERVENTION IF INDICATED
Anesthesia: General

## 2022-09-17 MED ORDER — PHENYLEPHRINE HCL-NACL 20-0.9 MG/250ML-% IV SOLN
INTRAVENOUS | Status: DC | PRN
  Administered 2022-09-17: 60 ug/min via INTRAVENOUS

## 2022-09-17 MED ORDER — SODIUM CHLORIDE 0.9 % IV SOLN
INTRAVENOUS | Status: DC | PRN
  Administered 2022-09-17: 40 mL

## 2022-09-17 MED ORDER — ROCURONIUM BROMIDE 10 MG/ML (PF) SYRINGE
PREFILLED_SYRINGE | INTRAVENOUS | Status: DC | PRN
  Administered 2022-09-17: 20 mg via INTRAVENOUS
  Administered 2022-09-17: 40 mg via INTRAVENOUS

## 2022-09-17 MED ORDER — DEXAMETHASONE SODIUM PHOSPHATE 10 MG/ML IJ SOLN
INTRAMUSCULAR | Status: DC | PRN
  Administered 2022-09-17: 5 mg via INTRAVENOUS

## 2022-09-17 MED ORDER — LACTATED RINGERS IV BOLUS
250.0000 mL | Freq: Once | INTRAVENOUS | Status: AC
  Administered 2022-09-17: 250 mL via INTRAVENOUS

## 2022-09-17 MED ORDER — SODIUM CHLORIDE 0.9 % IV SOLN
3.0000 g | Freq: Once | INTRAVENOUS | Status: AC
  Administered 2022-09-17: 3 g via INTRAVENOUS
  Filled 2022-09-17: qty 8

## 2022-09-17 MED ORDER — SODIUM CHLORIDE 0.9 % IV SOLN: INTRAVENOUS | Status: DC

## 2022-09-17 MED ORDER — CIPROFLOXACIN IN D5W 400 MG/200ML IV SOLN
INTRAVENOUS | Status: AC
  Filled 2022-09-17: qty 200

## 2022-09-17 MED ORDER — GLUCAGON HCL RDNA (DIAGNOSTIC) 1 MG IJ SOLR
INTRAMUSCULAR | Status: AC
  Filled 2022-09-17: qty 1

## 2022-09-17 MED ORDER — SUGAMMADEX SODIUM 200 MG/2ML IV SOLN
INTRAVENOUS | Status: DC | PRN
  Administered 2022-09-17: 130 mg via INTRAVENOUS

## 2022-09-17 MED ORDER — KCL-LACTATED RINGERS-D5W 20 MEQ/L IV SOLN
INTRAVENOUS | Status: DC
  Filled 2022-09-17: qty 1000

## 2022-09-17 MED ORDER — LACTATED RINGERS IV BOLUS
500.0000 mL | Freq: Once | INTRAVENOUS | Status: AC
  Administered 2022-09-17: 500 mL via INTRAVENOUS

## 2022-09-17 MED ORDER — PHENYLEPHRINE 80 MCG/ML (10ML) SYRINGE FOR IV PUSH (FOR BLOOD PRESSURE SUPPORT)
PREFILLED_SYRINGE | INTRAVENOUS | Status: DC | PRN
  Administered 2022-09-17: 160 ug via INTRAVENOUS
  Administered 2022-09-17: 80 ug via INTRAVENOUS

## 2022-09-17 MED ORDER — DICLOFENAC SUPPOSITORY 100 MG
RECTAL | Status: DC | PRN
  Administered 2022-09-17: 100 mg via RECTAL

## 2022-09-17 MED ORDER — POTASSIUM CHLORIDE 20 MEQ PO PACK
40.0000 meq | PACK | Freq: Two times a day (BID) | ORAL | Status: DC
  Administered 2022-09-17: 40 meq via ORAL
  Filled 2022-09-17 (×2): qty 2

## 2022-09-17 MED ORDER — DICLOFENAC SUPPOSITORY 100 MG
RECTAL | Status: AC
  Filled 2022-09-17: qty 1

## 2022-09-17 MED ORDER — DICLOFENAC SUPPOSITORY 100 MG: 100.0000 mg | Freq: Once | RECTAL | Status: DC

## 2022-09-17 MED ORDER — VASOPRESSIN 20 UNIT/ML IV SOLN
INTRAVENOUS | Status: DC | PRN
  Administered 2022-09-17 (×2): 1 [IU] via INTRAVENOUS

## 2022-09-17 MED ORDER — GLUCAGON HCL RDNA (DIAGNOSTIC) 1 MG IJ SOLR
INTRAMUSCULAR | Status: DC | PRN
  Administered 2022-09-17 (×3): .25 mg via INTRAVENOUS

## 2022-09-17 MED ORDER — POTASSIUM CHLORIDE 10 MEQ/100ML IV SOLN
10.0000 meq | INTRAVENOUS | Status: AC
  Administered 2022-09-17 (×3): 10 meq via INTRAVENOUS
  Filled 2022-09-17 (×3): qty 100

## 2022-09-17 MED ORDER — ONDANSETRON HCL 4 MG/2ML IJ SOLN
INTRAMUSCULAR | Status: DC | PRN
  Administered 2022-09-17: 4 mg via INTRAVENOUS

## 2022-09-17 MED ORDER — LIDOCAINE 2% (20 MG/ML) 5 ML SYRINGE
INTRAMUSCULAR | Status: DC | PRN
  Administered 2022-09-17: 60 mg via INTRAVENOUS

## 2022-09-17 MED ORDER — POTASSIUM CHLORIDE 10 MEQ/100ML IV SOLN
INTRAVENOUS | Status: DC | PRN
  Administered 2022-09-17: 10 meq via INTRAVENOUS

## 2022-09-17 MED ORDER — DEXTROSE-NACL 5-0.45 % IV SOLN: INTRAVENOUS | Status: DC

## 2022-09-17 MED ORDER — POTASSIUM CHLORIDE CRYS ER 20 MEQ PO TBCR
40.0000 meq | EXTENDED_RELEASE_TABLET | Freq: Two times a day (BID) | ORAL | Status: DC
  Administered 2022-09-17: 40 meq via ORAL
  Filled 2022-09-17 (×2): qty 2

## 2022-09-17 MED ORDER — ETOMIDATE 2 MG/ML IV SOLN
INTRAVENOUS | Status: DC | PRN
  Administered 2022-09-17: 50 mg via INTRAVENOUS

## 2022-09-17 MED ORDER — VITAMIN K1 10 MG/ML IJ SOLN
2.0000 mg | Freq: Once | INTRAVENOUS | Status: AC
  Administered 2022-09-17: 2 mg via INTRAVENOUS
  Filled 2022-09-17: qty 0.2

## 2022-09-17 MED ORDER — POTASSIUM CHLORIDE 10 MEQ/100ML IV SOLN
10.0000 meq | Freq: Once | INTRAVENOUS | Status: AC
  Administered 2022-09-17: 10 meq via INTRAVENOUS
  Filled 2022-09-17: qty 100

## 2022-09-17 MED ORDER — LACTATED RINGERS IV SOLN
INTRAVENOUS | Status: AC | PRN
  Administered 2022-09-17: 10 mL/h via INTRAVENOUS

## 2022-09-17 NOTE — Progress Notes (Signed)
OT Cancellation Note  Patient Details Name: [name] [name] MRN: [id number] DOB: [birth year]   Cancelled Treatment:    Reason Eval/Treat Not Completed: Patient at procedure or test/ unavailable (Pt unavailable for OT evaluation. ENDOSCOPIC RETROGRADE CHOLANGIOPANCREATOGRAPHY (ERCP) being performed this afternoon. Will re-attempt evaluation when pt is able to participate.)  Ailene Ravel, OTR/L,CBIS  Supplemental OT - [facility]  09/17/2022, 1:02 PM

## 2022-09-17 NOTE — Progress Notes (Signed)
Notified by lab that stool sample that was sent yesterday afternoon was not enough. Pt instructed to notify RN when another sample is ready. He verbalizes understanding and is agreeable to plan. Hat provided in toilet.

## 2022-09-17 NOTE — ED Provider Notes (Signed)
[name]    CSN: [id number] Arrival date & time: 09/14/22  1742      History   Chief Complaint Chief Complaint  Patient presents with   Diarrhea   Weakness    HPI [name] [name] is a 66 y.o. male.   Patient presents to urgent care with his adult daughter who contributes to history for evaluation of fatigue, diarrhea, and generalized weakness for the last 2 weeks. Daughter states her dad "hasn't been himself" for the last week and has been getting weaker and weaker. Reports multiple non-bloody/mucous episodes of diarrhea per day for the last 2 weeks. Appetite has decreased over the last 2 weeks as well. Tolerating fluids and food without nausea/vomiting. Reports left lower quadrant intermittent abdominal pain that started last week. Laying down makes abdominal pain worse, cannot identify any other relieving or triggering factors for abdominal pain. No recent antibiotic use, dietary changes, or intake of raw/undercooked meats. Daughter states patient's eyes have started to appear yellow in color over the last week. He denies recent alcohol intake, shortness of breath, cough, nasal congestion, sore throat, GERD symptoms, and dizziness. No recent falls or trauma to the abdomen. No recent medication changes. Has an appointment with new PCP on 09/22/22 per daughter. Currently smokes 1 pack of cigarettes per day and has for many years. Has not attempted use of any OTC medications for symptoms.    Diarrhea Weakness Associated symptoms: diarrhea     Past Medical History:  Diagnosis Date   AICD (automatic cardioverter/defibrillator) present    Chronic systolic CHF (congestive heart failure) ([place]) 01/31/2015   Coronary artery disease    Hypertension    Nonischemic cardiomyopathy ([place]) 06/30/2015    Patient Active Problem List   Diagnosis Date Noted   Hyperbilirubinemia 09/16/2022   Hypokalemia 09/16/2022   Common bile duct dilatation 09/16/2022   ICD (implantable  cardioverter-defibrillator) in place 09/16/2022   AKI (acute kidney injury) ([place]) 09/16/2022   NICM (nonischemic cardiomyopathy) ([place]) 07/09/2015   Nonischemic cardiomyopathy ([place]) 06/30/2015   Heavy smoker (more than 20 cigarettes per day) 05/27/2015   PAD (peripheral artery disease) ([place]) 62/13/0865   Chronic systolic CHF (congestive heart failure) ([place]) 01/31/2015   Pedal edema 01/27/2015   Blood in stool 11/06/2013   Polycythemia, secondary 06/18/2013   Nicotine dependence 06/18/2013   Essential hypertension, benign 06/18/2013   Dyslipidemia 06/18/2013   Low back pain 06/18/2013   Irregular heart rhythm 06/18/2013    Past Surgical History:  Procedure Laterality Date   CARDIAC CATHETERIZATION N/A 02/04/2015   Procedure: Right/Left Heart Cath and Coronary Angiography;  Surgeon: Larey Dresser, MD;  Location: [facility];  Service: Cardiovascular;  Laterality: N/A;   CARDIAC DEFIBRILLATOR PLACEMENT  07/09/2015   CHALAZION EXCISION Left 01/16/2014   Procedure: MINOR EXCISION OF CHALAZION;  Surgeon: Myrtha Mantis., MD;  Location: [facility];  Service: Ophthalmology;  Laterality: Left;   EP IMPLANTABLE DEVICE N/A 07/09/2015   Procedure: ICD Implant;  Surgeon: Deboraha Sprang, MD;  Location: [facility];  Service: Cardiovascular;  Laterality: N/A;   EYE SURGERY Left ~ 2013   "eye infection"       Home Medications    Prior to Admission medications   Medication Sig Start Date End Date Taking? Authorizing Provider  aspirin 81 MG EC tablet TAKE 1 TABLET(81 MG) BY MOUTH DAILY. SWALLOW WHOLE Patient taking differently: Take 81 mg by mouth daily. TAKE 1 TABLET(81 MG) BY MOUTH DAILY. SWALLOW  WHOLE 11/04/21   Larey Dresser, MD  carvedilol (COREG) 25 MG tablet Take 1 tablet (25 mg total) by mouth 2 (two) times daily with a meal. 11/04/21   Larey Dresser, MD  dapagliflozin propanediol (FARXIGA) 10 MG TABS tablet Take 1 tablet (10 mg total) by mouth  daily before breakfast. 11/04/21   Larey Dresser, MD  digoxin (LANOXIN) 0.125 MG tablet Take 1 tablet (0.125 mg total) by mouth daily. 11/04/21   Larey Dresser, MD  ezetimibe (ZETIA) 10 MG tablet TAKE 1 TABLET(10 MG) BY MOUTH DAILY Patient taking differently: Take 10 mg by mouth daily. 11/04/21   Larey Dresser, MD  isosorbide-hydrALAZINE (BIDIL) 20-37.5 MG tablet Take 2 tablets by mouth 3 (three) times daily. Patient taking differently: Take 1 tablet by mouth in the morning and at bedtime. 11/04/21   Larey Dresser, MD  metFORMIN (GLUCOPHAGE XR) 500 MG 24 hr tablet Take 1 tablet (500 mg total) by mouth daily with breakfast. 12/20/17   Clent Demark, PA-C  rosuvastatin (CRESTOR) 40 MG tablet TAKE 1 TABLET(40 MG) BY MOUTH DAILY Patient taking differently: Take 40 mg by mouth daily. 11/04/21   Larey Dresser, MD  sacubitril-valsartan (ENTRESTO) 97-103 MG Take 1 tablet by mouth 2 (two) times daily. 11/04/21   Larey Dresser, MD  spironolactone (ALDACTONE) 50 MG tablet Take 1 tablet (50 mg total) by mouth daily. 02/16/22   Larey Dresser, MD  triamcinolone ointment (KENALOG) 0.5 % Apply 1 application topically 2 (two) times daily. 07/11/18   Clent Demark, PA-C    Family History Family History  Problem Relation Age of Onset   Stroke Mother    Heart Problems Other     Social History Social History   Tobacco Use   Smoking status: Every Day    Packs/day: 1.00    Years: 40.00    Total pack years: 40.00    Types: Cigarettes   Smokeless tobacco: Never  Substance Use Topics   Alcohol use: Not Currently    Comment: 07/09/2015 "I haven't had a drink since last of 12/2014"   Drug use: Not Currently    Types: "Crack" cocaine, Marijuana    Comment: 07/09/2015 "I did drugs a long time;ago  quit in the mid 1980's"     Allergies   Patient has no known allergies.   Review of Systems Review of Systems  Gastrointestinal:  Positive for diarrhea.  Neurological:  Positive for  weakness.  Per HPI   Physical Exam Triage Vital Signs ED Triage Vitals  Enc Vitals Group     BP 09/14/22 1825 (!) 145/103     Pulse Rate 09/14/22 1825 90     Resp 09/14/22 1825 18     Temp 09/14/22 1825 97.9 F (36.6 C)     Temp Source 09/14/22 1825 Oral     SpO2 09/14/22 1825 95 %     Weight --      Height --      Head Circumference --      Peak Flow --      Pain Score 09/14/22 1826 5     Pain Loc --      Pain Edu? --      Excl. in [place]? --    No data found.  Updated Vital Signs BP (!) 145/103 (BP Location: Left Arm)   Pulse 90   Temp 97.9 F (36.6 C) (Oral)   Resp 18   SpO2 95%   Visual  Acuity Right Eye Distance:   Left Eye Distance:   Bilateral Distance:    Right Eye Near:   Left Eye Near:    Bilateral Near:     Physical Exam Vitals and nursing note reviewed.  Constitutional:      Appearance: He is ill-appearing. He is not toxic-appearing.  HENT:     Head: Normocephalic and atraumatic.     Right Ear: Hearing, tympanic membrane, ear canal and external ear normal.     Left Ear: Hearing, tympanic membrane, ear canal and external ear normal.     Nose: Nose normal.     Mouth/Throat:     Lips: Pink.     Mouth: Mucous membranes are moist.     Tongue: No lesions.     Palate: Lesions present. No mass.     Pharynx: Oropharynx is clear. Uvula midline. No posterior oropharyngeal erythema.     Tonsils: No tonsillar exudate or tonsillar abscesses.     Comments: Diffuse purulent lesions to the hard palate. No posterior oropharynx erythema or tonsillar swelling.  Eyes:     General: Lids are normal. Vision grossly intact. Gaze aligned appropriately.        Right eye: No discharge.        Left eye: No discharge.     Extraocular Movements: Extraocular movements intact.     Pupils: Pupils are equal, round, and reactive to light.     Comments: Mild scleral icterus bilaterally.  Cardiovascular:     Rate and Rhythm: Normal rate and regular rhythm.     Heart sounds:  Normal heart sounds, S1 normal and S2 normal.  Pulmonary:     Effort: Pulmonary effort is normal. No respiratory distress.     Breath sounds: Normal breath sounds and air entry. No decreased breath sounds, wheezing, rhonchi or rales.  Abdominal:     General: Abdomen is flat. Bowel sounds are normal. There is no distension.     Palpations: Abdomen is soft. There is no mass.     Tenderness: There is generalized abdominal tenderness. There is no right CVA tenderness, left CVA tenderness, guarding or rebound.     Comments: No peritoneal signs.  Musculoskeletal:     Cervical back: Neck supple.  Lymphadenopathy:     Cervical: No cervical adenopathy.  Skin:    General: Skin is warm and dry.     Capillary Refill: Capillary refill takes less than 2 seconds.     Findings: No rash.  Neurological:     General: No focal deficit present.     Mental Status: He is alert and oriented to person, place, and time. Mental status is at baseline.     Cranial Nerves: No dysarthria or facial asymmetry.  Psychiatric:        Mood and Affect: Mood normal.        Speech: Speech normal.        Behavior: Behavior normal.        Thought Content: Thought content normal.        Judgment: Judgment normal.      UC Treatments / Results  Labs (all labs ordered are listed, but only abnormal results are displayed)   EKG   Radiology   Procedures Procedures (including critical care time)  Medications Ordered in UC Medications  acetaminophen (TYLENOL) tablet 650 mg (650 mg Oral Given 09/14/22 1920)    Initial Impression / Assessment and Plan / UC Course  I have reviewed the triage vital signs and the  nursing notes.  Pertinent labs & imaging results that were available during my care of the patient were reviewed by me and considered in my medical decision making (see chart for details).   1. Diarrhea, dehydration, generalized weakness Patient is rather ill-appearing, however nontoxic in appearance.  Vitals are hemodynamically stable. Abdominal exam is without peritoneal signs, significant for diffuse tenderness. CBC with diff, CMP, and lipase sent to lab for evaluation. Patient and daughter prefer not to go to the ER due to long wait time. Discussed case with Dr. Windy Carina who also evaluated patient. We will defer ER referral at this time and wait until labs come back tomorrow morning. If patient should become any worse overnight, he is to go to the ER. Daughter and patient express agreement with plan. Will call patient/daughter with results if necessary/abnormal tomorrow morning. Until then, future order for GI panel placed. Patient to provide stool sample and bring this back to clinic tomorrow or the next day for evaluation. Tylenol '650mg'$  given for abdominal pain. Strict ED return precautions discussed as stated above, patient and daughter express agreement with plan.  Discussed physical exam and available lab work findings in clinic with patient.  Counseled patient regarding appropriate use of medications and potential side effects for all medications recommended or prescribed today. Discussed red flag signs and symptoms of worsening condition,when to call the PCP office, return to urgent care, and when to seek higher level of care in the emergency department. Patient verbalizes understanding and agreement with plan. All questions answered. Patient discharged in stable condition.    Final Clinical Impressions(s) / UC Diagnoses   Final diagnoses:  Diarrhea, unspecified type  Dehydration  Generalized weakness     Discharge Instructions      It's unclear what is causing your diarrhea.  I would like for you to bring back a stool sample to the clinic so that we can see if there is any abnormal bacteria in your poop.   We drew blood work in the clinic.  You may hear from [place] tomorrow if any of your blood work is abnormal requiring a visit to the ER.  If you become more weak, fall, passout, or  develop any new or worsening symptoms, I would like for you to go to the nearest emergency department for further evaluation.  Otherwise, follow-up with PCP as scheduled on September 22, 2022.    ED Prescriptions   None    PDMP not reviewed this encounter.   Talbot Grumbling, [place] 09/17/22 1031

## 2022-09-17 NOTE — Op Note (Signed)
[facility] Patient Name: [name] [name] Procedure Date : 09/17/2022 MRN: [id number] Attending MD: Jackquline Denmark , MD, [id number] Date of Birth: 07-08-1956 CSN: [id number] Age: 66 Admit Type: Outpatient Procedure:                ERCP Indications:              Jaundice, Elevated liver enzymes Providers:                Jackquline Denmark, MD, Gabriel Earing, RN, Cletis Athens, Technician Referring MD:              Medicines:                General Anesthesia, Unasyn 3 g IV, Indomethacin 100                            mg PR, Glucagon '1mg'$ . Complications:            No immediate complications. Estimated Blood Loss:     Estimated blood loss: none. Procedure:                Pre-Anesthesia Assessment:                           - Prior to the procedure, a History and Physical                            was performed, and patient medications and                            allergies were reviewed. The patient's tolerance of                            previous anesthesia was also reviewed. The risks                            and benefits of the procedure and the sedation                            options and risks were discussed with the patient.                            All questions were answered, and informed consent                            was obtained. Prior Anticoagulants: The patient has                            taken heparin, last dose was 1 day prior to                            procedure. ASA Grade Assessment: IV - A patient  with severe systemic disease that is a constant                            threat to life. After reviewing the risks and                            benefits, the patient was deemed in satisfactory                            condition to undergo the procedure.                           After obtaining informed consent, the scope was                            passed under direct vision.  Throughout the                            procedure, the patient's blood pressure, pulse, and                            oxygen saturations were monitored continuously. The                            [id number]-Q190V ([id number]) Olympus duodenoscope was                            introduced through the mouth, and used to inject                            contrast into and used to inject contrast into the                            bile duct. The ERCP was performed with moderate                            difficulty due to challenging cannulation because                            of abnormal anatomy. The patient tolerated the                            procedure well. Scope In: Scope Out: Findings:      The scout film was normal. The esophagus was successfully intubated       under direct vision. The scope was advanced to a normal major papilla in       the descending duodenum without detailed examination of the pharynx,       larynx and associated structures, and upper GI tract. The upper GI tract       was grossly normal. J shaped stomach.      Distorted anatomy d/t tight distal CBD stricture. The bile duct was       deeply cannulated with the short-nosed traction sphincterotome with       revolution wire. Contrast  was injected. I personally interpreted the       bile duct images. Ductal flow of contrast was adequate. Image quality       was adequate. Contrast extended to the entire biliary tree. The lower       third of the main bile duct contained a single severe stenosis 15 mm in       length. The entire biliary tree was diffusely dilated. The largest       diameter was 15 mm. Cells for cytology were obtained by brushing in the       lower third of the main bile duct. A 6 mm biliary sphincterotomy was       made with a monofilament traction (standard) sphincterotome using ERBE       electrocautery. There was no post-sphincterotomy bleeding. One 10 Fr by       7 cm plastic stent was placed 6  cm into the common bile duct. Thick Bile       flowed through the stent. The stent was in good position.      Wire did find pancreatic duct in first few attempts. Diclofenac       suppostories were given. Impression:               - A single severe biliary stricture was found in                            the lower third of the main bile duct. The                            stricture was malignant appearing.                           - The entire biliary tree was dilated.                           - Cells for cytology obtained in the lower third of                            the main duct.                           - A biliary sphincterotomy was performed.                           - One plastic stent was placed into the common bile                            duct. Recommendation:           - Clear liquid diet.                           - Return patient to hospital ward for ongoing care.                           - Follow cytology                           - Check  CA19-9, CEA.                           - Trend LFTs.                           - Resume SQ hepatin in 48 hrs.                           - Likely will need EUS as out-pt.                           - Watch for pancreatitis, bleeding, perforation,                            and cholangitis.                           - The findings and recommendations were discussed                            with the patient's family. Procedure Code(s):        --- Professional ---                           [phone], Endoscopic retrograde                            cholangiopancreatography (ERCP); with placement of                            endoscopic stent into biliary or pancreatic duct,                            including pre- and post-dilation and guide wire                            passage, when performed, including sphincterotomy,                            when performed, each stent                           [id number], Endoscopic  catheterization of the biliary                            ductal system, radiological supervision and                            interpretation Diagnosis Code(s):        --- Professional ---                           K83.1, Obstruction of bile duct                           R17, Unspecified jaundice  R74.8, Abnormal levels of other serum enzymes                           K83.8, Other specified diseases of biliary tract CPT copyright 2022 American Medical Association. All rights reserved. The codes documented in this report are preliminary and upon coder review may  be revised to meet current compliance requirements. Jackquline Denmark, MD 09/17/2022 3:39:16 PM This report has been signed electronically. Number of Addenda: 0

## 2022-09-17 NOTE — Anesthesia Preprocedure Evaluation (Signed)
Anesthesia Evaluation  Patient identified by MRN, date of birth, ID band Patient awake    Reviewed: Allergy & Precautions, NPO status , Patient's Chart, lab work & pertinent test results  Airway Mallampati: II  TM Distance: >3 FB Neck ROM: Full    Dental  (+) Dental Advisory Given   Pulmonary Current Smoker   breath sounds clear to auscultation       Cardiovascular hypertension, Pt. on medications and Pt. on home beta blockers + CAD, + Peripheral Vascular Disease and +CHF  + Cardiac Defibrillator  Rhythm:Regular Rate:Normal     Neuro/Psych negative neurological ROS     GI/Hepatic negative GI ROS, Neg liver ROS,,,  Endo/Other  negative endocrine ROS    Renal/GU ARFRenal disease     Musculoskeletal   Abdominal   Peds  Hematology  (+) Blood dyscrasia, anemia   Anesthesia Other Findings   Reproductive/Obstetrics                             Anesthesia Physical Anesthesia Plan  ASA: 4  Anesthesia Plan: General   Post-op Pain Management: Minimal or no pain anticipated   Induction: Intravenous  PONV Risk Score and Plan: 1 and Dexamethasone, Ondansetron and Treatment may vary due to age or medical condition  Airway Management Planned: Oral ETT  Additional Equipment: ClearSight  Intra-op Plan:   Post-operative Plan: Extubation in OR  Informed Consent: I have reviewed the patients History and Physical, chart, labs and discussed the procedure including the risks, benefits and alternatives for the proposed anesthesia with the patient or authorized representative who has indicated his/her understanding and acceptance.     Dental advisory given  Plan Discussed with: CRNA  Anesthesia Plan Comments:        Anesthesia Quick Evaluation

## 2022-09-17 NOTE — Progress Notes (Signed)
PROGRESS NOTE                                                                                                                                                                                                             Patient Demographics:    [name] [name], is a 66 y.o. male, DOB - 02-22-1956, XBJ:[id number]  Outpatient Primary MD for the patient is Pcp, No    LOS - 1  Admit date - 09/15/2022    Chief Complaint  Patient presents with   Abnormal Lab       Brief Narrative (HPI from H&P)   66 year old African-American male history of nonischemic cardiomyopathy status post ICD placement, chronic systolic heart failure with an EF of 25%, chronic tobacco abuse, hypertension who presents to the ER from urgent care due to abnormal labs. He was seen in urgent care on the 19th due to 2 weeks of diarrhea. He had lab was drawn in urgent care. It revealed a sodium of 134, potassium 2.9, creatinine 1.65, bilirubin of 42, alk phos of 86.    Subjective:   Patient in bed, appears comfortable, denies any headache, no fever, no chest pain or pressure, no shortness of breath , no abdominal pain. No focal weakness.   Assessment  & Plan :    Hyperbilirubinemia, severe jaundice with unintentional weight loss.  Imaging of the abdomen pelvis suggestive of CBD stricture/stone along with sigmoid colon thickening, patient does not have pain, unfortunately he has AICD which is not MRI compatible.  GI will be consulted.  Question if he has cholangiocarcinoma.  Will defer workup to GI, likely ERCP on 09/17/2022.  AKI (acute kidney injury) ([place])  Hold diuretics, Entresto. Continue with gentle IVF.  Hypokalemia - replaced IV and oral stable magnesium.  Nonischemic cardiomyopathy with chronic systolic heart failure EF 25% on recent echo, s/p AICD in place.  Currently severely dehydrated.  Beta-blocker, ACE/ARB, Entresto and diuretics held.  Being  hydrated with IV fluids.  Essential hypertension, benign  Hold HTN meds for now due to low BP.        Condition - Extremely Guarded  Family Communication  :  daughter Elmo [name] [phone]   on 09/16/22  Code Status :  Full  Consults  :  GI  PUD Prophylaxis : PPI   Procedures  :  CT - There is no evidence of intestinal obstruction or pneumoperitoneum. There is no hydronephrosis. There is abnormal dilation of intrahepatic and extrahepatic bile ducts. Distal common bile duct measures 1.4 cm. Evaluation of pancreas in the region of ampulla is limited in this noncontrast study. Findings may be related to noncalcified calculus in the distal common bile duct or stricture in the distal common bile duct. Follow-up MRI and MRCP may be considered. Distention of gallbladder may be due to stricture in distal common bile duct and possibly fasting state of the patient. There is abnormal wall thickening in sigmoid colon. Possibility of malignant neoplastic process in sigmoid colon is not excluded. Endoscopic correlation should be considered. There is aneurysmal dilation of infrarenal abdominal aorta measuring 4.6 cm in diameter. There is aneurysmal dilation of both common iliac arteries measuring up to 2.4 cm. Recommend follow-up every 6 months and vascular consultation.  [place] - Sludge is seen in the lumen of gallbladder. There are no demonstrable gallbladder stones or imaging signs of acute cholecystitis. There is dilation of intrahepatic and visualized proximal extrahepatic bile ducts. Proximal common bile duct measures 1.5 cm in diameter. This may suggest stricture or calculus in the distal common bile duct. Distal common bile duct is obscured by bowel gas and not evaluated. If clinically warranted, follow-up CT may be considered.      Disposition Plan  :    Status is: Inpatient   DVT Prophylaxis  :    SCDs Start: 09/16/22 0505    Lab Results  Component Value Date   PLT 328 09/17/2022     Diet :  Diet Order             Diet NPO time specified  Diet effective midnight                    Inpatient Medications  Scheduled Meds:  carvedilol  3.125 mg Oral BID WC   digoxin  0.125 mg Oral Daily   isosorbide mononitrate  15 mg Oral Daily   lactulose  20 g Oral BID   pantoprazole  40 mg Oral Daily   potassium chloride  40 mEq Oral BID   Continuous Infusions:  dextrose 5 % and 0.45% NaCl     lactated ringers     potassium chloride     PRN Meds:.melatonin, ondansetron **OR** ondansetron (ZOFRAN) IV  Antibiotics  :    Anti-infectives (From admission, onward)    None        Objective:   Vitals:   09/16/22 1744 09/16/22 1945 09/17/22 0641 09/17/22 0753  BP: 101/63 114/74 (!) 85/65 (!) 94/56  Pulse: 79 76 79 81  Resp: _0 Temp: (!) 97.4 F (36.3 C) 98.4 F (36.9 C) 97.8 F (36.6 C) 98.2 F (36.8 C)  TempSrc: Oral Oral Oral Oral  SpO2: 98% 98% 100% 100%    Wt Readings from Last 3 Encounters:  02/16/22 66.7 kg  12/14/21 66.9 kg  11/23/21 67.8 kg    No intake or output data in the 24 hours ending 09/17/22 0840    Physical Exam  Frail elderly African-American gentleman severely jaundiced, awake Alert, No new F.N deficits, Normal affect [place].AT, severe scleral icterus Supple Neck, No JVD,   Symmetrical Chest wall movement, Good air movement bilaterally, CTAB RRR,No Gallops,Rubs or new Murmurs,  +ve B.Sounds, Abd Soft, No tenderness,   No Cyanosis, Clubbing or edema       Data Review:    Recent  Labs  Lab 09/14/22 1915 09/15/22 2136 09/15/22 2208 09/17/22 0633  WBC 16.7* 18.7*  --  14.7*  HGB 14.6 13.9 15.6 12.2*  HCT 40.3 40.4 46.0 33.8*  PLT 421* 377  --  328  MCV 92.6 95.7  --  92.6  MCH 33.6 32.9  --  33.4  MCHC 36.2* 34.4  --  36.1*  RDW 17.2* 17.2*  --  16.9*  LYMPHSABS 1.1 1.0  --  1.1  MONOABS 1.0 1.1*  --  1.0  EOSABS 0.0 0.0  --  0.0  BASOSABS 0.0 0.0  --  0.1    Recent Labs  Lab 09/14/22 1915  09/15/22 2136 09/15/22 2208 09/16/22 0100 09/16/22 0647 09/16/22 0700 09/16/22 1344 09/17/22 0633  NA 134* 136 135  --   --   --  136 136  K 2.9* 2.7* 2.8*  --   --   --  3.1* 2.8*  CL 94* 98 99  --   --   --  103 103  CO2 22 24  --   --   --   --  25 24  ANIONGAP 18* 14  --   --   --   --  8 9  GLUCOSE 123* 132* 138*  --   --   --  103* 80  BUN _0 --   --   --  13 11  CREATININE 1.65* 1.74* 1.70*  --   --   --  1.25* 1.30*  AST 133* 128*  --   --   --   --   --  109*  ALT 77* 75*  --   --   --   --   --  64*  ALKPHOS 861* 790*  --   --   --   --   --  634*  BILITOT 42.0* 42.0*  --   --   --   --   --  33.5*  ALBUMIN 2.2* 2.1*  --   --   --   --   --  1.7*  CRP  --   --   --   --   --   --   --  22.4*  LATICACIDVEN  --  2.3*  --  2.0*  --   --   --   --   INR  --   --   --   --   --  2.2*  --   --   AMMONIA  --   --   --  55*  --   --   --   --   BNP  --   --   --   --  215.3*  --   --  391.9*  MG  --   --   --  2.2  --   --   --  2.2  CALCIUM 9.4 9.2  --   --   --   --  9.0 8.8*    Cardiac Enzymes No results for input(s): "CKMB", "TROPONINI", "MYOGLOBIN" in the last 168 hours.  Invalid input(s): "CK"  Micro Results Recent Results (from the past 240 hour(s))  C Difficile Quick Screen w PCR reflex     Status: None   Collection Time: 09/16/22  1:56 PM   Specimen: Stool  Result Value Ref Range Status   C Diff antigen NEGATIVE NEGATIVE Final   C Diff toxin NEGATIVE NEGATIVE Final   C Diff interpretation No  C. difficile detected.  Final    Comment: Performed at [facility], [place] [address] [place], [place] [address]    Radiology Reports CT ABDOMEN PELVIS WO CONTRAST  Result Date: 09/15/2022 CLINICAL DATA:  Left-sided abdominal pain, diarrhea EXAM: CT ABDOMEN AND PELVIS WITHOUT CONTRAST TECHNIQUE: Multidetector CT imaging of the abdomen and pelvis was performed following the standard protocol without IV contrast. RADIATION DOSE REDUCTION: This exam was  performed according to the departmental dose-optimization program which includes automated exposure control, adjustment of the mA and/or kV according to patient size and/or use of iterative reconstruction technique. COMPARISON:  Abdomen sonogram done earlier today, CT abdomen done on 11/27/2004. FINDINGS: Lower chest: Pacer leads are noted in place. Visualized lower lung fields are clear. Hepatobiliary: There is abnormal dilation of intrahepatic and extrahepatic bile ducts. Distal common bile duct in the head of the pancreas measures 14 mm. Gallbladder is distended. There is no wall thickening in gallbladder. There is no fluid around the gallbladder. Pancreas: No focal abnormalities are seen in pancreas. Evaluation is limited in this noncontrast study. Spleen: Unremarkable. Adrenals/Urinary Tract: Adrenals are unremarkable. There is no hydronephrosis. Small calcifications are seen in left renal artery branches. There are no demonstrable renal or ureteral stones. Urinary bladder is not distended. Stomach/Bowel: Stomach is not distended. Small bowel loops are not dilated. Appendix is not distinctly seen. There is no pericecal inflammation. There is abnormal wall thickening in sigmoid colon measuring up to 2.4 cm in thickness. Vascular/Lymphatic: There are calcifications in abdominal aorta and its major branches. There is aneurysmal dilation of infrarenal aorta measuring 4.6 x 4.1 cm. Evaluation of lumen is limited in this noncontrast study. There is aneurysmal dilation of right common iliac artery measuring 2.4 cm. There is aneurysmal dilation in left common iliac artery measuring 2.3 cm. There is no demonstrable retroperitoneal hematoma. No significant lymphadenopathy is seen. Reproductive: Prostate is enlarged. Other: There is no ascites or pneumoperitoneum. Umbilical hernia containing fat is seen. Musculoskeletal: Degenerative changes are noted in lumbar spine with disc space narrowing, bony spurs, facet  hypertrophy and encroachment of neural foramina at multiple levels, more so at L4-L5 and L5-S1 levels. IMPRESSION: There is no evidence of intestinal obstruction or pneumoperitoneum. There is no hydronephrosis. There is abnormal dilation of intrahepatic and extrahepatic bile ducts. Distal common bile duct measures 1.4 cm. Evaluation of pancreas in the region of ampulla is limited in this noncontrast study. Findings may be related to noncalcified calculus in the distal common bile duct or stricture in the distal common bile duct. Follow-up MRI and MRCP may be considered. Distention of gallbladder may be due to stricture in distal common bile duct and possibly fasting state of the patient. There is abnormal wall thickening in sigmoid colon. Possibility of malignant neoplastic process in sigmoid colon is not excluded. Endoscopic correlation should be considered. There is aneurysmal dilation of infrarenal abdominal aorta measuring 4.6 cm in diameter. There is aneurysmal dilation of both common iliac arteries measuring up to 2.4 cm. Recommend follow-up every 6 months and vascular consultation.Reference: J Am Coll Radiol 6440;34:742-595. Enlarged prostate.  Lumbar spondylosis. Other findings as described in the body of the report. Electronically Signed   By: Elmer Picker M.D.   On: 09/15/2022 21:45   US Abdomen Limited RUQ (LIVER/GB)  Result Date: 09/15/2022 CLINICAL DATA:  Pain right upper quadrant EXAM: ULTRASOUND ABDOMEN LIMITED RIGHT UPPER QUADRANT COMPARISON:  None Available. FINDINGS: Gallbladder: There are no demonstrable gallbladder stones. Technologist did not observe any tenderness  over the gallbladder. There are low-level echoes in the lumen suggesting possible sludge. There is no fluid around the gallbladder. Common bile duct: Diameter: Common bile duct measures 1.5 cm. There is dilation of intrahepatic bile ducts. Distal common bile duct is obscured by bowel gas. Liver: There is increased  echogenicity in liver. No focal abnormalities are seen in the visualized portions of liver. Portal vein is patent on color Doppler imaging with normal direction of blood flow towards the liver. Other: None. IMPRESSION: Sludge is seen in the lumen of gallbladder. There are no demonstrable gallbladder stones or imaging signs of acute cholecystitis. There is dilation of intrahepatic and visualized proximal extrahepatic bile ducts. Proximal common bile duct measures 1.5 cm in diameter. This may suggest stricture or calculus in the distal common bile duct. Distal common bile duct is obscured by bowel gas and not evaluated. If clinically warranted, follow-up CT may be considered. Electronically Signed   By: Elmer Picker M.D.   On: 09/15/2022 18:28      Signature  -   Lala Lund M.D on 09/17/2022 at 8:40 AM   -  To page go to [email or web address]

## 2022-09-17 NOTE — Transfer of Care (Signed)
Immediate Anesthesia Transfer of Care Note  Patient: [name] [name]  Procedure(s) Performed: ENDOSCOPIC RETROGRADE CHOLANGIOPANCREATOGRAPHY (ERCP) SPHINCTEROTOMY BILIARY STENT PLACEMENT BILIARY BRUSHING  Patient Location: PACU  Anesthesia Type:General  Level of Consciousness: awake and alert   Airway & Oxygen Therapy: Patient Spontanous Breathing and Patient connected to face mask oxygen  Post-op Assessment: Report given to RN and Post -op Vital signs reviewed and stable  Post vital signs: Reviewed and stable  Last Vitals:  Vitals Value Taken Time  BP 107/76 09/17/22 1548  Temp    Pulse 78 09/17/22 1548  Resp 15 09/17/22 1548  SpO2 98 % 09/17/22 1548  Vitals shown include unvalidated device data.  Last Pain:  Vitals:   09/17/22 1256  TempSrc: Temporal  PainSc: 0-No pain         Complications: No notable events documented.

## 2022-09-17 NOTE — Progress Notes (Signed)
PT Cancellation Note  Patient Details Name: [name] [name] MRN: [id number] DOB: 27-Dec-1955   Cancelled Treatment:    Reason Eval/Treat Not Completed: Patient at procedure or test/unavailable. Pt currently off unit. Will check back as schedule allows to initiate PT evaluation.    Thelma Comp 09/17/2022, 1:01 PM  Rolinda Roan, PT, DPT Acute Rehabilitation Services Secure Chat Preferred Office: [phone]

## 2022-09-17 NOTE — Anesthesia Procedure Notes (Signed)
Procedure Name: Intubation Date/Time: 09/17/2022 1:40 PM  Performed by: Reece Agar, CRNAPre-anesthesia Checklist: Patient identified, Emergency Drugs available, Suction available and Patient being monitored Patient Re-evaluated:Patient Re-evaluated prior to induction Oxygen Delivery Method: Circle System Utilized Preoxygenation: Pre-oxygenation with 100% oxygen Induction Type: IV induction Ventilation: Mask ventilation without difficulty Laryngoscope Size: Mac and 4 Grade View: Grade II Tube type: Oral Tube size: 7.5 mm Number of attempts: 1 Airway Equipment and Method: Stylet Placement Confirmation: ETT inserted through vocal cords under direct vision, positive ETCO2 and breath sounds checked- equal and bilateral Secured at: 23 cm Tube secured with: Tape Dental Injury: Teeth and Oropharynx as per pre-operative assessment

## 2022-09-17 NOTE — Interval H&P Note (Signed)
History and Physical Interval Note:  09/17/2022 12:51 PM  [name] [name]  has presented today for surgery, with the diagnosis of Elevated LFT's, Dilated CBD.  The various methods of treatment have been discussed with the patient and family. After consideration of risks, benefits and other options for treatment, the patient has consented to  Procedure(s): ENDOSCOPIC RETROGRADE CHOLANGIOPANCREATOGRAPHY (ERCP) (N/A) as a surgical intervention.  The patient's history has been reviewed, patient examined, no change in status, stable for surgery.  I have reviewed the patient's chart and labs.  Questions were answered to the patient's satisfaction.     Jackquline Denmark

## 2022-09-18 DIAGNOSIS — K831 Obstruction of bile duct: Secondary | ICD-10-CM | POA: Diagnosis not present

## 2022-09-18 DIAGNOSIS — R933 Abnormal findings on diagnostic imaging of other parts of digestive tract: Secondary | ICD-10-CM

## 2022-09-18 DIAGNOSIS — C801 Malignant (primary) neoplasm, unspecified: Secondary | ICD-10-CM

## 2022-09-18 DIAGNOSIS — K838 Other specified diseases of biliary tract: Secondary | ICD-10-CM | POA: Diagnosis not present

## 2022-09-18 DIAGNOSIS — I5022 Chronic systolic (congestive) heart failure: Secondary | ICD-10-CM | POA: Diagnosis not present

## 2022-09-18 LAB — GASTROINTESTINAL PANEL BY PCR, STOOL (REPLACES STOOL CULTURE)

## 2022-09-18 LAB — CBC WITH DIFFERENTIAL/PLATELET
Abs Immature Granulocytes: 0.18 10*3/uL — ABNORMAL HIGH (ref 0.00–0.07)
Basophils Absolute: 0 10*3/uL (ref 0.0–0.1)
Basophils Relative: 0 %
Eosinophils Absolute: 0 10*3/uL (ref 0.0–0.5)
Eosinophils Relative: 0 %
HCT: 30.6 % — ABNORMAL LOW (ref 39.0–52.0)
Hemoglobin: 11.2 g/dL — ABNORMAL LOW (ref 13.0–17.0)
Immature Granulocytes: 1 %
Lymphocytes Relative: 6 %
Lymphs Abs: 0.9 10*3/uL (ref 0.7–4.0)
MCH: 34.1 pg — ABNORMAL HIGH (ref 26.0–34.0)
MCHC: 36.6 g/dL — ABNORMAL HIGH (ref 30.0–36.0)
MCV: 93.3 fL (ref 80.0–100.0)
Monocytes Absolute: 0.5 10*3/uL (ref 0.1–1.0)
Monocytes Relative: 3 %
Neutro Abs: 14.2 10*3/uL — ABNORMAL HIGH (ref 1.7–7.7)
Neutrophils Relative %: 90 %
Platelets: 321 10*3/uL (ref 150–400)
RBC: 3.28 MIL/uL — ABNORMAL LOW (ref 4.22–5.81)
RDW: 16.8 % — ABNORMAL HIGH (ref 11.5–15.5)
WBC: 15.8 10*3/uL — ABNORMAL HIGH (ref 4.0–10.5)
nRBC: 0 % (ref 0.0–0.2)

## 2022-09-18 LAB — COMPREHENSIVE METABOLIC PANEL
ALT: 61 U/L — ABNORMAL HIGH (ref 0–44)
AST: 86 U/L — ABNORMAL HIGH (ref 15–41)
Albumin: 1.7 g/dL — ABNORMAL LOW (ref 3.5–5.0)
Alkaline Phosphatase: 616 U/L — ABNORMAL HIGH (ref 38–126)
Anion gap: 11 (ref 5–15)
BUN: 14 mg/dL (ref 8–23)
CO2: 17 mmol/L — ABNORMAL LOW (ref 22–32)
Calcium: 8.2 mg/dL — ABNORMAL LOW (ref 8.9–10.3)
Chloride: 104 mmol/L (ref 98–111)
Creatinine, Ser: 1.16 mg/dL (ref 0.61–1.24)
GFR, Estimated: >60 mL/min (ref >60)
Glucose, Bld: 136 mg/dL — ABNORMAL HIGH (ref 70–99)
Potassium: 3.5 mmol/L (ref 3.5–5.1)
Sodium: 132 mmol/L — ABNORMAL LOW (ref 135–145)
Total Bilirubin: 27.8 mg/dL (ref 0.3–1.2)
Total Protein: 4.9 g/dL — ABNORMAL LOW (ref 6.5–8.1)

## 2022-09-18 LAB — C-REACTIVE PROTEIN: CRP: 17 mg/dL — ABNORMAL HIGH (ref <1.0)

## 2022-09-18 LAB — PROTIME-INR
INR: 1.2 (ref 0.8–1.2)
Prothrombin Time: 15 seconds (ref 11.4–15.2)

## 2022-09-18 LAB — MAGNESIUM: Magnesium: 2 mg/dL (ref 1.7–2.4)

## 2022-09-18 LAB — BRAIN NATRIURETIC PEPTIDE: B Natriuretic Peptide: 879.6 pg/mL — ABNORMAL HIGH (ref 0.0–100.0)

## 2022-09-18 MED ORDER — HEPARIN SODIUM (PORCINE) 5000 UNIT/ML IJ SOLN: 5000.0000 [IU] | Freq: Three times a day (TID) | INTRAMUSCULAR | Status: DC

## 2022-09-18 MED ORDER — POTASSIUM CHLORIDE 20 MEQ PO PACK
40.0000 meq | PACK | Freq: Once | ORAL | Status: AC
  Administered 2022-09-18: 40 meq via ORAL

## 2022-09-18 NOTE — Progress Notes (Signed)
PROGRESS NOTE                                                                                                                                                                                                             Patient Demographics:    [name] [name], is a 66 y.o. male, DOB - [birth year], LZJ:[id number]  Outpatient Primary MD for the patient is Pcp, No    LOS - 2  Admit date - 09/15/2022    Chief Complaint  Patient presents with   Abnormal Lab       Brief Narrative (HPI from H&P)   66 year old African-American male history of nonischemic cardiomyopathy status post ICD placement, chronic systolic heart failure with an EF of 25%, chronic tobacco abuse, hypertension who presents to the ER from urgent care due to abnormal labs. He was seen in urgent care on the 19th due to 2 weeks of diarrhea. He had lab was drawn in urgent care. It revealed a sodium of 134, potassium 2.9, creatinine 1.65, bilirubin of 42, alk phos of 86.    Subjective:   Patient in bed, appears comfortable, denies any headache, no fever, no chest pain or pressure, no shortness of breath , no abdominal pain. No new focal weakness.   Assessment  & Plan :    Hyperbilirubinemia, severe jaundice with unintentional weight loss.  Imaging of the abdomen pelvis suggestive of CBD stricture/stone along with sigmoid colon thickening, patient does not have pain, unfortunately he has AICD which is not MRI compatible.  GI will be consulted, he underwent ERCP on 09/17/2022 showing CBD stricture requiring stent placement, biopsies obtained, suspicious for malignancy.  Clinically improving continuing empiric antibiotics for possible ascending cholangitis, defer further management to GI.  AKI (acute kidney injury) ([place])  Hold diuretics, Entresto. Continue with gentle IVF.  Hypokalemia - replaced IV and oral stable magnesium.  Nonischemic cardiomyopathy with chronic  systolic heart failure EF 25% on recent echo, s/p AICD in place.  Currently severely dehydrated.  Beta-blocker, ACE/ARB, Entresto and diuretics held.  Being hydrated with IV fluids.  Essential hypertension, benign  Hold HTN meds for now due to low BP.   Hyponatremia.  Likely SIADH.  Fluid restrict and monitor.       Condition - Extremely Guarded  Family Communication  :  daughter Elmo [name] [phone]   on 09/16/22  Code Status :  Full  Consults  :  GI  PUD Prophylaxis : PPI   Procedures  :     ERCP - Impression:               - A single severe biliary stricture was found in the lower third of the main bile duct. The                            stricture was malignant appearing. The entire biliary tree was dilated. Cells for cytology obtained in the lower third of                            the main duct.  A biliary sphincterotomy was performed. One plastic stent was placed into the common bile                            duct.  Recommendation:           - Clear liquid diet.                           - Return patient to hospital ward for ongoing care.                           - Follow cytology                           - Check CA19-9, CEA.                           - Trend LFTs.                           - Resume SQ hepatin in 48 hrs.                           - Likely will need EUS as out-pt.                           - Watch for pancreatitis, bleeding, perforation,                            and cholangitis.  CT - There is no evidence of intestinal obstruction or pneumoperitoneum. There is no hydronephrosis. There is abnormal dilation of intrahepatic and extrahepatic bile ducts. Distal common bile duct measures 1.4 cm. Evaluation of pancreas in the region of ampulla is limited in this noncontrast study. Findings may be related to noncalcified calculus in the distal common bile duct or stricture in the distal common bile duct. Follow-up MRI and MRCP may be considered. Distention of  gallbladder may be due to stricture in distal common bile duct and possibly fasting state of the patient. There is abnormal wall thickening in sigmoid colon. Possibility of malignant neoplastic process in sigmoid colon is not excluded. Endoscopic correlation should be considered. There is aneurysmal dilation of infrarenal abdominal aorta measuring 4.6 cm in diameter. There is aneurysmal dilation of both common iliac arteries measuring up to 2.4 cm. Recommend follow-up every 6 months and vascular consultation.  [place] - Sludge is seen in the lumen of gallbladder. There are no demonstrable gallbladder stones or imaging signs of acute cholecystitis. There is dilation of intrahepatic and visualized proximal extrahepatic bile ducts. Proximal common bile duct measures 1.5 cm in diameter. This may suggest stricture or calculus in the distal common bile duct. Distal common bile duct is obscured by bowel gas and not evaluated. If clinically warranted, follow-up CT may be considered.      Disposition Plan  :    Status is: Inpatient   DVT Prophylaxis  :    heparin injection 5,000 Units Start: 09/20/22 0600 Place and maintain sequential compression device Start: 09/18/22 0925 SCDs Start: 09/16/22 0505    Lab Results  Component Value Date   PLT 321 09/18/2022    Diet :  Diet Order             DIET SOFT Fluid consistency: Thin  Diet effective now                    Inpatient Medications  Scheduled Meds:  carvedilol  3.125 mg Oral BID WC   diclofenac  100 mg Rectal Once   digoxin  0.125 mg Oral Daily   [START ON 09/20/2022] heparin injection (subcutaneous)  5,000 Units Subcutaneous Q8H   isosorbide mononitrate  15 mg Oral Daily   lactulose  20 g Oral BID   pantoprazole  40 mg Oral Daily   potassium chloride  40 mEq Oral Once   Continuous Infusions:   PRN Meds:.melatonin, ondansetron **OR** ondansetron (ZOFRAN) IV  Antibiotics  :    Anti-infectives (From admission, onward)     Start     Dose/Rate Route Frequency Ordered Stop   09/17/22 1315  Ampicillin-Sulbactam (UNASYN) 3 g in sodium chloride 0.9 % 100 mL IVPB        3 g 200 mL/hr over 30 Minutes Intravenous  Once 09/17/22 1305 09/17/22 1442        Objective:   Vitals:   09/18/22 0024 09/18/22 0605 09/18/22 0735 09/18/22 0836  BP: 112/79 109/73    Pulse: 75 (!) 59 62 (!) 55  Resp: _0 Temp: 98.1 F (36.7 C) (!) 97.5 F (36.4 C) 97.6 F (36.4 C)   TempSrc: Oral Oral Oral   SpO2: 100% 100% 100% 100%  Weight:      Height:        Wt Readings from Last 3 Encounters:  09/17/22 65.8 kg  02/16/22 66.7 kg  12/14/21 66.9 kg     Intake/Output Summary (Last 24 hours) at 09/18/2022 5284 Last data filed at 09/17/2022 2252 Gross per 24 hour  Intake 3518.54 ml  Output 0 ml  Net 3518.54 ml      Physical Exam  Frail elderly African-American gentleman severely jaundiced, awake Alert, No new F.N deficits, Normal affect [place].AT, severe scleral icterus Supple Neck, No JVD,   Symmetrical Chest wall movement, Good air movement bilaterally, CTAB RRR,No Gallops,Rubs or new Murmurs,  +ve B.Sounds, Abd Soft, No tenderness,   No Cyanosis, Clubbing or edema       Data Review:    Recent Labs  Lab 09/14/22 1915 09/15/22 2136 09/15/22 2208 09/17/22 0633 09/17/22 1311 09/18/22 0612  WBC 16.7* 18.7*  --  14.7*  --  15.8*  HGB 14.6 13.9 15.6 12.2* 13.9 11.2*  HCT 40.3 40.4 46.0 33.8* 41.0 30.6*  PLT 421* 377  --  328  --  321  MCV 92.6 95.7  --  92.6  --  93.3  MCH 33.6 32.9  --  33.4  --  34.1*  MCHC 36.2* 34.4  --  36.1*  --  36.6*  RDW 17.2* 17.2*  --  16.9*  --  16.8*  LYMPHSABS 1.1 1.0  --  1.1  --  0.9  MONOABS 1.0 1.1*  --  1.0  --  0.5  EOSABS 0.0 0.0  --  0.0  --  0.0  BASOSABS 0.0 0.0  --  0.1  --  0.0    Recent Labs  Lab 09/14/22 1915 09/15/22 2136 09/15/22 2208 09/16/22 0100 09/16/22 0647 09/16/22 0700 09/16/22 1344 09/17/22 0633 09/17/22 1311 09/18/22 0612  NA 134*  136 135  --   --   --  136 136 137 132*  K 2.9* 2.7* 2.8*  --   --   --  3.1* 2.8* 3.2* 3.5  CL 94* 98 99  --   --   --  103 103 102 104  CO2 22 24  --   --   --   --  25 24  --  17*  ANIONGAP 18* 14  --   --   --   --  8 9  --  11  GLUCOSE 123* 132* 138*  --   --   --  103* 80 98 136*  BUN _0 --   --   --  _1 CREATININE 1.65* 1.74* 1.70*  --   --   --  1.25* 1.30* 1.00 1.16  AST 133* 128*  --   --   --   --   --  109*  --  86*  ALT 77* 75*  --   --   --   --   --  64*  --  61*  ALKPHOS 861* 790*  --   --   --   --   --  634*  --  616*  BILITOT 42.0* 42.0*  --   --   --   --   --  33.5*  --  27.8*  ALBUMIN 2.2* 2.1*  --   --   --   --   --  1.7*  --  1.7*  CRP  --   --   --   --   --   --   --  22.4*  --  17.0*  LATICACIDVEN  --  2.3*  --  2.0*  --   --   --   --   --   --   INR  --   --   --   --   --  2.2*  --   --   --  1.2  AMMONIA  --   --   --  55*  --   --   --   --   --   --   BNP  --   --   --   --  215.3*  --   --  391.9*  --  879.6*  MG  --   --   --  2.2  --   --   --  2.2  --  2.0  CALCIUM 9.4 9.2  --   --   --   --  9.0 8.8*  --  8.2*     Radiology Reports DG ERCP  Result Date: 09/17/2022 CLINICAL DATA:  Biliary ductal dilation. EXAM: ERCP COMPARISON:  CT  AP, 09/15/2022.  US Abdomen, 09/15/2022. FLUOROSCOPY: Exposure Index (as provided by the fluoroscopic device): 28.3 mGy Kerma FINDINGS: Multiple, limited oblique planar images of the RIGHT upper quadrant obtained C-arm. Images demonstrating flexible endoscopy, biliary duct cannulation, sphincterotomy, retrograde cholangiogram and plastic biliary stent placement. Intrahepatic biliary ductal dilation is demonstrated. IMPRESSION: Fluoroscopic imaging for ERCP and plastic biliary stent placement. For complete description of intra procedural findings, please see performing service dictation. Electronically Signed   By: Michaelle Birks M.D.   On: 09/17/2022 17:28   DG C-Arm 1-60 Min-No Report  Result Date:  09/17/2022 Fluoroscopy was utilized by the requesting physician.  No radiographic interpretation.   CT ABDOMEN PELVIS WO CONTRAST  Result Date: 09/15/2022 CLINICAL DATA:  Left-sided abdominal pain, diarrhea EXAM: CT ABDOMEN AND PELVIS WITHOUT CONTRAST TECHNIQUE: Multidetector CT imaging of the abdomen and pelvis was performed following the standard protocol without IV contrast. RADIATION DOSE REDUCTION: This exam was performed according to the departmental dose-optimization program which includes automated exposure control, adjustment of the mA and/or kV according to patient size and/or use of iterative reconstruction technique. COMPARISON:  Abdomen sonogram done earlier today, CT abdomen done on 11/27/2004. FINDINGS: Lower chest: Pacer leads are noted in place. Visualized lower lung fields are clear. Hepatobiliary: There is abnormal dilation of intrahepatic and extrahepatic bile ducts. Distal common bile duct in the head of the pancreas measures 14 mm. Gallbladder is distended. There is no wall thickening in gallbladder. There is no fluid around the gallbladder. Pancreas: No focal abnormalities are seen in pancreas. Evaluation is limited in this noncontrast study. Spleen: Unremarkable. Adrenals/Urinary Tract: Adrenals are unremarkable. There is no hydronephrosis. Small calcifications are seen in left renal artery branches. There are no demonstrable renal or ureteral stones. Urinary bladder is not distended. Stomach/Bowel: Stomach is not distended. Small bowel loops are not dilated. Appendix is not distinctly seen. There is no pericecal inflammation. There is abnormal wall thickening in sigmoid colon measuring up to 2.4 cm in thickness. Vascular/Lymphatic: There are calcifications in abdominal aorta and its major branches. There is aneurysmal dilation of infrarenal aorta measuring 4.6 x 4.1 cm. Evaluation of lumen is limited in this noncontrast study. There is aneurysmal dilation of right common iliac artery  measuring 2.4 cm. There is aneurysmal dilation in left common iliac artery measuring 2.3 cm. There is no demonstrable retroperitoneal hematoma. No significant lymphadenopathy is seen. Reproductive: Prostate is enlarged. Other: There is no ascites or pneumoperitoneum. Umbilical hernia containing fat is seen. Musculoskeletal: Degenerative changes are noted in lumbar spine with disc space narrowing, bony spurs, facet hypertrophy and encroachment of neural foramina at multiple levels, more so at L4-L5 and L5-S1 levels. IMPRESSION: There is no evidence of intestinal obstruction or pneumoperitoneum. There is no hydronephrosis. There is abnormal dilation of intrahepatic and extrahepatic bile ducts. Distal common bile duct measures 1.4 cm. Evaluation of pancreas in the region of ampulla is limited in this noncontrast study. Findings may be related to noncalcified calculus in the distal common bile duct or stricture in the distal common bile duct. Follow-up MRI and MRCP may be considered. Distention of gallbladder may be due to stricture in distal common bile duct and possibly fasting state of the patient. There is abnormal wall thickening in sigmoid colon. Possibility of malignant neoplastic process in sigmoid colon is not excluded. Endoscopic correlation should be considered. There is aneurysmal dilation of infrarenal abdominal aorta measuring 4.6 cm in diameter. There is aneurysmal dilation of both common iliac arteries measuring up to 2.4 cm.  Recommend follow-up every 6 months and vascular consultation.Reference: J Am Coll Radiol 1749;[id number]. Enlarged prostate.  Lumbar spondylosis. Other findings as described in the body of the report. Electronically Signed   By: Elmer Picker M.D.   On: 09/15/2022 21:45   US Abdomen Limited RUQ (LIVER/GB)  Result Date: 09/15/2022 CLINICAL DATA:  Pain right upper quadrant EXAM: ULTRASOUND ABDOMEN LIMITED RIGHT UPPER QUADRANT COMPARISON:  None Available. FINDINGS:  Gallbladder: There are no demonstrable gallbladder stones. Technologist did not observe any tenderness over the gallbladder. There are low-level echoes in the lumen suggesting possible sludge. There is no fluid around the gallbladder. Common bile duct: Diameter: Common bile duct measures 1.5 cm. There is dilation of intrahepatic bile ducts. Distal common bile duct is obscured by bowel gas. Liver: There is increased echogenicity in liver. No focal abnormalities are seen in the visualized portions of liver. Portal vein is patent on color Doppler imaging with normal direction of blood flow towards the liver. Other: None. IMPRESSION: Sludge is seen in the lumen of gallbladder. There are no demonstrable gallbladder stones or imaging signs of acute cholecystitis. There is dilation of intrahepatic and visualized proximal extrahepatic bile ducts. Proximal common bile duct measures 1.5 cm in diameter. This may suggest stricture or calculus in the distal common bile duct. Distal common bile duct is obscured by bowel gas and not evaluated. If clinically warranted, follow-up CT may be considered. Electronically Signed   By: Elmer Picker M.D.   On: 09/15/2022 18:28      Signature  -   Lala Lund M.D on 09/18/2022 at 9:25 AM   -  To page go to [email or web address]

## 2022-09-18 NOTE — Progress Notes (Addendum)
Attending physician's note   I have taken a history, reviewed the chart, and examined the patient. I performed a substantive portion of this encounter, including complete performance of at least one of the key components, in conjunction with the APP. I agree with the APP's note, impression, and recommendations with my edits.   ERCP completed yesterday with plastic CBD stent.  Path from CBD brushings pending, and likely will not be back until after the holiday weekend.  Discussed the ERCP findings with the patient.  Liver enzymes improving with downtrending T. bili (27.8).  No abdominal pain and tolerating diet and has full tray ordered for lunch.  We additionally discussed the CT findings which showed area of thickening in the sigmoid colon.  No previous colonoscopy.  He would like to defer colonoscopy to be done as an outpatient.   - Diet already being advanced - Repeat liver enzymes in the morning.  If continued downtrend and he is feeling better clinically, can hopefully discharge in the next 24-48 hours or so. - Plan for outpatient colonoscopy depending on pending CBD brushing results - Consideration for outpatient EUS depending on CBD brushing results - Will follow-up on pending path likely as an outpatient - GI service will sign off at this time.  Please do not hesitate to contact us with additional questions or concerns.  Will start making arrangements for outpatient follow-up in our office  [address] [place], [place] [phone] office          Progress Note   Subjective  Chief Complaint: Hyperbilirubinemia  Status post ERCP 09/17/2022 with malignant appearing stricture status post sphincterotomy and stent placement  This morning, patient tells me that he feels okay.  He is eating a clear liquid diet and has had no abdominal pain at all.   Objective   Vital signs in last 24 hours: Temp:  [97.3 F (36.3 C)-98.2 F (36.8 C)] 97.6 F (36.4 C) (12/23 0735) Pulse  Rate:  [55-80] 55 (12/23 0836) Resp:  [16-22] 16 (12/23 0735) BP: (97-114)/(66-79) 109/73 (12/23 0605) SpO2:  [97 %-100 %] 100 % (12/23 0836) Weight:  [65.8 kg] 65.8 kg (12/22 1256) Last BM Date : 09/18/22 General:    AA male in NAD with icterus Heart:  Regular rate and rhythm; no murmurs Lungs: Respirations even and unlabored, lungs CTA bilaterally Abdomen:  Soft, nontender and nondistended. Normal bowel sounds. Psych:  Cooperative. Normal mood and affect.  Intake/Output from previous day: 12/22 0701 - 12/23 0700 In: 3518.5 [P.O.:200; I.V.:2818.5; IV Piggyback:500] Out: 0    Lab Results: Recent Labs    09/15/22 2136 09/15/22 2208 09/17/22 0633 09/17/22 1311 09/18/22 0612  WBC 18.7*  --  14.7*  --  15.8*  HGB 13.9   < > 12.2* 13.9 11.2*  HCT 40.4   < > 33.8* 41.0 30.6*  PLT 377  --  328  --  321   < > = values in this interval not displayed.   BMET Recent Labs    09/16/22 1344 09/17/22 0633 09/17/22 1311 09/18/22 0612  NA 136 136 137 132*  K 3.1* 2.8* 3.2* 3.5  CL 103 103 102 104  CO2 25 24  --  17*  GLUCOSE 103* 80 98 136*  BUN _0 CREATININE 1.25* 1.30* 1.00 1.16  CALCIUM 9.0 8.8*  --  8.2*      Latest Ref Rng & Units 09/18/2022    6:12 AM 09/17/2022    6:33  AM 09/16/2022    1:44 PM  Hepatic Function  Total Protein 6.5 - 8.1 g/dL 4.9  5.4    Albumin 3.5 - 5.0 g/dL 1.7  1.7    AST 15 - 41 U/L 86  109    ALT 0 - 44 U/L 61  64    Alk Phosphatase 38 - 126 U/L 616  634    Total Bilirubin 0.3 - 1.2 mg/dL 27.8  33.5    Bilirubin, Direct 0.0 - 0.2 mg/dL   22.1  C    C Corrected result     PT/INR Recent Labs    09/16/22 0700 09/18/22 0612  LABPROT 24.2* 15.0  INR 2.2* 1.2    Studies/Results: DG ERCP  Result Date: 09/17/2022 CLINICAL DATA:  Biliary ductal dilation. EXAM: ERCP COMPARISON:  CT AP, 09/15/2022.  US Abdomen, 09/15/2022. FLUOROSCOPY: Exposure Index (as provided by the fluoroscopic device): 28.3 mGy Kerma FINDINGS: Multiple,  limited oblique planar images of the RIGHT upper quadrant obtained C-arm. Images demonstrating flexible endoscopy, biliary duct cannulation, sphincterotomy, retrograde cholangiogram and plastic biliary stent placement. Intrahepatic biliary ductal dilation is demonstrated. IMPRESSION: Fluoroscopic imaging for ERCP and plastic biliary stent placement. For complete description of intra procedural findings, please see performing service dictation. Electronically Signed   By: Michaelle Birks M.D.   On: 09/17/2022 17:28   DG C-Arm 1-60 Min-No Report  Result Date: 09/17/2022 Fluoroscopy was utilized by the requesting physician.  No radiographic interpretation.     Assessment / Plan:   Assessment: 1.  Elevated LFTs: With CBD dilation and thickening of the sigmoid colon, LFTs decreasing today after ERCP yesterday with likely malignant appearing stricture status post dilation and stent placement as well as sphincterotomy 2.  CHF: Ejection fraction 25% at last echo 02/16/2022 3.  ICD in place 4.  Hypokalemia: Improving 5.  Hypertension 6.  Diarrhea: Stool studies ordered, C. difficile negative, GI path panel pending 7.  Abnormal sigmoid colon seen on CT with concern for malignancy  Plan: 1.  Discussed results from ERCP with patient today, he was not sure that he heard them yesterday. 2.  Awaiting pathology 3.  Continue to monitor LFTs 4.  Agree with soft diet today and increase as tolerated 5.  At this juncture no need for colonoscopy for further evaluation of abnormal sigmoid colon, pending path results could pursue as an outpatient/inpatient  in the future. 6.  We may follow peripherally until results from pathology, please let us know if we can be of any further assistance  Thank you for your kind consultation.    LOS: 2 days   Levin Erp  09/18/2022, 9:27 AM

## 2022-09-18 NOTE — Evaluation (Signed)
Occupational Therapy Evaluation Patient Details Name: [name] [name] MRN: [id number] DOB: Jul 15, 1956 Today's Date: 09/18/2022   History of Present Illness Patient is a 67 year old male who presented with diarrhea and abnormal labs from urgent care. Patient was admitted with significant hyperbilirubinemia, mild AKI, and hypokalemia. Patient underwent ERCP on 12/22  PMH: cardiomyopathy s/p ICD placement, chronic systolic heart failure with EF of 25%, chronic tobacco abuse, HTN,   Clinical Impression   Patient is a 66 year old male who was admitted for above. Patient was living at home alone with PRN support from daughter at baseline. Patient was min guard for mobility in room with no AD with some reaching out for furniture at times. Patient was min A for Lb dressing at this time with some discomfort with bringing BLE into figure four positioning. Patient anticipated to progress to baseline prior to d/c from hospital. Patient was noted to have decreased functional activity tolerance, decreased standing balance, decreased time OOB, and increased need for education for AD/AE to increase independence in ADLs. Patient would continue to benefit from skilled OT services at this time while admitted and after d/c to address noted deficits in order to improve overall safety and independence in ADLs.       Recommendations for follow up therapy are one component of a multi-disciplinary discharge planning process, led by the attending physician.  Recommendations may be updated based on patient status, additional functional criteria and insurance authorization.   Follow Up Recommendations  Home health OT (pending progress during acute stay)     Assistance Recommended at Discharge Frequent or constant Supervision/Assistance  Patient can return home with the following A little help with walking and/or transfers;Assistance with cooking/housework;Direct supervision/assist for medications management;Assist for  transportation;Direct supervision/assist for financial management;Help with stairs or ramp for entrance    Functional Status Assessment  Patient has had a recent decline in their functional status and demonstrates the ability to make significant improvements in function in a reasonable and predictable amount of time.  Equipment Recommendations  None recommended by OT    Recommendations for Other Services       Precautions / Restrictions Restrictions Weight Bearing Restrictions: No      Mobility Bed Mobility Overal bed mobility: Modified Independent                     Balance Overall balance assessment: Mild deficits observed, not formally tested           ADL either performed or assessed with clinical judgement   ADL Overall ADL's : Needs assistance/impaired Eating/Feeding: Supervision/ safety;Sitting   Grooming: Wash/dry face;Wash/dry hands;Supervision/safety;Standing Grooming Details (indicate cue type and reason): at sink in bathroom Upper Body Bathing: Min guard;Sitting   Lower Body Bathing: Minimal assistance;Sitting/lateral leans Lower Body Bathing Details (indicate cue type and reason): some difficulty with crossing legs sitting EOB Upper Body Dressing : Sitting;Min guard   Lower Body Dressing: Sitting/lateral leans;Minimal assistance Lower Body Dressing Details (indicate cue type and reason): some difficulty with figure four positioning simulated tasks. decliend to actually doff socks Toilet Transfer: Min guard;Ambulation Toilet Transfer Details (indicate cue type and reason): with no AD with patient noted to hold onto footboard but able to walk with no support to bathroom. patient able to transiton on and off commode with supervision no physical A no LOB Toileting- Clothing Manipulation and Hygiene: Min guard;Sit to/from stand       Functional mobility during ADLs: Min guard General ADL Comments: no AD  in room. patient declined to sit up in recliner  on this date. patient was educated on importance of getting out ofbed. patient agreed to get out of bed later for eating meal.     Vision Patient Visual Report: No change from baseline              Pertinent Vitals/Pain Pain Assessment Pain Assessment: No/denies pain     Hand Dominance Right   Extremity/Trunk Assessment Upper Extremity Assessment Upper Extremity Assessment: Overall WFL for tasks assessed   Lower Extremity Assessment Lower Extremity Assessment: Defer to PT evaluation   Cervical / Trunk Assessment Cervical / Trunk Assessment: Normal   Communication Communication Communication: No difficulties   Cognition Arousal/Alertness: Awake/alert Behavior During Therapy: WFL for tasks assessed/performed Overall Cognitive Status: Within Functional Limits for tasks assessed             General Comments  patient was noted to have golf ball size area on occpital area of head and posterior L elbow. patient reported they have been there for a while.            Home Living Family/patient expects to be discharged to:: Private residence Living Arrangements: Alone Available Help at Discharge: Family;Available PRN/intermittently Type of Home: House Home Access: Stairs to enter Entrance Stairs-Number of Steps: 2 no rails at his house daughters house has more steps but rails.   Home Layout: One level     Bathroom Shower/Tub: Tub/shower unit         Home Equipment: Cane - single point   Additional Comments: patient reported he could transition to daughters if needed at time of d/c.      Prior Functioning/Environment Prior Level of Function : Independent/Modified Independent             Mobility Comments: patient uses cane in community.          OT Problem List: Impaired balance (sitting and/or standing);Decreased safety awareness;Decreased knowledge of use of DME or AE;Decreased knowledge of precautions      OT Treatment/Interventions:  Self-care/ADL training;Energy conservation;Therapeutic exercise;DME and/or AE instruction;Therapeutic activities;Patient/family education;Balance training    OT Goals(Current goals can be found in the care plan section) Acute Rehab OT Goals Patient Stated Goal: to get warm OT Goal Formulation: With patient Time For Goal Achievement: 09/28/2022 Potential to Achieve Goals: Fair  OT Frequency: Min 2X/week       AM-PAC OT "6 Clicks" Daily Activity     Outcome Measure Help from another person eating meals?: None Help from another person taking care of personal grooming?: None Help from another person toileting, which includes using toliet, bedpan, or urinal?: A Little Help from another person bathing (including washing, rinsing, drying)?: A Little Help from another person to put on and taking off regular upper body clothing?: A Little Help from another person to put on and taking off regular lower body clothing?: A Little 6 Click Score: 20   End of Session Nurse Communication: Other (comment) (ok to participate in session)  Activity Tolerance: Patient tolerated treatment well Patient left: in bed;with call bell/phone within reach;Other (comment) (PT in room)  OT Visit Diagnosis: Unsteadiness on feet (R26.81);Other abnormalities of gait and mobility (R26.89)                Time: 3016-0109 OT Time Calculation (min): 18 min Charges:  OT General Charges $OT Visit: 1 Visit OT Evaluation $OT Eval Moderate Complexity: 1 Mod  [name] [name] OTR/L, MS Acute Rehabilitation Department Office# [phone]  [name] [name] 09/18/2022, 10:11 AM

## 2022-09-18 NOTE — Evaluation (Signed)
Physical Therapy Evaluation/ Discharge Patient Details Name: [name] [name] MRN: [id number] DOB: [birth year] Today's Date: 09/18/2022  History of Present Illness  66 yo admitted 12/19 with diarrhea and fatigue with hyperbilirubinemia. 12/22 ERCP. PMhx: NICM, ICD, CHF with EF 25%, HTN  Clinical Impression  Pt pleasant and able to walk in hall with cane without LOB. PT encouraged to have railing installed on stairs to ease transition with stairs. Pt at baseline functional level without pt performing toileting and in room mobility without use of cane or assist. Pt encouraged to be OOB to chair for meals and up to bathroom rather than BSC. No further acute therapy needs will defer to mobility with pt aware and agreeable.        Recommendations for follow up therapy are one component of a multi-disciplinary discharge planning process, led by the attending physician.  Recommendations may be updated based on patient status, additional functional criteria and insurance authorization.  Follow Up Recommendations No PT follow up      Assistance Recommended at Discharge None  Patient can return home with the following       Equipment Recommendations None recommended by PT  Recommendations for Other Services       Functional Status Assessment Patient has not had a recent decline in their functional status     Precautions / Restrictions Precautions Precautions: None Restrictions Weight Bearing Restrictions: No      Mobility  Bed Mobility Overal bed mobility: Modified Independent                  Transfers Overall transfer level: Independent                      Ambulation/Gait Ambulation/Gait assistance: Modified independent (Device/Increase time) Gait Distance (Feet): 300 Feet Assistive device: Straight cane Gait Pattern/deviations: WFL(Within Functional Limits)   Gait velocity interpretation: >2.62 ft/sec, indicative of community ambulatory       Stairs Stairs: Yes Stairs assistance: Modified independent (Device/Increase time) Stair Management: One rail Left, With cane Number of Stairs: 3 General stair comments: pt ascended 3 steps with use of cane but relying on rail and cane for descent, pt stated he can have railing installed  Wheelchair Mobility    Modified Rankin (Stroke Patients Only)       Balance Overall balance assessment: Mild deficits observed, not formally tested                                           Pertinent Vitals/Pain Pain Assessment Pain Assessment: No/denies pain    Home Living Family/patient expects to be discharged to:: Private residence Living Arrangements: Alone Available Help at Discharge: Family;Available PRN/intermittently Type of Home: House Home Access: Stairs to enter Entrance Stairs-Rails: None Entrance Stairs-Number of Steps: 2   Home Layout: One level Home Equipment: Cane - single point Additional Comments: patient reported he could transition to daughters if needed at time of d/c.    Prior Function Prior Level of Function : Independent/Modified Independent             Mobility Comments: patient uses cane in community.       Hand Dominance   Dominant Hand: Right    Extremity/Trunk Assessment   Upper Extremity Assessment Upper Extremity Assessment: Overall WFL for tasks assessed    Lower Extremity Assessment Lower Extremity Assessment: Overall WFL for tasks assessed  Cervical / Trunk Assessment Cervical / Trunk Assessment: Normal  Communication   Communication: No difficulties  Cognition Arousal/Alertness: Awake/alert Behavior During Therapy: WFL for tasks assessed/performed Overall Cognitive Status: Within Functional Limits for tasks assessed                                          General Comments General comments (skin integrity, edema, etc.): patient was noted to have golf ball size area on occpital area of  head and posterior L elbow. patient reported they have been there for a while.    Exercises     Assessment/Plan    PT Assessment Patient does not need any further PT services  PT Problem List         PT Treatment Interventions      PT Goals (Current goals can be found in the Care Plan section)  Acute Rehab PT Goals PT Goal Formulation: All assessment and education complete, DC therapy    Frequency       Co-evaluation               AM-PAC PT "6 Clicks" Mobility  Outcome Measure Help needed turning from your back to your side while in a flat bed without using bedrails?: None Help needed moving from lying on your back to sitting on the side of a flat bed without using bedrails?: None Help needed moving to and from a bed to a chair (including a wheelchair)?: None Help needed standing up from a chair using your arms (e.g., wheelchair or bedside chair)?: None Help needed to walk in hospital room?: None Help needed climbing 3-5 steps with a railing? : None 6 Click Score: 24    End of Session   Activity Tolerance: Patient tolerated treatment well Patient left: in bed (pt refused OOB to chair despite education and encouragement) Nurse Communication: Mobility status PT Visit Diagnosis: Other abnormalities of gait and mobility (R26.89)    Time: 9675-9163 PT Time Calculation (min) (ACUTE ONLY): 21 min   Charges:   PT Evaluation $PT Eval Low Complexity: 1 Low          Jozeph Persing P, PT Acute Rehabilitation Services Office: [place] Caylynn Minchew 09/18/2022, 10:54 AM

## 2022-09-19 LAB — CBC WITH DIFFERENTIAL/PLATELET
Abs Immature Granulocytes: 0.41 10*3/uL — ABNORMAL HIGH (ref 0.00–0.07)
Basophils Absolute: 0 10*3/uL (ref 0.0–0.1)
Basophils Relative: 0 %
Eosinophils Absolute: 0 10*3/uL (ref 0.0–0.5)
Eosinophils Relative: 0 %
HCT: 30.5 % — ABNORMAL LOW (ref 39.0–52.0)
Hemoglobin: 10.8 g/dL — ABNORMAL LOW (ref 13.0–17.0)
Immature Granulocytes: 2 %
Lymphocytes Relative: 7 %
Lymphs Abs: 1.3 10*3/uL (ref 0.7–4.0)
MCH: 33.9 pg (ref 26.0–34.0)
MCHC: 35.4 g/dL (ref 30.0–36.0)
MCV: 95.6 fL (ref 80.0–100.0)
Monocytes Absolute: 0.9 10*3/uL (ref 0.1–1.0)
Monocytes Relative: 5 %
Neutro Abs: 16.5 10*3/uL — ABNORMAL HIGH (ref 1.7–7.7)
Neutrophils Relative %: 86 %
Platelets: 330 10*3/uL (ref 150–400)
RBC: 3.19 MIL/uL — ABNORMAL LOW (ref 4.22–5.81)
RDW: 16.9 % — ABNORMAL HIGH (ref 11.5–15.5)
WBC: 19.2 10*3/uL — ABNORMAL HIGH (ref 4.0–10.5)
nRBC: 0 % (ref 0.0–0.2)

## 2022-09-19 LAB — C-REACTIVE PROTEIN: CRP: 6.8 mg/dL — ABNORMAL HIGH (ref <1.0)

## 2022-09-19 LAB — COMPREHENSIVE METABOLIC PANEL
ALT: 47 U/L — ABNORMAL HIGH (ref 0–44)
AST: 52 U/L — ABNORMAL HIGH (ref 15–41)
Albumin: 1.6 g/dL — ABNORMAL LOW (ref 3.5–5.0)
Alkaline Phosphatase: 478 U/L — ABNORMAL HIGH (ref 38–126)
Anion gap: 10 (ref 5–15)
BUN: 14 mg/dL (ref 8–23)
CO2: 18 mmol/L — ABNORMAL LOW (ref 22–32)
Calcium: 8.3 mg/dL — ABNORMAL LOW (ref 8.9–10.3)
Chloride: 105 mmol/L (ref 98–111)
Creatinine, Ser: 1.3 mg/dL — ABNORMAL HIGH (ref 0.61–1.24)
GFR, Estimated: >60 mL/min (ref >60)
Glucose, Bld: 132 mg/dL — ABNORMAL HIGH (ref 70–99)
Potassium: 3.3 mmol/L — ABNORMAL LOW (ref 3.5–5.1)
Sodium: 133 mmol/L — ABNORMAL LOW (ref 135–145)
Total Bilirubin: 16.2 mg/dL — ABNORMAL HIGH (ref 0.3–1.2)
Total Protein: 4.7 g/dL — ABNORMAL LOW (ref 6.5–8.1)

## 2022-09-19 LAB — BRAIN NATRIURETIC PEPTIDE: B Natriuretic Peptide: 656.2 pg/mL — ABNORMAL HIGH (ref 0.0–100.0)

## 2022-09-19 LAB — MAGNESIUM: Magnesium: 2 mg/dL (ref 1.7–2.4)

## 2022-09-19 MED ORDER — CARVEDILOL 3.125 MG PO TABS: 3.1250 mg | ORAL_TABLET | Freq: Two times a day (BID) | ORAL | 0 refills | Status: DC

## 2022-09-19 MED ORDER — ISOSORBIDE MONONITRATE ER 30 MG PO TB24: 15.0000 mg | ORAL_TABLET | Freq: Every day | ORAL | 0 refills | Status: DC

## 2022-09-19 MED ORDER — POTASSIUM CHLORIDE 20 MEQ PO PACK
40.0000 meq | PACK | Freq: Two times a day (BID) | ORAL | Status: DC
  Administered 2022-09-19: 40 meq via ORAL
  Filled 2022-09-19: qty 2

## 2022-09-19 MED ORDER — PANTOPRAZOLE SODIUM 40 MG PO TBEC: 40.0000 mg | DELAYED_RELEASE_TABLET | Freq: Every day | ORAL | 0 refills | Status: DC

## 2022-09-19 MED ORDER — ORAL CARE MOUTH RINSE: 15.0000 mL | OROMUCOSAL | Status: DC | PRN

## 2022-09-19 NOTE — Discharge Instructions (Addendum)
Follow with Primary MD your cardiologist and recommended gastroenterologist in 7 days   Get CBC, CMP, Magnesium -  checked next visit with your primary MD    Activity: As tolerated with Full fall precautions use walker/cane & assistance as needed  Disposition Home   Diet: Heart Healthy low carbohydrate diet with 1.5 L fluid restriction per day.   Special Instructions: If you have smoked or chewed Tobacco  in the last 2 yrs please stop smoking, stop any regular Alcohol  and or any Recreational drug use.  On your next visit with your primary care physician please Get Medicines reviewed and adjusted.  Please request your Prim.MD to go over all Hospital Tests and Procedure/Radiological results at the follow up, please get all Hospital records sent to your Prim MD by signing hospital release before you go home.  If you experience worsening of your admission symptoms, develop shortness of breath, life threatening emergency, suicidal or homicidal thoughts you must seek medical attention immediately by calling 911 or calling your MD immediately  if symptoms less severe.  You Must read complete instructions/literature along with all the possible adverse reactions/side effects for all the Medicines you take and that have been prescribed to you. Take any new Medicines after you have completely understood and accpet all the possible adverse reactions/side effects.

## 2022-09-19 NOTE — TOC Transition Note (Signed)
Transition of Care [facility]) - CM/SW Discharge Note   Patient Details  Name: [name] [name] MRN: [id number] Date of Birth: 04-03-1956  [facility] CM/SW Contact:  Bartholomew Crews, RN Phone Number: [phone] 09/19/2022, 12:08 PM   Clinical Narrative:     Spoke with patient on hospital room phone to discuss post acute transition. Patient agreeable to [facility] PT/OT - referral accepted by [facility] [employer] to deliver RW to his room prior to discharge. Patient to call his daughter to provide transportation home. No further TOC needs identified at this time.   Final next level of care: Home w Home Health Services Barriers to Discharge: No Barriers Identified   Patient Goals and CMS Choice CMS Medicare.gov Compare Post Acute Care list provided to:: Patient Choice offered to / list presented to : Patient  Discharge Placement                         Discharge Plan and Services Additional resources added to the After Visit Summary for                  DME Arranged: Walker rolling DME Agency: [employer] Date DME Agency Contacted: 09/19/22 Time DME Agency Contacted: 1207 Representative spoke with at DME Agency: [place]: PT, OT [place] Agency: [place] Date [place]: 09/19/22 Time [place]: 1208 Representative spoke with at [place]: [place] Determinants of Health ([place]) Interventions SDOH Screenings   Food Insecurity: Food Insecurity Present (09/18/2022)  Tobacco Use: High Risk (09/17/2022)     Readmission Risk Interventions     No data to display

## 2022-09-19 NOTE — Progress Notes (Signed)
   09/19/22 0300  Vital Signs  Temp  (PT refused all vitals and weight)

## 2022-09-19 NOTE — Discharge Summary (Signed)
[name] [name] IOE:[id number] DOB: 1956/09/09 DOA: 09/15/2022  PCP: Pcp, No  Admit date: 09/15/2022  Discharge date: 09/19/2022  Admitted From: Home   Disposition:  Home   Recommendations for Outpatient Follow-up:   Follow up with PCP in 1-2 weeks  PCP Please obtain BMP/CBC, 2 view CXR in 1week,  (see Discharge instructions)   PCP Please follow up on the following pending results: Blood pressure, CBC, CMP and magnesium levels closely.  Needs close outpatient follow-up with his cardiologist and [facility].   Home Health: PT, OT   Equipment/Devices: walker  Consultations: GI Discharge Condition: Stable    CODE STATUS: Full    Diet Recommendation: Heart Healthy low carbohydrate with strict 1.5 L fluid restriction.    Chief Complaint  Patient presents with   Abnormal Lab     Brief history of present illness from the day of admission and additional interim summary    66 year old African-American male history of nonischemic cardiomyopathy status post ICD placement, chronic systolic heart failure with an EF of 25%, chronic tobacco abuse, hypertension who presents to the ER from urgent care due to abnormal labs. He was seen in urgent care on the 19th due to 2 weeks of diarrhea. He had lab was drawn in urgent care. It revealed a sodium of 134, potassium 2.9, creatinine 1.65, bilirubin of 42, alk phos of 86.                                                                  Hospital Course   Hyperbilirubinemia, severe jaundice with unintentional weight loss.  Imaging of the abdomen pelvis suggestive of CBD stricture/stone along with sigmoid colon thickening, patient does not have pain, unfortunately he has AICD which is not MRI compatible.  GI will be consulted, he underwent ERCP on 09/17/2022 showing CBD stricture  requiring stent placement, biopsies obtained, suspicious for malignancy.  Clinically improving signs of active infection, received 3 days of empiric IV antibiotics, cultures negative, he is feeling a whole lot better and eager to go home.  Will be discharged with outpatient follow-up with [facility] on the close basis follow-up on biopsy results, will require possible EUS along with colonoscopy.  Request his PCP to closely monitor him clinically make sure he follows with GI on a close basis along with his cardiologist.   AKI (acute kidney injury) ([place])  Hold diuretics, Entresto. Continue with gentle IVF.  Renal function has improved, PCP to continue to monitor.  Blood pressure still quite low cannot tolerate all his home medications at this time.   Hypokalemia - replaced at her discharge.   Nonischemic cardiomyopathy with chronic systolic heart failure EF 25% on recent echo, s/p AICD in place.  On admission was severely hypotensive and was in AKI.  Beta-blocker, ACE/ARB, Entresto and diuretics  held.  Initially hydrated and then fluid restricted as he subsequently developed some SIADH on day 3-4 of hospitalization, AKI has improved, blood pressures have improved somewhat but only tolerating low-dose beta-blocker and Imdur at this time.  Other medications held.  PCP to monitor closely blood pressure, CMP and CBC.   Essential hypertension, note his blood pressure was extremely low with AKI upon admission, blood pressure medications adjusted, PCP to monitor and adjust.  He has lost a lot of weight and is cachectic due to #1 above, may require close monitoring of blood pressure and adjustment of blood pressure medications.   Hyponatremia.  Due to SIADH improved with fluid restriction.  Discharge diagnosis     Principal Problem:   Hyperbilirubinemia Active Problems:   Hypokalemia   Common bile duct dilatation   AKI (acute kidney injury) ([place])   Essential hypertension, benign   Chronic systolic CHF  (congestive heart failure) ([facility])   NICM (nonischemic cardiomyopathy) ([place])   ICD (implantable cardioverter-defibrillator) in place   Common bile duct (CBD) stricture   Abnormal CT scan, colon    Discharge instructions    Discharge Instructions     Ambulatory Referral for Lung Cancer Scre   Complete by: As directed    Discharge instructions   Complete by: As directed    Follow with Primary MD your cardiologist and recommended gastroenterologist in 7 days   Get CBC, CMP, Magnesium -  checked next visit with your primary MD    Activity: As tolerated with Full fall precautions use walker/cane & assistance as needed  Disposition Home   Diet: Heart Healthy low carbohydrate diet with 1.5 L fluid restriction per day.  Special Instructions: If you have smoked or chewed Tobacco  in the last 2 yrs please stop smoking, stop any regular Alcohol  and or any Recreational drug use.  On your next visit with your primary care physician please Get Medicines reviewed and adjusted.  Please request your Prim.MD to go over all Hospital Tests and Procedure/Radiological results at the follow up, please get all Hospital records sent to your Prim MD by signing hospital release before you go home.  If you experience worsening of your admission symptoms, develop shortness of breath, life threatening emergency, suicidal or homicidal thoughts you must seek medical attention immediately by calling 911 or calling your MD immediately  if symptoms less severe.  You Must read complete instructions/literature along with all the possible adverse reactions/side effects for all the Medicines you take and that have been prescribed to you. Take any new Medicines after you have completely understood and accpet all the possible adverse reactions/side effects.   Increase activity slowly   Complete by: As directed        Discharge Medications   Allergies as of 09/19/2022   No Known Allergies      Medication List      STOP taking these medications    Entresto 97-103 MG Generic drug: sacubitril-valsartan   isosorbide-hydrALAZINE 20-37.5 MG tablet Commonly known as: BiDil   metFORMIN 500 MG 24 hr tablet Commonly known as: Glucophage XR   spironolactone 50 MG tablet Commonly known as: ALDACTONE       TAKE these medications    aspirin EC 81 MG tablet TAKE 1 TABLET(81 MG) BY MOUTH DAILY. SWALLOW WHOLE What changed:  how much to take how to take this when to take this   carvedilol 3.125 MG tablet Commonly known as: COREG Take 1 tablet (3.125 mg total) by mouth 2 (  two) times daily with a meal. What changed:  medication strength how much to take   dapagliflozin propanediol 10 MG Tabs tablet Commonly known as: Farxiga Take 1 tablet (10 mg total) by mouth daily before breakfast.   digoxin 0.125 MG tablet Commonly known as: LANOXIN Take 1 tablet (0.125 mg total) by mouth daily.   ezetimibe 10 MG tablet Commonly known as: ZETIA TAKE 1 TABLET(10 MG) BY MOUTH DAILY What changed:  how much to take how to take this when to take this additional instructions   isosorbide mononitrate 30 MG 24 hr tablet Commonly known as: IMDUR Take 0.5 tablets (15 mg total) by mouth daily. Start taking on: September 20, 2022   pantoprazole 40 MG tablet Commonly known as: PROTONIX Take 1 tablet (40 mg total) by mouth daily. Start taking on: September 20, 2022   rosuvastatin 40 MG tablet Commonly known as: CRESTOR TAKE 1 TABLET(40 MG) BY MOUTH DAILY What changed:  how much to take how to take this when to take this additional instructions   triamcinolone ointment 0.5 % Commonly known as: KENALOG Apply 1 application topically 2 (two) times daily.               Durable Medical Equipment  (From admission, onward)           Start     Ordered   09/19/22 1045  For home use only DME Walker rolling  Once       Comments: 5 wheel  Question Answer Comment  Walker: With 5 Inch Wheels    Patient needs a walker to treat with the following condition Weakness      09/19/22 1044             Follow-up Information     [facility]. Schedule an appointment as soon as possible for a visit in 1 week(s).   Contact information: [place] [address] [phone]        Jackquline Denmark, MD. Schedule an appointment as soon as possible for a visit in 1 week(s).   Specialties: Gastroenterology, Internal Medicine Contact information: [address] [place]. [place] [address] [phone]                 Major procedures and Radiology Reports - PLEASE review detailed and final reports thoroughly  -        DG ERCP  Result Date: 09/17/2022 CLINICAL DATA:  Biliary ductal dilation. EXAM: ERCP COMPARISON:  CT AP, 09/15/2022.  US Abdomen, 09/15/2022. FLUOROSCOPY: Exposure Index (as provided by the fluoroscopic device): 28.3 mGy Kerma FINDINGS: Multiple, limited oblique planar images of the RIGHT upper quadrant obtained C-arm. Images demonstrating flexible endoscopy, biliary duct cannulation, sphincterotomy, retrograde cholangiogram and plastic biliary stent placement. Intrahepatic biliary ductal dilation is demonstrated. IMPRESSION: Fluoroscopic imaging for ERCP and plastic biliary stent placement. For complete description of intra procedural findings, please see performing service dictation. Electronically Signed   By: Michaelle Birks M.D.   On: 09/17/2022 17:28   DG C-Arm 1-60 Min-No Report  Result Date: 09/17/2022 Fluoroscopy was utilized by the requesting physician.  No radiographic interpretation.   CT ABDOMEN PELVIS WO CONTRAST  Result Date: 09/15/2022 CLINICAL DATA:  Left-sided abdominal pain, diarrhea EXAM: CT ABDOMEN AND PELVIS WITHOUT CONTRAST TECHNIQUE: Multidetector CT imaging of the abdomen and pelvis was performed following the standard protocol without IV contrast. RADIATION DOSE  REDUCTION: This exam was performed according to the departmental dose-optimization program  which includes automated exposure control, adjustment of the mA and/or kV according to patient size and/or use of iterative reconstruction technique. COMPARISON:  Abdomen sonogram done earlier today, CT abdomen done on 11/27/2004. FINDINGS: Lower chest: Pacer leads are noted in place. Visualized lower lung fields are clear. Hepatobiliary: There is abnormal dilation of intrahepatic and extrahepatic bile ducts. Distal common bile duct in the head of the pancreas measures 14 mm. Gallbladder is distended. There is no wall thickening in gallbladder. There is no fluid around the gallbladder. Pancreas: No focal abnormalities are seen in pancreas. Evaluation is limited in this noncontrast study. Spleen: Unremarkable. Adrenals/Urinary Tract: Adrenals are unremarkable. There is no hydronephrosis. Small calcifications are seen in left renal artery branches. There are no demonstrable renal or ureteral stones. Urinary bladder is not distended. Stomach/Bowel: Stomach is not distended. Small bowel loops are not dilated. Appendix is not distinctly seen. There is no pericecal inflammation. There is abnormal wall thickening in sigmoid colon measuring up to 2.4 cm in thickness. Vascular/Lymphatic: There are calcifications in abdominal aorta and its major branches. There is aneurysmal dilation of infrarenal aorta measuring 4.6 x 4.1 cm. Evaluation of lumen is limited in this noncontrast study. There is aneurysmal dilation of right common iliac artery measuring 2.4 cm. There is aneurysmal dilation in left common iliac artery measuring 2.3 cm. There is no demonstrable retroperitoneal hematoma. No significant lymphadenopathy is seen. Reproductive: Prostate is enlarged. Other: There is no ascites or pneumoperitoneum. Umbilical hernia containing fat is seen. Musculoskeletal: Degenerative changes are noted in lumbar spine with disc space narrowing,  bony spurs, facet hypertrophy and encroachment of neural foramina at multiple levels, more so at L4-L5 and L5-S1 levels. IMPRESSION: There is no evidence of intestinal obstruction or pneumoperitoneum. There is no hydronephrosis. There is abnormal dilation of intrahepatic and extrahepatic bile ducts. Distal common bile duct measures 1.4 cm. Evaluation of pancreas in the region of ampulla is limited in this noncontrast study. Findings may be related to noncalcified calculus in the distal common bile duct or stricture in the distal common bile duct. Follow-up MRI and MRCP may be considered. Distention of gallbladder may be due to stricture in distal common bile duct and possibly fasting state of the patient. There is abnormal wall thickening in sigmoid colon. Possibility of malignant neoplastic process in sigmoid colon is not excluded. Endoscopic correlation should be considered. There is aneurysmal dilation of infrarenal abdominal aorta measuring 4.6 cm in diameter. There is aneurysmal dilation of both common iliac arteries measuring up to 2.4 cm. Recommend follow-up every 6 months and vascular consultation.Reference: J Am Coll Radiol 0277;[id number]. Enlarged prostate.  Lumbar spondylosis. Other findings as described in the body of the report. Electronically Signed   By: Elmer Picker M.D.   On: 09/15/2022 21:45   US Abdomen Limited RUQ (LIVER/GB)  Result Date: 09/15/2022 CLINICAL DATA:  Pain right upper quadrant EXAM: ULTRASOUND ABDOMEN LIMITED RIGHT UPPER QUADRANT COMPARISON:  None Available. FINDINGS: Gallbladder: There are no demonstrable gallbladder stones. Technologist did not observe any tenderness over the gallbladder. There are low-level echoes in the lumen suggesting possible sludge. There is no fluid around the gallbladder. Common bile duct: Diameter: Common bile duct measures 1.5 cm. There is dilation of intrahepatic bile ducts. Distal common bile duct is obscured by bowel gas. Liver: There is  increased echogenicity in liver. No focal abnormalities are seen in the visualized portions of liver. Portal vein is patent on color Doppler imaging with normal direction of blood flow towards the liver.  Other: None. IMPRESSION: Sludge is seen in the lumen of gallbladder. There are no demonstrable gallbladder stones or imaging signs of acute cholecystitis. There is dilation of intrahepatic and visualized proximal extrahepatic bile ducts. Proximal common bile duct measures 1.5 cm in diameter. This may suggest stricture or calculus in the distal common bile duct. Distal common bile duct is obscured by bowel gas and not evaluated. If clinically warranted, follow-up CT may be considered. Electronically Signed   By: Elmer Picker M.D.   On: 09/15/2022 18:28    Micro Results     Recent Results (from the past 240 hour(s))  C Difficile Quick Screen w PCR reflex     Status: None   Collection Time: 09/16/22  1:56 PM   Specimen: Stool  Result Value Ref Range Status   C Diff antigen NEGATIVE NEGATIVE Final   C Diff toxin NEGATIVE NEGATIVE Final   C Diff interpretation No C. difficile detected.  Final    Comment: Performed at [facility], [place] [address], [place], [place] [address]  Gastrointestinal Panel by PCR , Stool     Status: None   Collection Time: 09/17/22 12:30 PM  Result Value Ref Range Status   Campylobacter species NOT DETECTED NOT DETECTED Final   Plesimonas shigelloides NOT DETECTED NOT DETECTED Final   Salmonella species NOT DETECTED NOT DETECTED Final   Yersinia enterocolitica NOT DETECTED NOT DETECTED Final   Vibrio species NOT DETECTED NOT DETECTED Final   Vibrio cholerae NOT DETECTED NOT DETECTED Final   Enteroaggregative E coli (EAEC) NOT DETECTED NOT DETECTED Final   Enteropathogenic E coli (EPEC) NOT DETECTED NOT DETECTED Final   Enterotoxigenic E coli (ETEC) NOT DETECTED NOT DETECTED Final   Shiga like toxin producing E coli (STEC) NOT DETECTED NOT DETECTED Final    Shigella/Enteroinvasive E coli (EIEC) NOT DETECTED NOT DETECTED Final   Cryptosporidium NOT DETECTED NOT DETECTED Final   Cyclospora cayetanensis NOT DETECTED NOT DETECTED Final   Entamoeba histolytica NOT DETECTED NOT DETECTED Final   Giardia lamblia NOT DETECTED NOT DETECTED Final   Adenovirus F40/41 NOT DETECTED NOT DETECTED Final   Astrovirus NOT DETECTED NOT DETECTED Final   Norovirus GI/GII NOT DETECTED NOT DETECTED Final   Rotavirus A NOT DETECTED NOT DETECTED Final   Sapovirus (I, II, IV, and V) NOT DETECTED NOT DETECTED Final    Comment: Performed at [facility], [address] [place], [place] [address]    Today   Subjective    [name] [name] today has no headache,no chest abdominal pain,no new weakness tingling or numbness, feels much better wants to go home today.     Objective   Blood pressure 126/77, pulse 76, temperature (!) 97.3 F (36.3 C), temperature source Oral, resp. rate 18, height 6' 1" (1.854 m), weight 63.8 kg, SpO2 97 %.  No intake or output data in the 24 hours ending 09/19/22 1045  Exam  Awake Alert, No new F.N deficits,    Mariano Colon.AT, improving scleral icterus Supple Neck,   Symmetrical Chest wall movement, Good air movement bilaterally, CTAB RRR,No Gallops,   +ve B.Sounds, Abd Soft, Non tender,  No Cyanosis, Clubbing or edema    Data Review   Recent Labs  Lab 09/14/22 1915 09/15/22 2136 09/15/22 2208 09/17/22 0633 09/17/22 1311 09/18/22 0612 09/19/22 0202  WBC 16.7* 18.7*  --  14.7*  --  15.8* 19.2*  HGB 14.6 13.9 15.6 12.2* 13.9 11.2* 10.8*  HCT 40.3 40.4 46.0 33.8* 41.0 30.6* 30.5*  PLT 421* 377  --  328  --  321 330  MCV 92.6 95.7  --  92.6  --  93.3 95.6  MCH 33.6 32.9  --  33.4  --  34.1* 33.9  MCHC 36.2* 34.4  --  36.1*  --  36.6* 35.4  RDW 17.2* 17.2*  --  16.9*  --  16.8* 16.9*  LYMPHSABS 1.1 1.0  --  1.1  --  0.9 1.3  MONOABS 1.0 1.1*  --  1.0  --  0.5 0.9  EOSABS 0.0 0.0  --  0.0  --  0.0 0.0  BASOSABS 0.0 0.0   --  0.1  --  0.0 0.0    Recent Labs  Lab 09/14/22 1915 09/15/22 2136 09/15/22 2208 09/16/22 0100 09/16/22 0647 09/16/22 0700 09/16/22 1344 09/17/22 0633 09/17/22 1311 09/18/22 0612 09/19/22 0202  NA 134* 136   < >  --   --   --  136 136 137 132* 133*  K 2.9* 2.7*   < >  --   --   --  3.1* 2.8* 3.2* 3.5 3.3*  CL 94* 98   < >  --   --   --  103 103 102 104 105  CO2 22 24  --   --   --   --  25 24  --  17* 18*  ANIONGAP 18* 14  --   --   --   --  8 9  --  11 10  GLUCOSE 123* 132*   < >  --   --   --  103* 80 98 136* 132*  BUN 13 16   < >  --   --   --  _0 CREATININE 1.65* 1.74*   < >  --   --   --  1.25* 1.30* 1.00 1.16 1.30*  AST 133* 128*  --   --   --   --   --  109*  --  86* 52*  ALT 77* 75*  --   --   --   --   --  64*  --  61* 47*  ALKPHOS 861* 790*  --   --   --   --   --  634*  --  616* 478*  BILITOT 42.0* 42.0*  --   --   --   --   --  33.5*  --  27.8* 16.2*  ALBUMIN 2.2* 2.1*  --   --   --   --   --  1.7*  --  1.7* 1.6*  CRP  --   --   --   --   --   --   --  22.4*  --  17.0* 6.8*  LATICACIDVEN  --  2.3*  --  2.0*  --   --   --   --   --   --   --   INR  --   --   --   --   --  2.2*  --   --   --  1.2  --   AMMONIA  --   --   --  55*  --   --   --   --   --   --   --   BNP  --   --   --   --  215.3*  --   --  391.9*  --  879.6* 656.2*  MG  --   --   --  2.2  --   --   --  2.2  --  2.0 2.0  CALCIUM 9.4 9.2  --   --   --   --  9.0 8.8*  --  8.2* 8.3*   < > = values in this interval not displayed.     Total Time in preparing paper work, data evaluation and todays exam - 35 minutes  Signature  -    Lala Lund M.D on 09/19/2022 at 10:45 AM   -  To page go to [email or web address]

## 2022-09-19 NOTE — Progress Notes (Signed)
Reviewed DC instructions with pt and copy given. Follow up appointments reviewed and pt understands. Reviewed med changes and pt has schedule for the next dose to take. Pt is to received walker and then he can go home. Ride aware. [facility] set up with [facility].

## 2022-09-19 NOTE — Progress Notes (Signed)
Pt discharged to home accompanied by dtr. Pt had received his walker for home use.

## 2022-09-20 ENCOUNTER — Encounter (HOSPITAL_COMMUNITY): Payer: Self-pay | Admitting: Gastroenterology

## 2022-09-21 ENCOUNTER — Telehealth: Payer: Self-pay

## 2022-09-21 NOTE — Telephone Encounter (Signed)
Left message for pt to call back  °

## 2022-09-21 NOTE — Anesthesia Postprocedure Evaluation (Signed)
Anesthesia Post Note  Patient: [name] [name]  Procedure(s) Performed: ENDOSCOPIC RETROGRADE CHOLANGIOPANCREATOGRAPHY (ERCP) SPHINCTEROTOMY BILIARY STENT PLACEMENT BILIARY BRUSHING     Patient location during evaluation: PACU Anesthesia Type: General Level of consciousness: awake and alert Pain management: pain level controlled Vital Signs Assessment: post-procedure vital signs reviewed and stable Respiratory status: spontaneous breathing, nonlabored ventilation, respiratory function stable and patient connected to nasal cannula oxygen Cardiovascular status: blood pressure returned to baseline and stable Postop Assessment: no apparent nausea or vomiting Anesthetic complications: no   No notable events documented.  Last Vitals:  Vitals:   09/18/22 1933 09/19/22 0831  BP: 113/65 126/77  Pulse: 76 76  Resp: 17 18  Temp:  (!) 36.3 C  SpO2: 100% 97%    Last Pain:  Vitals:   09/19/22 0831  TempSrc: Oral  PainSc:                  [name]

## 2022-09-21 NOTE — Telephone Encounter (Signed)
-----   Message from [place], DO sent at 09/18/2022 12:30 PM EST ----- I anticipate this patient will be discharged in the next day or so.  Can you please coordinate for follow-up in the GI clinic with me, Anderson Malta, or one of the other APP's for hospital follow-up.  Will eventually need outpatient colonoscopy, but will determine if/when based on pending pathology from this admission.  Thank you.

## 2022-09-22 ENCOUNTER — Ambulatory Visit: Payer: Medicare Other | Admitting: Family Medicine

## 2022-09-22 ENCOUNTER — Ambulatory Visit (HOSPITAL_BASED_OUTPATIENT_CLINIC_OR_DEPARTMENT_OTHER)
Admission: RE | Admit: 2022-09-22 | Discharge: 2022-09-22 | Disposition: A | Discharge status: Home / Self Care | Payer: Medicare Other | Source: Ambulatory Visit | Attending: Family Medicine | Admitting: Family Medicine

## 2022-09-22 ENCOUNTER — Encounter: Payer: Self-pay | Admitting: Family Medicine

## 2022-09-22 VITALS — BP 111/64 | HR 82 | Temp 98.1°F | Resp 16 | Ht 73.0 in | Wt 137.4 lb

## 2022-09-22 DIAGNOSIS — I10 Essential (primary) hypertension: Secondary | ICD-10-CM | POA: Diagnosis not present

## 2022-09-22 DIAGNOSIS — Z7689 Persons encountering health services in other specified circumstances: Secondary | ICD-10-CM

## 2022-09-22 DIAGNOSIS — Z09 Encounter for follow-up examination after completed treatment for conditions other than malignant neoplasm: Secondary | ICD-10-CM

## 2022-09-22 DIAGNOSIS — I5022 Chronic systolic (congestive) heart failure: Secondary | ICD-10-CM

## 2022-09-22 DIAGNOSIS — E785 Hyperlipidemia, unspecified: Secondary | ICD-10-CM

## 2022-09-22 DIAGNOSIS — E878 Other disorders of electrolyte and fluid balance, not elsewhere classified: Secondary | ICD-10-CM | POA: Diagnosis not present

## 2022-09-22 DIAGNOSIS — K831 Obstruction of bile duct: Secondary | ICD-10-CM

## 2022-09-22 LAB — COMPREHENSIVE METABOLIC PANEL
ALT: 28 U/L (ref 0–53)
AST: 29 U/L (ref 0–37)
Albumin: 2.9 g/dL — ABNORMAL LOW (ref 3.5–5.2)
Alkaline Phosphatase: 393 U/L — ABNORMAL HIGH (ref 39–117)
BUN: 8 mg/dL (ref 6–23)
CO2: 25 mEq/L (ref 19–32)
Calcium: 8.3 mg/dL — ABNORMAL LOW (ref 8.4–10.5)
Chloride: 107 mEq/L (ref 96–112)
Creatinine, Ser: 0.97 mg/dL (ref 0.40–1.50)
GFR: 81.01 mL/min (ref >60.00)
Glucose, Bld: 113 mg/dL — ABNORMAL HIGH (ref 70–99)
Potassium: 3.5 mEq/L (ref 3.5–5.1)
Sodium: 139 mEq/L (ref 135–145)
Total Bilirubin: 14 mg/dL — ABNORMAL HIGH (ref 0.2–1.2)
Total Protein: 4.8 g/dL — ABNORMAL LOW (ref 6.0–8.3)

## 2022-09-22 LAB — LIPID PANEL
Cholesterol: 234 mg/dL — ABNORMAL HIGH (ref 0–200)
HDL: 14.8 mg/dL — ABNORMAL LOW (ref >39.00)
LDL Cholesterol: 192 mg/dL — ABNORMAL HIGH (ref 0–99)
NonHDL: 219.68
Total CHOL/HDL Ratio: 16
Triglycerides: 139 mg/dL (ref 0.0–149.0)
VLDL: 27.8 mg/dL (ref 0.0–40.0)

## 2022-09-22 LAB — CANCER ANTIGEN 19-9: CA 19-9: 2 U/mL (ref 0–35)

## 2022-09-22 LAB — CBC WITH DIFFERENTIAL/PLATELET
Basophils Absolute: 0.1 10*3/uL (ref 0.0–0.1)
Basophils Relative: 0.4 % (ref 0.0–3.0)
Eosinophils Absolute: 0.1 10*3/uL (ref 0.0–0.7)
Eosinophils Relative: 0.4 % (ref 0.0–5.0)
HCT: 31.6 % — ABNORMAL LOW (ref 39.0–52.0)
Hemoglobin: 10.8 g/dL — ABNORMAL LOW (ref 13.0–17.0)
Lymphocytes Relative: 10.4 % — ABNORMAL LOW (ref 12.0–46.0)
Lymphs Abs: 1.6 10*3/uL (ref 0.7–4.0)
MCHC: 34.2 g/dL (ref 30.0–36.0)
MCV: 100.5 fl — ABNORMAL HIGH (ref 78.0–100.0)
Monocytes Absolute: 0.8 10*3/uL (ref 0.1–1.0)
Monocytes Relative: 5.2 % (ref 3.0–12.0)
Neutro Abs: 12.7 10*3/uL — ABNORMAL HIGH (ref 1.4–7.7)
Neutrophils Relative %: 83.6 % — ABNORMAL HIGH (ref 43.0–77.0)
Platelets: 335 10*3/uL (ref 150.0–400.0)
RBC: 3.15 Mil/uL — ABNORMAL LOW (ref 4.22–5.81)
RDW: 14.4 % (ref 11.5–15.5)
WBC: 15.2 10*3/uL — ABNORMAL HIGH (ref 4.0–10.5)

## 2022-09-22 LAB — BRAIN NATRIURETIC PEPTIDE: Pro B Natriuretic peptide (BNP): 835 pg/mL — ABNORMAL HIGH (ref 0.0–100.0)

## 2022-09-22 LAB — MAGNESIUM: Magnesium: 1.9 mg/dL (ref 1.5–2.5)

## 2022-09-22 LAB — CEA: CEA: 66.7 ng/mL — ABNORMAL HIGH (ref 0.0–4.7)

## 2022-09-22 NOTE — Assessment & Plan Note (Signed)
Not currently on diuretics.  Updating labs today Schedule follow-up with cardiology as soon as possible Reinforced fluid restriction, daily weight, elevating legs, compression socks

## 2022-09-22 NOTE — Assessment & Plan Note (Signed)
Follow-up with GI - new referral placed

## 2022-09-22 NOTE — Progress Notes (Signed)
New Patient Office Visit  Subjective    Patient ID: [name] [name], male    DOB: Mar 20, 1956  Age: 66 y.o. MRN: [id number]  CC:  Chief Complaint  Patient presents with   Establish Care    HPI [name] [name] presents to establish care. He lives alone, but has local daughters that check on him regularly. He has a history of nonischemic cardiomyopathy s/p ICD placement (8786), chronic systolic heart failure with EF 20%, chronic tobacco abuse, hypertension.   He was recently admitted at [facility] from 09/15/22 to 09/19/22 for diarrhea and abnormal labs at urgent care the day prior. Imaging suggested CBD stricture and sigmoid colon thickening (unable to do MRI d/t AICD). GI was consulted and he underwent ERCP on 09/17/22 with CBD stricture requiring stent placement. Biopsies were taken, concern for malignancy (no results yet). He received 3 days of IV ABX and was discharged home with outpatient GI follow-up pending biopsy results. He had acute kidney injury during admission. Diuretics  held and he received gentle IV hydration. Potassium was also replaced. He was hypotensive during admission and only able to tolerate low-dose b-blocker and imdur at time of discharge. GI symptoms had caused him to lose weight which likely contributed as well. He was encouraged to follow-up with his cardiologist for med adjustments and monitoring.  He developed SIADH, hyponatremia which improved with fluid restriction.   Today he is accompanied by his daughter and reports he is feeling significantly better, slowly regaining his strength. He has been taking his medications as prescribed without any problems. They are planning to call cardiology and GI today to schedule follow-ups.     Outpatient Encounter Medications as of 09/22/2022  Medication Sig   aspirin 81 MG EC tablet TAKE 1 TABLET(81 MG) BY MOUTH DAILY. SWALLOW WHOLE (Patient taking differently: Take 81 mg by mouth daily. TAKE 1 TABLET(81 MG) BY MOUTH  DAILY. SWALLOW WHOLE)   carvedilol (COREG) 3.125 MG tablet Take 1 tablet (3.125 mg total) by mouth 2 (two) times daily with a meal.   isosorbide mononitrate (IMDUR) 30 MG 24 hr tablet Take 0.5 tablets (15 mg total) by mouth daily.   pantoprazole (PROTONIX) 40 MG tablet Take 1 tablet (40 mg total) by mouth daily.   dapagliflozin propanediol (FARXIGA) 10 MG TABS tablet Take 1 tablet (10 mg total) by mouth daily before breakfast. (Patient not taking: Reported on 09/22/2022)   digoxin (LANOXIN) 0.125 MG tablet Take 1 tablet (0.125 mg total) by mouth daily. (Patient not taking: Reported on 09/22/2022)   ezetimibe (ZETIA) 10 MG tablet TAKE 1 TABLET(10 MG) BY MOUTH DAILY (Patient not taking: Reported on 09/22/2022)   rosuvastatin (CRESTOR) 40 MG tablet TAKE 1 TABLET(40 MG) BY MOUTH DAILY (Patient not taking: Reported on 09/22/2022)   triamcinolone ointment (KENALOG) 0.5 % Apply 1 application topically 2 (two) times daily. (Patient not taking: Reported on 09/22/2022)   No facility-administered encounter medications on file as of 09/22/2022.    Past Medical History:  Diagnosis Date   AICD (automatic cardioverter/defibrillator) present    Chronic systolic CHF (congestive heart failure) ([place]) 01/31/2015   Coronary artery disease    Hypertension    Nonischemic cardiomyopathy ([place]) 06/30/2015    Past Surgical History:  Procedure Laterality Date   BILIARY BRUSHING  09/17/2022   Procedure: BILIARY BRUSHING;  Surgeon: Jackquline Denmark, MD;  Location: [place];  Service: Gastroenterology;;   BILIARY STENT PLACEMENT  09/17/2022   Procedure: BILIARY STENT PLACEMENT;  Surgeon: Jackquline Denmark, MD;  Location:  [place] [facility];  Service: Gastroenterology;;   CARDIAC CATHETERIZATION N/A 02/04/2015   Procedure: Right/Left Heart Cath and Coronary Angiography;  Surgeon: Larey Dresser, MD;  Location: [place] [facility];  Service: Cardiovascular;  Laterality: N/A;   CARDIAC DEFIBRILLATOR PLACEMENT  07/09/2015    CHALAZION EXCISION Left 01/16/2014   Procedure: MINOR EXCISION OF CHALAZION;  Surgeon: Myrtha Mantis., MD;  Location: [place];  Service: Ophthalmology;  Laterality: Left;   EP IMPLANTABLE DEVICE N/A 07/09/2015   Procedure: ICD Implant;  Surgeon: Deboraha Sprang, MD;  Location: [place] [facility];  Service: Cardiovascular;  Laterality: N/A;   ERCP N/A 09/17/2022   Procedure: ENDOSCOPIC RETROGRADE CHOLANGIOPANCREATOGRAPHY (ERCP);  Surgeon: Jackquline Denmark, MD;  Location: [facility];  Service: Gastroenterology;  Laterality: N/A;   EYE SURGERY Left ~ 2013   "eye infection"   SPHINCTEROTOMY  09/17/2022   Procedure: SPHINCTEROTOMY;  Surgeon: Jackquline Denmark, MD;  Location: [facility];  Service: Gastroenterology;;    Family History  Problem Relation Age of Onset   Stroke Mother    Heart Problems Other     Social History   Socioeconomic History   Marital status: Single    Spouse name: Not on file   Number of children: Not on file   Years of education: Not on file   Highest education level: Not on file  Occupational History   Not on file  Tobacco Use   Smoking status: Every Day    Packs/day: 1.00    Years: 40.00    Total pack years: 40.00    Types: Cigarettes   Smokeless tobacco: Never  Substance and Sexual Activity   Alcohol use: Not Currently    Comment: 07/09/2015 "I haven't had a drink since last of 12/2014"   Drug use: Not Currently    Types: "Crack" cocaine, Marijuana    Comment: 07/09/2015 "I did drugs a long time;ago  quit in the mid 1980's"   Sexual activity: Not Currently  Other Topics Concern   Not on file  Social History Narrative   Not on file   Social Determinants of Health   Financial Resource Strain: Not on file  Food Insecurity: Food Insecurity Present (09/18/2022)   Hunger Vital Sign    Worried About Running Out of Food in the Last Year: Never true    Ran Out of Food in the Last Year: Sometimes true  Transportation Needs: Not on  file  Physical Activity: Not on file  Stress: Not on file  Social Connections: Not on file  Intimate Partner Violence: Not on file    ROS All review of systems negative except what is listed in the HPI      Objective    BP 111/64   Pulse 82   Temp 98.1 F (36.7 C)   Resp 16   Ht '6\' 1"'$  (1.854 m)   Wt 137 lb 6.4 oz (62.3 kg)   SpO2 100%   BMI 18.13 kg/m   Physical Exam Vitals reviewed.  Constitutional:      Appearance: Normal appearance.  HENT:     Head: Normocephalic and atraumatic.  Cardiovascular:     Rate and Rhythm: Normal rate and regular rhythm.     Pulses: Normal pulses.     Heart sounds: Normal heart sounds.  Pulmonary:     Effort: Pulmonary effort is normal.     Breath sounds: Normal breath sounds.  Musculoskeletal:        General: Normal range of motion.  Cervical back: Normal range of motion and neck supple.     Comments: BLE non-pitting ankle edema  Skin:    General: Skin is warm and dry.  Neurological:     Mental Status: He is alert and oriented to person, place, and time.  Psychiatric:        Mood and Affect: Mood normal.        Behavior: Behavior normal.        Thought Content: Thought content normal.        Judgment: Judgment normal.         Assessment & Plan:   Problem List Items Addressed This Visit       Cardiovascular and Mediastinum   Essential hypertension, benign (Chronic)    Currently stable on Imdur and Coreg. Labs today Continue following with cardiology Monitor at home      Relevant Orders   CBC with Differential/Platelet   Comprehensive metabolic panel   Lipid panel   Chronic systolic CHF (congestive heart failure) (HCC) (Chronic)    Not currently on diuretics.  Updating labs today Schedule follow-up with cardiology as soon as possible Reinforced fluid restriction, daily weight, elevating legs, compression socks       Relevant Orders   DG Chest 2 View   B Nat Peptide     Digestive   Common bile duct  (CBD) stricture    Follow-up with GI - new referral placed       Relevant Orders   Ambulatory referral to Gastroenterology     Other   Dyslipidemia (Chronic)   Relevant Orders   Lipid panel   Other Visit Diagnoses     Encounter to establish care    -  Primary   Electrolyte abnormality       Relevant Orders   Comprehensive metabolic panel   Magnesium   Hospital discharge follow-up           Return in about 3 months (around 12/22/2022) for routine follow-up; sooner pending labs or if you cannot get in with cardiology soon.   Terrilyn Saver, NP

## 2022-09-22 NOTE — Patient Instructions (Addendum)
Updating your blood work today.  Referral to GI placed Let's see how labs look. If able, we may go ahead and restart diuretics since your blood pressure is okay right now, but please see if cardiology can see you soon. I will let you know when results come in.  Please weigh yourself daily and let us or cardiology know if you gain more than 2-3 pounds overnight or 5 pounds in a week.  Recommend 1.5L fluid restriction. Elevated legs when seated. Wear compression socks.  ------------------------------------------   Thank you for choosing [facility] at [facility] for your Primary Care needs. I am excited for the opportunity to partner with you to meet your health care goals. It was a pleasure meeting you today!   Information on diet, exercise, and health maintenance recommendations are listed below. This is information to help you be sure you are on track for optimal health and monitoring.   Please look over this and let us know if you have any questions or if you have completed any of the health maintenance outside of [place] so that we can be sure your records are up to date.  ___________________________________________________________  MyChart:  For all urgent or time sensitive needs we ask that you please call the office to avoid delays. Our number is [phone]. MyChart is not constantly monitored and due to the large volume of messages a day, replies may take up to 72 business hours.  MyChart Policy: MyChart allows for you to see your visit notes, after visit summary, provider recommendations, lab and tests results, make an appointment, request refills, and contact your provider or the office for non-urgent questions or concerns. Providers are seeing patients during normal business hours and do not have built in time to review MyChart messages.  We ask that you allow a minimum of 3 business days for responses to [employer]. For this reason, please do not  send urgent requests through [place]. Please call the office at [phone]. New and ongoing conditions may require a visit. We have virtual and in-person visits available for your convenience.  Complex MyChart concerns may require a visit. Your provider may request you schedule a virtual or in-person visit to ensure we are providing the best care possible. MyChart messages sent after 11:00 AM on Friday will not be received by the provider until Monday morning.    Lab and Test Results: You will receive your lab and test results on MyChart as soon as they are completed and results have been sent by the lab or testing facility. Due to this service, you will receive your results BEFORE your provider.  I review lab and test results each morning prior to seeing patients. Some results require collaboration with other providers to ensure you are receiving the most appropriate care. For this reason, we ask that you please allow a minimum of 3-5 business days from the time that ALL results have been received for your provider to receive and review lab and test results and contact you about these.  Most lab and test result comments from the provider will be sent through [place]. Your provider may recommend changes to the plan of care, follow-up visits, repeat testing, ask questions, or request an office visit to discuss these results. You may reply directly to this message or call the office to provide information for the provider or set up an appointment. In some instances, you will be called with test results and recommendations. Please let us  know if this is preferred and we will make note of this in your chart to provide this for you.    If you have not heard a response to your lab or test results in 5 business days from all results returning to [place], please call the office to let us know. We ask that you please avoid calling prior to this time unless there is an emergent concern. Due to high call volumes,  this can delay the resulting process.  After Hours: For all non-emergency after hours needs, please call the office at [phone] and select the option to reach the on-call  service. On-call services are shared between multiple [place] offices and therefore it will not be possible to speak directly with your provider. On-call providers may provide medical advice and recommendations, but are unable to provide refills for maintenance medications.  For all emergency or urgent medical needs after normal business hours, we recommend that you seek care at the closest Urgent Care or Emergency Department to ensure appropriate treatment in a timely manner.  [facility] [place] has a 24 hour emergency room located on the ground floor for your convenience.   Urgent Concerns During the Business Day Providers are seeing patients from 8AM to [place] with a busy schedule and are most often not able to respond to non-urgent calls until the end of the day or the next business day. If you should have URGENT concerns during the day, please call and speak to the nurse or schedule a same day appointment so that we can address your concern without delay.   Thank you, again, for choosing me as your health care partner. I appreciate your trust and look forward to learning more about you.   [employer]cell [employer] Olevia Bowens, DNP, FNP-C  ___________________________________________________________  Health Maintenance Recommendations Screening Testing Mammogram Every 1-2 years based on history and risk factors Starting at age 39 Pap Smear Ages 21-39 every 3 years Ages 71-65 every 5 years with HPV testing More frequent testing may be required based on results and history Colon Cancer Screening Every 1-10 years based on test performed, risk factors, and history Starting at age 69 Bone Density Screening Every 2-10 years based on history Starting at age 29 for women Recommendations for men differ based on medication  usage, history, and risk factors AAA Screening One time ultrasound Men 27-20 years old who have ever smoked Lung Cancer Screening Low Dose Lung CT every 12 months Age 39-80 years with a 20 pack-year smoking history who still smoke or who have quit within the last 15 years  Screening Labs Routine  Labs: Complete Blood Count (CBC), Complete Metabolic Panel (CMP), Cholesterol (Lipid Panel) Every 6-12 months based on history and medications May be recommended more frequently based on current conditions or previous results Hemoglobin A1c Lab Every 3-12 months based on history and previous results Starting at age 61 or earlier with diagnosis of diabetes, high cholesterol, BMI >26, and/or risk factors Frequent monitoring for patients with diabetes to ensure blood sugar control Thyroid Panel (TSH w/ T3 & T4) Every 6 months based on history, symptoms, and risk factors May be repeated more often if on medication HIV One time testing for all patients 57 and older May be repeated more frequently for patients with increased risk factors or exposure Hepatitis C One time testing for all patients 62 and older May be repeated more frequently for patients with increased risk factors or exposure Gonorrhea, Chlamydia Every 12 months for all sexually  active persons 13-24 years Additional monitoring may be recommended for those who are considered high risk or who have symptoms PSA Men 70-64 years old with risk factors Additional screening may be recommended from age 63-69 based on risk factors, symptoms, and history  Vaccine Recommendations Tetanus Booster All adults every 10 years Flu Vaccine All patients 6 months and older every year COVID Vaccine All patients 12 years and older Initial dosing with booster May recommend additional booster based on age and health history HPV Vaccine 2 doses all patients age 48-26 Dosing may be considered for patients over 26 Shingles Vaccine (Shingrix) 2 doses  all adults 44 years and older Pneumonia (Pneumovax 66) All adults 60 years and older May recommend earlier dosing based on health history Pneumonia (Prevnar 20) All adults 79 years and older Dosed 1 year after Pneumovax 23 Pneumonia (Prevnar 17) All adults 21 years and older (adults 17-51 with certain conditions or risk factors) 1 dose  For those who have no received Prevnar 13 vaccine previously   Additional Screening, Testing, and Vaccinations may be recommended on an individualized basis based on family history, health history, risk factors, and/or exposure.  __________________________________________________________  Diet Recommendations for All Patients  I recommend that all patients maintain a diet low in saturated fats, carbohydrates, and cholesterol. While this can be challenging at first, it is not impossible and small changes can make big differences.  Things to try: Decreasing the amount of soda, sweet tea, and/or juice to one or less per day and replace with water While water is always the first choice, if you do not like water you may consider adding a water additive without sugar to improve the taste other sugar free drinks Replace potatoes with a brightly colored vegetable at dinner Use healthy oils, such as canola oil or olive oil, instead of butter or hard margarine Limit your bread intake to two pieces or less a day Replace regular pasta with low carb pasta options Bake, broil, or grill foods instead of frying Monitor portion sizes  Eat smaller, more frequent meals throughout the day instead of large meals  An important thing to remember is, if you love foods that are not great for your health, you don't have to give them up completely. Instead, allow these foods to be a reward when you have done well. Allowing yourself to still have special treats every once in a while is a nice way to tell yourself thank you for working hard to keep yourself healthy.   Also  remember that every day is a new day. If you have a bad day and "fall off the wagon", you can still climb right back up and keep moving along on your journey!  We have resources available to help you!  Some websites that may be helpful include: www.http://carter.biz/  Www.[employer]Well[employer].com _____________________________________________________________  Activity Recommendations for All Patients  I recommend that all adults get at least 20 minutes of moderate physical activity that elevates your heart rate at least 5 days out of the week.  Some examples include: Walking or jogging at a pace that allows you to carry on a conversation Cycling (stationary bike or outdoors) Water aerobics Yoga Weight lifting Dancing If physical limitations prevent you from putting stress on your joints, exercise in a pool or seated in a chair are excellent options.  Do determine your MAXIMUM heart rate for activity: 220 - YOUR AGE = MAX Heart Rate   Remember! Do not push yourself too hard.  Start  slowly and build up your pace, speed, weight, time in exercise, etc.  Allow your body to rest between exercise and get good sleep. You will need more water than normal when you are exerting yourself. Do not wait until you are thirsty to drink. Drink with a purpose of getting in at least 8, 8 ounce glasses of water a day plus more depending on how much you exercise and sweat.    If you begin to develop dizziness, chest pain, abdominal pain, jaw pain, shortness of breath, headache, vision changes, lightheadedness, or other concerning symptoms, stop the activity and allow your body to rest. If your symptoms are severe, seek emergency evaluation immediately. If your symptoms are concerning, but not severe, please let us know so that we can recommend further evaluation.

## 2022-09-22 NOTE — Assessment & Plan Note (Signed)
Currently stable on Imdur and Coreg. Labs today Continue following with cardiology Monitor at home

## 2022-09-23 ENCOUNTER — Other Ambulatory Visit: Payer: Self-pay | Admitting: Family Medicine

## 2022-09-23 DIAGNOSIS — C801 Malignant (primary) neoplasm, unspecified: Secondary | ICD-10-CM

## 2022-09-23 LAB — CYTOLOGY - NON PAP

## 2022-09-23 NOTE — Telephone Encounter (Signed)
Spoke with pt's daughter. Pt is scheduled for f/u appointment on 10/19/22 at 1:40 pm. Pt's daughter verbalized understanding.

## 2022-09-29 ENCOUNTER — Inpatient Hospital Stay: Payer: Medicare Other | Attending: Hematology & Oncology

## 2022-09-29 ENCOUNTER — Inpatient Hospital Stay: Payer: Medicare Other | Admitting: Licensed Clinical Social Worker

## 2022-09-29 ENCOUNTER — Encounter (HOSPITAL_COMMUNITY): Payer: Self-pay

## 2022-09-29 ENCOUNTER — Inpatient Hospital Stay (HOSPITAL_COMMUNITY)
Admission: EM | Admit: 2022-09-29 | Discharge: 2022-10-28 | DRG: 919 | Disposition: E | Payer: Medicare Other | Attending: Internal Medicine | Admitting: Internal Medicine

## 2022-09-29 ENCOUNTER — Telehealth: Payer: Self-pay | Admitting: *Deleted

## 2022-09-29 ENCOUNTER — Other Ambulatory Visit: Payer: Self-pay

## 2022-09-29 ENCOUNTER — Encounter: Payer: Self-pay | Admitting: Hematology & Oncology

## 2022-09-29 ENCOUNTER — Inpatient Hospital Stay (HOSPITAL_BASED_OUTPATIENT_CLINIC_OR_DEPARTMENT_OTHER): Payer: Medicare Other | Admitting: Hematology & Oncology

## 2022-09-29 ENCOUNTER — Inpatient Hospital Stay (HOSPITAL_COMMUNITY): Payer: Medicare Other

## 2022-09-29 ENCOUNTER — Encounter: Payer: Self-pay | Admitting: *Deleted

## 2022-09-29 VITALS — BP 106/75 | HR 94 | Temp 98.0°F | Resp 19 | Ht 73.0 in | Wt 128.4 lb

## 2022-09-29 DIAGNOSIS — I5022 Chronic systolic (congestive) heart failure: Secondary | ICD-10-CM

## 2022-09-29 DIAGNOSIS — K633 Ulcer of intestine: Secondary | ICD-10-CM | POA: Diagnosis present

## 2022-09-29 DIAGNOSIS — D6869 Other thrombophilia: Secondary | ICD-10-CM | POA: Diagnosis present

## 2022-09-29 DIAGNOSIS — E8809 Other disorders of plasma-protein metabolism, not elsewhere classified: Secondary | ICD-10-CM | POA: Diagnosis present

## 2022-09-29 DIAGNOSIS — Z515 Encounter for palliative care: Secondary | ICD-10-CM | POA: Diagnosis not present

## 2022-09-29 DIAGNOSIS — R7989 Other specified abnormal findings of blood chemistry: Secondary | ICD-10-CM | POA: Diagnosis not present

## 2022-09-29 DIAGNOSIS — B3781 Candidal esophagitis: Secondary | ICD-10-CM | POA: Diagnosis present

## 2022-09-29 DIAGNOSIS — E785 Hyperlipidemia, unspecified: Secondary | ICD-10-CM | POA: Diagnosis present

## 2022-09-29 DIAGNOSIS — F419 Anxiety disorder, unspecified: Secondary | ICD-10-CM | POA: Diagnosis present

## 2022-09-29 DIAGNOSIS — Y828 Other medical devices associated with adverse incidents: Secondary | ICD-10-CM | POA: Diagnosis present

## 2022-09-29 DIAGNOSIS — K921 Melena: Secondary | ICD-10-CM | POA: Diagnosis not present

## 2022-09-29 DIAGNOSIS — R197 Diarrhea, unspecified: Secondary | ICD-10-CM

## 2022-09-29 DIAGNOSIS — Z66 Do not resuscitate: Secondary | ICD-10-CM | POA: Diagnosis not present

## 2022-09-29 DIAGNOSIS — J9601 Acute respiratory failure with hypoxia: Secondary | ICD-10-CM | POA: Diagnosis not present

## 2022-09-29 DIAGNOSIS — K838 Other specified diseases of biliary tract: Secondary | ICD-10-CM | POA: Diagnosis not present

## 2022-09-29 DIAGNOSIS — Z79899 Other long term (current) drug therapy: Secondary | ICD-10-CM | POA: Diagnosis not present

## 2022-09-29 DIAGNOSIS — R627 Adult failure to thrive: Principal | ICD-10-CM | POA: Diagnosis present

## 2022-09-29 DIAGNOSIS — F17210 Nicotine dependence, cigarettes, uncomplicated: Secondary | ICD-10-CM | POA: Diagnosis not present

## 2022-09-29 DIAGNOSIS — R0600 Dyspnea, unspecified: Secondary | ICD-10-CM | POA: Diagnosis not present

## 2022-09-29 DIAGNOSIS — I70208 Unspecified atherosclerosis of native arteries of extremities, other extremity: Secondary | ICD-10-CM | POA: Diagnosis present

## 2022-09-29 DIAGNOSIS — I509 Heart failure, unspecified: Secondary | ICD-10-CM | POA: Diagnosis not present

## 2022-09-29 DIAGNOSIS — E43 Unspecified severe protein-calorie malnutrition: Secondary | ICD-10-CM | POA: Diagnosis present

## 2022-09-29 DIAGNOSIS — N179 Acute kidney failure, unspecified: Secondary | ICD-10-CM | POA: Diagnosis not present

## 2022-09-29 DIAGNOSIS — I739 Peripheral vascular disease, unspecified: Secondary | ICD-10-CM | POA: Diagnosis not present

## 2022-09-29 DIAGNOSIS — R970 Elevated carcinoembryonic antigen [CEA]: Secondary | ICD-10-CM

## 2022-09-29 DIAGNOSIS — R54 Age-related physical debility: Secondary | ICD-10-CM | POA: Diagnosis present

## 2022-09-29 DIAGNOSIS — C801 Malignant (primary) neoplasm, unspecified: Secondary | ICD-10-CM | POA: Diagnosis not present

## 2022-09-29 DIAGNOSIS — E861 Hypovolemia: Secondary | ICD-10-CM | POA: Diagnosis present

## 2022-09-29 DIAGNOSIS — K6389 Other specified diseases of intestine: Secondary | ICD-10-CM | POA: Diagnosis not present

## 2022-09-29 DIAGNOSIS — C241 Malignant neoplasm of ampulla of Vater: Secondary | ICD-10-CM

## 2022-09-29 DIAGNOSIS — M7989 Other specified soft tissue disorders: Secondary | ICD-10-CM | POA: Diagnosis not present

## 2022-09-29 DIAGNOSIS — I2510 Atherosclerotic heart disease of native coronary artery without angina pectoris: Secondary | ICD-10-CM | POA: Diagnosis present

## 2022-09-29 DIAGNOSIS — R64 Cachexia: Secondary | ICD-10-CM | POA: Diagnosis present

## 2022-09-29 DIAGNOSIS — A4159 Other Gram-negative sepsis: Secondary | ICD-10-CM | POA: Diagnosis not present

## 2022-09-29 DIAGNOSIS — E876 Hypokalemia: Secondary | ICD-10-CM | POA: Diagnosis not present

## 2022-09-29 DIAGNOSIS — Z95810 Presence of automatic (implantable) cardiac defibrillator: Secondary | ICD-10-CM

## 2022-09-29 DIAGNOSIS — Z9889 Other specified postprocedural states: Secondary | ICD-10-CM | POA: Diagnosis not present

## 2022-09-29 DIAGNOSIS — A419 Sepsis, unspecified organism: Secondary | ICD-10-CM

## 2022-09-29 DIAGNOSIS — E162 Hypoglycemia, unspecified: Secondary | ICD-10-CM | POA: Diagnosis not present

## 2022-09-29 DIAGNOSIS — R52 Pain, unspecified: Secondary | ICD-10-CM | POA: Diagnosis not present

## 2022-09-29 DIAGNOSIS — C221 Intrahepatic bile duct carcinoma: Secondary | ICD-10-CM

## 2022-09-29 DIAGNOSIS — C7889 Secondary malignant neoplasm of other digestive organs: Secondary | ICD-10-CM

## 2022-09-29 DIAGNOSIS — J9811 Atelectasis: Secondary | ICD-10-CM | POA: Diagnosis not present

## 2022-09-29 DIAGNOSIS — Z1152 Encounter for screening for COVID-19: Secondary | ICD-10-CM | POA: Diagnosis not present

## 2022-09-29 DIAGNOSIS — C187 Malignant neoplasm of sigmoid colon: Secondary | ICD-10-CM | POA: Diagnosis present

## 2022-09-29 DIAGNOSIS — I5020 Unspecified systolic (congestive) heart failure: Secondary | ICD-10-CM

## 2022-09-29 DIAGNOSIS — K8309 Other cholangitis: Secondary | ICD-10-CM | POA: Diagnosis present

## 2022-09-29 DIAGNOSIS — K567 Ileus, unspecified: Secondary | ICD-10-CM | POA: Diagnosis not present

## 2022-09-29 DIAGNOSIS — R4589 Other symptoms and signs involving emotional state: Secondary | ICD-10-CM

## 2022-09-29 DIAGNOSIS — R6521 Severe sepsis with septic shock: Secondary | ICD-10-CM | POA: Diagnosis not present

## 2022-09-29 DIAGNOSIS — Z823 Family history of stroke: Secondary | ICD-10-CM

## 2022-09-29 DIAGNOSIS — K831 Obstruction of bile duct: Secondary | ICD-10-CM | POA: Diagnosis present

## 2022-09-29 DIAGNOSIS — T85590A Other mechanical complication of bile duct prosthesis, initial encounter: Principal | ICD-10-CM | POA: Diagnosis present

## 2022-09-29 DIAGNOSIS — D696 Thrombocytopenia, unspecified: Secondary | ICD-10-CM | POA: Diagnosis present

## 2022-09-29 DIAGNOSIS — Z9911 Dependence on respirator [ventilator] status: Secondary | ICD-10-CM | POA: Diagnosis not present

## 2022-09-29 DIAGNOSIS — R609 Edema, unspecified: Secondary | ICD-10-CM | POA: Diagnosis not present

## 2022-09-29 DIAGNOSIS — R739 Hyperglycemia, unspecified: Secondary | ICD-10-CM | POA: Diagnosis not present

## 2022-09-29 DIAGNOSIS — I110 Hypertensive heart disease with heart failure: Secondary | ICD-10-CM

## 2022-09-29 DIAGNOSIS — Z7189 Other specified counseling: Secondary | ICD-10-CM | POA: Diagnosis not present

## 2022-09-29 DIAGNOSIS — R933 Abnormal findings on diagnostic imaging of other parts of digestive tract: Secondary | ICD-10-CM | POA: Diagnosis not present

## 2022-09-29 DIAGNOSIS — C249 Malignant neoplasm of biliary tract, unspecified: Secondary | ICD-10-CM | POA: Diagnosis not present

## 2022-09-29 DIAGNOSIS — D490 Neoplasm of unspecified behavior of digestive system: Secondary | ICD-10-CM | POA: Diagnosis not present

## 2022-09-29 DIAGNOSIS — K529 Noninfective gastroenteritis and colitis, unspecified: Secondary | ICD-10-CM | POA: Diagnosis not present

## 2022-09-29 DIAGNOSIS — D135 Benign neoplasm of extrahepatic bile ducts: Secondary | ICD-10-CM

## 2022-09-29 DIAGNOSIS — I998 Other disorder of circulatory system: Secondary | ICD-10-CM | POA: Diagnosis not present

## 2022-09-29 DIAGNOSIS — Z681 Body mass index (BMI) 19 or less, adult: Secondary | ICD-10-CM

## 2022-09-29 DIAGNOSIS — I130 Hypertensive heart and chronic kidney disease with heart failure and stage 1 through stage 4 chronic kidney disease, or unspecified chronic kidney disease: Secondary | ICD-10-CM | POA: Diagnosis present

## 2022-09-29 DIAGNOSIS — I428 Other cardiomyopathies: Secondary | ICD-10-CM | POA: Diagnosis present

## 2022-09-29 DIAGNOSIS — I4891 Unspecified atrial fibrillation: Secondary | ICD-10-CM | POA: Diagnosis present

## 2022-09-29 DIAGNOSIS — N182 Chronic kidney disease, stage 2 (mild): Secondary | ICD-10-CM | POA: Diagnosis present

## 2022-09-29 DIAGNOSIS — D849 Immunodeficiency, unspecified: Secondary | ICD-10-CM | POA: Diagnosis present

## 2022-09-29 DIAGNOSIS — K449 Diaphragmatic hernia without obstruction or gangrene: Secondary | ICD-10-CM | POA: Diagnosis not present

## 2022-09-29 DIAGNOSIS — Z7982 Long term (current) use of aspirin: Secondary | ICD-10-CM

## 2022-09-29 DIAGNOSIS — K219 Gastro-esophageal reflux disease without esophagitis: Secondary | ICD-10-CM | POA: Diagnosis present

## 2022-09-29 DIAGNOSIS — K229 Disease of esophagus, unspecified: Secondary | ICD-10-CM | POA: Diagnosis not present

## 2022-09-29 DIAGNOSIS — Z9689 Presence of other specified functional implants: Secondary | ICD-10-CM | POA: Diagnosis present

## 2022-09-29 DIAGNOSIS — T85858A Stenosis due to other internal prosthetic devices, implants and grafts, initial encounter: Secondary | ICD-10-CM | POA: Diagnosis present

## 2022-09-29 HISTORY — DX: Malignant neoplasm of ampulla of Vater: C24.1

## 2022-09-29 LAB — CBC WITH DIFFERENTIAL (CANCER CENTER ONLY)
Abs Immature Granulocytes: 0.06 10*3/uL (ref 0.00–0.07)
Basophils Absolute: 0 10*3/uL (ref 0.0–0.1)
Basophils Relative: 0 %
Eosinophils Absolute: 0 10*3/uL (ref 0.0–0.5)
Eosinophils Relative: 0 %
HCT: 33.2 % — ABNORMAL LOW (ref 39.0–52.0)
Hemoglobin: 11.5 g/dL — ABNORMAL LOW (ref 13.0–17.0)
Immature Granulocytes: 0 %
Lymphocytes Relative: 11 %
Lymphs Abs: 1.6 10*3/uL (ref 0.7–4.0)
MCH: 33.3 pg (ref 26.0–34.0)
MCHC: 34.6 g/dL (ref 30.0–36.0)
MCV: 96.2 fL (ref 80.0–100.0)
Monocytes Absolute: 0.7 10*3/uL (ref 0.1–1.0)
Monocytes Relative: 5 %
Neutro Abs: 11.9 10*3/uL — ABNORMAL HIGH (ref 1.7–7.7)
Neutrophils Relative %: 84 %
Platelet Count: 265 10*3/uL (ref 150–400)
RBC: 3.45 MIL/uL — ABNORMAL LOW (ref 4.22–5.81)
RDW: 14.8 % (ref 11.5–15.5)
WBC Count: 14.3 10*3/uL — ABNORMAL HIGH (ref 4.0–10.5)
nRBC: 0 % (ref 0.0–0.2)

## 2022-09-29 LAB — CMP (CANCER CENTER ONLY)
ALT: 39 U/L (ref 0–44)
AST: 57 U/L — ABNORMAL HIGH (ref 15–41)
Albumin: 3.1 g/dL — ABNORMAL LOW (ref 3.5–5.0)
Alkaline Phosphatase: 717 U/L — ABNORMAL HIGH (ref 38–126)
Anion gap: 11 (ref 5–15)
BUN: 13 mg/dL (ref 8–23)
CO2: 25 mmol/L (ref 22–32)
Calcium: 8.8 mg/dL — ABNORMAL LOW (ref 8.9–10.3)
Chloride: 103 mmol/L (ref 98–111)
Creatinine: 1.07 mg/dL (ref 0.61–1.24)
GFR, Estimated: >60 mL/min (ref >60)
Glucose, Bld: 124 mg/dL — ABNORMAL HIGH (ref 70–99)
Potassium: 2.9 mmol/L — ABNORMAL LOW (ref 3.5–5.1)
Sodium: 139 mmol/L (ref 135–145)
Total Bilirubin: 14.4 mg/dL (ref 0.3–1.2)
Total Protein: 6 g/dL — ABNORMAL LOW (ref 6.5–8.1)

## 2022-09-29 LAB — LACTATE DEHYDROGENASE: LDH: 191 U/L (ref 98–192)

## 2022-09-29 LAB — PREALBUMIN: Prealbumin: 6 mg/dL — ABNORMAL LOW (ref 18–38)

## 2022-09-29 LAB — LIPASE, BLOOD: Lipase: 36 U/L (ref 11–51)

## 2022-09-29 LAB — MAGNESIUM: Magnesium: 1.9 mg/dL (ref 1.7–2.4)

## 2022-09-29 LAB — CEA (IN HOUSE-CHCC): CEA (CHCC-In House): 256.75 ng/mL — ABNORMAL HIGH (ref 0.00–5.00)

## 2022-09-29 MED ORDER — PANTOPRAZOLE SODIUM 40 MG PO TBEC
40.0000 mg | DELAYED_RELEASE_TABLET | Freq: Every day | ORAL | Status: DC
  Administered 2022-09-30: 40 mg via ORAL
  Filled 2022-09-29: qty 1

## 2022-09-29 MED ORDER — SODIUM CHLORIDE 0.9% FLUSH
3.0000 mL | Freq: Two times a day (BID) | INTRAVENOUS | Status: DC
  Administered 2022-09-29 – 2022-10-14 (×25): 3 mL via INTRAVENOUS

## 2022-09-29 MED ORDER — ONDANSETRON HCL 4 MG/2ML IJ SOLN
4.0000 mg | Freq: Four times a day (QID) | INTRAMUSCULAR | Status: DC | PRN
  Administered 2022-10-01: 4 mg via INTRAVENOUS
  Filled 2022-09-29: qty 2

## 2022-09-29 MED ORDER — POTASSIUM CHLORIDE 20 MEQ PO PACK
60.0000 meq | PACK | Freq: Once | ORAL | Status: AC
  Administered 2022-09-29: 60 meq via ORAL
  Filled 2022-09-29: qty 3

## 2022-09-29 MED ORDER — ONDANSETRON HCL 4 MG PO TABS: 4.0000 mg | ORAL_TABLET | Freq: Four times a day (QID) | ORAL | Status: DC | PRN

## 2022-09-29 MED ORDER — CARVEDILOL 3.125 MG PO TABS
3.1250 mg | ORAL_TABLET | Freq: Two times a day (BID) | ORAL | Status: DC
  Administered 2022-09-30 (×2): 3.125 mg via ORAL
  Filled 2022-09-29 (×3): qty 1

## 2022-09-29 MED ORDER — DIGOXIN 125 MCG PO TABS
0.1250 mg | ORAL_TABLET | Freq: Every day | ORAL | Status: DC
  Administered 2022-10-02 – 2022-10-13 (×12): 0.125 mg via ORAL
  Filled 2022-09-29 (×14): qty 1

## 2022-09-29 MED ORDER — POTASSIUM CHLORIDE 2 MEQ/ML IV SOLN
INTRAVENOUS | Status: DC
  Filled 2022-09-29: qty 1000

## 2022-09-29 MED ORDER — POTASSIUM CHLORIDE 10 MEQ/100ML IV SOLN
10.0000 meq | INTRAVENOUS | Status: AC
  Administered 2022-09-29 (×2): 10 meq via INTRAVENOUS
  Filled 2022-09-29 (×2): qty 100

## 2022-09-29 MED ORDER — LACTATED RINGERS IV BOLUS
250.0000 mL | Freq: Once | INTRAVENOUS | Status: AC
  Administered 2022-09-29: 250 mL via INTRAVENOUS

## 2022-09-29 NOTE — Telephone Encounter (Signed)
Dr. Marin Olp notified of total bili-14.4.  No new orders received at this time.

## 2022-09-29 NOTE — ED Triage Notes (Signed)
Patient went to oncologist to get his ERCP results. They took his blood work and his bilirubin was 14.4. They think his biliary stent has a problem with it. They said there is a blockage near the pancreas. Has cancer that is showing up in the fluid near the pancreas.

## 2022-09-29 NOTE — ED Provider Notes (Signed)
[place] DEPT Provider Note   CSN: [id number] Arrival date & time: 10/26/2022  1304     History  Chief Complaint  Patient presents with   Abnormal Labs    [name] [name] is a 67 y.o. male.  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  Patient has not been doing well at home.  He continues to have 7-8 episodes of diarrhea per day that are nonbloody.  He has stopped eating because he is scared whenever he eats he is going to have another bowel movement.  He is not having any specific abdominal pain at this time but has become weak.  He is not having any chest pain or shortness of breath.  He has been compliant with his medications.  He has not had any fever.  He does however report he is lost another 10 pounds since leaving the hospital because he is not eating.  The history is provided by the patient, a relative and medical records.       Home Medications Prior to Admission medications   Medication Sig Start Date End Date Taking? Authorizing Provider  aspirin 81 MG EC tablet TAKE 1 TABLET(81 MG) BY MOUTH DAILY. SWALLOW WHOLE Patient taking differently: Take 81 mg by mouth daily. TAKE 1 TABLET(81 MG) BY MOUTH DAILY. SWALLOW WHOLE 11/04/21   Larey Dresser, MD  carvedilol (COREG) 3.125 MG tablet Take 1 tablet (3.125 mg total) by mouth 2 (two) times daily with a meal. 09/19/22   Thurnell Lose, MD  dapagliflozin propanediol (FARXIGA) 10 MG TABS tablet Take 1 tablet (10 mg total) by mouth daily before breakfast. 11/04/21   Larey Dresser, MD  digoxin (LANOXIN) 0.125 MG tablet Take 1 tablet (0.125 mg total) by mouth daily. 11/04/21   Larey Dresser, MD  ezetimibe (ZETIA) 10 MG tablet  TAKE 1 TABLET(10 MG) BY MOUTH DAILY 11/04/21   Larey Dresser, MD  isosorbide mononitrate (IMDUR) 30 MG 24 hr tablet Take 0.5 tablets (15 mg total) by mouth daily. 09/20/22   Thurnell Lose, MD  pantoprazole (PROTONIX) 40 MG tablet Take 1 tablet (40 mg total) by mouth daily. 09/20/22   Thurnell Lose, MD  rosuvastatin (CRESTOR) 40 MG tablet TAKE 1 TABLET(40 MG) BY MOUTH DAILY 11/04/21   Larey Dresser, MD  triamcinolone ointment (KENALOG) 0.5 % Apply 1 application topically 2 (two) times daily. 07/11/18   Clent Demark, PA-C      Allergies    Patient has no known allergies.    Review of Systems   Review of Systems  Physical Exam Updated Vital Signs BP 124/82 (BP Location: Left Arm)   Pulse 78   Temp 98.4 F (36.9 C) (Oral)   Resp 14   SpO2 100%  Physical Exam Vitals and nursing note reviewed.  Constitutional:      General: He is not in acute distress.    Appearance: He is well-developed and underweight. He is ill-appearing.  HENT:     Head: Normocephalic and atraumatic.     Mouth/Throat:     Mouth: Mucous membranes are dry.  Eyes:     General: Scleral icterus present.     Conjunctiva/sclera: Conjunctivae normal.  Pupils: Pupils are equal, round, and reactive to light.  Cardiovascular:     Rate and Rhythm: Normal rate and regular rhythm.     Heart sounds: No murmur heard. Pulmonary:     Effort: Pulmonary effort is normal. No respiratory distress.     Breath sounds: Normal breath sounds. No wheezing or rales.  Abdominal:     General: There is no distension.     Palpations: Abdomen is soft.     Tenderness: There is no abdominal tenderness. There is no guarding or rebound.  Musculoskeletal:        General: No tenderness. Normal range of motion.     Cervical back: Normal range of motion and neck supple.     Right lower leg: No edema.     Left lower leg: No edema.  Skin:    General: Skin is warm and dry.     Findings: No erythema or rash.  Neurological:      Mental Status: He is alert and oriented to person, place, and time. Mental status is at baseline.  Psychiatric:        Behavior: Behavior normal.     ED Results / Procedures / Treatments   Labs (all labs ordered are listed, but only abnormal results are displayed) Labs Reviewed  LIPASE, BLOOD  MAGNESIUM    EKG None  Radiology No results found.  Procedures Procedures    Medications Ordered in ED Medications  lactated ringers bolus 250 mL (has no administration in time range)  potassium chloride 10 mEq in 100 mL IVPB (has no administration in time range)    ED Course/ Medical Decision Making/ A&P                           Medical Decision Making Amount and/or Complexity of Data Reviewed Independent Historian: caregiver External Data Reviewed: notes. Labs: ordered. Decision-making details documented in ED Course.  Risk Prescription drug management. Decision regarding hospitalization.   Pt with multiple medical problems and comorbidities and presenting today with a complaint that caries a high risk for morbidity and mortality.  Patient is returning here today after seeing Dr. Marin Olp today in the office for ongoing care and admission.  Dr. Marin Olp felt that patient's bilirubin was still too high at 14 he is not eating continues to lose weight and is still having 7-8 episodes of diarrhea per day.  He feels that his stent is not working and he needs admission for multi service coordination and therapy.  He is not having any pain at this time.  Based on labs done prior to arrival potassium is 2.9, sodium is normal, creatinine is stable at 1 and LFTs are unchanged except for increasing alk phos at 717 and total bilirubin of 14.4 from 14 on last check.  Anion gap is normal, CBC with improving white count now at 14 from 15 7 days ago, improving hemoglobin of 11 from 10 and normal platelet count.  Patient's lipase and LDH is normal.  Will check a magnesium.  Patient has an EF of 25%  on last echo is not having any evidence of fluid overload at this time and earlier was noted to be hypotensive which is improved now.  Feel that patient is dehydrated as he is not eating at all and his only drank a total of 50 mL all day of a little bit of ginger ale.  Will give a gentle fluid bolus, potassium replacement and call  the gastroenterologist, oncologist and hospitalist for admission.         Final Clinical Impression(s) / ED Diagnoses Final diagnoses:  Failure to thrive in adult  Hyperbilirubinemia  Hypokalemia    Rx / DC Orders ED Discharge Orders     None         Blanchie Dessert, MD 09/30/2022 1821

## 2022-09-29 NOTE — Hospital Course (Signed)
[name] [name] is a 67 y.o. 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

## 2022-09-29 NOTE — Progress Notes (Signed)
Initial RN Navigator Patient Visit  Name: [name] [name] Date of Referral : 09/23/22 Diagnosis: Adenocarcinoma  Met with patient prior to their visit with MD. [name] patient "Your Patient Navigator" handout which explains my role, areas in which I am able to help, and all the contact information for myself and the office. Also gave patient MD and Navigator business card. Reviewed with patient the general overview of expected course after initial diagnosis and time frame for all steps to be completed.  New patient packet given to patient which includes: orientation to office and staff; campus directory; education on My Chart and Advance Directives; and patient centered education on cancer.  Patient completed visit with Dr. Marin Olp.   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   Patient understands all follow up procedures and expectations. They have my number to reach out for any further clarification or additional needs.   Oncology Nurse Navigator Documentation     09/30/2022   11:00 AM  Oncology Nurse Navigator Flowsheets  Abnormal Finding Date 09/15/2022  Confirmed Diagnosis Date 09/17/2022  Diagnosis Status Additional Work Up  Navigator Follow Up Date: 09/30/2022  Navigator Follow Up Reason: Appointment Review  Navigator Location [facility]  Referral Date to RadOnc/MedOnc 09/23/2022  Navigator Encounter Type Initial MedOnc  Patient Visit Type MedOnc  Treatment Phase Pre-Tx/Tx Discussion  Barriers/Navigation Needs Coordination of Care;Education  Education Newly Diagnosed Cancer Education;Pain/ Symptom Management  Interventions Education;Psycho-Social Support  Acuity Level 2-Minimal Needs (1-2 Barriers Identified)  Education Method Verbal;Written  Support Groups/Services Friends and Family  Time Spent with Patient 30

## 2022-09-29 NOTE — H&P (Signed)
History and Physical    [name] [name] CXK:[id number] DOB: 1955/10/11 DOA: 09/27/2022  PCP: Terrilyn Saver, NP  Patient coming from: Home  I have personally briefly reviewed patient's old medical records in [place]  Chief Complaint: Ampullary carcinoma  HPI: [name] [name] is a 67 y.o. 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  Patient recently admitted 09/15/2022-09/19/2022 for severe hyperbilirubinemia of 42 with CBD stricture.  He underwent ERCP on 12/22 which showed a severe malignant appearing stricture requiring stent placement.  Cytology showed malignant cells consistent with adenocarcinoma.  Patient had follow-up with oncology, Dr. Marin Olp, earlier today (10/20/2022).  He was noted to have persistent hyperbilirubinemia with weight loss and frail appearance.  He was sent to the ED for further evaluation and management.  Patient states that he has frequent loose stools anytime he eats.  He has not had any nausea, vomiting, abdominal pain, chest pain, dyspnea.  Denies fevers or chills.  ED Course  Labs/Imaging on admission: I have personally reviewed following labs and imaging studies.  Initial vitals showed BP 105/76, pulse 52, RR 16, temp 98.7 F, SpO2 100% on room air.  Labs obtained oncology office earlier today showed WBC 14.3, hemoglobin 11.5, platelets 265,000, sodium 139, potassium 2.9, BUN 13, creatinine 1.07, serum glucose 124, total bilirubin 14.4, alk phos 717, AST 57, ALT 39.  Patient was given 250 cc LR and IV K 10 mEq x 2.  EDP notified [facility] via secure chat.  The hospitalist service was consulted to admit for further evaluation and management.  Review of Systems: All systems reviewed and are negative  except as documented in history of present illness above.   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[place]) 02/63/7858   Chronic systolic CHF (congestive heart failure) ([place]) 01/31/2015   Coronary artery disease    Hypertension    Nonischemic cardiomyopathy ([place]) 06/30/2015    Past Surgical History:  Procedure Laterality Date   BILIARY BRUSHING  09/17/2022   Procedure: BILIARY BRUSHING;  Surgeon: Jackquline Denmark, MD;  Location: [facility] ENDOSCOPY;  Service: Gastroenterology;;   BILIARY STENT PLACEMENT  09/17/2022   Procedure: BILIARY STENT PLACEMENT;  Surgeon: Jackquline Denmark, MD;  Location: [place];  Service: Gastroenterology;;   CARDIAC CATHETERIZATION N/A 02/04/2015   Procedure: Right/Left Heart Cath and Coronary Angiography;  Surgeon: Larey Dresser, MD;  Location: [place] CV LAB;  Service: Cardiovascular;  Laterality: N/A;   CARDIAC DEFIBRILLATOR PLACEMENT  07/09/2015   CHALAZION EXCISION Left 01/16/2014   Procedure: MINOR EXCISION OF CHALAZION;  Surgeon: Myrtha Mantis., MD;  Location: [place];  Service: Ophthalmology;  Laterality: Left;   EP IMPLANTABLE DEVICE N/A 07/09/2015   Procedure: ICD Implant;  Surgeon: Deboraha Sprang, MD;  Location: [place] CV LAB;  Service: Cardiovascular;  Laterality: N/A;   ERCP N/A 09/17/2022   Procedure: ENDOSCOPIC RETROGRADE CHOLANGIOPANCREATOGRAPHY (ERCP);  Surgeon: Jackquline Denmark, MD;  Location[facility] ENDOSCOPY;  Service: Gastroenterology;  Laterality: N/A;   EYE SURGERY Left ~ 2013   "eye infection"   SPHINCTEROTOMY  09/17/2022   Procedure: SPHINCTEROTOMY;  Surgeon: Jackquline Denmark, MD;  Location: [facility] ENDOSCOPY;  Service: Gastroenterology;;    Social History:  reports that he has been smoking  cigarettes. He has a 40.00 pack-year smoking history. He has never used smokeless tobacco. He reports that he does not currently use alcohol. He reports that  he does not currently use drugs after having used the following drugs: "Crack" cocaine and Marijuana.  No Known Allergies  Family History  Problem Relation Age of Onset   Stroke Mother    Heart Problems Other      Prior to Admission medications   Medication Sig Start Date End Date Taking? Authorizing Provider  aspirin 81 MG EC tablet TAKE 1 TABLET(81 MG) BY MOUTH DAILY. SWALLOW WHOLE Patient taking differently: Take 81 mg by mouth daily. TAKE 1 TABLET(81 MG) BY MOUTH DAILY. SWALLOW WHOLE 11/04/21   Larey Dresser, MD  carvedilol (COREG) 3.125 MG tablet Take 1 tablet (3.125 mg total) by mouth 2 (two) times daily with a meal. 09/19/22   Thurnell Lose, MD  dapagliflozin propanediol (FARXIGA) 10 MG TABS tablet Take 1 tablet (10 mg total) by mouth daily before breakfast. 11/04/21   Larey Dresser, MD  digoxin (LANOXIN) 0.125 MG tablet Take 1 tablet (0.125 mg total) by mouth daily. 11/04/21   Larey Dresser, MD  ezetimibe (ZETIA) 10 MG tablet TAKE 1 TABLET(10 MG) BY MOUTH DAILY 11/04/21   Larey Dresser, MD  isosorbide mononitrate (IMDUR) 30 MG 24 hr tablet Take 0.5 tablets (15 mg total) by mouth daily. 09/20/22   Thurnell Lose, MD  pantoprazole (PROTONIX) 40 MG tablet Take 1 tablet (40 mg total) by mouth daily. 09/20/22   Thurnell Lose, MD  rosuvastatin (CRESTOR) 40 MG tablet TAKE 1 TABLET(40 MG) BY MOUTH DAILY 11/04/21   Larey Dresser, MD  triamcinolone ointment (KENALOG) 0.5 % Apply 1 application topically 2 (two) times daily. 07/11/18   Clent Demark, PA-C    Physical Exam: Vitals:   09/28/2022 1705 10/03/2022 1721 10/27/2022 1727 09/28/2022 1729  BP: (!) 130/93 124/82 124/82 124/82  Pulse:  77 76 78  Resp: _0 Temp:  (!) 97.5 F (36.4 C)  98.4 F (36.9 C)  TempSrc:  Oral  Oral  SpO2: 97%  99% 100%   Constitutional: Thin man resting in bed, NAD, calm, comfortable Eyes: EOMI, scleral icterus ENMT: Mucous membranes are dry. Posterior pharynx clear of any exudate  or lesions.Normal dentition.  Neck: normal, supple, no masses. Respiratory: clear to auscultation bilaterally, no wheezing, no crackles. Normal respiratory effort. No accessory muscle use.  Cardiovascular: Regular rate and rhythm, no murmurs / rubs / gallops. No extremity edema. 2+ pedal pulses.  ICD in place left upper chest wall. Abdomen: no tenderness, no masses palpated.  Musculoskeletal: Thin extremities.  No clubbing / cyanosis. No joint deformity upper and lower extremities. Good ROM, no contractures. Normal muscle tone.  Skin: no rashes, lesions, ulcers. No induration Neurologic: Sensation intact. Strength 5/5 in all 4.  Psychiatric: Alert and oriented x 3. Normal mood.   EKG: Not performed.  Assessment/Plan Principal Problem:   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HCC) Active Problems:   Hypokalemia   ICD (implantable cardioverter-defibrillator) in place   Common bile duct (CBD) stricture   Chronic systolic CHF (congestive heart failure) ([place])   Nonischemic cardiomyopathy (HCC)   [name] [name] is a 67 y.o. 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  Assessment  and Plan:  Ampullary carcinoma with malignant biliary stricture s/p stenting 09/17/2022 Persistent hyperbilirubinemia: Admitted from oncology office with persistent hyperbilirubinemia, concern for recurrent biliary duct stricture.  Patient with persistent leukocytosis 14.3 but no other signs or symptoms of active infection.  He has hypovolemic due to poor p.o. intake and GI losses. -[facility] notified via secure chat by EDP for routine consult -N.p.o. after midnight -Gentle IV fluid hydration overnight -RUQ ultrasound  Chronic systolic CHF s/p ICD Nonischemic cardiomyopathy: Hypovolemic  on admission.  Last EF 25% in May 2023. -Gentle IV fluid hydration as above -Monitor strict I/O's and daily weights -Continue Coreg 3.125 mg twice daily -Continue digoxin 0.125 mg daily -Hold Farxiga and Imdur for now  Hypokalemia: Oral and IV supplement given.  DVT prophylaxis: SCDs Start: 10/10/2022 1928 Code Status: Full code, confirmed on admission Family Communication: Daughter at bedside Disposition Plan: From home, dispo pending clinical progress Consults called: Secure chat to [facility], oncology added to treatment team Severity of Illness: The appropriate patient status for this patient is INPATIENT. Inpatient status is judged to be reasonable and necessary in order to provide the required intensity of service to ensure the patient's safety. The patient's presenting symptoms, physical exam findings, and initial radiographic and laboratory data in the context of their chronic comorbidities is felt to place them at high risk for further clinical deterioration. Furthermore, it is not anticipated that the patient will be medically stable for discharge from the hospital within 2 midnights of admission.   * I certify that at the point of admission it is my clinical judgment that the patient will require inpatient hospital care spanning beyond 2 midnights from the point of admission due to high intensity of service, high risk for further deterioration and high frequency of surveillance required.Zada Finders MD [facility]  If 7PM-7AM, please contact night-coverage [email or web address]  09/30/2022, 7:33 PM

## 2022-09-29 NOTE — Progress Notes (Signed)
[name] [name]  Clinical Social [name] was referred by Art therapist for assessment of psychosocial needs.  Clinical Social Worker met with patient to offer support and assess for needs.    CSW met briefly with patient and his daughter, [name] [name], prior to his visit with Dr. Marin Olp.  Provided contact information and education regarding the social [name] role.  They said they understood.  Patient lives alone.  His other daughter lives close by and visits him often.  He denied having difficulty obtaining food and transportation.  He would like to speak with the dietician regarding Ens[employer].  Made referral to [employer].  Patient and his daughter expressed no other issues.    Margaree Mackintosh, LCSW  Clinical Social Worker [facility]

## 2022-09-29 NOTE — ED Provider Triage Note (Signed)
Emergency Medicine Provider Triage Evaluation Note  [name] [name] , a 67 y.o. male  was evaluated in triage.  Patient sent from Dr. Antonieta Pert office for admission.  Patient appears to have some type of pancreatic cancer and recently had an ERCP with stent placement.  There is still appears to be some obstruction causing his bilirubin to be elevated.  It is elevated to 14.4 today, which is higher than previous, even with the stent placement.  He denies any symptoms  Review of Systems  Positive:  Negative:   Physical Exam  BP 105/76   Pulse (!) 52   Temp 98.7 F (37.1 C) (Oral)   Resp 16   SpO2 100%  Gen:   Awake, no distress   Resp:  Normal effort  MSK:   Moves extremities without difficulty  Other:  Underweight and chronically ill-appearing  Medical Decision Making  Medically screening exam initiated at 2:05 PM.  Appropriate orders placed.  [name] [name] was informed that the remainder of the evaluation will be completed by another provider, this initial triage assessment does not replace that evaluation, and the importance of remaining in the ED until their evaluation is complete.     Rhae Hammock, PA-C 10/07/2022 1406

## 2022-09-29 NOTE — Progress Notes (Signed)
Referral MD  Reason for Referral: Ampullary carcinoma with biliary obstruction  No chief complaint on file. : I was told to come here.  HPI: [name] is a very nice 67 year old African-American male.  I must say that he is quite thin.  According to his daughter, who comes with him, was that he was never all that big.  He did drink quite a bit.  He started about a month ago.  He does smoke.  He smokes about half pack per day.  He was hospitalized at [facility] back in December.  He presented with marked hyperbilirubinemia that was painless.  He was having a lot of diarrhea with this.  He had a CT scan that was done which showed intra hepatic extrahepatic biliary obstruction.  There is no obvious mass.  He had no adenopathy.  No fluid.  He was seen by Gastroenterology.  They went ahead and did an ERCP on him.  This was on 09/17/2022.  A severe stricture was noted in the lower third of the common bile duct.  It appeared to be malignant.  Brushings were done.  The pathology report ([id number]) showed malignant cells consistent with adenocarcinoma.  He did have a stent placed.  I think the sphincter was incised.  His tumor markers did show an elevated CEA of 67.  His CA 19-9 was normal at 2.  He subsequently was discharged on 09/19/2022.  He is still having diarrhea.  He is afraid to eat because of diarrhea.  He has not taken anything for the diarrhea.  He probably has about 7-8 episodes of diarrhea a day.  Other problems that he had is that he has congestive heart failure.  Back in May, he had a echocardiogram showed an ejection fraction of 25%.  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  He is followed by cardiology.  Ever since been home, he is really not eaten well.  He is lost about 10 more pounds.  His skin itches a little bit.  He has had no problems urinating.  His urine is still quite dark.  He has had no cough or shortness of breath.  I do not think there is been any  problems with COVID.  Currently, I would have to say that his performance status is probably ECOG 2.    Past Medical History:  Diagnosis Date   AICD (automatic cardioverter/defibrillator) present    Chronic systolic CHF (congestive heart failure) ([place]) 01/31/2015   Coronary artery disease    Hypertension    Nonischemic cardiomyopathy ([place]) 06/30/2015  :   Past Surgical History:  Procedure Laterality Date   BILIARY BRUSHING  09/17/2022   Procedure: BILIARY BRUSHING;  Surgeon: Jackquline Denmark, MD;  Location: [facility] ENDOSCOPY;  Service: Gastroenterology;;   BILIARY STENT PLACEMENT  09/17/2022   Procedure: BILIARY STENT PLACEMENT;  Surgeon: Jackquline Denmark, MD;  Location: [employer];  Service: Gastroenterology;;   CARDIAC CATHETERIZATION N/A 02/04/2015   Procedure: Right/Left Heart Cath and Coronary Angiography;  Surgeon: Larey Dresser, MD;  Location: [place] [facility];  Service: Cardiovascular;  Laterality: N/A;   CARDIAC DEFIBRILLATOR PLACEMENT  07/09/2015   CHALAZION EXCISION Left 01/16/2014   Procedure: MINOR EXCISION OF CHALAZION;  Surgeon: Myrtha Mantis., MD;  Location: [place];  Service: Ophthalmology;  Laterality: Left;   EP IMPLANTABLE DEVICE N/A 07/09/2015   Procedure: ICD Implant;  Surgeon: Deboraha Sprang, MD;  Location: [place] [facility];  Service: Cardiovascular;  Laterality: N/A;  ERCP N/A 09/17/2022   Procedure: ENDOSCOPIC RETROGRADE CHOLANGIOPANCREATOGRAPHY (ERCP);  Surgeon: Jackquline Denmark, MD;  Location[facility];  Service: Gastroenterology;  Laterality: N/A;   EYE SURGERY Left ~ 2013   "eye infection"   SPHINCTEROTOMY  09/17/2022   Procedure: SPHINCTEROTOMY;  Surgeon: Jackquline Denmark, MD;  Location: [facility] ENDOSCOPY;  Service: Gastroenterology;;  :   Current Outpatient Medications:    aspirin 81 MG EC tablet, TAKE 1 TABLET(81 MG) BY MOUTH DAILY. SWALLOW WHOLE (Patient taking differently: Take 81 mg by mouth daily. TAKE 1 TABLET(81 MG) BY MOUTH  DAILY. SWALLOW WHOLE), Disp: 30 tablet, Rfl: 11   carvedilol (COREG) 3.125 MG tablet, Take 1 tablet (3.125 mg total) by mouth 2 (two) times daily with a meal., Disp: 60 tablet, Rfl: 0   dapagliflozin propanediol (FARXIGA) 10 MG TABS tablet, Take 1 tablet (10 mg total) by mouth daily before breakfast., Disp: 30 tablet, Rfl: 11   digoxin (LANOXIN) 0.125 MG tablet, Take 1 tablet (0.125 mg total) by mouth daily., Disp: 90 tablet, Rfl: 3   ezetimibe (ZETIA) 10 MG tablet, TAKE 1 TABLET(10 MG) BY MOUTH DAILY, Disp: 90 tablet, Rfl: 3   isosorbide mononitrate (IMDUR) 30 MG 24 hr tablet, Take 0.5 tablets (15 mg total) by mouth daily., Disp: 30 tablet, Rfl: 0   pantoprazole (PROTONIX) 40 MG tablet, Take 1 tablet (40 mg total) by mouth daily., Disp: 30 tablet, Rfl: 0   rosuvastatin (CRESTOR) 40 MG tablet, TAKE 1 TABLET(40 MG) BY MOUTH DAILY, Disp: 90 tablet, Rfl: 3   triamcinolone ointment (KENALOG) 0.5 %, Apply 1 application topically 2 (two) times daily., Disp: 15 g, Rfl: 0:  :  No Known Allergies:   Family History  Problem Relation Age of Onset   Stroke Mother    Heart Problems Other   :   Social History   Socioeconomic History   Marital status: Single    Spouse name: Not on file   Number of children: Not on file   Years of education: Not on file   Highest education level: Not on file  Occupational History   Not on file  Tobacco Use   Smoking status: Every Day    Packs/day: 1.00    Years: 40.00    Total pack years: 40.00    Types: Cigarettes   Smokeless tobacco: Never  Vaping Use   Vaping Use: Every day  Substance and Sexual Activity   Alcohol use: Not Currently    Comment: 07/09/2015 "I haven't had a drink since last of 12/2014"   Drug use: Not Currently    Types: "Crack" cocaine, Marijuana    Comment: 07/09/2015 "I did drugs a long time;ago  quit in the mid 1980's"   Sexual activity: Not Currently  Other Topics Concern   Not on file  Social History Narrative   Not on file    Social Determinants of Health   Financial Resource Strain: Not on file  Food Insecurity: Food Insecurity Present (09/18/2022)   Hunger Vital Sign    Worried About Running Out of Food in the Last Year: Never true    Ran Out of Food in the Last Year: Sometimes true  Transportation Needs: No Transportation Needs (10/18/2022)   PRAPARE - Hydrologist (Medical): No    Lack of Transportation (Non-Medical): No  Physical Activity: Not on file  Stress: Not on file  Social Connections: Not on file  Intimate Partner Violence: Not At Risk (10/05/2022)   Humiliation, Afraid, Rape,  and Kick questionnaire    Fear of Current or Ex-Partner: No    Emotionally Abused: No    Physically Abused: No    Sexually Abused: No  :  Review of Systems  Constitutional:  Positive for malaise/fatigue and weight loss.  HENT: Negative.    Eyes: Negative.   Respiratory: Negative.    Cardiovascular:  Positive for palpitations.  Gastrointestinal: Negative.   Genitourinary: Negative.   Musculoskeletal: Negative.   Skin:  Positive for itching.  Neurological: Negative.   Endo/Heme/Allergies: Negative.   Psychiatric/Behavioral: Negative.      Exam: His vital signs are temperature of 98.  Pulse 94.  Blood pressure 106/75.  Weight is 128 pounds.  '@IPVITALS'$ @ Physical Exam Vitals reviewed.  Constitutional:      Comments: This is a thin African-American male.  He is somewhat cachectic.  He is in no obvious distress.  HENT:     Head: Normocephalic and atraumatic.  Eyes:     Pupils: Pupils are equal, round, and reactive to light.     Comments: Ocular exam does show scleral icterus.  He does have extraocular muscle movement that is normal.  His pupils react appropriately.  Cardiovascular:     Rate and Rhythm: Normal rate and regular rhythm.     Heart sounds: Normal heart sounds.     Comments: Cardiac exam is regular rate and rhythm.  He has an occasional extra beat.  He has 1/6 systolic  ejection murmur. Pulmonary:     Effort: Pulmonary effort is normal.     Breath sounds: Normal breath sounds.  Abdominal:     General: Bowel sounds are normal.     Palpations: Abdomen is soft.     Comments: Abdomen is soft.  He has decent bowel sounds.  There is no fluid wave.  There is no guarding or rebound tenderness.  I cannot palpate any abdominal mass.  There is no palpable liver or spleen tip.  Musculoskeletal:        General: No tenderness or deformity. Normal range of motion.     Cervical back: Normal range of motion.     Comments: Extremity shows symmetric muscle atrophy bilaterally.  He has decent range of motion of his joints.  He has decent strength bilaterally.  Lymphadenopathy:     Cervical: No cervical adenopathy.  Skin:    General: Skin is warm and dry.     Findings: No erythema or rash.  Neurological:     Mental Status: He is alert and oriented to person, place, and time.  Psychiatric:        Behavior: Behavior normal.        Thought Content: Thought content normal.        Judgment: Judgment normal.     Recent Labs    10/23/2022 1107  WBC 14.3*  HGB 11.5*  HCT 33.2*  PLT 265    Recent Labs    09/30/2022 1107  NA 139  K 2.9*  CL 103  CO2 25  GLUCOSE 124*  BUN 13  CREATININE 1.07  CALCIUM 8.8*    Blood smear review: None  Pathology: See above    Assessment and Plan: [name] is a very nice 67 year old African-American male.  He has marked hyperbilirubinemia.  His bilirubin is still quite elevated.  He is having a lot of diarrhea.  I think that he is going had to be admitted again.  He just looks quite frail.  Again the bilirubin is 14.4.  He  has marked hypokalemia.  Ideally, this malignancy will be dealt with surgery.  I doubt that surgery would even consider operating on him because of his congestive heart failure with a ejection fraction of 25%.  He clearly has localized disease.  The scan does not show anything that looks like it is  metastatic.  He had a normal CA 19-9.  The CEA was on the higher side.  I think he is going need to have another ERCP done.  Maybe, needs to have a metal stent placed to try to help open the biliary duct.  I think that he is probably going need radiation therapy for the malignancy.  Whether or not he can take any chemotherapy is a whole another set of problems.  He does not have a great performance status.  As such, I am unsure how aggressive we can be was using chemotherapy with radiation.  He did findings have another echocardiogram done while in the hospital.  He does see Cardiology.  I am glad that he actually came in to see [place].  He clearly needs to be back in the hospital.  Maybe, the stricture can be opened up.

## 2022-09-30 ENCOUNTER — Encounter: Payer: Self-pay | Admitting: *Deleted

## 2022-09-30 ENCOUNTER — Ambulatory Visit: Payer: Medicare Other | Admitting: Dietician

## 2022-09-30 DIAGNOSIS — D135 Benign neoplasm of extrahepatic bile ducts: Secondary | ICD-10-CM

## 2022-09-30 DIAGNOSIS — C249 Malignant neoplasm of biliary tract, unspecified: Secondary | ICD-10-CM

## 2022-09-30 DIAGNOSIS — Z9889 Other specified postprocedural states: Secondary | ICD-10-CM

## 2022-09-30 DIAGNOSIS — C241 Malignant neoplasm of ampulla of Vater: Secondary | ICD-10-CM | POA: Diagnosis not present

## 2022-09-30 DIAGNOSIS — K831 Obstruction of bile duct: Secondary | ICD-10-CM | POA: Diagnosis not present

## 2022-09-30 DIAGNOSIS — R7989 Other specified abnormal findings of blood chemistry: Secondary | ICD-10-CM | POA: Diagnosis not present

## 2022-09-30 LAB — COMPREHENSIVE METABOLIC PANEL
ALT: 33 U/L (ref 0–44)
AST: 54 U/L — ABNORMAL HIGH (ref 15–41)
Albumin: 1.9 g/dL — ABNORMAL LOW (ref 3.5–5.0)
Alkaline Phosphatase: 625 U/L — ABNORMAL HIGH (ref 38–126)
Anion gap: 7 (ref 5–15)
BUN: 11 mg/dL (ref 8–23)
CO2: 21 mmol/L — ABNORMAL LOW (ref 22–32)
Calcium: 8 mg/dL — ABNORMAL LOW (ref 8.9–10.3)
Chloride: 107 mmol/L (ref 98–111)
Creatinine, Ser: 0.84 mg/dL (ref 0.61–1.24)
GFR, Estimated: >60 mL/min (ref >60)
Glucose, Bld: 86 mg/dL (ref 70–99)
Potassium: 4.1 mmol/L (ref 3.5–5.1)
Sodium: 135 mmol/L (ref 135–145)
Total Bilirubin: 9.8 mg/dL — ABNORMAL HIGH (ref 0.3–1.2)
Total Protein: 5.2 g/dL — ABNORMAL LOW (ref 6.5–8.1)

## 2022-09-30 LAB — CBC
HCT: 27.8 % — ABNORMAL LOW (ref 39.0–52.0)
Hemoglobin: 9.8 g/dL — ABNORMAL LOW (ref 13.0–17.0)
MCH: 33.3 pg (ref 26.0–34.0)
MCHC: 35.3 g/dL (ref 30.0–36.0)
MCV: 94.6 fL (ref 80.0–100.0)
Platelets: 205 10*3/uL (ref 150–400)
RBC: 2.94 MIL/uL — ABNORMAL LOW (ref 4.22–5.81)
RDW: 14.8 % (ref 11.5–15.5)
WBC: 13.1 10*3/uL — ABNORMAL HIGH (ref 4.0–10.5)
nRBC: 0 % (ref 0.0–0.2)

## 2022-09-30 LAB — PROTIME-INR
INR: 1.3 — ABNORMAL HIGH (ref 0.8–1.2)
Prothrombin Time: 16 seconds — ABNORMAL HIGH (ref 11.4–15.2)

## 2022-09-30 LAB — CANCER ANTIGEN 19-9: CA 19-9: <2 U/mL (ref 0–35)

## 2022-09-30 NOTE — Consult Note (Signed)
Consultation Note   Referring Provider:  [facility] PCP: Terrilyn Saver, NP Primary Gastroenterologist: Saw Dr. Bryan Lemma during Dec 2023 admission.   Reason for consultation: biliary obstruction   Hospital Day: 2  Assessment    Patient profile:  [name] [name] is a 67 y.o. male with a past medical history significant for nonischemic cardiomyopathy status post ICD placement, chronic systolic heart failure with an EF of 25%, hypertension and recently diagnosed malignant biliary stricture ( ? Cholangiocarcinoma). See PMH for any additional medical problems.   # Recently diagnosed malignant biliary stricture ( adenocarcinoma), ? Cholangiocarcinoma. No evidence for metastatic disease on CT scan. He had a plastic stent placed 12/22 and it appears to be occluded now. He established care with Oncology yesterday and plan is for probable radiation, +/- chemotherapy.   # Persistent diarrhea, mainly postprandial. Stool studies negative 09/17/22 so infectious etiology unlikely. EPI?  SIBO?  Of note, CT scan last month showed an abnormal area of thickening of CT scan.   # Severe hypoalbuminemia / malnutrition.   # Hypokalemia, resolved. K+ 4.1 today  # Nonischemic cardiomyopathy status post ICD placement, chronic systolic heart failure with an EF of 25%  # See PMH for additional medical problems  Plan   Needs ERCP with stent exchange tomorrow. The benefits and risks of ERCP with possible sphincterotomy not limited to cardiopulmonary complications of sedation, bleeding, infection, perforation,and pancreatitis were discussed with the patient who agrees to proceed.   Okay to eat today from GI standpoint. NPO after MN INR okay at 1.3 May need eventual flex sigmoidoscopy to evaluate sigmoid thickening on CT scan.    HPI   [name] was hospitalized late December with elevated liver chemistries, biliary duct dilation and diarrhea. Stool studies  were negative. Inpatient ERCP remarkable for severe lower CBD stricture and cytology + for adenocarcinoma. Plastic stent was placed. Had initial outpatient Oncology visit yesterday. Bilirubin was high again. Potassium was low. He was sent to ED.   [name] has continued to have diarrhea, almost every time he eats. No blood in stool. He says his appetite is okay. He has no abdominal pain. No nausea / vomiting. He isn't taking anything at home for the diarrhea  CEA elevated at 256 CA 19-9 normal   Significant studies this admission   Labs:  WBC 131. Hgb  9.8 Cr 0.8 Tbili 14.4 in ED >> 9.8 today ALk phos 625 AST 54 ALT 33 INR 1.3  Brief summary of imaging ( see full reports below) US - gallbladder sludge. Interval development of intra / extrahepatic biliary duct dilation. Debris occluding CBD and CBD stent   Previous GI Evaluation     09/17/22 ERCP -single severe biliary stricture was found in the lower third of the main bile duct. The stricture was malignant appearing. - The entire biliary tree was dilated. - Cells for cytology obtained in the lower third of the main duct. - A biliary sphincterotomy was performed. - One plastic stent was placed into the common bile duct.  Cytology >> malignant cells c/w adenocarcinoma  Recent Labs and Imaging US Abdomen Limited RUQ (LIVER/GB)  Result Date: 09/28/2022 CLINICAL DATA:  [id number] Biliary obstruction [id number] EXAM: ULTRASOUND ABDOMEN LIMITED RIGHT UPPER QUADRANT COMPARISON:  CT  abdomen pelvis 09/15/2022, ERCP 09/17/2022 FINDINGS: Gallbladder: Layering gallbladder sludge within the gallbladder lumen. Avascular masslike echogenic foci along the gallbladder wall measuring 2.4 x 3.2 x 2.3 cm. Possible gallbladder wall thickening. No sonographic Murphy sign noted by sonographer. Common bile duct: Diameter: 18 mm. Echogenic debris noted occluding the common bile duct. Echogenic debris also noted occluding the stent. Liver: Interval development of  intrahepatic biliary ductal dilatation. No focal lesion identified. Within normal limits in parenchymal echogenicity. Portal vein is patent on color Doppler imaging with normal direction of blood flow towards the liver. Other: Simple free fluid ascites. IMPRESSION: 1. Gallbladder sludge as well as avascular masslike echogenic foci along the gallbladder wall measuring 2.4 x 3.2 x 2.3 cm-Findings suggestive of inspissated gallbladder sludge. Possible gallbladder wall thickening. Negative sonographic Percell Miller sign reported. Correlate clinically for acute cholecystitis. 2. Interval development of intra and extrahepatic biliary ductal dilatation. Dilated common bile duct. Echogenic debris occluding both the common bile duct and the common bile duct stent. 3. Recommend surgical consultation. 4. Simple free fluid ascites. Electronically Signed   By: Iven Finn M.D.   On: 10/18/2022 20:29   DG Chest 2 View  Result Date: 09/23/2022 CLINICAL DATA:  Recent hospitalization. History of CHF. No new symptoms. EXAM: CHEST - 2 VIEW COMPARISON:  09/07/2019. FINDINGS: Cardiac silhouette normal in size and configuration. Normal mediastinal and hilar contours. Single lead left anterior chest wall AICD is stable in well positioned. Clear lungs.  No pleural effusion or pneumothorax. Skeletal structures are unremarkable. IMPRESSION: No active cardiopulmonary disease. Electronically Signed   By: Lajean Manes M.D.   On: 09/23/2022 12:53   DG ERCP  Result Date: 09/17/2022 CLINICAL DATA:  Biliary ductal dilation. EXAM: ERCP COMPARISON:  CT AP, 09/15/2022.  US Abdomen, 09/15/2022. FLUOROSCOPY: Exposure Index (as provided by the fluoroscopic device): 28.3 mGy Kerma FINDINGS: Multiple, limited oblique planar images of the RIGHT upper quadrant obtained C-arm. Images demonstrating flexible endoscopy, biliary duct cannulation, sphincterotomy, retrograde cholangiogram and plastic biliary stent placement. Intrahepatic biliary ductal  dilation is demonstrated. IMPRESSION: Fluoroscopic imaging for ERCP and plastic biliary stent placement. For complete description of intra procedural findings, please see performing service dictation. Electronically Signed   By: Michaelle Birks M.D.   On: 09/17/2022 17:28   DG C-Arm 1-60 Min-No Report  Result Date: 09/17/2022 Fluoroscopy was utilized by the requesting physician.  No radiographic interpretation.   CT ABDOMEN PELVIS WO CONTRAST  Result Date: 09/15/2022 CLINICAL DATA:  Left-sided abdominal pain, diarrhea EXAM: CT ABDOMEN AND PELVIS WITHOUT CONTRAST TECHNIQUE: Multidetector CT imaging of the abdomen and pelvis was performed following the standard protocol without IV contrast. RADIATION DOSE REDUCTION: This exam was performed according to the departmental dose-optimization program which includes automated exposure control, adjustment of the mA and/or kV according to patient size and/or use of iterative reconstruction technique. COMPARISON:  Abdomen sonogram done earlier today, CT abdomen done on 11/27/2004. FINDINGS: Lower chest: Pacer leads are noted in place. Visualized lower lung fields are clear. Hepatobiliary: There is abnormal dilation of intrahepatic and extrahepatic bile ducts. Distal common bile duct in the head of the pancreas measures 14 mm. Gallbladder is distended. There is no wall thickening in gallbladder. There is no fluid around the gallbladder. Pancreas: No focal abnormalities are seen in pancreas. Evaluation is limited in this noncontrast study. Spleen: Unremarkable. Adrenals/Urinary Tract: Adrenals are unremarkable. There is no hydronephrosis. Small calcifications are seen in left renal artery branches. There are no demonstrable renal or ureteral stones. Urinary bladder is  not distended. Stomach/Bowel: Stomach is not distended. Small bowel loops are not dilated. Appendix is not distinctly seen. There is no pericecal inflammation. There is abnormal wall thickening in sigmoid  colon measuring up to 2.4 cm in thickness. Vascular/Lymphatic: There are calcifications in abdominal aorta and its major branches. There is aneurysmal dilation of infrarenal aorta measuring 4.6 x 4.1 cm. Evaluation of lumen is limited in this noncontrast study. There is aneurysmal dilation of right common iliac artery measuring 2.4 cm. There is aneurysmal dilation in left common iliac artery measuring 2.3 cm. There is no demonstrable retroperitoneal hematoma. No significant lymphadenopathy is seen. Reproductive: Prostate is enlarged. Other: There is no ascites or pneumoperitoneum. Umbilical hernia containing fat is seen. Musculoskeletal: Degenerative changes are noted in lumbar spine with disc space narrowing, bony spurs, facet hypertrophy and encroachment of neural foramina at multiple levels, more so at L4-L5 and L5-S1 levels. IMPRESSION: There is no evidence of intestinal obstruction or pneumoperitoneum. There is no hydronephrosis. There is abnormal dilation of intrahepatic and extrahepatic bile ducts. Distal common bile duct measures 1.4 cm. Evaluation of pancreas in the region of ampulla is limited in this noncontrast study. Findings may be related to noncalcified calculus in the distal common bile duct or stricture in the distal common bile duct. Follow-up MRI and MRCP may be considered. Distention of gallbladder may be due to stricture in distal common bile duct and possibly fasting state of the patient. There is abnormal wall thickening in sigmoid colon. Possibility of malignant neoplastic process in sigmoid colon is not excluded. Endoscopic correlation should be considered. There is aneurysmal dilation of infrarenal abdominal aorta measuring 4.6 cm in diameter. There is aneurysmal dilation of both common iliac arteries measuring up to 2.4 cm. Recommend follow-up every 6 months and vascular consultation.Reference: J Am Coll Radiol 7588;32:549-826. Enlarged prostate.  Lumbar spondylosis. Other findings as  described in the body of the report. Electronically Signed   By: Elmer Picker M.D.   On: 09/15/2022 21:45   US Abdomen Limited RUQ (LIVER/GB)  Result Date: 09/15/2022 CLINICAL DATA:  Pain right upper quadrant EXAM: ULTRASOUND ABDOMEN LIMITED RIGHT UPPER QUADRANT COMPARISON:  None Available. FINDINGS: Gallbladder: There are no demonstrable gallbladder stones. Technologist did not observe any tenderness over the gallbladder. There are low-level echoes in the lumen suggesting possible sludge. There is no fluid around the gallbladder. Common bile duct: Diameter: Common bile duct measures 1.5 cm. There is dilation of intrahepatic bile ducts. Distal common bile duct is obscured by bowel gas. Liver: There is increased echogenicity in liver. No focal abnormalities are seen in the visualized portions of liver. Portal vein is patent on color Doppler imaging with normal direction of blood flow towards the liver. Other: None. IMPRESSION: Sludge is seen in the lumen of gallbladder. There are no demonstrable gallbladder stones or imaging signs of acute cholecystitis. There is dilation of intrahepatic and visualized proximal extrahepatic bile ducts. Proximal common bile duct measures 1.5 cm in diameter. This may suggest stricture or calculus in the distal common bile duct. Distal common bile duct is obscured by bowel gas and not evaluated. If clinically warranted, follow-up CT may be considered. Electronically Signed   By: Elmer Picker M.D.   On: 09/15/2022 18:28    Labs:  Recent Labs    09/27/2022 1107 09/30/22 0405  WBC 14.3* 13.1*  HGB 11.5* 9.8*  HCT 33.2* 27.8*  PLT 265 205   Recent Labs    09/30/2022 1107 09/30/22 0405  NA 139 135  K 2.9* 4.1  CL 103 107  CO2 25 21*  GLUCOSE 124* 86  BUN 13 11  CREATININE 1.07 0.84  CALCIUM 8.8* 8.0*   Recent Labs    09/30/22 0405  PROT 5.2*  ALBUMIN 1.9*  AST 54*  ALT 33  ALKPHOS 625*  BILITOT 9.8*   No results for input(s): "HEPBSAG",  "HCVAB", "HEPAIGM", "HEPBIGM" in the last 72 hours. 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   Chronic systolic CHF (congestive heart failure) ([place]) 01/31/2015   Coronary artery disease    Hypertension    Nonischemic cardiomyopathy ([place]) 06/30/2015    Past Surgical History:  Procedure Laterality Date   BILIARY BRUSHING  09/17/2022   Procedure: BILIARY BRUSHING;  Surgeon: Jackquline Denmark, MD;  Location: [facility] ENDOSCOPY;  Service: Gastroenterology;;   BILIARY STENT PLACEMENT  09/17/2022   Procedure: BILIARY STENT PLACEMENT;  Surgeon: Jackquline Denmark, MD;  Location: [facility];  Service: Gastroenterology;;   CARDIAC CATHETERIZATION N/A 02/04/2015   Procedure: Right/Left Heart Cath and Coronary Angiography;  Surgeon: Larey Dresser, MD;  Location: [place] [facility];  Service: Cardiovascular;  Laterality: N/A;   CARDIAC DEFIBRILLATOR PLACEMENT  07/09/2015   CHALAZION EXCISION Left 01/16/2014   Procedure: MINOR EXCISION OF CHALAZION;  Surgeon: Myrtha Mantis., MD;  Location: [facility];  Service: Ophthalmology;  Laterality: Left;   EP IMPLANTABLE DEVICE N/A 07/09/2015   Procedure: ICD Implant;  Surgeon: Deboraha Sprang, MD;  Location: [place] [facility];  Service: Cardiovascular;  Laterality: N/A;   ERCP N/A 09/17/2022   Procedure: ENDOSCOPIC RETROGRADE CHOLANGIOPANCREATOGRAPHY (ERCP);  Surgeon: Jackquline Denmark, MD;  Location: [facility] ENDOSCOPY;  Service: Gastroenterology;  Laterality: N/A;   EYE SURGERY Left ~ 2013   "eye infection"   SPHINCTEROTOMY  09/17/2022   Procedure: SPHINCTEROTOMY;  Surgeon: Jackquline Denmark, MD;  Location: [facility] ENDOSCOPY;  Service: Gastroenterology;;    Family History  Problem Relation Age of Onset   Stroke Mother    Heart Problems Other     Prior to Admission medications   Medication Sig Start Date  End Date Taking? Authorizing Provider  aspirin 81 MG EC tablet TAKE 1 TABLET(81 MG) BY MOUTH DAILY. SWALLOW WHOLE Patient taking differently: Take 81 mg by mouth daily. TAKE 1 TABLET(81 MG) BY MOUTH DAILY. SWALLOW WHOLE 11/04/21  Yes Larey Dresser, MD  carvedilol (COREG) 3.125 MG tablet Take 1 tablet (3.125 mg total) by mouth 2 (two) times daily with a meal. 09/19/22  Yes Thurnell Lose, MD  ezetimibe (ZETIA) 10 MG tablet TAKE 1 TABLET(10 MG) BY MOUTH DAILY 11/04/21  Yes Larey Dresser, MD  isosorbide mononitrate (IMDUR) 30 MG 24 hr tablet Take 0.5 tablets (15 mg total) by mouth daily. 09/20/22  Yes Thurnell Lose, MD  pantoprazole (PROTONIX) 40 MG tablet Take 1 tablet (40 mg total) by mouth daily. Patient taking differently: Take 40 mg by mouth daily as needed (indigestion). 09/20/22  Yes Thurnell Lose, MD  rosuvastatin (CRESTOR) 40 MG tablet TAKE 1 TABLET(40 MG) BY MOUTH DAILY 11/04/21  Yes Larey Dresser, MD  dapagliflozin propanediol (FARXIGA) 10 MG TABS tablet Take 1 tablet (10 mg total) by mouth daily before breakfast. Patient not taking: Reported on 10/07/2022 11/04/21   Larey Dresser, MD  digoxin (LANOXIN) 0.125 MG tablet Take 1 tablet (0.125  mg total) by mouth daily. Patient not taking: Reported on 10/16/2022 11/04/21   Larey Dresser, MD  triamcinolone ointment (KENALOG) 0.5 % Apply 1 application topically 2 (two) times daily. Patient not taking: Reported on 10/19/2022 07/11/18   Clent Demark, PA-C    Current Facility-Administered Medications  Medication Dose Route Frequency Provider Last Rate Last Admin   carvedilol (COREG) tablet 3.125 mg  3.125 mg Oral BID WC Lenore Cordia, MD       digoxin (LANOXIN) tablet 0.125 mg  0.125 mg Oral Daily Lenore Cordia, MD       ondansetron (ZOFRAN) tablet 4 mg  4 mg Oral Q6H PRN Lenore Cordia, MD       Or   ondansetron (ZOFRAN) injection 4 mg  4 mg Intravenous Q6H PRN Lenore Cordia, MD       pantoprazole (PROTONIX) EC tablet 40  mg  40 mg Oral Daily Patel, Vishal R, MD       sodium chloride flush (NS) 0.9 % injection 3 mL  3 mL Intravenous Q12H Lenore Cordia, MD   3 mL at 10/17/2022 2132   Current Outpatient Medications  Medication Sig Dispense Refill   aspirin 81 MG EC tablet TAKE 1 TABLET(81 MG) BY MOUTH DAILY. SWALLOW WHOLE (Patient taking differently: Take 81 mg by mouth daily. TAKE 1 TABLET(81 MG) BY MOUTH DAILY. SWALLOW WHOLE) 30 tablet 11   carvedilol (COREG) 3.125 MG tablet Take 1 tablet (3.125 mg total) by mouth 2 (two) times daily with a meal. 60 tablet 0   ezetimibe (ZETIA) 10 MG tablet TAKE 1 TABLET(10 MG) BY MOUTH DAILY 90 tablet 3   isosorbide mononitrate (IMDUR) 30 MG 24 hr tablet Take 0.5 tablets (15 mg total) by mouth daily. 30 tablet 0   pantoprazole (PROTONIX) 40 MG tablet Take 1 tablet (40 mg total) by mouth daily. (Patient taking differently: Take 40 mg by mouth daily as needed (indigestion).) 30 tablet 0   rosuvastatin (CRESTOR) 40 MG tablet TAKE 1 TABLET(40 MG) BY MOUTH DAILY 90 tablet 3   dapagliflozin propanediol (FARXIGA) 10 MG TABS tablet Take 1 tablet (10 mg total) by mouth daily before breakfast. (Patient not taking: Reported on 10/24/2022) 30 tablet 11   digoxin (LANOXIN) 0.125 MG tablet Take 1 tablet (0.125 mg total) by mouth daily. (Patient not taking: Reported on 10/19/2022) 90 tablet 3   triamcinolone ointment (KENALOG) 0.5 % Apply 1 application topically 2 (two) times daily. (Patient not taking: Reported on 10/22/2022) 15 g 0    Allergies as of 10/07/2022   (No Known Allergies)    Social History   Socioeconomic History   Marital status: Single    Spouse name: Not on file   Number of children: Not on file   Years of education: Not on file   Highest education level: Not on file  Occupational History   Not on file  Tobacco Use   Smoking status: Every Day    Packs/day: 1.00    Years: 40.00    Total pack years: 40.00    Types: Cigarettes   Smokeless tobacco: Never  Vaping Use    Vaping Use: Every day  Substance and Sexual Activity   Alcohol use: Not Currently    Comment: 07/09/2015 "I haven't had a drink since last of 12/2014"   Drug use: Not Currently    Types: "Crack" cocaine, Marijuana    Comment: 07/09/2015 "I did drugs a long time;ago  quit in the mid 1980's"  Sexual activity: Not Currently  Other Topics Concern   Not on file  Social History Narrative   Not on file   Social Determinants of Health   Financial Resource Strain: Not on file  Food Insecurity: Food Insecurity Present (09/18/2022)   Hunger Vital Sign    Worried About Running Out of Food in the Last Year: Never true    Ran Out of Food in the Last Year: Sometimes true  Transportation Needs: No Transportation Needs (10/23/2022)   PRAPARE - Hydrologist (Medical): No    Lack of Transportation (Non-Medical): No  Physical Activity: Not on file  Stress: Not on file  Social Connections: Not on file  Intimate Partner Violence: Not At Risk (10/22/2022)   Humiliation, Afraid, Rape, and Kick questionnaire    Fear of Current or Ex-Partner: No    Emotionally Abused: No    Physically Abused: No    Sexually Abused: No    Review of Systems: All systems reviewed and negative except where noted in HPI.  Physical Exam: Vital signs in last 24 hours: Temp:  [97.5 F (36.4 C)-99.1 F (37.3 C)] 98.3 F (36.8 C) (01/04 0741) Pulse Rate:  [52-94] 70 (01/04 0630) Resp:  [14-20] 17 (01/04 0630) BP: (87-130)/(67-93) 116/69 (01/04 0630) SpO2:  [97 %-100 %] 97 % (01/04 0630) Weight:  [58.2 kg] 58.2 kg (01/03 1140) Last BM Date : 10/12/2022  General:  Alert thinmale in NAD Psych:  Pleasant, cooperative. Normal mood and affect Eyes: Pupils equal Ears:  Normal auditory acuity Nose: No deformity, discharge or lesions Neck:  Supple, no masses felt Lungs:  Clear to auscultation.  Heart:  Regular rate, regular rhythm.  Abdomen:  Soft, nondistended, nontender, active bowel sounds, no  masses felt Rectal :  Deferred Msk: Symmetrical without gross deformities.  Neurologic:  Alert, oriented, grossly normal neurologically Extremities : No edema Skin:  Intact without significant lesions.    Intake/Output from previous day: 01/03 0701 - 01/04 0700 In: 498.7 [I.V.:498.7] Out: -  Intake/Output this shift:  No intake/output data recorded.    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

## 2022-09-30 NOTE — Progress Notes (Signed)
Patient sent to ED for admission from his new patient appointment. His bilirubin was critically high suggesting blockage of his stent. He will be admitted to [facility] for symptom management and possibly continue workup/staging.   Referral to social work and dietary placed.   Oncology Nurse Navigator Documentation     09/30/2022    8:15 AM  Oncology Nurse Navigator Flowsheets  Navigator Follow Up Date: 10/05/2022  Navigator Follow Up Reason: Appointment Review  Navigator Location [facility]  Navigator Encounter Type Appt/Treatment Plan Review  Patient Visit Type MedOnc  Treatment Phase Pre-Tx/Tx Discussion  Barriers/Navigation Needs Coordination of Care;Education  Interventions Referrals  Acuity Level 2-Minimal Needs (1-2 Barriers Identified)  Referrals Nutrition/dietician;Social Work  Support Groups/Services Friends and Family  Time Spent with Patient 15

## 2022-09-30 NOTE — Progress Notes (Signed)
Patient is in hospital.  Called patient's daughter Bonnita Nasuti as requested in appointment notes.  Let her know that nutrition services with the [facility] are provided at no charge.  Also relayed that [facility] is covered remotely and that I would reschedule his assessment for when he is discharged. Best number to call is home telephone # and patient prefers afternoon appointments. Text contact information.  April Manson, RDN, LDN Registered Dietitian, [place] Part Time Remote (Usual office hours: Tuesday-Thursday) Mobile: [phone] Remote Office: [phone]   .

## 2022-09-30 NOTE — H&P (View-Only) (Signed)
                                             Consultation Note   Referring Provider:  [facility] PCP: Beck, Taylor B, NP Primary Gastroenterologist: Saw Dr. Cirigliano during Dec 2023 admission.   Reason for consultation: biliary obstruction   Hospital Day: 2  Assessment    Patient profile:  [name] [name] is a 67 y.o. male with a past medical history significant for nonischemic cardiomyopathy status post ICD placement, chronic systolic heart failure with an EF of 25%, hypertension and recently diagnosed malignant biliary stricture ( ? Cholangiocarcinoma). See PMH for any additional medical problems.   # Recently diagnosed malignant biliary stricture ( adenocarcinoma), ? Cholangiocarcinoma. No evidence for metastatic disease on CT scan. He had a plastic stent placed 12/22 and it appears to be occluded now. He established care with Oncology yesterday and plan is for probable radiation, +/- chemotherapy.   # Persistent diarrhea, mainly postprandial. Stool studies negative 09/17/22 so infectious etiology unlikely. EPI?  SIBO?  Of note, CT scan last month showed an abnormal area of thickening of CT scan.   # Severe hypoalbuminemia / malnutrition.   # Hypokalemia, resolved. K+ 4.1 today  # Nonischemic cardiomyopathy status post ICD placement, chronic systolic heart failure with an EF of 25%  # See PMH for additional medical problems  Plan   Needs ERCP with stent exchange tomorrow. The benefits and risks of ERCP with possible sphincterotomy not limited to cardiopulmonary complications of sedation, bleeding, infection, perforation,and pancreatitis were discussed with the patient who agrees to proceed.   Okay to eat today from GI standpoint. NPO after MN INR okay at 1.3 May need eventual flex sigmoidoscopy to evaluate sigmoid thickening on CT scan.    HPI   [name] was hospitalized late December with elevated liver chemistries, biliary duct dilation and diarrhea. Stool studies  were negative. Inpatient ERCP remarkable for severe lower CBD stricture and cytology + for adenocarcinoma. Plastic stent was placed. Had initial outpatient Oncology visit yesterday. Bilirubin was high again. Potassium was low. He was sent to ED.   Sha has continued to have diarrhea, almost every time he eats. No blood in stool. He says his appetite is okay. He has no abdominal pain. No nausea / vomiting. He isn't taking anything at home for the diarrhea  CEA elevated at 256 CA 19-9 normal   Significant studies this admission   Labs:  WBC 131. Hgb  9.8 Cr 0.8 Tbili 14.4 in ED >> 9.8 today ALk phos 625 AST 54 ALT 33 INR 1.3  Brief summary of imaging ( see full reports below) US - gallbladder sludge. Interval development of intra / extrahepatic biliary duct dilation. Debris occluding CBD and CBD stent   Previous GI Evaluation     09/17/22 ERCP -single severe biliary stricture was found in the lower third of the main bile duct. The stricture was malignant appearing. - The entire biliary tree was dilated. - Cells for cytology obtained in the lower third of the main duct. - A biliary sphincterotomy was performed. - One plastic stent was placed into the common bile duct.  Cytology >> malignant cells c/w adenocarcinoma  Recent Labs and Imaging US Abdomen Limited RUQ (LIVER/GB)  Result Date: 10/10/2022 CLINICAL DATA:  [id number] Biliary obstruction [id number] EXAM: ULTRASOUND ABDOMEN LIMITED RIGHT UPPER QUADRANT COMPARISON:  CT   abdomen pelvis 09/15/2022, ERCP 09/17/2022 FINDINGS: Gallbladder: Layering gallbladder sludge within the gallbladder lumen. Avascular masslike echogenic foci along the gallbladder wall measuring 2.4 x 3.2 x 2.3 cm. Possible gallbladder wall thickening. No sonographic Murphy sign noted by sonographer. Common bile duct: Diameter: 18 mm. Echogenic debris noted occluding the common bile duct. Echogenic debris also noted occluding the stent. Liver: Interval development of  intrahepatic biliary ductal dilatation. No focal lesion identified. Within normal limits in parenchymal echogenicity. Portal vein is patent on color Doppler imaging with normal direction of blood flow towards the liver. Other: Simple free fluid ascites. IMPRESSION: 1. Gallbladder sludge as well as avascular masslike echogenic foci along the gallbladder wall measuring 2.4 x 3.2 x 2.3 cm-Findings suggestive of inspissated gallbladder sludge. Possible gallbladder wall thickening. Negative sonographic Murphy sign reported. Correlate clinically for acute cholecystitis. 2. Interval development of intra and extrahepatic biliary ductal dilatation. Dilated common bile duct. Echogenic debris occluding both the common bile duct and the common bile duct stent. 3. Recommend surgical consultation. 4. Simple free fluid ascites. Electronically Signed   By: Morgane  Naveau M.D.   On: 10/11/2022 20:29   DG Chest 2 View  Result Date: 09/23/2022 CLINICAL DATA:  Recent hospitalization. History of CHF. No new symptoms. EXAM: CHEST - 2 VIEW COMPARISON:  09/07/2019. FINDINGS: Cardiac silhouette normal in size and configuration. Normal mediastinal and hilar contours. Single lead left anterior chest wall AICD is stable in well positioned. Clear lungs.  No pleural effusion or pneumothorax. Skeletal structures are unremarkable. IMPRESSION: No active cardiopulmonary disease. Electronically Signed   By: David  Ormond M.D.   On: 09/23/2022 12:53   DG ERCP  Result Date: 09/17/2022 CLINICAL DATA:  Biliary ductal dilation. EXAM: ERCP COMPARISON:  CT AP, 09/15/2022.  US Abdomen, 09/15/2022. FLUOROSCOPY: Exposure Index (as provided by the fluoroscopic device): 28.3 mGy Kerma FINDINGS: Multiple, limited oblique planar images of the RIGHT upper quadrant obtained C-arm. Images demonstrating flexible endoscopy, biliary duct cannulation, sphincterotomy, retrograde cholangiogram and plastic biliary stent placement. Intrahepatic biliary ductal  dilation is demonstrated. IMPRESSION: Fluoroscopic imaging for ERCP and plastic biliary stent placement. For complete description of intra procedural findings, please see performing service dictation. Electronically Signed   By: Jon  Mugweru M.D.   On: 09/17/2022 17:28   DG C-Arm 1-60 Min-No Report  Result Date: 09/17/2022 Fluoroscopy was utilized by the requesting physician.  No radiographic interpretation.   CT ABDOMEN PELVIS WO CONTRAST  Result Date: 09/15/2022 CLINICAL DATA:  Left-sided abdominal pain, diarrhea EXAM: CT ABDOMEN AND PELVIS WITHOUT CONTRAST TECHNIQUE: Multidetector CT imaging of the abdomen and pelvis was performed following the standard protocol without IV contrast. RADIATION DOSE REDUCTION: This exam was performed according to the departmental dose-optimization program which includes automated exposure control, adjustment of the mA and/or kV according to patient size and/or use of iterative reconstruction technique. COMPARISON:  Abdomen sonogram done earlier today, CT abdomen done on 11/27/2004. FINDINGS: Lower chest: Pacer leads are noted in place. Visualized lower lung fields are clear. Hepatobiliary: There is abnormal dilation of intrahepatic and extrahepatic bile ducts. Distal common bile duct in the head of the pancreas measures 14 mm. Gallbladder is distended. There is no wall thickening in gallbladder. There is no fluid around the gallbladder. Pancreas: No focal abnormalities are seen in pancreas. Evaluation is limited in this noncontrast study. Spleen: Unremarkable. Adrenals/Urinary Tract: Adrenals are unremarkable. There is no hydronephrosis. Small calcifications are seen in left renal artery branches. There are no demonstrable renal or ureteral stones. Urinary bladder is   not distended. Stomach/Bowel: Stomach is not distended. Small bowel loops are not dilated. Appendix is not distinctly seen. There is no pericecal inflammation. There is abnormal wall thickening in sigmoid  colon measuring up to 2.4 cm in thickness. Vascular/Lymphatic: There are calcifications in abdominal aorta and its major branches. There is aneurysmal dilation of infrarenal aorta measuring 4.6 x 4.1 cm. Evaluation of lumen is limited in this noncontrast study. There is aneurysmal dilation of right common iliac artery measuring 2.4 cm. There is aneurysmal dilation in left common iliac artery measuring 2.3 cm. There is no demonstrable retroperitoneal hematoma. No significant lymphadenopathy is seen. Reproductive: Prostate is enlarged. Other: There is no ascites or pneumoperitoneum. Umbilical hernia containing fat is seen. Musculoskeletal: Degenerative changes are noted in lumbar spine with disc space narrowing, bony spurs, facet hypertrophy and encroachment of neural foramina at multiple levels, more so at L4-L5 and L5-S1 levels. IMPRESSION: There is no evidence of intestinal obstruction or pneumoperitoneum. There is no hydronephrosis. There is abnormal dilation of intrahepatic and extrahepatic bile ducts. Distal common bile duct measures 1.4 cm. Evaluation of pancreas in the region of ampulla is limited in this noncontrast study. Findings may be related to noncalcified calculus in the distal common bile duct or stricture in the distal common bile duct. Follow-up MRI and MRCP may be considered. Distention of gallbladder may be due to stricture in distal common bile duct and possibly fasting state of the patient. There is abnormal wall thickening in sigmoid colon. Possibility of malignant neoplastic process in sigmoid colon is not excluded. Endoscopic correlation should be considered. There is aneurysmal dilation of infrarenal abdominal aorta measuring 4.6 cm in diameter. There is aneurysmal dilation of both common iliac arteries measuring up to 2.4 cm. Recommend follow-up every 6 months and vascular consultation.Reference: J Am Coll Radiol 2013;10:789-794. Enlarged prostate.  Lumbar spondylosis. Other findings as  described in the body of the report. Electronically Signed   By: Palani  Rathinasamy M.D.   On: 09/15/2022 21:45   US Abdomen Limited RUQ (LIVER/GB)  Result Date: 09/15/2022 CLINICAL DATA:  Pain right upper quadrant EXAM: ULTRASOUND ABDOMEN LIMITED RIGHT UPPER QUADRANT COMPARISON:  None Available. FINDINGS: Gallbladder: There are no demonstrable gallbladder stones. Technologist did not observe any tenderness over the gallbladder. There are low-level echoes in the lumen suggesting possible sludge. There is no fluid around the gallbladder. Common bile duct: Diameter: Common bile duct measures 1.5 cm. There is dilation of intrahepatic bile ducts. Distal common bile duct is obscured by bowel gas. Liver: There is increased echogenicity in liver. No focal abnormalities are seen in the visualized portions of liver. Portal vein is patent on color Doppler imaging with normal direction of blood flow towards the liver. Other: None. IMPRESSION: Sludge is seen in the lumen of gallbladder. There are no demonstrable gallbladder stones or imaging signs of acute cholecystitis. There is dilation of intrahepatic and visualized proximal extrahepatic bile ducts. Proximal common bile duct measures 1.5 cm in diameter. This may suggest stricture or calculus in the distal common bile duct. Distal common bile duct is obscured by bowel gas and not evaluated. If clinically warranted, follow-up CT may be considered. Electronically Signed   By: Palani  Rathinasamy M.D.   On: 09/15/2022 18:28    Labs:  Recent Labs    10/16/2022 1107 09/30/22 0405  WBC 14.3* 13.1*  HGB 11.5* 9.8*  HCT 33.2* 27.8*  PLT 265 205   Recent Labs    10/21/2022 1107 09/30/22 0405  NA 139 135    K 2.9* 4.1  CL 103 107  CO2 25 21*  GLUCOSE 124* 86  BUN 13 11  CREATININE 1.07 0.84  CALCIUM 8.8* 8.0*   Recent Labs    09/30/22 0405  PROT 5.2*  ALBUMIN 1.9*  AST 54*  ALT 33  ALKPHOS 625*  BILITOT 9.8*   No results for input(s): "HEPBSAG",  "HCVAB", "HEPAIGM", "HEPBIGM" in the last 72 hours. 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   Chronic systolic CHF (congestive heart failure) (HCC) 01/31/2015   Coronary artery disease    Hypertension    Nonischemic cardiomyopathy (HCC) 06/30/2015    Past Surgical History:  Procedure Laterality Date   BILIARY BRUSHING  09/17/2022   Procedure: BILIARY BRUSHING;  Surgeon: Gupta, Rajesh, MD;  Location: [facility];  Service: Gastroenterology;;   BILIARY STENT PLACEMENT  09/17/2022   Procedure: BILIARY STENT PLACEMENT;  Surgeon: Gupta, Rajesh, MD;  Location: [facility];  Service: Gastroenterology;;   CARDIAC CATHETERIZATION N/A 02/04/2015   Procedure: Right/Left Heart Cath and Coronary Angiography;  Surgeon: Dalton S McLean, MD;  Location: [facility];  Service: Cardiovascular;  Laterality: N/A;   CARDIAC DEFIBRILLATOR PLACEMENT  07/09/2015   CHALAZION EXCISION Left 01/16/2014   Procedure: MINOR EXCISION OF CHALAZION;  Surgeon: Thomas E Brewington Jr., MD;  Location: [facility];  Service: Ophthalmology;  Laterality: Left;   EP IMPLANTABLE DEVICE N/A 07/09/2015   Procedure: ICD Implant;  Surgeon: Steven C Klein, MD;  Location: [facility];  Service: Cardiovascular;  Laterality: N/A;   ERCP N/A 09/17/2022   Procedure: ENDOSCOPIC RETROGRADE CHOLANGIOPANCREATOGRAPHY (ERCP);  Surgeon: Gupta, Rajesh, MD;  Location: [facility];  Service: Gastroenterology;  Laterality: N/A;   EYE SURGERY Left ~ 2013   "eye infection"   SPHINCTEROTOMY  09/17/2022   Procedure: SPHINCTEROTOMY;  Surgeon: Gupta, Rajesh, MD;  Location: [facility];  Service: Gastroenterology;;    Family History  Problem Relation Age of Onset   Stroke Mother    Heart Problems Other     Prior to Admission medications   Medication Sig Start Date  End Date Taking? Authorizing Provider  aspirin 81 MG EC tablet TAKE 1 TABLET(81 MG) BY MOUTH DAILY. SWALLOW WHOLE Patient taking differently: Take 81 mg by mouth daily. TAKE 1 TABLET(81 MG) BY MOUTH DAILY. SWALLOW WHOLE 11/04/21  Yes McLean, Dalton S, MD  carvedilol (COREG) 3.125 MG tablet Take 1 tablet (3.125 mg total) by mouth 2 (two) times daily with a meal. 09/19/22  Yes Singh, Prashant K, MD  ezetimibe (ZETIA) 10 MG tablet TAKE 1 TABLET(10 MG) BY MOUTH DAILY 11/04/21  Yes McLean, Dalton S, MD  isosorbide mononitrate (IMDUR) 30 MG 24 hr tablet Take 0.5 tablets (15 mg total) by mouth daily. 09/20/22  Yes Singh, Prashant K, MD  pantoprazole (PROTONIX) 40 MG tablet Take 1 tablet (40 mg total) by mouth daily. Patient taking differently: Take 40 mg by mouth daily as needed (indigestion). 09/20/22  Yes Singh, Prashant K, MD  rosuvastatin (CRESTOR) 40 MG tablet TAKE 1 TABLET(40 MG) BY MOUTH DAILY 11/04/21  Yes McLean, Dalton S, MD  dapagliflozin propanediol (FARXIGA) 10 MG TABS tablet Take 1 tablet (10 mg total) by mouth daily before breakfast. Patient not taking: Reported on 10/17/2022 11/04/21   McLean, Dalton S, MD  digoxin (LANOXIN) 0.125 MG tablet Take 1 tablet (0.125   mg total) by mouth daily. Patient not taking: Reported on 10/11/2022 11/04/21   McLean, Dalton S, MD  triamcinolone ointment (KENALOG) 0.5 % Apply 1 application topically 2 (two) times daily. Patient not taking: Reported on 10/27/2022 07/11/18   Gomez, Roger David, PA-C    Current Facility-Administered Medications  Medication Dose Route Frequency Provider Last Rate Last Admin   carvedilol (COREG) tablet 3.125 mg  3.125 mg Oral BID WC Patel, Vishal R, MD       digoxin (LANOXIN) tablet 0.125 mg  0.125 mg Oral Daily Patel, Vishal R, MD       ondansetron (ZOFRAN) tablet 4 mg  4 mg Oral Q6H PRN Patel, Vishal R, MD       Or   ondansetron (ZOFRAN) injection 4 mg  4 mg Intravenous Q6H PRN Patel, Vishal R, MD       pantoprazole (PROTONIX) EC tablet 40  mg  40 mg Oral Daily Patel, Vishal R, MD       sodium chloride flush (NS) 0.9 % injection 3 mL  3 mL Intravenous Q12H Patel, Vishal R, MD   3 mL at 10/07/2022 2132   Current Outpatient Medications  Medication Sig Dispense Refill   aspirin 81 MG EC tablet TAKE 1 TABLET(81 MG) BY MOUTH DAILY. SWALLOW WHOLE (Patient taking differently: Take 81 mg by mouth daily. TAKE 1 TABLET(81 MG) BY MOUTH DAILY. SWALLOW WHOLE) 30 tablet 11   carvedilol (COREG) 3.125 MG tablet Take 1 tablet (3.125 mg total) by mouth 2 (two) times daily with a meal. 60 tablet 0   ezetimibe (ZETIA) 10 MG tablet TAKE 1 TABLET(10 MG) BY MOUTH DAILY 90 tablet 3   isosorbide mononitrate (IMDUR) 30 MG 24 hr tablet Take 0.5 tablets (15 mg total) by mouth daily. 30 tablet 0   pantoprazole (PROTONIX) 40 MG tablet Take 1 tablet (40 mg total) by mouth daily. (Patient taking differently: Take 40 mg by mouth daily as needed (indigestion).) 30 tablet 0   rosuvastatin (CRESTOR) 40 MG tablet TAKE 1 TABLET(40 MG) BY MOUTH DAILY 90 tablet 3   dapagliflozin propanediol (FARXIGA) 10 MG TABS tablet Take 1 tablet (10 mg total) by mouth daily before breakfast. (Patient not taking: Reported on 10/18/2022) 30 tablet 11   digoxin (LANOXIN) 0.125 MG tablet Take 1 tablet (0.125 mg total) by mouth daily. (Patient not taking: Reported on 10/14/2022) 90 tablet 3   triamcinolone ointment (KENALOG) 0.5 % Apply 1 application topically 2 (two) times daily. (Patient not taking: Reported on 10/11/2022) 15 g 0    Allergies as of 10/22/2022   (No Known Allergies)    Social History   Socioeconomic History   Marital status: Single    Spouse name: Not on file   Number of children: Not on file   Years of education: Not on file   Highest education level: Not on file  Occupational History   Not on file  Tobacco Use   Smoking status: Every Day    Packs/day: 1.00    Years: 40.00    Total pack years: 40.00    Types: Cigarettes   Smokeless tobacco: Never  Vaping Use    Vaping Use: Every day  Substance and Sexual Activity   Alcohol use: Not Currently    Comment: 07/09/2015 "I haven't had a drink since last of 12/2014"   Drug use: Not Currently    Types: "Crack" cocaine, Marijuana    Comment: 07/09/2015 "I did drugs a long time;ago  quit in the mid 1980's"     Sexual activity: Not Currently  Other Topics Concern   Not on file  Social History Narrative   Not on file   Social Determinants of Health   Financial Resource Strain: Not on file  Food Insecurity: Food Insecurity Present (09/18/2022)   Hunger Vital Sign    Worried About Running Out of Food in the Last Year: Never true    Ran Out of Food in the Last Year: Sometimes true  Transportation Needs: No Transportation Needs (10/16/2022)   PRAPARE - Transportation    Lack of Transportation (Medical): No    Lack of Transportation (Non-Medical): No  Physical Activity: Not on file  Stress: Not on file  Social Connections: Not on file  Intimate Partner Violence: Not At Risk (10/18/2022)   Humiliation, Afraid, Rape, and Kick questionnaire    Fear of Current or Ex-Partner: No    Emotionally Abused: No    Physically Abused: No    Sexually Abused: No    Review of Systems: All systems reviewed and negative except where noted in HPI.  Physical Exam: Vital signs in last 24 hours: Temp:  [97.5 F (36.4 C)-99.1 F (37.3 C)] 98.3 F (36.8 C) (01/04 0741) Pulse Rate:  [52-94] 70 (01/04 0630) Resp:  [14-20] 17 (01/04 0630) BP: (87-130)/(67-93) 116/69 (01/04 0630) SpO2:  [97 %-100 %] 97 % (01/04 0630) Weight:  [58.2 kg] 58.2 kg (01/03 1140) Last BM Date : 09/28/2022  General:  Alert thinmale in NAD Psych:  Pleasant, cooperative. Normal mood and affect Eyes: Pupils equal Ears:  Normal auditory acuity Nose: No deformity, discharge or lesions Neck:  Supple, no masses felt Lungs:  Clear to auscultation.  Heart:  Regular rate, regular rhythm.  Abdomen:  Soft, nondistended, nontender, active bowel sounds, no  masses felt Rectal :  Deferred Msk: Symmetrical without gross deformities.  Neurologic:  Alert, oriented, grossly normal neurologically Extremities : No edema Skin:  Intact without significant lesions.    Intake/Output from previous day: 01/03 0701 - 01/04 0700 In: 498.7 [I.V.:498.7] Out: -  Intake/Output this shift:  No intake/output data recorded.    Principal Problem:   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HCC) Active Problems:   Chronic systolic CHF (congestive heart failure) (HCC)   Nonischemic cardiomyopathy (HCC)   Hypokalemia   ICD (implantable cardioverter-defibrillator) in place   Common bile duct (CBD) stricture    Alianny Toelle, NP-C @  09/30/2022, 8:59 AM     

## 2022-09-30 NOTE — Progress Notes (Addendum)
PROGRESS NOTE    [name] [name]  ZTI:[id number] DOB: Nov 01, 1955 DOA: 10/19/2022 PCP: Terrilyn Saver, NP     Brief Narrative:   [name] [name] is a 67 y.o. 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 after midnight, hold off hydration due to history of CHF  sigmoid abnormality perform sigmoidoscopy at same time as ERCP versus performing a full colonoscopy on the day after ERCP.  Plan per GI  Normocytic anemia -Monitor signs of bleeding, check anemia labs -Hgb 9.8   Chronic systolic CHF s/p ICD/Nonischemic cardiomyopathy/chronic systolic CHF/CAD   Last EF 25% in May 2023. Hypovolemic on admission,. Received gentle hydration initially, off IV fluids for now --Monitor strict I/O's and daily weights -Continue Coreg 3.125 mg twice daily -Continue digoxin 0.125 mg daily -Hold Farxiga and Imdur for now, resume when able -Hold statins for now due to elevation of LFT   Hypokalemia: Replaced, normalized, mag 1.9  CKD 2 Creatinine at baseline, renal dosing  meds  Left leg edema, venous doppler to rule out DVT  FTT, generalized weakness, will get PT eval  I have Reviewed nursing notes, Vitals, pain scores, I/o's, Lab results and  imaging results since pt's last encounter, details please see discussion above  I ordered the following labs:  Unresulted Labs (From admission, onward)     Start     Ordered   09/28/2022 0500  CBC with Differential/Platelet  Tomorrow morning,   R        09/30/22 1640   09/28/2022 0500  Comprehensive metabolic panel  Tomorrow morning,   R        09/30/22 1640   09/30/2022 0500  Magnesium  Tomorrow morning,   R        09/30/22 1640   10/27/2022 0500  Phosphorus  Tomorrow morning,   R        09/30/22 1640   10/25/2022 0500  Iron and TIBC  Tomorrow morning,   R        09/30/22 1651   09/30/2022 0500  Reticulocytes  Tomorrow morning,   R        09/30/22 1651   10/11/2022 0500  Vitamin B12  Tomorrow morning,   R        09/30/22 1651             DVT prophylaxis: SCDs Start: 10/12/2022 1928   Code Status:   Code Status: Full Code  Family Communication: family at bedside  Disposition:   Dispo: The patient is from: home              Anticipated d/c is to: home  Anticipated d/c date is: >24hrs  Antimicrobials:   Anti-infectives (From admission, onward)    None           Objective: Vitals:   09/30/22 1111 09/30/22 1115 09/30/22 1148 09/30/22 1515  BP: 124/84 126/81  116/79  Pulse: 76 69  88  Resp: 12   14  Temp:   98 F (36.7 C)   TempSrc:   Oral   SpO2: 92% (!) 85%  98%    Intake/Output Summary (Last 24 hours) at 09/30/2022 1651 Last data filed at 09/30/2022 0551 Gross per 24 hour  Intake 498.69 ml  Output --  Net 498.69 ml   There were no vitals filed for this visit.  Examination:  General exam: weak, aaox3 Respiratory system: Clear to auscultation. Respiratory effort normal. Cardiovascular system:  RRR.  Gastrointestinal system: Abdomen is nondistended, soft and nontender.   Normal bowel sounds heard. Central nervous system: Alert and oriented. No focal neurological deficits. Extremities:  left leg edema  Skin: No rashes, lesions or ulcers Psychiatry: Judgement and insight appear normal. Mood & affect appropriate.     Data Reviewed: I have personally reviewed  labs and visualized  imaging studies since the last encounter and formulate the plan        Scheduled Meds:  carvedilol  3.125 mg Oral BID WC   digoxin  0.125 mg Oral Daily   pantoprazole  40 mg Oral Daily   sodium chloride flush  3 mL Intravenous Q12H   Continuous Infusions:   LOS: 1 day     Florencia Reasons, MD PhD FACP [facility]  Available via Epic secure chat 7am-7pm for nonurgent issues Please page for urgent issues To page the attending provider between 7A-7P or the covering provider during after hours 7P-7A, please log into the web site [email or web address] and access using universal [place] password for that web site. If you do not have the password, please call the hospital operator.    09/30/2022, 4:51 PM

## 2022-10-01 ENCOUNTER — Inpatient Hospital Stay (HOSPITAL_COMMUNITY): Payer: Medicare Other | Admitting: Certified Registered Nurse Anesthetist

## 2022-10-01 ENCOUNTER — Encounter (HOSPITAL_COMMUNITY): Admission: EM | Disposition: E | Payer: Self-pay | Source: Home / Self Care | Attending: Internal Medicine

## 2022-10-01 ENCOUNTER — Inpatient Hospital Stay (HOSPITAL_COMMUNITY): Payer: Medicare Other

## 2022-10-01 ENCOUNTER — Encounter (HOSPITAL_COMMUNITY): Payer: Self-pay | Admitting: Internal Medicine

## 2022-10-01 DIAGNOSIS — K838 Other specified diseases of biliary tract: Secondary | ICD-10-CM

## 2022-10-01 DIAGNOSIS — I110 Hypertensive heart disease with heart failure: Secondary | ICD-10-CM

## 2022-10-01 DIAGNOSIS — R197 Diarrhea, unspecified: Secondary | ICD-10-CM

## 2022-10-01 DIAGNOSIS — C241 Malignant neoplasm of ampulla of Vater: Secondary | ICD-10-CM | POA: Diagnosis not present

## 2022-10-01 DIAGNOSIS — T85590A Other mechanical complication of bile duct prosthesis, initial encounter: Secondary | ICD-10-CM

## 2022-10-01 DIAGNOSIS — R609 Edema, unspecified: Secondary | ICD-10-CM

## 2022-10-01 DIAGNOSIS — K229 Disease of esophagus, unspecified: Secondary | ICD-10-CM

## 2022-10-01 DIAGNOSIS — K831 Obstruction of bile duct: Secondary | ICD-10-CM | POA: Diagnosis not present

## 2022-10-01 DIAGNOSIS — E876 Hypokalemia: Secondary | ICD-10-CM

## 2022-10-01 DIAGNOSIS — F17210 Nicotine dependence, cigarettes, uncomplicated: Secondary | ICD-10-CM

## 2022-10-01 DIAGNOSIS — I428 Other cardiomyopathies: Secondary | ICD-10-CM | POA: Diagnosis not present

## 2022-10-01 DIAGNOSIS — Z9911 Dependence on respirator [ventilator] status: Secondary | ICD-10-CM

## 2022-10-01 DIAGNOSIS — I739 Peripheral vascular disease, unspecified: Secondary | ICD-10-CM

## 2022-10-01 DIAGNOSIS — K449 Diaphragmatic hernia without obstruction or gangrene: Secondary | ICD-10-CM

## 2022-10-01 DIAGNOSIS — I5022 Chronic systolic (congestive) heart failure: Secondary | ICD-10-CM

## 2022-10-01 DIAGNOSIS — R970 Elevated carcinoembryonic antigen [CEA]: Secondary | ICD-10-CM | POA: Diagnosis not present

## 2022-10-01 HISTORY — PX: REMOVAL OF STONES: SHX5545

## 2022-10-01 HISTORY — PX: STENT REMOVAL: SHX6421

## 2022-10-01 HISTORY — PX: ESOPHAGEAL BRUSHING: SHX6842

## 2022-10-01 HISTORY — PX: ERCP: SHX5425

## 2022-10-01 HISTORY — PX: BILIARY STENT PLACEMENT: SHX5538

## 2022-10-01 LAB — BLOOD GAS, ARTERIAL
Acid-base deficit: 13.4 mmol/L — ABNORMAL HIGH (ref 0.0–2.0)
Bicarbonate: 13.7 mmol/L — ABNORMAL LOW (ref 20.0–28.0)
Drawn by: 560031
FIO2: 100 %
MECHVT: 630 mL
O2 Saturation: 100 %
PEEP: 5 cmH2O
Patient temperature: 36.4
RATE: 16 resp/min
pCO2 arterial: 34 mmHg (ref 32–48)
pH, Arterial: 7.21 — ABNORMAL LOW (ref 7.35–7.45)
pO2, Arterial: 395 mmHg — ABNORMAL HIGH (ref 83–108)

## 2022-10-01 LAB — GLUCOSE, CAPILLARY
Glucose-Capillary: 54 mg/dL — ABNORMAL LOW (ref 70–99)
Glucose-Capillary: <10 mg/dL — CL (ref 70–99)
Glucose-Capillary: 183 mg/dL — ABNORMAL HIGH (ref 70–99)

## 2022-10-01 LAB — URINALYSIS, ROUTINE W REFLEX MICROSCOPIC
Glucose, UA: NEGATIVE mg/dL
Ketones, ur: NEGATIVE mg/dL
Leukocytes,Ua: NEGATIVE
Nitrite: NEGATIVE
Protein, ur: 30 mg/dL — AB
Specific Gravity, Urine: 1.009 (ref 1.005–1.030)
pH: 7 (ref 5.0–8.0)

## 2022-10-01 LAB — COMPREHENSIVE METABOLIC PANEL
ALT: 60 U/L — ABNORMAL HIGH (ref 0–44)
ALT: 70 U/L — ABNORMAL HIGH (ref 0–44)
AST: 162 U/L — ABNORMAL HIGH (ref 15–41)
AST: 216 U/L — ABNORMAL HIGH (ref 15–41)
Albumin: 2 g/dL — ABNORMAL LOW (ref 3.5–5.0)
Albumin: 1.8 g/dL — ABNORMAL LOW (ref 3.5–5.0)
Alkaline Phosphatase: 889 U/L — ABNORMAL HIGH (ref 38–126)
Alkaline Phosphatase: 835 U/L — ABNORMAL HIGH (ref 38–126)
Anion gap: 12 (ref 5–15)
Anion gap: 11 (ref 5–15)
BUN: 10 mg/dL (ref 8–23)
BUN: 11 mg/dL (ref 8–23)
CO2: 19 mmol/L — ABNORMAL LOW (ref 22–32)
CO2: 18 mmol/L — ABNORMAL LOW (ref 22–32)
Calcium: 8.2 mg/dL — ABNORMAL LOW (ref 8.9–10.3)
Calcium: 7.9 mg/dL — ABNORMAL LOW (ref 8.9–10.3)
Chloride: 105 mmol/L (ref 98–111)
Chloride: 106 mmol/L (ref 98–111)
Creatinine, Ser: 1.02 mg/dL (ref 0.61–1.24)
Creatinine, Ser: 1.15 mg/dL (ref 0.61–1.24)
GFR, Estimated: >60 mL/min (ref >60)
GFR, Estimated: >60 mL/min (ref >60)
Glucose, Bld: 51 mg/dL — ABNORMAL LOW (ref 70–99)
Glucose, Bld: 55 mg/dL — ABNORMAL LOW (ref 70–99)
Potassium: 2.7 mmol/L — CL (ref 3.5–5.1)
Potassium: 4.2 mmol/L (ref 3.5–5.1)
Sodium: 136 mmol/L (ref 135–145)
Sodium: 135 mmol/L (ref 135–145)
Total Bilirubin: 12 mg/dL — ABNORMAL HIGH (ref 0.3–1.2)
Total Bilirubin: 11.1 mg/dL — ABNORMAL HIGH (ref 0.3–1.2)
Total Protein: 5.3 g/dL — ABNORMAL LOW (ref 6.5–8.1)
Total Protein: 5 g/dL — ABNORMAL LOW (ref 6.5–8.1)

## 2022-10-01 LAB — ECHOCARDIOGRAM COMPLETE
AR max vel: 3.84 cm2
AV Area VTI: 3.26 cm2
AV Area mean vel: 3.47 cm2
AV Mean grad: 1 mmHg
AV Peak grad: 2.1 mmHg
Ao pk vel: 0.73 m/s
Area-P 1/2: 6.12 cm2
Calc EF: 8.1 %
Height: 73 in
MV M vel: 3.14 m/s
MV Peak grad: 39.4 mmHg
P 1/2 time: 445 msec
S' Lateral: 5.9 cm
Single Plane A2C EF: 5.4 %
Single Plane A4C EF: 8.5 %
Weight: 2206.4 oz

## 2022-10-01 LAB — BLOOD GAS, VENOUS
Acid-base deficit: 12 mmol/L — ABNORMAL HIGH (ref 0.0–2.0)
Bicarbonate: 16 mmol/L — ABNORMAL LOW (ref 20.0–28.0)
O2 Saturation: 68.3 %
Patient temperature: 37
pCO2, Ven: 43 mmHg — ABNORMAL LOW (ref 44–60)
pH, Ven: 7.18 — CL (ref 7.25–7.43)
pO2, Ven: 44 mmHg (ref 32–45)

## 2022-10-01 LAB — CBC WITH DIFFERENTIAL/PLATELET
Abs Immature Granulocytes: 1.11 10*3/uL — ABNORMAL HIGH (ref 0.00–0.07)
Basophils Absolute: 0.1 10*3/uL (ref 0.0–0.1)
Basophils Relative: 0 %
Eosinophils Absolute: 0 10*3/uL (ref 0.0–0.5)
Eosinophils Relative: 0 %
HCT: 32.7 % — ABNORMAL LOW (ref 39.0–52.0)
Hemoglobin: 11.2 g/dL — ABNORMAL LOW (ref 13.0–17.0)
Immature Granulocytes: 3 %
Lymphocytes Relative: 3 %
Lymphs Abs: 1 10*3/uL (ref 0.7–4.0)
MCH: 33.4 pg (ref 26.0–34.0)
MCHC: 34.3 g/dL (ref 30.0–36.0)
MCV: 97.6 fL (ref 80.0–100.0)
Monocytes Absolute: 0.8 10*3/uL (ref 0.1–1.0)
Monocytes Relative: 2 %
Neutro Abs: 35.6 10*3/uL — ABNORMAL HIGH (ref 1.7–7.7)
Neutrophils Relative %: 92 %
Platelets: 196 10*3/uL (ref 150–400)
RBC: 3.35 MIL/uL — ABNORMAL LOW (ref 4.22–5.81)
RDW: 14.8 % (ref 11.5–15.5)
WBC: 38.7 10*3/uL — ABNORMAL HIGH (ref 4.0–10.5)
nRBC: 0 % (ref 0.0–0.2)

## 2022-10-01 LAB — RETICULOCYTES
Immature Retic Fract: 29.7 % — ABNORMAL HIGH (ref 2.3–15.9)
RBC.: 3.36 MIL/uL — ABNORMAL LOW (ref 4.22–5.81)
Retic Count, Absolute: 104.8 10*3/uL (ref 19.0–186.0)
Retic Ct Pct: 3.1 % (ref 0.4–3.1)

## 2022-10-01 LAB — PHOSPHORUS: Phosphorus: 3 mg/dL (ref 2.5–4.6)

## 2022-10-01 LAB — IRON AND TIBC
Iron: 15 ug/dL — ABNORMAL LOW (ref 45–182)
Saturation Ratios: 15 % — ABNORMAL LOW (ref 17.9–39.5)
TIBC: 98 ug/dL — ABNORMAL LOW (ref 250–450)
UIBC: 83 ug/dL

## 2022-10-01 LAB — MAGNESIUM: Magnesium: 1.5 mg/dL — ABNORMAL LOW (ref 1.7–2.4)

## 2022-10-01 LAB — TROPONIN I (HIGH SENSITIVITY)
Troponin I (High Sensitivity): 41 ng/L — ABNORMAL HIGH (ref <18)
Troponin I (High Sensitivity): 57 ng/L — ABNORMAL HIGH (ref <18)

## 2022-10-01 LAB — LACTIC ACID, PLASMA: Lactic Acid, Venous: 6.6 mmol/L (ref 0.5–1.9)

## 2022-10-01 LAB — VITAMIN B12: Vitamin B-12: 419 pg/mL (ref 180–914)

## 2022-10-01 LAB — MRSA NEXT GEN BY PCR, NASAL: MRSA by PCR Next Gen: NOT DETECTED

## 2022-10-01 LAB — POTASSIUM: Potassium: 3.2 mmol/L — ABNORMAL LOW (ref 3.5–5.1)

## 2022-10-01 SURGERY — ERCP, WITH INTERVENTION IF INDICATED
Anesthesia: General

## 2022-10-01 MED ORDER — PIPERACILLIN-TAZOBACTAM 3.375 G IVPB
3.3750 g | Freq: Three times a day (TID) | INTRAVENOUS | Status: DC
  Administered 2022-10-01: 3.375 g via INTRAVENOUS
  Filled 2022-10-01: qty 50

## 2022-10-01 MED ORDER — DEXTROSE 50 % IV SOLN
INTRAVENOUS | Status: AC
  Administered 2022-10-01: 50 mL via INTRAVENOUS
  Filled 2022-10-01: qty 100

## 2022-10-01 MED ORDER — ONDANSETRON HCL 4 MG/2ML IJ SOLN
INTRAMUSCULAR | Status: DC | PRN
  Administered 2022-10-01: 4 mg via INTRAVENOUS

## 2022-10-01 MED ORDER — DEXTROSE 10 % IV SOLN: INTRAVENOUS | Status: DC

## 2022-10-01 MED ORDER — ROCURONIUM BROMIDE 10 MG/ML (PF) SYRINGE
PREFILLED_SYRINGE | INTRAVENOUS | Status: DC | PRN
  Administered 2022-10-01: 50 mg via INTRAVENOUS

## 2022-10-01 MED ORDER — EPINEPHRINE HCL 5 MG/250ML IV SOLN IN NS
0.5000 ug/min | INTRAVENOUS | Status: DC
  Administered 2022-10-01: 5 ug/min via INTRAVENOUS

## 2022-10-01 MED ORDER — DOCUSATE SODIUM 50 MG/5ML PO LIQD
100.0000 mg | Freq: Two times a day (BID) | ORAL | Status: DC
  Administered 2022-10-02 – 2022-10-03 (×2): 100 mg
  Filled 2022-10-01 (×4): qty 10

## 2022-10-01 MED ORDER — EPINEPHRINE HCL 5 MG/250ML IV SOLN IN NS
INTRAVENOUS | Status: DC | PRN
  Administered 2022-10-01: 5 ug/min via INTRAVENOUS

## 2022-10-01 MED ORDER — VANCOMYCIN HCL 1500 MG/300ML IV SOLN
1500.0000 mg | Freq: Once | INTRAVENOUS | Status: AC
  Administered 2022-10-01: 1500 mg via INTRAVENOUS
  Filled 2022-10-01: qty 300

## 2022-10-01 MED ORDER — CIPROFLOXACIN IN D5W 400 MG/200ML IV SOLN
INTRAVENOUS | Status: AC
  Filled 2022-10-01: qty 200

## 2022-10-01 MED ORDER — PHENYLEPHRINE HCL-NACL 20-0.9 MG/250ML-% IV SOLN
0.0000 ug/min | INTRAVENOUS | Status: DC
  Administered 2022-10-01 (×2): 120 ug/min via INTRAVENOUS
  Filled 2022-10-01: qty 250

## 2022-10-01 MED ORDER — VANCOMYCIN HCL IN DEXTROSE 1-5 GM/200ML-% IV SOLN: 1000.0000 mg | INTRAVENOUS | Status: DC

## 2022-10-01 MED ORDER — FLUCONAZOLE 200 MG PO TABS: 200.0000 mg | ORAL_TABLET | Freq: Once | ORAL | Status: DC

## 2022-10-01 MED ORDER — VASOPRESSIN 20 UNIT/ML IV SOLN
INTRAVENOUS | Status: DC | PRN
  Administered 2022-10-01: 2 [IU] via INTRAVENOUS
  Administered 2022-10-01: 1 [IU] via INTRAVENOUS
  Administered 2022-10-01 (×6): 2 [IU] via INTRAVENOUS
  Administered 2022-10-01: 1 [IU] via INTRAVENOUS
  Administered 2022-10-01: 3 [IU] via INTRAVENOUS
  Administered 2022-10-01: 1 [IU] via INTRAVENOUS

## 2022-10-01 MED ORDER — HYDROCORTISONE SOD SUC (PF) 100 MG IJ SOLR
100.0000 mg | Freq: Three times a day (TID) | INTRAMUSCULAR | Status: DC
  Administered 2022-10-01 – 2022-10-12 (×33): 100 mg via INTRAVENOUS
  Filled 2022-10-01 (×32): qty 2

## 2022-10-01 MED ORDER — ESMOLOL HCL 100 MG/10ML IV SOLN
INTRAVENOUS | Status: DC | PRN
  Administered 2022-10-01: 10 mg via INTRAVENOUS

## 2022-10-01 MED ORDER — EPINEPHRINE 1 MG/10ML IJ SOSY
PREFILLED_SYRINGE | INTRAMUSCULAR | Status: DC | PRN
  Administered 2022-10-01 (×3): 30 ug via INTRAVENOUS

## 2022-10-01 MED ORDER — SODIUM CHLORIDE 0.9 % IV SOLN: INTRAVENOUS | Status: DC

## 2022-10-01 MED ORDER — METRONIDAZOLE 500 MG/100ML IV SOLN
500.0000 mg | Freq: Two times a day (BID) | INTRAVENOUS | Status: DC
  Administered 2022-10-01 – 2022-10-03 (×4): 500 mg via INTRAVENOUS
  Filled 2022-10-01 (×4): qty 100

## 2022-10-01 MED ORDER — PHENYLEPHRINE 80 MCG/ML (10ML) SYRINGE FOR IV PUSH (FOR BLOOD PRESSURE SUPPORT): 80.0000 ug | PREFILLED_SYRINGE | Freq: Once | INTRAVENOUS | Status: DC | PRN

## 2022-10-01 MED ORDER — VASOPRESSIN 20 UNITS/100 ML INFUSION FOR SHOCK
0.0000 [IU]/min | INTRAVENOUS | Status: DC
  Administered 2022-10-01 – 2022-10-04 (×6): 0.03 [IU]/min via INTRAVENOUS
  Filled 2022-10-01 (×6): qty 100

## 2022-10-01 MED ORDER — EPINEPHRINE HCL 5 MG/250ML IV SOLN IN NS
0.5000 ug/min | INTRAVENOUS | Status: DC
  Administered 2022-10-01: 5 ug/min via INTRAVENOUS
  Filled 2022-10-01: qty 250

## 2022-10-01 MED ORDER — DEXTROSE 50 % IV SOLN: 50.0000 mL | Freq: Once | INTRAVENOUS | Status: AC

## 2022-10-01 MED ORDER — LACTATED RINGERS IV BOLUS
1000.0000 mL | Freq: Once | INTRAVENOUS | Status: AC
  Administered 2022-10-01: 1000 mL via INTRAVENOUS

## 2022-10-01 MED ORDER — SODIUM CHLORIDE 0.9 % IV SOLN
INTRAVENOUS | Status: DC | PRN
  Administered 2022-10-01: 50 mL

## 2022-10-01 MED ORDER — VASOPRESSIN 20 UNIT/ML IV SOLN
INTRAVENOUS | Status: AC
  Filled 2022-10-01: qty 1

## 2022-10-01 MED ORDER — PHENYLEPHRINE HCL-NACL 20-0.9 MG/250ML-% IV SOLN
INTRAVENOUS | Status: DC | PRN
  Administered 2022-10-01: 30 ug/min via INTRAVENOUS

## 2022-10-01 MED ORDER — FENTANYL CITRATE (PF) 100 MCG/2ML IJ SOLN
INTRAMUSCULAR | Status: DC | PRN
  Administered 2022-10-01 (×2): 50 ug via INTRAVENOUS

## 2022-10-01 MED ORDER — DICLOFENAC SUPPOSITORY 100 MG
RECTAL | Status: AC
  Filled 2022-10-01: qty 1

## 2022-10-01 MED ORDER — ORAL CARE MOUTH RINSE
15.0000 mL | OROMUCOSAL | Status: DC
  Administered 2022-10-01 – 2022-10-03 (×25): 15 mL via OROMUCOSAL

## 2022-10-01 MED ORDER — CHLORHEXIDINE GLUCONATE CLOTH 2 % EX PADS
6.0000 | MEDICATED_PAD | Freq: Every day | CUTANEOUS | Status: DC
  Administered 2022-10-01 – 2022-10-14 (×13): 6 via TOPICAL

## 2022-10-01 MED ORDER — POLYETHYLENE GLYCOL 3350 17 G PO PACK
17.0000 g | PACK | Freq: Every day | ORAL | Status: DC
  Administered 2022-10-02: 17 g
  Filled 2022-10-01 (×3): qty 1

## 2022-10-01 MED ORDER — FENTANYL CITRATE (PF) 100 MCG/2ML IJ SOLN
INTRAMUSCULAR | Status: AC
  Filled 2022-10-01: qty 2

## 2022-10-01 MED ORDER — LIDOCAINE 2% (20 MG/ML) 5 ML SYRINGE
INTRAMUSCULAR | Status: DC | PRN
  Administered 2022-10-01: 80 mg via INTRAVENOUS

## 2022-10-01 MED ORDER — GLUCAGON HCL RDNA (DIAGNOSTIC) 1 MG IJ SOLR
INTRAMUSCULAR | Status: AC
  Filled 2022-10-01: qty 2

## 2022-10-01 MED ORDER — PANTOPRAZOLE SODIUM 40 MG PO TBEC
40.0000 mg | DELAYED_RELEASE_TABLET | Freq: Two times a day (BID) | ORAL | Status: DC
  Filled 2022-10-01: qty 1

## 2022-10-01 MED ORDER — POTASSIUM CHLORIDE 20 MEQ PO PACK
40.0000 meq | PACK | ORAL | Status: AC
  Filled 2022-10-01: qty 2

## 2022-10-01 MED ORDER — PROPOFOL 1000 MG/100ML IV EMUL
0.0000 ug/kg/min | INTRAVENOUS | Status: DC
  Administered 2022-10-01: 5 ug/kg/min via INTRAVENOUS
  Administered 2022-10-02: 15.974 ug/kg/min via INTRAVENOUS
  Administered 2022-10-02: 15 ug/kg/min via INTRAVENOUS
  Administered 2022-10-03: 19.968 ug/kg/min via INTRAVENOUS
  Filled 2022-10-01 (×3): qty 100

## 2022-10-01 MED ORDER — PERFLUTREN LIPID MICROSPHERE
1.0000 mL | INTRAVENOUS | Status: AC | PRN
  Administered 2022-10-01: 2 mL via INTRAVENOUS

## 2022-10-01 MED ORDER — SODIUM CHLORIDE 0.9 % IV SOLN
2.0000 g | Freq: Two times a day (BID) | INTRAVENOUS | Status: DC
  Administered 2022-10-01 – 2022-10-03 (×3): 2 g via INTRAVENOUS
  Filled 2022-10-01 (×3): qty 12.5

## 2022-10-01 MED ORDER — SODIUM CHLORIDE 0.9 % IV SOLN: 1.5000 g | Freq: Three times a day (TID) | INTRAVENOUS | Status: DC

## 2022-10-01 MED ORDER — LACTATED RINGERS IV SOLN: INTRAVENOUS | Status: DC | PRN

## 2022-10-01 MED ORDER — FLUCONAZOLE 100 MG PO TABS: 100.0000 mg | ORAL_TABLET | Freq: Every day | ORAL | Status: DC

## 2022-10-01 MED ORDER — ETOMIDATE 2 MG/ML IV SOLN
INTRAVENOUS | Status: DC | PRN
  Administered 2022-10-01: 10 mg via INTRAVENOUS

## 2022-10-01 MED ORDER — HYDROMORPHONE HCL 1 MG/ML IJ SOLN
1.0000 mg | INTRAMUSCULAR | Status: DC | PRN
  Administered 2022-10-09 – 2022-10-14 (×8): 1 mg via INTRAVENOUS
  Filled 2022-10-01 (×8): qty 1

## 2022-10-01 MED ORDER — VASOPRESSIN 20 UNITS/100 ML INFUSION FOR SHOCK
INTRAVENOUS | Status: AC
  Filled 2022-10-01: qty 100

## 2022-10-01 MED ORDER — POTASSIUM CHLORIDE 10 MEQ/100ML IV SOLN
10.0000 meq | INTRAVENOUS | Status: AC
  Administered 2022-10-01 (×2): 10 meq via INTRAVENOUS
  Filled 2022-10-01 (×2): qty 100

## 2022-10-01 MED ORDER — SUGAMMADEX SODIUM 200 MG/2ML IV SOLN
INTRAVENOUS | Status: DC | PRN
  Administered 2022-10-01: 200 mg via INTRAVENOUS

## 2022-10-01 MED ORDER — ORAL CARE MOUTH RINSE: 15.0000 mL | OROMUCOSAL | Status: DC | PRN

## 2022-10-01 MED ORDER — DICLOFENAC SUPPOSITORY 100 MG
RECTAL | Status: DC | PRN
  Administered 2022-10-01: 100 mg via RECTAL

## 2022-10-01 MED ORDER — PANTOPRAZOLE SODIUM 40 MG IV SOLR
40.0000 mg | Freq: Two times a day (BID) | INTRAVENOUS | Status: DC
  Administered 2022-10-01 – 2022-10-04 (×6): 40 mg via INTRAVENOUS
  Filled 2022-10-01 (×6): qty 10

## 2022-10-01 MED ORDER — NOREPINEPHRINE 16 MG/250ML-% IV SOLN
0.0000 ug/min | INTRAVENOUS | Status: DC
  Administered 2022-10-01: 10 ug/min via INTRAVENOUS
  Administered 2022-10-02: 18 ug/min via INTRAVENOUS
  Administered 2022-10-02: 14 ug/min via INTRAVENOUS
  Administered 2022-10-02: 19.84 ug/min via INTRAVENOUS
  Administered 2022-10-04: 7 ug/min via INTRAVENOUS
  Filled 2022-10-01 (×4): qty 250

## 2022-10-01 MED ORDER — NOREPINEPHRINE 4 MG/250ML-% IV SOLN
0.0000 ug/min | INTRAVENOUS | Status: DC
  Administered 2022-10-01 (×2): 20 ug/min via INTRAVENOUS
  Administered 2022-10-01: 5 ug/min via INTRAVENOUS
  Administered 2022-10-01: 18 ug/min via INTRAVENOUS
  Filled 2022-10-01 (×2): qty 250

## 2022-10-01 MED ORDER — MAGNESIUM SULFATE 2 GM/50ML IV SOLN
2.0000 g | Freq: Once | INTRAVENOUS | Status: DC
  Filled 2022-10-01: qty 50

## 2022-10-01 MED ORDER — SODIUM CHLORIDE 0.9 % IV SOLN
1.5000 g | INTRAVENOUS | Status: AC
  Administered 2022-10-01: 1.5 g via INTRAVENOUS
  Filled 2022-10-01: qty 4

## 2022-10-01 NOTE — Anesthesia Procedure Notes (Signed)
Central Venous Catheter Insertion Performed by: Santa Lighter, MD, anesthesiologist Start/End01/05/2023 3:05 PM, 10/03/2022 3:15 PM Patient location: OOR procedure area. Preanesthetic checklist: patient identified, IV checked, site marked, risks and benefits discussed, surgical consent, monitors and equipment checked, pre-op evaluation, timeout performed and anesthesia consent Position: Trendelenburg Lidocaine 1% used for infiltration Hand hygiene performed , maximum sterile barriers used  and Seldinger technique used Catheter size: 8 Fr Total catheter length 16. Central line was placed.Double lumen Procedure performed using ultrasound guided technique. Ultrasound Notes:anatomy identified, needle tip was noted to be adjacent to the nerve/plexus identified, no ultrasound evidence of intravascular and/or intraneural injection and image(s) printed for medical record Attempts: 1 Following insertion, line sutured, dressing applied and Biopatch. Post procedure assessment: blood return through all ports, free fluid flow and no air  Patient tolerated the procedure well with no immediate complications.

## 2022-10-01 NOTE — Progress Notes (Signed)
  Echocardiogram 2D Echocardiogram has been performed.  [name] [name] 10/10/2022, 5:28 PM

## 2022-10-01 NOTE — Progress Notes (Signed)
Referral MD  Reason for Referral: Ampullary carcinoma with biliary obstruction  Chief Complaint  Patient presents with   Abnormal Labs   HPI: [name] is a very nice 67 year old African-American male.  I must say that he is quite thin.  According to his daughter, who comes with him, was that he was never all that big.  He did drink quite a bit.  He started about a month ago.  He does smoke.  He smokes about half pack per day.  He was hospitalized at [facility] back in December.  He presented with marked hyperbilirubinemia that was painless.  He was having a lot of diarrhea with this.  He had a CT scan that was done which showed intra hepatic extrahepatic biliary obstruction.  There is no obvious mass.  He had no adenopathy.  No fluid.  He was seen by Gastroenterology.  They went ahead and did an ERCP on him.  This was on 09/17/2022.  A severe stricture was noted in the lower third of the common bile duct.  It appeared to be malignant.  Brushings were done.  The pathology report ([id number]) showed malignant cells consistent with adenocarcinoma.  He did have a stent placed.  I think the sphincter was incised.  His tumor markers did show an elevated CEA of 67.  His CA 19-9 was normal at 2.  He subsequently was discharged on 09/19/2022.  He is still having diarrhea.  He is afraid to eat because of diarrhea.  He has not taken anything for the diarrhea.  He probably has about 7-8 episodes of diarrhea a day.  Other problems that he had is that he has congestive heart failure.  Back in May, he had a echocardiogram showed an ejection fraction of 25%.  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  He is followed by cardiology.  Ever since been home, he is really not eaten well.  He is lost about 10 more pounds.  His skin itches a little bit.  He has had no problems urinating.  His urine is still quite dark.  He has had no cough or shortness of breath.  I do not think there is been  any problems with COVID.  Currently, I would have to say that his performance status is probably ECOG 2.    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[place]) 09/47/0962   Chronic systolic CHF (congestive heart failure) ([place]) 01/31/2015   Coronary artery disease    Hypertension    Nonischemic cardiomyopathy ([place]) 06/30/2015  :   Past Surgical History:  Procedure Laterality Date   BILIARY BRUSHING  09/17/2022   Procedure: BILIARY BRUSHING;  Surgeon: Jackquline Denmark, MD;  Location: [facility] ENDOSCOPY;  Service: Gastroenterology;;   BILIARY STENT PLACEMENT  09/17/2022   Procedure: BILIARY STENT PLACEMENT;  Surgeon: Jackquline Denmark, MD;  Location: [place];  Service: Gastroenterology;;   CARDIAC CATHETERIZATION N/A 02/04/2015   Procedure: Right/Left Heart Cath and Coronary Angiography;  Surgeon: Larey Dresser, MD;  Location: [place] CV LAB;  Service: Cardiovascular;  Laterality: N/A;   CARDIAC DEFIBRILLATOR PLACEMENT  07/09/2015   CHALAZION EXCISION Left 01/16/2014   Procedure: MINOR EXCISION OF CHALAZION;  Surgeon: Myrtha Mantis., MD;  Location: [place];  Service: Ophthalmology;  Laterality: Left;   EP IMPLANTABLE DEVICE N/A 07/09/2015   Procedure: ICD Implant;  Surgeon: Deboraha Sprang, MD;  Location: [place] [facility] LAB;  Service:  Cardiovascular;  Laterality: N/A;   ERCP N/A 09/17/2022   Procedure: ENDOSCOPIC RETROGRADE CHOLANGIOPANCREATOGRAPHY (ERCP);  Surgeon: Jackquline Denmark, MD;  Location: [facility] ENDOSCOPY;  Service: Gastroenterology;  Laterality: N/A;   EYE SURGERY Left ~ 2013   "eye infection"   SPHINCTEROTOMY  09/17/2022   Procedure: SPHINCTEROTOMY;  Surgeon: Jackquline Denmark, MD;  Location: [facility] ENDOSCOPY;  Service: Gastroenterology;;  :   Current Facility-Administered Medications:    carvedilol (COREG) tablet 3.125 mg, 3.125 mg, Oral, BID WC, Zada Finders R, MD, 3.125 mg at 09/30/22  1653   digoxin (LANOXIN) tablet 0.125 mg, 0.125 mg, Oral, Daily, Patel, Vishal R, MD   HYDROmorphone (DILAUDID) injection 1 mg, 1 mg, Intravenous, Q2H PRN, Paulyne Mooty, Rudell Cobb, MD   ondansetron (ZOFRAN) tablet 4 mg, 4 mg, Oral, Q6H PRN **OR** ondansetron (ZOFRAN) injection 4 mg, 4 mg, Intravenous, Q6H PRN, Zada Finders R, MD, 4 mg at 10/08/2022 0227   pantoprazole (PROTONIX) EC tablet 40 mg, 40 mg, Oral, Daily, Zada Finders R, MD, 40 mg at 09/30/22 1138   sodium chloride flush (NS) 0.9 % injection 3 mL, 3 mL, Intravenous, Q12H, Zada Finders R, MD, 3 mL at 09/30/22 2206:  :  No Known Allergies:   Family History  Problem Relation Age of Onset   Stroke Mother    Heart Problems Other   :   Social History   Socioeconomic History   Marital status: Single    Spouse name: Not on file   Number of children: Not on file   Years of education: Not on file   Highest education level: Not on file  Occupational History   Not on file  Tobacco Use   Smoking status: Every Day    Packs/day: 1.00    Years: 40.00    Total pack years: 40.00    Types: Cigarettes   Smokeless tobacco: Never  Vaping Use   Vaping Use: Every day  Substance and Sexual Activity   Alcohol use: Not Currently    Comment: 07/09/2015 "I haven't had a drink since last of 12/2014"   Drug use: Not Currently    Types: "Crack" cocaine, Marijuana    Comment: 07/09/2015 "I did drugs a long time;ago  quit in the mid 1980's"   Sexual activity: Not Currently  Other Topics Concern   Not on file  Social History Narrative   Not on file   Social Determinants of Health   Financial Resource Strain: Not on file  Food Insecurity: Food Insecurity Present (09/30/2022)   Hunger Vital Sign    Worried About Estate manager/land agent of Food in the Last Year: Never true    Ran Out of Food in the Last Year: Sometimes true  Transportation Needs: No Transportation Needs (09/30/2022)   PRAPARE - Hydrologist (Medical): No    Lack  of Transportation (Non-Medical): No  Physical Activity: Not on file  Stress: Not on file  Social Connections: Not on file  Intimate Partner Violence: Not At Risk (09/30/2022)   Humiliation, Afraid, Rape, and Kick questionnaire    Fear of Current or Ex-Partner: No    Emotionally Abused: No    Physically Abused: No    Sexually Abused: No  :  Review of Systems  Constitutional:  Positive for malaise/fatigue and weight loss.  HENT: Negative.    Eyes: Negative.   Respiratory: Negative.    Cardiovascular:  Positive for palpitations.  Gastrointestinal: Negative.   Genitourinary: Negative.   Musculoskeletal: Negative.   Skin:  Positive for itching.  Neurological: Negative.   Endo/Heme/Allergies: Negative.   Psychiatric/Behavioral: Negative.      Exam: His vital signs are temperature of 98.  Pulse 94.  Blood pressure 106/75.  Weight is 128 pounds.  '@IPVITALS'$ @ Physical Exam Vitals reviewed.  Constitutional:      Comments: This is a thin African-American male.  He is somewhat cachectic.  He is in no obvious distress.  HENT:     Head: Normocephalic and atraumatic.  Eyes:     Pupils: Pupils are equal, round, and reactive to light.     Comments: Ocular exam does show scleral icterus.  He does have extraocular muscle movement that is normal.  His pupils react appropriately.  Cardiovascular:     Rate and Rhythm: Normal rate and regular rhythm.     Heart sounds: Normal heart sounds.     Comments: Cardiac exam is regular rate and rhythm.  He has an occasional extra beat.  He has 1/6 systolic ejection murmur. Pulmonary:     Effort: Pulmonary effort is normal.     Breath sounds: Normal breath sounds.  Abdominal:     General: Bowel sounds are normal.     Palpations: Abdomen is soft.     Comments: Abdomen is soft.  He has decent bowel sounds.  There is no fluid wave.  There is no guarding or rebound tenderness.  I cannot palpate any abdominal mass.  There is no palpable liver or spleen tip.   Musculoskeletal:        General: No tenderness or deformity. Normal range of motion.     Cervical back: Normal range of motion.     Comments: Extremity shows symmetric muscle atrophy bilaterally.  He has decent range of motion of his joints.  He has decent strength bilaterally.  Lymphadenopathy:     Cervical: No cervical adenopathy.  Skin:    General: Skin is warm and dry.     Findings: No erythema or rash.  Neurological:     Mental Status: He is alert and oriented to person, place, and time.  Psychiatric:        Behavior: Behavior normal.        Thought Content: Thought content normal.        Judgment: Judgment normal.     Recent Labs    09/30/22 0405 10/12/2022 0529  WBC 13.1* 38.7*  HGB 9.8* 11.2*  HCT 27.8* 32.7*  PLT 205 196     Recent Labs    10/05/2022 1107 09/30/22 0405  NA 139 135  K 2.9* 4.1  CL 103 107  CO2 25 21*  GLUCOSE 124* 86  BUN 13 11  CREATININE 1.07 0.84  CALCIUM 8.8* 8.0*     Blood smear review: None  Pathology: See above    Assessment and Plan: Mr. Justiniano is a very nice 67 year old African-American male.  He has marked hyperbilirubinemia.  His bilirubin is still quite elevated.  He is having a lot of diarrhea.  I think that he is going had to be admitted again.  He just looks quite frail.  Again the bilirubin is 14.4.  He has marked hypokalemia.  Ideally, this malignancy will be dealt with surgery.  I doubt that surgery would even consider operating on him because of his congestive heart failure with a ejection fraction of 25%.  He clearly has localized disease.  The scan does not show anything that looks like it is metastatic.  He had a normal CA 19-9.  The CEA was on the higher side.  I think he is going need to have another ERCP done.  Maybe, needs to have a metal stent placed to try to help open the biliary duct.  I think that he is probably going need radiation therapy for the malignancy.  Whether or not he can take any  chemotherapy is a whole another set of problems.  He does not have a great performance status.  As such, I am unsure how aggressive we can be was using chemotherapy with radiation.  He did findings have another echocardiogram done while in the hospital.  He does see Cardiology.  I am glad that he actually came in to see [place].  He clearly needs to be back in the hospital.  Maybe, the stricture can be opened up.   ADDENDUM: Mr. Pennella was admitted.  He is going to have another ERCP.  Hopefully, the bilirubin will be able to come down in the bile duct will be able to be opened up.  I really think the problem that we have with him is how to be able to treat this.  Again, looks that this is a localized malignancy, at least on the scans.  However, his CEA is quite elevated.  The CEA could be elevated because of the biliary obstruction.  Will be interesting to see what the CEA level is once the bile duct opens up.  He does need to have an echocardiogram to see what his cardiac function is like.  His prealbumin is only 6.  This is an incredibly ominous prognostic factor. Why this will be increases if his nutritional state can be increased.  He probably needs to have nutrition see him to try to help optimize his nutritional intake.  I really do not see a role for surgery for him.  I would think the only option for therapy for him is going to be radiation to the biliary tract.  Even with that, I am unsure how well he would tolerate it.  He is having quite a bit of abdominal pain.  Hopefully, this will also improve if the biliary tract can be opened up.    His bilirubin is 9.8.  His albumin is only 1.9.  This is incredibly low.  Again, I think his prognosis will clearly be dictated by his nutritional state and prealbumin.  I have to believe that he does have metastatic disease just  given his appearance.  I suppose some of this might be from congestive heart failure.  I would have to suspect that no  matter what we do, it probably will be palliative.  I do appreciate the incredible care that he is going to get from everybody on 5 E.  [name], MD  Psalms [phone]

## 2022-10-01 NOTE — Anesthesia Procedure Notes (Addendum)
Procedure Name: Intubation Date/Time: 10/17/2022 1:43 PM  Performed by: Lavina Hamman, CRNAPre-anesthesia Checklist: Patient identified, Emergency Drugs available, Suction available, Patient being monitored and Timeout performed Patient Re-evaluated:Patient Re-evaluated prior to induction Oxygen Delivery Method: Circle system utilized Preoxygenation: Pre-oxygenation with 100% oxygen Induction Type: IV induction Ventilation: Mask ventilation without difficulty Laryngoscope Size: Mac and 4 Grade View: Grade I Tube type: Oral Tube size: 7.5 mm Number of attempts: 1 Airway Equipment and Method: Stylet Placement Confirmation: ETT inserted through vocal cords under direct vision, positive ETCO2, CO2 detector and breath sounds checked- equal and bilateral Secured at: 22 cm Tube secured with: Tape Dental Injury: Teeth and Oropharynx as per pre-operative assessment

## 2022-10-01 NOTE — Anesthesia Postprocedure Evaluation (Signed)
Anesthesia Post Note  Patient: Taevon Aschoff  Procedure(s) Performed: ENDOSCOPIC RETROGRADE CHOLANGIOPANCREATOGRAPHY (ERCP) ESOPHAGEAL BRUSHING STENT REMOVAL BILIARY STENT PLACEMENT REMOVAL OF STONES     Patient location during evaluation: SICU Anesthesia Type: General Level of consciousness: sedated Pain management: pain level controlled Vital Signs Assessment: post-procedure vital signs reviewed and stable Respiratory status: patient remains intubated per anesthesia plan Cardiovascular status: unstable Postop Assessment: no apparent nausea or vomiting Anesthetic complications: no Comments: Patient with profound hypotension following stent placement, drainage of pus. Patient required multiple vasopressor infusions. Decision made to leave patient intubated, sedated. CVL, arterial line placed by MDA prior to leaving Endoscopy suite and transporting to ICU.  ICU physician at bedside in Endoscopy suite for report. Report also given to bedside nurse on arrival to ICU.   No notable events documented.  Last Vitals:  Vitals:   10/13/2022 0440 09/28/2022 1255  BP: 112/81 94/62  Pulse: (!) 102 87  Resp: 18 (!) 22  Temp: (!) 36.3 C 36.6 C  SpO2: 100%     Last Pain:  Vitals:   09/27/2022 1255  TempSrc: Temporal  PainSc: 0-No pain                 Santa Lighter

## 2022-10-01 NOTE — Interval H&P Note (Signed)
History and Physical Interval Note:  10/14/2022 1:24 PM  [name] [name]  has presented today for surgery, with the diagnosis of malignant biliary obstruction; stent dysfunction.  The various methods of treatment have been discussed with the patient and family. After consideration of risks, benefits and other options for treatment, the patient has consented to  Procedure(s): ENDOSCOPIC RETROGRADE CHOLANGIOPANCREATOGRAPHY (ERCP) (N/A) as a surgical intervention.  The patient's history has been reviewed, patient examined, no change in status, stable for surgery.  I have reviewed the patient's chart and labs.  Questions were answered to the patient's satisfaction.     The risks of an ERCP were discussed at length, including but not limited to the risk of perforation, bleeding, abdominal pain, post-ERCP pancreatitis (while usually mild can be severe and even life threatening).    [employer]

## 2022-10-01 NOTE — Progress Notes (Signed)
Pharmacy Antibiotic Note  [name] [name] is a 67 y.o. male admitted on 09/30/2022 with persistent hyperbilirubinemia and concern for recurrent biliary duct stricture. 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 Pt underwent repeat ERCP with stent exchange on 1/5 with findings of purulent discharge cholangitis.   Pt requiring multiple pressors for septic shock. Broad spectrum antibiotics initiated, pharmacy to dose cefepime and vancomycin.   Today, 10/25/2022 WBC significantly elevated and increased; 14.3 > 38.7 SCr 1.15, CrCl ~55 mL/min Lactate 6.6 TBW < IBW. Use TBW for dose calculations  Plan: Cefepime 2 g IV q12h + metronidazole 500 mg IV q12h Vancomycin 1500 mg loading dose followed by 1000 mg IV q24h for estimated AUC of 442 Goal vancomycin AUC 400-550. Check levels at steady state as needed Follow renal function, culture data  Height: '6\' 1"'$  (185.4 cm) Weight: 62.6 kg (137 lb 14.4 oz) IBW/kg (Calculated) : 79.9  Temp (24hrs), Avg:97.7 F (36.5 C), Min:96.8 F (36 C), Max:99.7 F (37.6 C)  Recent Labs  Lab 10/13/2022 1107 09/30/22 0405 10/06/2022 0529 09/28/2022 1605  WBC 14.3* 13.1* 38.7*  --   CREATININE 1.07 0.84 1.02 1.15  LATICACIDVEN  --   --   --  6.6*    Estimated Creatinine Clearance: 55.2 mL/min (by C-G formula based on SCr of 1.15 mg/dL).    No Known Allergies  Antimicrobials this admission: Ampicillin/sulbactam x1 1/5 Piperacillin/tazobactam x1 1/5 Vancomycin 1/5 >> Cefepime + metronidazole 1/5 >>  Lenis Noon, PharmD 10/11/2022 5:30 PM

## 2022-10-01 NOTE — Progress Notes (Signed)
No charge note,  patient developed  shock required pressure post procedure, per GI pus was draining out during biliary stent exchange, he is started on abx and being transferred to icu. Hopefully he will have relative fast improvement as source of infection addressed ( pus drained out during stent exchange). Appreciate GI and ICU input.

## 2022-10-01 NOTE — Progress Notes (Signed)
Initial Nutrition Assessment  DOCUMENTATION CODES:   Underweight  INTERVENTION:   Once diet advanced: -Ensure Plus High Protein po TID, each supplement provides 350 kcal and 20 grams of protein.   -Multivitamin with minerals daily  NUTRITION DIAGNOSIS:   Increased nutrient needs related to cancer and cancer related treatments as evidenced by estimated needs.  GOAL:   Patient will meet greater than or equal to 90% of their needs  MONITOR:   PO intake, Supplement acceptance, Labs, Weight trends, I & O's, Skin  REASON FOR ASSESSMENT:   Consult Assessment of nutrition requirement/status  ASSESSMENT:   67 y.o. 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  Patient in endoscopy for ERCP. Pt NPO for procedure. Scheduled for colonoscopy 1/6.  Per chart review, pt has had poor PO intakes since discharge from hospital on 12/24. Pt was having 2 weeks of diarrhea at that time and poor appetite then.  Would benefit from nutritional supplementation once diet is advanced. Suspect some degree of malnutrition.  Per weight records, pt has lost 18 lbs since 5/23 (12% wt loss x 7.5 months, borderline insignificant for time frame).  Medications: KLOR-CON, IV Mg sulfate, KCl, IV Zofran  Labs reviewed: Low K Low Mg  Low iron  NUTRITION - FOCUSED PHYSICAL EXAM:  Unable to complete, pt in endoscopy.  Diet Order:   Diet Order             Diet NPO time specified  Diet effective now                   EDUCATION NEEDS:   Not appropriate for education at this time  Skin:  Skin Assessment: Reviewed RN Assessment  Last BM:  1/3  Height:   Ht Readings from Last 1 Encounters:  09/30/22 '6\' 1"'$  (1.854 m)    Weight:   Wt Readings  from Last 1 Encounters:  10/07/2022 62.6 kg    BMI:  Body mass index is 18.19 kg/m.  Estimated Nutritional Needs:   Kcal:  1900-2100  Protein:  100-115g  Fluid:  2L/day  Clayton Bibles, MS, RD, LDN Inpatient Clinical Dietitian Contact information available via Amion

## 2022-10-01 NOTE — Care Management Important Message (Signed)
Important Message  Patient Details IM Letter given. Name: Caley Volkert MRN: [id number] Date of Birth: 01-Sep-1956   Medicare Important Message Given:  Yes     Kerin Salen 10/05/2022, 10:40 AM

## 2022-10-01 NOTE — IPAL (Signed)
  Interdisciplinary Goals of Care Family Meeting   Date carried out:: 09/28/2022  Location of the meeting: Conference room  Member's involved: Physician, Bedside Registered Nurse, and Family Member or next of kin  Durable Power of Attorney or acting medical decision maker:  [name] [name] and Transport planner  Discussion: We discussed goals of care for [name] [name] .  Discussed his profound shock on 4 pressors.  Epinephrine, norepinephrine, phenylephrine and vasopressin, elevated lactic acid, multiorgan failure with a EF of 8%  Prognosis is very poor and I am not sure he will make it through this hospitalization.  They have agreed to DNR status and are talking to the rest of the family and we will see how he does over the next 24 hours.  Code status: Full DNR  Disposition: Continue current acute care   Time spent for the meeting: 20 mins  Chara Marquard 09/28/2022, 5:20 PM

## 2022-10-01 NOTE — Progress Notes (Signed)
Pt presented to endoscopy today for ERCP with stent exchange with MD Mansouraty. Post procedure, patient became hypotensive. Pt transferred to ICU. See physician and anesthesia notes.  Debarah Crape, RN 10/03/2022 4:06 PM

## 2022-10-01 NOTE — Anesthesia Preprocedure Evaluation (Addendum)
Anesthesia Evaluation  Patient identified by MRN, date of birth, ID band Patient awake    Reviewed: Allergy & Precautions, NPO status , Patient's Chart, lab work & pertinent test results, reviewed documented beta blocker date and time   Airway Mallampati: II  TM Distance: >3 FB Neck ROM: Full    Dental  (+) Dental Advisory Given, Poor Dentition, Missing   Pulmonary Current Smoker and Patient abstained from smoking.   Pulmonary exam normal breath sounds clear to auscultation       Cardiovascular hypertension, Pt. on home beta blockers and Pt. on medications + CAD, + Peripheral Vascular Disease and +CHF  Normal cardiovascular exam+ Cardiac Defibrillator  Rhythm:Regular Rate:Normal     Neuro/Psych negative neurological ROS  negative psych ROS   GI/Hepatic ,GERD  Medicated,,Ampullary carcinoma malignant biliary obstruction; stent dysfunction   Endo/Other  negative endocrine ROS    Renal/GU Renal disease     Musculoskeletal negative musculoskeletal ROS (+)    Abdominal   Peds  Hematology  (+) Blood dyscrasia, anemia   Anesthesia Other Findings   Reproductive/Obstetrics negative OB ROS                             Anesthesia Physical Anesthesia Plan  ASA: 4  Anesthesia Plan: General   Post-op Pain Management:    Induction: Intravenous  PONV Risk Score and Plan: 1 and Dexamethasone and Ondansetron  Airway Management Planned: Oral ETT  Additional Equipment:   Intra-op Plan:   Post-operative Plan: Extubation in OR  Informed Consent: I have reviewed the patients History and Physical, chart, labs and discussed the procedure including the risks, benefits and alternatives for the proposed anesthesia with the patient or authorized representative who has indicated his/her understanding and acceptance.     Dental advisory given  Plan Discussed with: CRNA  Anesthesia Plan Comments:  (Etomidate )        Anesthesia Quick Evaluation

## 2022-10-01 NOTE — Transfer of Care (Signed)
Immediate Anesthesia Transfer of Care Note  Patient: [name] [name]  Procedure(s) Performed: ENDOSCOPIC RETROGRADE CHOLANGIOPANCREATOGRAPHY (ERCP) ESOPHAGEAL BRUSHING STENT REMOVAL BILIARY STENT PLACEMENT REMOVAL OF STONES  Patient Location: ICU  Anesthesia Type:General  Level of Consciousness: awake, alert , sedated, patient cooperative, and Patient remains intubated per anesthesia plan  Airway & Oxygen Therapy: Patient remains intubated per anesthesia plan and Patient placed on Ventilator (see vital sign flow sheet for setting)  Post-op Assessment: Report given to RN, Post -op Vital signs reviewed and unstable, Anesthesiologist notified, and Patient moving all extremities  Post vital signs: Reviewed  Last Vitals:  Vitals Value Taken Time  BP 62/44 10/14/2022 1545  Temp    Pulse    Resp 20 10/17/2022 1555  SpO2    Vitals shown include unvalidated device data.  Last Pain:  Vitals:   10/21/2022 1255  TempSrc: Temporal  PainSc: 0-No pain      Patients Stated Pain Goal: 3 (94/85/46 2703)  Complications: No notable events documented.

## 2022-10-01 NOTE — Progress Notes (Signed)
[facility] Progress Note Patient Name: [name] [name] DOB: Jun 08, 1956 MRN: [id number]   Date of Service  10/19/2022  HPI/Events of Note  Multiple issues: 1. Hypoglycemia - Blood glucose = 54. Patient already given D50. 2. Abdominal film reveals distended small bowil loops.   eICU Interventions  Plan: D10W IV infusion at 50 mL/hour. POC CBG now and Q 4 hours.  OGT to LIS.      Intervention Category Major Interventions: Other:  Lysle Dingwall 10/21/2022, 9:17 PM

## 2022-10-01 NOTE — Anesthesia Procedure Notes (Signed)
Arterial Line Insertion Start/End01/15/2024 3:15 PM, 10/14/2022 3:22 PM Performed by: Santa Lighter, MD, anesthesiologist  Patient location: OOR procedure area. Preanesthetic checklist: patient identified, IV checked, site marked, risks and benefits discussed, surgical consent, monitors and equipment checked, pre-op evaluation, timeout performed and anesthesia consent Lidocaine 1% used for infiltration Left, radial was placed Catheter size: 20 G Hand hygiene performed  and maximum sterile barriers used   Attempts: 1 Procedure performed using ultrasound guided technique. Ultrasound Notes:anatomy identified, needle tip was noted to be adjacent to the nerve/plexus identified, no ultrasound evidence of intravascular and/or intraneural injection and image(s) printed for medical record Following insertion, dressing applied and Biopatch. Post procedure assessment: normal and unchanged  Patient tolerated the procedure well with no immediate complications.

## 2022-10-01 NOTE — Consult Note (Addendum)
NAME:  [name] [name], MRN:  [id number], DOB:  11-22-1955, LOS: 2 ADMISSION DATE:  10/14/2022, CONSULTATION DATE: 10/18/2022 REFERRING MD: Dr. Rush Landmark, CHIEF COMPLAINT: Septic shock  History of Present Illness:   67 year old with a history of nonischemic cardiomyopathy [EF 25%], hypertension, diagnosis of cholangiocarcinoma malignant biliary stricture s/p stenting on 12/22 treated with stent obstruction.  WBC count elevated to 38.7 on 1/5.  He was given a dose of Unasyn.  He underwent repeat ERCP and stent replacement with findings of purulent discharge, cholangitis.  He is septic post procedure with shock and PCCM called for admission to ICU.  Pertinent  Medical History    has a past medical history of AICD (automatic cardioverter/defibrillator) present, 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[place]) (67/61/9509), Chronic systolic CHF (congestive heart failure) ([place]) (01/31/2015), Coronary artery disease, Hypertension, and Nonischemic cardiomyopathy ([place]) (06/30/2015).   Significant Hospital Events: Including procedures, antibiotic start and stop dates in addition to other pertinent events   1/3 Admit 1/5 ERCP, transfer to ICU with septic shock  Interim History / Subjective:     Objective   Blood pressure 94/62, pulse 87, temperature 97.8 F (36.6 C), temperature source Temporal, resp. rate (!) 22, height '6\' 1"'$  (1.854 m), weight 62.6 kg, SpO2 100 %.        Intake/Output Summary (Last 24 hours) at 10/08/2022 1456 Last data filed at 10/07/2022 1345 Gross per 24 hour  Intake 100 ml  Output --  Net 100 ml   Filed Weights   09/30/22 1818 10/08/2022 0440  Weight: 58.2 kg 62.6 kg    Examination: Gen:      No acute distress, cachectic, malnourished, chronically ill-appearing HEENT:  EOMI, sclera anicteric Neck:     No masses; no thyromegaly Lungs:    Clear to auscultation bilaterally; normal respiratory effort CV:         Regular rate and rhythm; no murmurs Abd:      + bowel sounds; soft, non-tender; no  palpable masses, no distension Ext:    No edema; adequate peripheral perfusion Skin:      Warm and dry; no rash Neuro: Sedated, unresponsive  Labs/imaging reviewed Significant for potassium 3.2, BUN/creatinine 10/1.02 AST 162, ALT 60, alk phos 889 WBC count 38.7, hemoglobin 11.2, platelets 196  Resolved Hospital Problem list     Assessment & Plan:  Septic shock secondary to cholangitis Biliary obstruction secondary to cholangiocarcinoma Post ERCP with stent replacement He is hypotensive postprocedure.  Admit to ICU.  Start Levophed IV fluid resuscitation.  Give additional 2 L of LR Continue Vanco, Zosyn.  Follow cultures Lactic acid, troponin  Chronic heart failure, nonischemic cardiomyopathy.   Last EF 25% Hold outpatient hypertension medications Telemetry monitoring. Check echocardiogram, follow troponins.  CKD Monitor urine output and creatinine  Failure to thrive, severe malnutrition present on admission Will get patient consulted tube feeds once he is stabilized.  Best Practice (right click and "Reselect all SmartList Selections" daily)   Diet/type: NPO DVT prophylaxis: prophylactic heparin  GI prophylaxis: PPI Lines: Central line Foley:  N/A Code Status:  full code Last date of multidisciplinary goals of care discussion '[]'$   Critical care time:    The patient is critically ill with multiple organ system failure and requires high complexity decision making for assessment and support, frequent evaluation and titration of therapies, advanced monitoring, review of radiographic studies and interpretation of complex data.   Critical Care Time devoted to patient care services, exclusive of separately billable procedures, described in this note is 45 minutes.  Marshell Garfinkel MD [facility] See Amion for pager  If no response to pager , please call [phone] until 7pm After 7:00 pm call Elink  [phone] 10/10/2022, 2:57 PM

## 2022-10-01 NOTE — Progress Notes (Signed)
Patient is for ERCP today. Reviewed labs: WBC 38 K noted, also hypokalemic. I contacted [facility] about stat repletion. Otherwise will not be able to proceed today.

## 2022-10-01 NOTE — Progress Notes (Signed)
Left lower extremity venous duplex has been completed. Preliminary results can be found in CV Proc through chart review.   10/13/2022 8:38 AM Carlos Levering RVT

## 2022-10-01 NOTE — Op Note (Signed)
[facility] Patient Name: [name] [name] Procedure Date: 09/28/2022 MRN: [id number] Attending MD: Justice Britain , MD, [id number] Date of Birth: 20-Jan-1956 CSN: [id number] Age: 67 Admit Type: Inpatient Procedure:                ERCP Indications:              Malignant stricture of the common bile duct Providers:                Justice Britain, MD, Dulcy Fanny, Frazier Richards, Technician Referring MD:             Jackquline Denmark, MD, Rudell Cobb. Ennever MD, MD,                            Inpatient Medical Service Medicines:                General Anesthesia, Unasyn 1.5 g IV, Diclofenac 100                            mg rectal Complications:            No immediate complications. Estimated Blood Loss:     Estimated blood loss was minimal. Procedure:                Pre-Anesthesia Assessment:                           - Prior to the procedure, a History and Physical                            was performed, and patient medications and                            allergies were reviewed. The patient's tolerance of                            previous anesthesia was also reviewed. The risks                            and benefits of the procedure and the sedation                            options and risks were discussed with the patient.                            All questions were answered, and informed consent                            was obtained. Prior Anticoagulants: The patient has                            taken no anticoagulant or antiplatelet agents. ASA  Grade Assessment: III - A patient with severe                            systemic disease. After reviewing the risks and                            benefits, the patient was deemed in satisfactory                            condition to undergo the procedure.                           After obtaining informed consent, the scope was                             passed under direct vision. Throughout the                            procedure, the patient's blood pressure, pulse, and                            oxygen saturations were monitored continuously. The                            [id number]-[id number] ([id number] Olympus duodenoscope was                            introduced through the mouth, and used to inject                            contrast into and used to inject contrast into the                            bile duct. The ERCP was accomplished without                            difficulty. The patient tolerated the procedure. Scope In: Scope Out: Findings:      A biliary stent was visible on the scout film.      The upper GI tract was traversed under direct vision without detailed       examination. Diffuse, yellow plaques were found in the entire esophagus.       A 2 cm hiatal hernia was present. No gross lesions were noted in the       entire examined stomach. No gross lesions were noted in the duodenal       bulb, in the first portion of the duodenum and in the second portion of       the duodenum. A biliary sphincterotomy had been performed. The       sphincterotomy appeared open. The major papilla was edematous. Pus was       emerging from the major papilla. One plastic biliary stent originating       in the biliary tree was emerging from the major papilla. The stent was       partially occluded. This stent was removed from the  biliary tree using a       Raptor grasping device with more pus being .      A short 0.035 inch Soft Jagwire was passed into the biliary tree. The       Hydratome sphincterotome was passed over the guidewire and the bile duct       was then deeply cannulated. Contrast was injected. I personally       interpreted the bile duct images. Ductal flow of contrast was adequate.       Image quality was adequate. Contrast extended to the hepatic ducts.       Opacification of the entire biliary tree except for the  gallbladder was       successful. The lower third of the main bile duct contained a single       moderate stenosis 35 mm in length. The middle third of the main bile       duct, upper third of the main bile duct and left and right hepatic ducts       and all intrahepatic branches were moderately dilated, secondary to       aforementioned stricture. The largest diameter was 18 mm. The biliary       tree was swept with a retrieval balloon. Sigificant pus was swept from       the duct. Sludge was swept from the duct. One 10 mm by 6 cm [place]       uncovered metal biliary stent was placed into the common bile duct. The       stent was in good position. Suction via endoscope led to adequate       drainage after stent placement      A pancreatogram was not performed.      The duodenoscope was withdrawn from the patient. Impression:               - Esophageal plaques were found, suspicious for                            candidiasis - cytology brushings performed.                           - 2 cm hiatal hernia.                           - No other gross lesions in the entire stomach.                           - No gross lesions in the duodenal bulb, in the                            first portion of the duodenum and in the second                            portion of the duodenum.                           - Prior biliary sphincterotomy appeared open. The                            major papilla appeared edematous.  Pus was seen in                            the major papilla.                           - One partially occluded stent from the biliary                            tree was seen in the major papilla. This was                            removed.                           - A single moderate biliary stricture was found in                            the lower third of the main bile duct. The                            stricture was malignant appearing.                           - The  upper third of the main bile duct, middle                            third of the main bile duct and left and right                            hepatic ducts and all intrahepatic branches were                            moderately dilated, secondary to aforementioned                            stricture.                           - One uncovered metal biliary stent was placed into                            the common bile duct. Adequate drainage of pus and                            cholangitis was noted today. Moderate Sedation:      Not Applicable - Patient had care per Anesthesia. Recommendation:           - The patient will be observed post-procedure,                            until all discharge criteria are met.                           - Patient has had  significant hypotension develop                            post-procedure while in attempt of extubation. Have                            been in discussion with Anesthesia and with ICU                            Team and with Primary Team. He will need a Central                            line (have gottent verbal consent from the                            daughter) he will need ICU level of care.                           - NPO.                           - Check liver enzymes (AST, ALT, alkaline                            phosphatase, bilirubin) in the morning.                           - Observe patient's clinical course.                           - Follow up cytology.                           - Start Zosyn 3.375 g Q8H.                           - Start Fluconazole 200 mg on Day 1 and then 100 mg                            Days 2-14 for severe esophageal candidiasis (when                            able).                           - Recommend antibiotics empiric broad spectrum                            antibiotics (we have ordered Zosyn but as noted                            above Unasyn was given).                            - Colonoscopy on hold currently.                           -  Watch for pancreatitis, bleeding, perforation,                            and cholangitis.                           - The findings and recommendations were discussed                            with the patient.                           - The findings and recommendations were discussed                            with the patient's family.                           - The findings and recommendations were discussed                            with the referring physician. Procedure Code(s):        --- Professional ---                           [phone], Endoscopic retrograde                            cholangiopancreatography (ERCP); with removal and                            exchange of stent(s), biliary or pancreatic duct,                            including pre- and post-dilation and guide wire                            passage, when performed, including sphincterotomy,                            when performed, each stent exchanged                           [id number], Endoscopic retrograde                            cholangiopancreatography (ERCP); with removal of                            calculi/debris from biliary/pancreatic duct(s) Diagnosis Code(s):        --- Professional ---                           K22.9, Disease of esophagus, unspecified                           K44.9, Diaphragmatic hernia without obstruction or  gangrene                           K83.8, Other specified diseases of biliary tract                           T85.590A, Other mechanical complication of bile                            duct prosthesis, initial encounter                           K83.1, Obstruction of bile duct                           Z46.59, Encounter for fitting and adjustment of                            other gastrointestinal appliance and device CPT copyright 2022 [employer]. All rights  reserved. The codes documented in this report are preliminary and upon coder review may  be revised to meet current compliance requirements. Justice Britain, MD 10/13/2022 3:06:17 PM Number of Addenda: 0

## 2022-10-02 ENCOUNTER — Encounter (HOSPITAL_COMMUNITY): Admission: EM | Disposition: E | Payer: Self-pay | Source: Home / Self Care | Attending: Internal Medicine

## 2022-10-02 ENCOUNTER — Encounter (HOSPITAL_COMMUNITY): Payer: Self-pay | Admitting: Certified Registered Nurse Anesthetist

## 2022-10-02 ENCOUNTER — Inpatient Hospital Stay (HOSPITAL_COMMUNITY): Payer: Medicare Other

## 2022-10-02 ENCOUNTER — Encounter: Payer: Self-pay | Admitting: Family Medicine

## 2022-10-02 ENCOUNTER — Encounter: Payer: Self-pay | Admitting: *Deleted

## 2022-10-02 DIAGNOSIS — C241 Malignant neoplasm of ampulla of Vater: Secondary | ICD-10-CM | POA: Diagnosis not present

## 2022-10-02 DIAGNOSIS — R970 Elevated carcinoembryonic antigen [CEA]: Secondary | ICD-10-CM | POA: Diagnosis not present

## 2022-10-02 DIAGNOSIS — R6521 Severe sepsis with septic shock: Secondary | ICD-10-CM

## 2022-10-02 DIAGNOSIS — C801 Malignant (primary) neoplasm, unspecified: Secondary | ICD-10-CM

## 2022-10-02 DIAGNOSIS — A419 Sepsis, unspecified organism: Secondary | ICD-10-CM

## 2022-10-02 DIAGNOSIS — C7889 Secondary malignant neoplasm of other digestive organs: Secondary | ICD-10-CM | POA: Diagnosis not present

## 2022-10-02 DIAGNOSIS — R627 Adult failure to thrive: Secondary | ICD-10-CM | POA: Diagnosis not present

## 2022-10-02 DIAGNOSIS — E43 Unspecified severe protein-calorie malnutrition: Secondary | ICD-10-CM

## 2022-10-02 DIAGNOSIS — I5020 Unspecified systolic (congestive) heart failure: Secondary | ICD-10-CM

## 2022-10-02 DIAGNOSIS — K831 Obstruction of bile duct: Secondary | ICD-10-CM

## 2022-10-02 DIAGNOSIS — E876 Hypokalemia: Secondary | ICD-10-CM | POA: Diagnosis not present

## 2022-10-02 DIAGNOSIS — Z515 Encounter for palliative care: Secondary | ICD-10-CM

## 2022-10-02 DIAGNOSIS — Z7189 Other specified counseling: Secondary | ICD-10-CM

## 2022-10-02 DIAGNOSIS — R7989 Other specified abnormal findings of blood chemistry: Secondary | ICD-10-CM

## 2022-10-02 DIAGNOSIS — I428 Other cardiomyopathies: Secondary | ICD-10-CM

## 2022-10-02 DIAGNOSIS — R933 Abnormal findings on diagnostic imaging of other parts of digestive tract: Secondary | ICD-10-CM | POA: Diagnosis not present

## 2022-10-02 LAB — COMPREHENSIVE METABOLIC PANEL
ALT: 117 U/L — ABNORMAL HIGH (ref 0–44)
AST: 420 U/L — ABNORMAL HIGH (ref 15–41)
Albumin: 1.9 g/dL — ABNORMAL LOW (ref 3.5–5.0)
Alkaline Phosphatase: 795 U/L — ABNORMAL HIGH (ref 38–126)
Anion gap: 15 (ref 5–15)
BUN: 20 mg/dL (ref 8–23)
CO2: 14 mmol/L — ABNORMAL LOW (ref 22–32)
Calcium: 7.4 mg/dL — ABNORMAL LOW (ref 8.9–10.3)
Chloride: 105 mmol/L (ref 98–111)
Creatinine, Ser: 1.44 mg/dL — ABNORMAL HIGH (ref 0.61–1.24)
GFR, Estimated: 53 mL/min — ABNORMAL LOW (ref >60)
Glucose, Bld: 215 mg/dL — ABNORMAL HIGH (ref 70–99)
Potassium: 4.1 mmol/L (ref 3.5–5.1)
Sodium: 134 mmol/L — ABNORMAL LOW (ref 135–145)
Total Bilirubin: 12.4 mg/dL — ABNORMAL HIGH (ref 0.3–1.2)
Total Protein: 5.1 g/dL — ABNORMAL LOW (ref 6.5–8.1)

## 2022-10-02 LAB — CBC WITH DIFFERENTIAL/PLATELET
Abs Immature Granulocytes: 0.13 10*3/uL — ABNORMAL HIGH (ref 0.00–0.07)
Basophils Absolute: 0.1 10*3/uL (ref 0.0–0.1)
Basophils Relative: 0 %
Eosinophils Absolute: 0.1 10*3/uL (ref 0.0–0.5)
Eosinophils Relative: 0 %
HCT: 28 % — ABNORMAL LOW (ref 39.0–52.0)
Hemoglobin: 9.6 g/dL — ABNORMAL LOW (ref 13.0–17.0)
Immature Granulocytes: 1 %
Lymphocytes Relative: 7 %
Lymphs Abs: 1.8 10*3/uL (ref 0.7–4.0)
MCH: 33.4 pg (ref 26.0–34.0)
MCHC: 34.3 g/dL (ref 30.0–36.0)
MCV: 97.6 fL (ref 80.0–100.0)
Monocytes Absolute: 0.5 10*3/uL (ref 0.1–1.0)
Monocytes Relative: 2 %
Neutro Abs: 22.4 10*3/uL — ABNORMAL HIGH (ref 1.7–7.7)
Neutrophils Relative %: 90 %
Platelets: 145 10*3/uL — ABNORMAL LOW (ref 150–400)
RBC: 2.87 MIL/uL — ABNORMAL LOW (ref 4.22–5.81)
RDW: 15.1 % (ref 11.5–15.5)
WBC: 25 10*3/uL — ABNORMAL HIGH (ref 4.0–10.5)
nRBC: 0.1 % (ref 0.0–0.2)

## 2022-10-02 LAB — GLUCOSE, CAPILLARY
Glucose-Capillary: 161 mg/dL — ABNORMAL HIGH (ref 70–99)
Glucose-Capillary: 167 mg/dL — ABNORMAL HIGH (ref 70–99)
Glucose-Capillary: 184 mg/dL — ABNORMAL HIGH (ref 70–99)
Glucose-Capillary: 192 mg/dL — ABNORMAL HIGH (ref 70–99)
Glucose-Capillary: 202 mg/dL — ABNORMAL HIGH (ref 70–99)
Glucose-Capillary: 210 mg/dL — ABNORMAL HIGH (ref 70–99)
Glucose-Capillary: 219 mg/dL — ABNORMAL HIGH (ref 70–99)
Glucose-Capillary: 39 mg/dL — CL (ref 70–99)
Glucose-Capillary: 42 mg/dL — CL (ref 70–99)

## 2022-10-02 LAB — BLOOD CULTURE ID PANEL (REFLEXED) - BCID2

## 2022-10-02 LAB — MAGNESIUM: Magnesium: 1.9 mg/dL (ref 1.7–2.4)

## 2022-10-02 LAB — LIPASE, BLOOD: Lipase: 34 U/L (ref 11–51)

## 2022-10-02 LAB — DIGOXIN LEVEL: Digoxin Level: <0.2 ng/mL — ABNORMAL LOW (ref 0.8–2.0)

## 2022-10-02 LAB — C DIFFICILE QUICK SCREEN W PCR REFLEX
C Diff antigen: NEGATIVE
C Diff interpretation: NOT DETECTED
C Diff toxin: NEGATIVE

## 2022-10-02 LAB — PHOSPHORUS: Phosphorus: 4.7 mg/dL — ABNORMAL HIGH (ref 2.5–4.6)

## 2022-10-02 LAB — TRIGLYCERIDES: Triglycerides: 199 mg/dL — ABNORMAL HIGH (ref <150)

## 2022-10-02 LAB — C-REACTIVE PROTEIN: CRP: 15.9 mg/dL — ABNORMAL HIGH (ref <1.0)

## 2022-10-02 LAB — LACTIC ACID, PLASMA: Lactic Acid, Venous: 3.5 mmol/L (ref 0.5–1.9)

## 2022-10-02 SURGERY — CANCELLED PROCEDURE

## 2022-10-02 MED ORDER — INSULIN ASPART 100 UNIT/ML IJ SOLN
0.0000 [IU] | INTRAMUSCULAR | Status: DC
  Administered 2022-10-02: 2 [IU] via SUBCUTANEOUS
  Administered 2022-10-02: 3 [IU] via SUBCUTANEOUS
  Administered 2022-10-02 (×2): 2 [IU] via SUBCUTANEOUS
  Administered 2022-10-03 (×3): 1 [IU] via SUBCUTANEOUS
  Administered 2022-10-03 – 2022-10-04 (×4): 2 [IU] via SUBCUTANEOUS
  Administered 2022-10-04 (×3): 1 [IU] via SUBCUTANEOUS
  Administered 2022-10-04: 2 [IU] via SUBCUTANEOUS
  Administered 2022-10-04 – 2022-10-05 (×2): 3 [IU] via SUBCUTANEOUS
  Administered 2022-10-05 (×2): 2 [IU] via SUBCUTANEOUS
  Administered 2022-10-05: 1 [IU] via SUBCUTANEOUS
  Administered 2022-10-05 (×2): 2 [IU] via SUBCUTANEOUS
  Administered 2022-10-06: 3 [IU] via SUBCUTANEOUS
  Administered 2022-10-06 – 2022-10-07 (×3): 2 [IU] via SUBCUTANEOUS
  Administered 2022-10-07 (×2): 3 [IU] via SUBCUTANEOUS
  Administered 2022-10-07: 2 [IU] via SUBCUTANEOUS
  Administered 2022-10-07: 3 [IU] via SUBCUTANEOUS
  Administered 2022-10-07: 2 [IU] via SUBCUTANEOUS
  Administered 2022-10-07 – 2022-10-08 (×2): 1 [IU] via SUBCUTANEOUS
  Administered 2022-10-08: 3 [IU] via SUBCUTANEOUS
  Administered 2022-10-08: 2 [IU] via SUBCUTANEOUS
  Administered 2022-10-08: 1 [IU] via SUBCUTANEOUS
  Administered 2022-10-08: 2 [IU] via SUBCUTANEOUS
  Administered 2022-10-09: 7 [IU] via SUBCUTANEOUS
  Administered 2022-10-09 (×2): 1 [IU] via SUBCUTANEOUS
  Administered 2022-10-09 – 2022-10-10 (×5): 2 [IU] via SUBCUTANEOUS
  Administered 2022-10-10 (×2): 1 [IU] via SUBCUTANEOUS
  Administered 2022-10-10 – 2022-10-11 (×2): 2 [IU] via SUBCUTANEOUS
  Administered 2022-10-11: 1 [IU] via SUBCUTANEOUS
  Administered 2022-10-11: 2 [IU] via SUBCUTANEOUS
  Administered 2022-10-11: 3 [IU] via SUBCUTANEOUS
  Administered 2022-10-11 – 2022-10-12 (×2): 2 [IU] via SUBCUTANEOUS
  Administered 2022-10-12: 1 [IU] via SUBCUTANEOUS

## 2022-10-02 NOTE — Progress Notes (Signed)
Unfortunately, Mr. Oravec is now in the ICU on a ventilator.  This all happened yesterday.  He underwent the ERCP and stent exchange.  The note from the Gastroenterologist noted that there was purulent material coming out of the stent.  Hopefully this was cultured.  He subsequently became hypotensive.  He went to the ICU.  His blood is growing Klebsiella.  I have to believe this is what is coming out of his stent.  He is on pressor support right now.  I really think the huge problem is his incredibly poor cardiac function.  He had an echocardiogram yesterday.  He has a left ventricular ejection fraction less than 20%.  I really think this is going to be a problem that is can be incredibly difficult to overcome.  His labs show white cell count of 25,000.  Hemoglobin 9.6.  Platelet count 145,000.  His BUN is 20 creatinine 1.44.  His calcium 7.4 with an albumin of 1.9.  His bilirubin is 12.4.  The lactic acid is 3.5.  Again, I do not see any evidence of obvious metastatic disease on the scans as had done.  I would have to think that his prognosis is clearly based on his cardiac status and not his underlying malignancy.  I realize that he has never had to have surgery for his cancer.  However, he could have radiation therapy and have a good response.  Will be interesting to see how the CEA level trends with the biliary tract opening up.  His nutritional state is incredibly poor.  He probably needs to have some kind of enteral feeds if possible.  At least, was in the ICU, on a ventilator, he can be fed enterally.  We will just have to see how his bilirubin trends.  We will see what the Klebsiella is sensitive to.  Hopefully, with IV antibiotics, he will be able to come off the ventilator at some point.  Again, I really see the problem is his incredibly poor cardiac function.  I do not know if the needs to be seen by cardiology to see if they can somehow improve his cardiac status.  I know that  he will get incredibly good care from everybody in the ICU.  [name], MD  Oswaldo Milian 41:10

## 2022-10-02 NOTE — Consult Note (Addendum)
NAME:  [name] [name], MRN:  [id number], DOB:  1956/09/18, LOS: 3 ADMISSION DATE:  10/05/2022, CONSULTATION DATE: 10/03/2022 REFERRING MD: Dr. Rush Landmark, CHIEF COMPLAINT: Septic shock  History of Present Illness:   67 year old with a history of nonischemic cardiomyopathy [EF 25%], hypertension, diagnosis of cholangiocarcinoma malignant biliary stricture s/p stenting on 12/22 treated with stent obstruction.  WBC count elevated to 38.7 on 1/5.  He was given a dose of Unasyn.  He underwent repeat ERCP and stent replacement with findings of purulent discharge, cholangitis.  He is septic post procedure with shock and PCCM called for admission to ICU.  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   Significant Hospital Events: Including procedures, antibiotic start and stop dates in addition to other pertinent events   1/3 Admit 1/5 ERCP, transfer to ICU with septic shock  Interim History / Subjective:   Made DNR yesterday after discussion with family.  Echocardiogram shows EF of less than 20% Remains on multiple pressors, intubated and in multiorgan failure.  Objective   Blood pressure 125/75, pulse 90, temperature 99.5 F (37.5 C), resp. rate 19, height '6\' 1"'$  (1.854 m), weight 63.2 kg, SpO2 100 %. CVP:  [9 mmHg-10 mmHg] 9 mmHg  Vent Mode: PRVC FiO2 (%):  [60 %-100 %] 60 % Set Rate:  [16 bmp] 16 bmp Vt Set:  [630 mL] 630 mL PEEP:  [5 cmH20] 5 cmH20 Plateau Pressure:  [13 cmH20-19 cmH20] 13 cmH20   Intake/Output Summary (Last 24 hours) at 10/10/2022 0754 Last data filed at 10/13/2022 7482 Gross per 24 hour  Intake 3468.07 ml  Output 800 ml  Net 2668.07 ml   Filed Weights   09/30/22 1818 10/23/2022 0440 10/25/2022 0500  Weight:  58.2 kg 62.6 kg 63.2 kg    Examination: Gen:      No acute distress, cachectic, malnourished, chronically ill-appearing HEENT:  EOMI, sclera anicteric Neck:     No masses; no thyromegaly ET tube Lungs:    Clear to auscultation bilaterally; normal respiratory effort CV:         Regular rate and rhythm; no murmurs Abd:      + bowel sounds; soft, non-tender; no palpable masses, no distension Ext:    No edema; adequate peripheral perfusion Skin:      Warm and dry; no rash Neuro: Sedated, unresponsive  Labs/imaging reviewed Sodium 134, glucose 215, BUN/creatinine 20/1.44 Alk phos 795, AST 420, ALT 117, total bilirubin 12.4 WBC 25, hemoglobin 9.6, platelets 145  Resolved Hospital Problem list     Assessment & Plan:  Septic shock secondary to cholangitis Biliary obstruction secondary to cholangiocarcinoma Capsular bacteremia Post ERCP with stent replacement Continue gentle IV fluid Antibiotic coverage with cefepime, Flagyl.  Can DC vancomycin. Follow lactic acid  Chronic heart failure, nonischemic cardiomyopathy.   EF less than 20%.  Reduction in cardiac function likely due to septic cardiomyopathy Venous O2 sat in 68 Hold outpatient hypertension medications Telemetry monitoring.  CKD Monitor urine output and creatinine  Failure to thrive, severe malnutrition present on admission Holding tube feeds due to ileus on abdominal x-ray.  NG tube to suction Will start tube feeds as soon as we are able to Dextrose drip for hypoglycemia  Best Practice (right click and "Reselect all SmartList Selections" daily)   Diet/type: NPO DVT prophylaxis: prophylactic heparin  GI  prophylaxis: PPI Lines: Central line and Arterial Line Foley:  Yes, and it is still needed Code Status:  DNR Last date of multidisciplinary goals of care discussion '[]'$  Family discussion on 1/4  Critical care time:    The patient is critically ill with multiple organ system failure and requires high complexity  decision making for assessment and support, frequent evaluation and titration of therapies, advanced monitoring, review of radiographic studies and interpretation of complex data.   Critical Care Time devoted to patient care services, exclusive of separately billable procedures, described in this note is 45 minutes.   Marshell Garfinkel MD [facility] See Amion for pager  If no response to pager , please call [phone] until 7pm After 7:00 pm call Elink  [phone] 10/03/2022, 7:54 AM

## 2022-10-02 NOTE — Progress Notes (Signed)
[facility] Progress Note Patient Name: [name] [name] DOB: Apr 22, 1956 MRN: [id number]   Date of Service  10/03/2022  HPI/Events of Note  Received request for KUB for OGT placement  eICU Interventions  KUB ordered     Intervention Category Intermediate Interventions: Other:  Judd Lien 09/27/2022, 9:30 PM

## 2022-10-02 NOTE — Progress Notes (Signed)
[facility] Progress Note Patient Name: [name] [name] DOB: Aug 27, 1956 MRN: [id number]   Date of Service  10/07/2022  HPI/Events of Note  Hyperglycemia - Blood glucose = 210. Currently on D10W IV infusion at 50 mL/hour.  eICU Interventions  Plan: Decrease D10W IV infusion to 25 mL/hour.      Intervention Category Major Interventions: Hyperglycemia - active titration of insulin therapy  Lysle Dingwall 10/08/2022, 4:39 AM

## 2022-10-02 NOTE — Progress Notes (Signed)
Gastroenterology Inpatient Follow-up Note   PATIENT IDENTIFICATION  [name] [name] is a 67 y.o. male  SUBJECTIVE  The patient's chart has been reviewed.  He remains on pressor support. The patient's labs have been reviewed.  LFTs have not significantly down trended.  White count has significantly decreased.  Patient has GNR bacteremia on blood culture. The patient remains intubated. No family at bedside   OBJECTIVE  Scheduled Inpatient Medications:   Chlorhexidine Gluconate Cloth  6 each Topical Daily   digoxin  0.125 mg Oral Daily   docusate  100 mg Per Tube BID   hydrocortisone sod succinate (SOLU-CORTEF) inj  100 mg Intravenous Q8H   insulin aspart  0-9 Units Subcutaneous Q4H   mouth rinse  15 mL Mouth Rinse Q2H   pantoprazole (PROTONIX) IV  40 mg Intravenous Q12H   polyethylene glycol  17 g Per Tube Daily   sodium chloride flush  3 mL Intravenous Q12H   Continuous Inpatient Infusions:   ceFEPime (MAXIPIME) IV Stopped (10/14/2022 0011)   dextrose 25 mL/hr at 09/30/2022 0659   epinephrine Stopped (10/20/2022 0551)   magnesium sulfate bolus IVPB Stopped (10/26/2022 1606)   metronidazole Stopped (10/22/2022 2140)   norepinephrine (LEVOPHED) Adult infusion 19.84 mcg/min (10/05/2022 0908)   phenylephrine (NEO-SYNEPHRINE) Adult infusion Stopped (10/08/2022 1731)   propofol (DIPRIVAN) infusion 15 mcg/kg/min (10/04/2022 0412)   vasopressin 0.03 Units/min (10/11/2022 0659)   PRN Inpatient Medications: HYDROmorphone (DILAUDID) injection, ondansetron **OR** ondansetron (ZOFRAN) IV, mouth rinse, phenylephrine   Physical Examination  Temp:  [96.8 F (36 C)-99.7 F (37.6 C)] 99.5 F (37.5 C) (01/06 0500) Pulse Rate:  [82-92] 90 (01/06 0406) Resp:  [18-24] 19 (01/06 0406) BP: (94-134)/(24-81) 125/75 (01/06 0406) SpO2:  [99 %-100 %] 100 % (01/06 0406) Arterial Line BP: (95-131)/(60-91) 125/77 (01/06 0500) FiO2 (%):  [50 %-100 %] 50 % (01/06 0818) Weight:  [63.2 kg] 63.2 kg (01/06 0500) Temp  (24hrs), Avg:98.1 F (36.7 C), Min:96.8 F (36 C), Max:99.7 F (37.6 C)  Weight: 63.2 kg GEN: Intubated, appears chronically ill, nontoxic in appearance PSYCH: Sedated EYE: Sclerae icteric ENT: Dry MM CV: Tachycardic RESP: No audible wheezing GI: NABS, soft, protuberant abdomen, rounded, without rebound or guarding MSK/EXT: Trace bilateral pedal edema SKIN: No rashes NEURO: Unable to assess   Review of Data   Laboratory Studies   Recent Labs  Lab 10/27/2022 0529 10/25/2022 0841 10/13/2022 0428  NA 136   < > 134*  K 2.7*   < > 4.1  CL 105   < > 105  CO2 19*   < > 14*  BUN 10   < > 20  CREATININE 1.02   < > 1.44*  GLUCOSE 51*   < > 215*  CALCIUM 8.2*   < > 7.4*  MG 1.5*  --  1.9  PHOS 3.0  --  4.7*   < > = values in this interval not displayed.   Recent Labs  Lab 10/07/2022 0428  AST 420*  ALT 117*  ALKPHOS 795*    Recent Labs  Lab 09/30/22 0405 10/16/2022 0529 10/10/2022 0428  WBC 13.1* 38.7* 25.0*  HGB 9.8* 11.2* 9.6*  HCT 27.8* 32.7* 28.0*  PLT 205 196 145*   Recent Labs  Lab 09/30/22 0405  INR 1.3*    Imaging Studies  DG Abd 1 View  Result Date: 10/12/2022 CLINICAL DATA:  Evaluate enteric tube placement. EXAM: ABDOMEN - 1 VIEW COMPARISON:  CT 09/15/2022 FINDINGS: Interval placement of enteric tube with tip and  side port in the gastric fundus below the level of the GE junction. Common bile duct stent is identified. There is diffuse gaseous distension of the small bowel loops which measure up to 3 cm. IMPRESSION: 1. Interval placement of enteric tube with tip and side port in the gastric fundus. 2. Diffuse gaseous distension of the small bowel loops. Which may reflect small bowel ileus or obstruction. Electronically Signed   By: Kerby Moors M.D.   On: 09/27/2022 20:21   ECHOCARDIOGRAM COMPLETE  Result Date: 10/04/2022    ECHOCARDIOGRAM REPORT   Patient Name:   [name] [name] Date of Exam: 10/04/2022 Medical Rec #:  [id number]      Height:       73.0 in Accession  #:    [id number]     Weight:       137.9 lb Date of Birth:  02-02-1956     BSA:          1.837 m Patient Age:    33 years       BP:           134/63 mmHg Patient Gender: M              HR:           88 bpm. Exam Location:  Inpatient Procedure: 2D Echo and Intracardiac Opacification Agent Indications:    cardiomyopathy  History:        Patient has prior history of Echocardiogram examinations, most                 recent 10/30/2021. Cardiomyopathy and CHF; Risk                 Factors:Hypertension and Current Smoker.  Sonographer:    Harvie Junior Referring Phys: [place]  1. Left ventricular ejection fraction, by estimation, is <20%. The left ventricle has severely decreased function. The left ventricle demonstrates global hypokinesis. The left ventricular internal cavity size was severely dilated. There is mild concentric left ventricular hypertrophy. Left ventricular diastolic parameters are indeterminate.  2. Right ventricular systolic function is severely reduced. The right ventricular size is mildly enlarged. There is normal pulmonary artery systolic pressure.  3. Left atrial size was mildly dilated.  4. No evidence of mitral valve regurgitation.  5. Tricuspid valve regurgitation is mild to moderate.  6. The aortic valve is tricuspid. Aortic valve regurgitation is not visualized.  7. The inferior vena cava is normal in size with greater than 50% respiratory variability, suggesting right atrial pressure of 3 mmHg. Comparison(s): No significant change from prior study. FINDINGS  Left Ventricle: Left ventricular ejection fraction, by estimation, is <20%. The left ventricle has severely decreased function. The left ventricle demonstrates global hypokinesis. Definity contrast agent was given IV to delineate the left ventricular endocardial borders. The left ventricular internal cavity size was severely dilated. There is mild concentric left ventricular hypertrophy. Left ventricular diastolic  parameters are indeterminate. Right Ventricle: The right ventricular size is mildly enlarged. Right ventricular systolic function is severely reduced. There is normal pulmonary artery systolic pressure. The tricuspid regurgitant velocity is 2.74 m/s, and with an assumed right atrial pressure of 3 mmHg, the estimated right ventricular systolic pressure is 56.4 mmHg. Left Atrium: Left atrial size was mildly dilated. Right Atrium: Right atrial size was normal in size. Pericardium: There is no evidence of pericardial effusion. Mitral Valve: No evidence of mitral valve regurgitation. Tricuspid Valve: Tricuspid valve regurgitation is mild to moderate. Aortic  Valve: The aortic valve is tricuspid. Aortic valve regurgitation is not visualized. Aortic regurgitation PHT measures 445 msec. Aortic valve mean gradient measures 1.0 mmHg. Aortic valve peak gradient measures 2.1 mmHg. Aortic valve area, by VTI measures 3.26 cm. Pulmonic Valve: Pulmonic valve regurgitation is not visualized. Aorta: The aortic root is normal in size and structure. Venous: The inferior vena cava is normal in size with greater than 50% respiratory variability, suggesting right atrial pressure of 3 mmHg. IAS/Shunts: No atrial level shunt detected by color flow Doppler. Additional Comments: A device lead is visualized.  LEFT VENTRICLE PLAX 2D LVIDd:         6.00 cm      Diastology LVIDs:         5.90 cm      LV e' medial:    3.15 cm/s LV PW:         1.10 cm      LV E/e' medial:  21.8 LV IVS:        1.10 cm      LV e' lateral:   5.66 cm/s LVOT diam:     2.50 cm      LV E/e' lateral: 12.1 LV SV:         38 LV SV Index:   21 LVOT Area:     4.91 cm  LV Volumes (MOD) LV vol d, MOD A2C: 224.0 ml LV vol d, MOD A4C: 200.0 ml LV vol s, MOD A2C: 212.0 ml LV vol s, MOD A4C: 183.0 ml LV SV MOD A2C:     12.0 ml LV SV MOD A4C:     200.0 ml LV SV MOD BP:      18.2 ml RIGHT VENTRICLE RV Basal diam:  4.10 cm RV Mid diam:    3.70 cm RV S prime:     9.36 cm/s TAPSE  (M-mode): 1.2 cm LEFT ATRIUM           Index        RIGHT ATRIUM           Index LA diam:      3.70 cm 2.01 cm/m   RA Area:     13.10 cm LA Vol (A4C): 57.5 ml 31.30 ml/m  RA Volume:   35.10 ml  19.11 ml/m  AORTIC VALVE                    PULMONIC VALVE AV Area (Vmax):    3.84 cm     PV Vmax:       1.51 m/s AV Area (Vmean):   3.47 cm     PV Peak grad:  9.1 mmHg AV Area (VTI):     3.26 cm AV Vmax:           73.30 cm/s AV Vmean:          48.900 cm/s AV VTI:            0.116 m AV Peak Grad:      2.1 mmHg AV Mean Grad:      1.0 mmHg LVOT Vmax:         57.40 cm/s LVOT Vmean:        34.600 cm/s LVOT VTI:          0.077 m LVOT/AV VTI ratio: 0.66 AI PHT:            445 msec  AORTA Ao Root diam: 3.90 cm MITRAL VALVE  TRICUSPID VALVE MV Area (PHT): 6.12 cm    TR Peak grad:   30.0 mmHg MV Decel Time: 124 msec    TR Vmax:        274.00 cm/s MR Peak grad: 39.4 mmHg MR Vmax:      314.00 cm/s  SHUNTS MV E velocity: 68.60 cm/s  Systemic VTI:  0.08 m MV A velocity: 53.60 cm/s  Systemic Diam: 2.50 cm MV E/A ratio:  1.28 Landscape architect signed by Phineas Inches Signature Date/Time: 10/16/2022/5:32:27 PM    Final    DG CHEST PORT 1 VIEW  Result Date: 09/30/2022 CLINICAL DATA:  Acute respiratory failure EXAM: PORTABLE CHEST 1 VIEW COMPARISON:  09/22/2022 FINDINGS: Single lead pacer is in place. Endotracheal tube tip is just below the thoracic inlet, about 6.5 cm above the carina. Right IJ central line tip: SVC. No pneumothorax. Atherosclerotic calcification of the aortic arch. Mild enlargement of the cardiopericardial silhouette, without overt edema. Low lung volumes are present, causing crowding of the pulmonary vasculature. IMPRESSION: 1. Mild enlargement of the cardiopericardial silhouette, without edema. 2. Low lung volumes are present, causing crowding of the pulmonary vasculature. 3. Endotracheal tube tip is about 6.5 cm above the carina. 4.  Aortic Atherosclerosis (ICD10-I70.0). Electronically Signed    By: Van Clines M.D.   On: 09/28/2022 16:38   DG ERCP  Result Date: 10/25/2022 CLINICAL DATA:  Biliary ductal dilation EXAM: ERCP COMPARISON:  ERCP, 09/17/2022. CT AP, 09/15/2022. US Abdomen, 10/08/2022. FLUOROSCOPY: Exposure Index (as provided by the fluoroscopic device): 33.9 mGy Kerma FINDINGS: Multiple, limited oblique planar images of the RIGHT upper quadrant obtained C-arm. Images demonstrating flexible endoscopy, biliary duct cannulation, sphincterotomy, retrograde cholangiogram and balloon sweep. Intra-and extrahepatic biliary ductal dilatation is present. No discrete filling defect is demonstrated. Removal pre-existing plastic biliary stent and placement of metallic stent. IMPRESSION: Fluoroscopic imaging for ERCP and metallic biliary stent placement. For complete description of intra procedural findings, please see performing service dictation. Electronically Signed   By: Michaelle Birks M.D.   On: 10/14/2022 15:08   VAS [place] LOWER EXTREMITY VENOUS (DVT)  Result Date: 09/28/2022  Lower Venous DVT Study Patient Name:  [name] [name]  Date of Exam:   10/05/2022 Medical Rec #: [id number]       Accession #:    [id number] Date of Birth: 30-Jan-1956      Patient Gender: M Patient Age:   63 years Exam Location:  [facility] Procedure:      VAS [place] LOWER EXTREMITY VENOUS (DVT) Referring Phys: Annamaria Boots XU --------------------------------------------------------------------------------  Indications: Edema.  Risk Factors: Cancer. Limitations: Poor ultrasound/tissue interface, body habitus and patient positioning. Comparison Study: No prior studies. Performing Technologist: Oliver Hum RVT  Examination Guidelines: A complete evaluation includes B-mode imaging, spectral Doppler, color Doppler, and power Doppler as needed of all accessible portions of each vessel. Bilateral testing is considered an integral part of a complete examination. Limited examinations for reoccurring indications may be performed as  noted. The reflux portion of the exam is performed with the patient in reverse Trendelenburg.  +-----+---------------+---------+-----------+----------+--------------+ RIGHTCompressibilityPhasicitySpontaneityPropertiesThrombus Aging +-----+---------------+---------+-----------+----------+--------------+ CFV  Full           Yes      Yes                                 +-----+---------------+---------+-----------+----------+--------------+   +---------+---------------+---------+-----------+----------+--------------+ LEFT     CompressibilityPhasicitySpontaneityPropertiesThrombus Aging +---------+---------------+---------+-----------+----------+--------------+ CFV      Full  Yes      Yes                                 +---------+---------------+---------+-----------+----------+--------------+ SFJ      Full                                                        +---------+---------------+---------+-----------+----------+--------------+ FV Prox  Full                                                        +---------+---------------+---------+-----------+----------+--------------+ FV Mid   Full                                                        +---------+---------------+---------+-----------+----------+--------------+ FV DistalFull                                                        +---------+---------------+---------+-----------+----------+--------------+ PFV      Full                                                        +---------+---------------+---------+-----------+----------+--------------+ POP      Full           Yes      Yes                                 +---------+---------------+---------+-----------+----------+--------------+ PTV      Full                                                        +---------+---------------+---------+-----------+----------+--------------+ PERO     Full                                                         +---------+---------------+---------+-----------+----------+--------------+    Summary: RIGHT: - No evidence of common femoral vein obstruction.  LEFT: - There is no evidence of deep vein thrombosis in the lower extremity.  - No cystic structure found in the popliteal fossa.  *See table(s) above for measurements and observations.    Preliminary     GI Procedures and Studies  ERCP - Esophageal plaques were found, suspicious for candidiasis - cytology brushings performed. -  2 cm hiatal hernia. - No other gross lesions in the entire stomach. - No gross lesions in the duodenal bulb, in the first portion of the duodenum and in the second portion of the duodenum. - Prior biliary sphincterotomy appeared open. The major papilla appeared edematous. Pus was seen in the major papilla. - One partially occluded stent from the biliary tree was seen in the major papilla. This was removed. - A single moderate biliary stricture was found in the lower third of the main bile duct. The stricture was malignant appearing. - The upper third of the main bile duct, middle third of the main bile duct and left and right hepatic ducts and all intrahepatic branches were moderately dilated, secondary to aforementioned stricture. - One uncovered metal biliary stent was placed into the common bile duct. Adequate drainage of pus and cholangitis was noted today.   ASSESSMENT  [name] [name] is a 67 y.o. male with a pmh significant for CHF, hypertension, recently diagnosed malignant biliary cancer.  The patient is stable on pressor support.  I would have expected his LFTs to have down trended further but I suspect in the setting of his shock/hypotension both from a cardiogenic and septic physiology that we may see liver tests stagnate for a bit.  I am very hopeful that with the self-expanding metal stent that we had very good drainage without evidence of persistent pus coming out at the end and all contrast  drained completely from the intrahepatics that we should see further improvement.  The gallbladder did not fill so as the ultrasound suggested some inspissated sludge it is possible that the gallbladder could also still be an issue that may require [place] to evaluate that if LFTs do not improve further.  He is in a critical status but hopefully he will be able to be weaned from his pressor support and extubated as per the ICU service.  Again thank you ICU for taking care of him and thank you to our anesthesia colleagues after his ERCP was completed for aggressive resuscitation and placement of central line and arterial line.  The KUB that suggested the possibility of an ileus is not clearly defined to me as of yet.  I had wanted to perform a colonoscopy this weekend to evaluate the sigmoid thickening that had been noted previously as well as the elevated CEA.  That is all on hold currently.  If the patient does not have further clinical improvement, then we may want to get an updated CT abdomen/pelvis to evaluate and ensure nothing else is being missed.  Once the patient has further clinical improvement, then I think a sigmoidoscopy at a minimum makes sense to evaluate that area because of the elevated CEA level.  We will monitor the patient along with you.   PLAN/RECOMMENDATIONS  Trend LFTs Colonoscopy/sigmoidoscopy on hold for now Continue antibiotics as you are doing Follow-up cytology Eventually will need Candida esophagitis treatment but not absolutely necessary currently especially while LFTs still remain slightly abnormal No contraindication from a GI standpoint for enteral nutrition If leukocytosis and patient's clinical status do not continue to have improvement consider updated cross-sectional imaging with a CT abdomen/pelvis   Please page/call with questions or concerns.   Justice Britain, MD [facility] Office # [phone]    LOS: 3 days  Irving Copas  10/23/2022, 9:41 AM

## 2022-10-02 NOTE — Progress Notes (Signed)
PHARMACY - PHYSICIAN COMMUNICATION CRITICAL VALUE ALERT - BLOOD CULTURE IDENTIFICATION (BCID)  [name] [name] is an 67 y.o. male who presented to [facility] on 10/22/2022 with a chief complaint of elevated bilirubin, persistent diarrhea.   Assessment:   He is admitted with persistent hyperbilirubinemia and concern for recurrent biliary duct stricture. 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 Pt underwent repeat ERCP with stent exchange on 1/5 with findings of purulent discharge cholangitis.    Currently requiring multiple pressors for septic shock.  2/2 Blood cx now + GNR; BCID + Klebsiella pneumoniae without resistance.   Name of physician (or Provider) Contacted: Oletta Darter  Current antibiotics: Cefepime + Vancomycin + Flagyl  Changes to prescribed antibiotics recommended:  Based on blood cx, would recommend de-escalate to Rocephin + Flagyl.   Given critical illness with defer clinical decision to rounding provider- next dose of Vancomycin due at 1800.   Results for orders placed or performed during the hospital encounter of 10/08/2022  Blood Culture ID Panel (Reflexed) (Collected: 10/13/2022  8:41 AM)  Result Value Ref Range   Enterococcus faecalis NOT DETECTED NOT DETECTED   Enterococcus Faecium NOT DETECTED NOT DETECTED   Listeria monocytogenes NOT DETECTED NOT DETECTED   Staphylococcus species NOT DETECTED NOT DETECTED   Staphylococcus aureus (BCID) NOT DETECTED NOT DETECTED   Staphylococcus epidermidis NOT DETECTED NOT DETECTED   Staphylococcus lugdunensis NOT DETECTED NOT DETECTED   Streptococcus species NOT DETECTED NOT DETECTED   Streptococcus agalactiae NOT DETECTED NOT DETECTED   Streptococcus pneumoniae NOT DETECTED NOT DETECTED   Streptococcus pyogenes NOT DETECTED NOT DETECTED   A.calcoaceticus-baumannii NOT DETECTED NOT DETECTED   Bacteroides fragilis NOT DETECTED NOT DETECTED   Enterobacterales  DETECTED (A) NOT DETECTED   Enterobacter cloacae complex NOT DETECTED NOT DETECTED   Escherichia coli NOT DETECTED NOT DETECTED   Klebsiella aerogenes NOT DETECTED NOT DETECTED   Klebsiella oxytoca NOT DETECTED NOT DETECTED   Klebsiella pneumoniae DETECTED (A) NOT DETECTED   Proteus species NOT DETECTED NOT DETECTED   Salmonella species NOT DETECTED NOT DETECTED   Serratia marcescens NOT DETECTED NOT DETECTED   Haemophilus influenzae NOT DETECTED NOT DETECTED   Neisseria meningitidis NOT DETECTED NOT DETECTED   Pseudomonas aeruginosa NOT DETECTED NOT DETECTED   Stenotrophomonas maltophilia NOT DETECTED NOT DETECTED   Candida albicans NOT DETECTED NOT DETECTED   Candida auris NOT DETECTED NOT DETECTED   Candida glabrata NOT DETECTED NOT DETECTED   Candida krusei NOT DETECTED NOT DETECTED   Candida parapsilosis NOT DETECTED NOT DETECTED   Candida tropicalis NOT DETECTED NOT DETECTED   Cryptococcus neoformans/gattii NOT DETECTED NOT DETECTED   CTX-M ESBL NOT DETECTED NOT DETECTED   Carbapenem resistance IMP NOT DETECTED NOT DETECTED   Carbapenem resistance KPC NOT DETECTED NOT DETECTED   Carbapenem resistance NDM NOT DETECTED NOT DETECTED   Carbapenem resist OXA 48 LIKE NOT DETECTED NOT DETECTED   Carbapenem resistance VIM NOT DETECTED NOT DETECTED    Netta Cedars PharmD 09/29/2022  12:57 AM

## 2022-10-02 NOTE — Consult Note (Signed)
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  Since admission patient has been evaluated by oncology, GI, and required escalation of care to ICU with PCCM team.  Patient underwent ERCP on 10/18/2022 which found esophageal plaques suspicious for candidiasis, 1 partially occluded stent from the biliary tree was seen in the major papula which was removed, pus was seen in the major papula, another moderate biliary stricture was found in the lower third of the main bile duct which was malignant appearing, and so uncovered metal biliary stent was placed into the common bile duct which resulted in adequate drainage of pus.  After procedure patient was transferred to the ICU for management of septic shock and is currently requiring pressors for support of hypotension.  PCCM talked with family on 10/05/2022 and patient's CODE STATUS was changed to DNR.  Patient remains intubated after procedure in the ICU.  Palliative medicine team consulted to assist with complex medical decision making.  Extensive review of EMR prior to seeing patient. Discussed care with PCCM provider.  Presented to bedside.  Bedside RN present and able to update me regarding patient's medical status.  Patient remains on multiple pressors for hypotension support.  Patient's leukocytosis improving though LFTs remain elevated.  Blood cultures positive for Klebsiella.  Patient unable to wean from  ventilator support at this time.  Cardiac function remains a concern with echo performed on 09/27/2022 showing EF is less than 20%.  No family present at bedside during visit.  Unable to reach family at this time encouraged further discussions regarding overall medical goals for care.  In demographics patient has 2 daughters and her brother listed as his contacts.  Primary Diagnoses  Present on Admission:  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[place])  Chronic systolic CHF (congestive heart failure) (HCC)  Biliary obstruction due to malignant neoplasm [facility])  ICD (implantable cardioverter-defibrillator) in place  Hypokalemia  Abnormal CT scan, sigmoid colon   Palliative Review of Systems: Able to obtain from patient secondary to medical status.  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[place]) 78/24/2353   Chronic systolic CHF (congestive heart failure) ([place]) 01/31/2015   Coronary artery disease    Hypertension    Nonischemic cardiomyopathy ([place]) 06/30/2015   Social History   Socioeconomic History   Marital status: Single    Spouse name: Not on file   Number of children: Not on file   Years of education: Not on file   Highest education level: Not on file  Occupational History   Not on file  Tobacco Use   Smoking status: Every Day    Packs/day: 1.00    Years: 40.00    Total pack years: 40.00    Types: Cigarettes   Smokeless tobacco: Never  Vaping Use   Vaping Use: Every day  Substance and Sexual Activity   Alcohol use: Not Currently    Comment: 07/09/2015 "I haven't had a drink since last of 12/2014"  Drug use: Not Currently    Types: "Crack" cocaine, Marijuana    Comment: 07/09/2015 "I did drugs a long time;ago  quit in the mid 1980's"   Sexual activity: Not Currently  Other Topics Concern   Not on file  Social History Narrative   Not on file   Social Determinants of Health   Financial Resource Strain: Not on  file  Food Insecurity: Food Insecurity Present (09/30/2022)   Hunger Vital Sign    Worried About Running Out of Food in the Last Year: Never true    Ran Out of Food in the Last Year: Sometimes true  Transportation Needs: No Transportation Needs (09/30/2022)   PRAPARE - Hydrologist (Medical): No    Lack of Transportation (Non-Medical): No  Physical Activity: Not on file  Stress: Not on file  Social Connections: Not on file   Family History  Problem Relation Age of Onset   Stroke Mother    Heart Problems Other    Scheduled Meds:  Chlorhexidine Gluconate Cloth  6 each Topical Daily   digoxin  0.125 mg Oral Daily   docusate  100 mg Per Tube BID   hydrocortisone sod succinate (SOLU-CORTEF) inj  100 mg Intravenous Q8H   insulin aspart  0-9 Units Subcutaneous Q4H   mouth rinse  15 mL Mouth Rinse Q2H   pantoprazole (PROTONIX) IV  40 mg Intravenous Q12H   polyethylene glycol  17 g Per Tube Daily   sodium chloride flush  3 mL Intravenous Q12H   Continuous Infusions:  ceFEPime (MAXIPIME) IV Stopped (10/10/2022 0011)   dextrose 25 mL/hr at 10/13/2022 0659   epinephrine Stopped (10/24/2022 0551)   magnesium sulfate bolus IVPB Stopped (10/24/2022 1606)   metronidazole 500 mg (10/08/2022 0951)   norepinephrine (LEVOPHED) Adult infusion 18 mcg/min (10/10/2022 1233)   phenylephrine (NEO-SYNEPHRINE) Adult infusion Stopped (10/12/2022 1731)   propofol (DIPRIVAN) infusion 15 mcg/kg/min (10/08/2022 0412)   vasopressin 0.03 Units/min (10/12/2022 1230)   PRN Meds:.HYDROmorphone (DILAUDID) injection, ondansetron **OR** ondansetron (ZOFRAN) IV, mouth rinse, phenylephrine No Known Allergies CBC:    Component Value Date/Time   WBC 25.0 (H) 09/28/2022 0428   HGB 9.6 (L) 10/04/2022 0428   HGB 11.5 (L) 10/26/2022 1107   HCT 28.0 (L) 10/03/2022 0428   PLT 145 (L) 10/07/2022 0428   PLT 265 09/28/2022 1107   MCV 97.6 10/14/2022 0428   NEUTROABS 22.4 (H) 10/07/2022 0428   LYMPHSABS 1.8  10/18/2022 0428   MONOABS 0.5 10/24/2022 0428   EOSABS 0.1 10/04/2022 0428   BASOSABS 0.1 10/17/2022 0428   Comprehensive Metabolic Panel:    Component Value Date/Time   NA 134 (L) 10/14/2022 0428   K 4.1 10/21/2022 0428   CL 105 10/13/2022 0428   CO2 14 (L) 10/10/2022 0428   BUN 20 09/30/2022 0428   CREATININE 1.44 (H) 10/08/2022 0428   CREATININE 1.07 10/25/2022 1107   CREATININE 0.95 06/30/2015 1711   GLUCOSE 215 (H) 10/13/2022 0428   CALCIUM 7.4 (L) 10/19/2022 0428   AST 420 (H) 10/13/2022 0428   AST 57 (H) 10/11/2022 1107   ALT 117 (H) 10/17/2022 0428   ALT 39 10/10/2022 1107   ALKPHOS 795 (H) 09/28/2022 0428   BILITOT 12.4 (H) 10/21/2022 0428   BILITOT 14.4 (HH) 10/18/2022 1107   PROT 5.1 (L) 09/28/2022 0428   ALBUMIN 1.9 (L) 10/11/2022 0428    Physical Exam: Vital Signs: BP 125/75   Pulse 90   Temp 99.5 F (37.5  C)   Resp 19   Ht '6\' 1"'$  (1.854 m)   Wt 63.2 kg   SpO2 100%   BMI 18.38 kg/m  SpO2: SpO2: 100 % O2 Device: O2 Device: Ventilator O2 Flow Rate:   Intake/output summary:  Intake/Output Summary (Last 24 hours) at 10/18/2022 1348 Last data filed at 10/10/2022 1200 Gross per 24 hour  Intake 3781.11 ml  Output 900 ml  Net 2881.11 ml   LBM: Last BM Date : 10/23/2022 Baseline Weight: Weight: 58.2 kg Most recent weight: Weight: 63.2 kg  General: NAD, alert Eyes: conjunctiva clear, anicteric sclera HENT: normocephalic, atraumatic, moist mucous membranes Cardiovascular: RRR, no edema in LE b/l Respiratory: no increased work of breathing noted, not in respiratory distress Abdomen: not distended Extremities:  Skin: no rashes or lesions on visible skin Neuro: A&Ox4, following commands easily Psych: appropriately answers all questions          Palliative Performance Scale: 10%               Additional Data Reviewed: Recent Labs    10/27/2022 0529 10/10/2022 1605 10/25/2022 0428  WBC 38.7*  --  25.0*  HGB 11.2*  --  9.6*  PLT 196  --  145*  NA 136 135  134*  BUN '10 11 20  '$ CREATININE 1.02 1.15 1.44*    Imaging: VAS [place] LOWER EXTREMITY VENOUS (DVT)  Lower Venous DVT Study  Patient Name:  BRAEDON SJOGREN  Date of Exam:   10/08/2022 Medical Rec #: [id number]       Accession #:    [id number] Date of Birth: 08-14-1956      Patient Gender: M Patient Age:   67 years Exam Location:  [facility] Procedure:      VAS [place] LOWER EXTREMITY VENOUS (DVT) Referring Phys: Annamaria Boots XU  --------------------------------------------------------------------------------   Indications: Edema.   Risk Factors: Cancer. Limitations: Poor ultrasound/tissue interface, body habitus and patient positioning. Comparison Study: No prior studies.  Performing Technologist: Oliver Hum RVT    Examination Guidelines: A complete evaluation includes B-mode imaging, spectral Doppler, color Doppler, and power Doppler as needed of all accessible portions of each vessel. Bilateral testing is considered an integral part of a complete examination. Limited examinations for reoccurring indications may be performed as noted. The reflux portion of the exam is performed with the patient in reverse Trendelenburg.     +-----+---------------+---------+-----------+----------+--------------+ RIGHTCompressibilityPhasicitySpontaneityPropertiesThrombus Aging +-----+---------------+---------+-----------+----------+--------------+ CFV  Full           Yes      Yes                                 +-----+---------------+---------+-----------+----------+--------------+        +---------+---------------+---------+-----------+----------+--------------+ LEFT     CompressibilityPhasicitySpontaneityPropertiesThrombus Aging +---------+---------------+---------+-----------+----------+--------------+ CFV      Full           Yes      Yes                                 +---------+---------------+---------+-----------+----------+--------------+ SFJ       Full                                                        +---------+---------------+---------+-----------+----------+--------------+  FV Prox  Full                                                        +---------+---------------+---------+-----------+----------+--------------+ FV Mid   Full                                                        +---------+---------------+---------+-----------+----------+--------------+ FV DistalFull                                                        +---------+---------------+---------+-----------+----------+--------------+ PFV      Full                                                        +---------+---------------+---------+-----------+----------+--------------+ POP      Full           Yes      Yes                                 +---------+---------------+---------+-----------+----------+--------------+ PTV      Full                                                        +---------+---------------+---------+-----------+----------+--------------+ PERO     Full                                                        +---------+---------------+---------+-----------+----------+--------------+          Summary: RIGHT: - No evidence of common femoral vein obstruction.   LEFT: - There is no evidence of deep vein thrombosis in the lower extremity.   - No cystic structure found in the popliteal fossa.    *See table(s) above for measurements and observations.  Electronically signed by Orlie Pollen on 10/23/2022 at 1:19:12 PM.      Final   DG Chest Port 1 View CLINICAL DATA:  Acute respiratory failure  EXAM: PORTABLE CHEST 1 VIEW  COMPARISON:  10/21/2022  FINDINGS: There is a ET tube with tip above the carina. Right IJ catheter tip terminates at the cavoatrial junction. Left chest wall ICD is noted with lead in the right ventricle. Stable cardiomediastinal contours. No pleural  effusion or edema. No airspace opacities identified.  IMPRESSION: 1. Stable support apparatus. 2. No acute cardiopulmonary abnormalities.  Electronically Signed   By: Kerby Moors M.D.   On: 10/27/2022 09:40    I personally reviewed recent imaging.  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  Since admission patient has been evaluated by oncology, GI, and required escalation of care to ICU with PCCM team.  Patient underwent ERCP on 10/16/2022 which found esophageal plaques suspicious for candidiasis, 1 partially occluded stent from the biliary tree was seen in the major papula which was removed, pus was seen in the major papula, another moderate biliary stricture was found in the lower third of the main bile duct which was malignant appearing, and so uncovered metal biliary stent was placed into the common bile duct which resulted in adequate drainage of pus.  After procedure patient was transferred to the ICU for management of septic shock and is currently requiring pressors for support of hypotension.  PCCM talked with family on 10/05/2022 and patient's CODE STATUS was changed to DNR.  Patient remains intubated after procedure in the ICU.  Palliative medicine team consulted to assist with complex medical decision making.  # Complex medical decision making/goals of care  -Patient unable to discuss complex medical decision making due to to his current medical status, remains intubated on pressor support in the ICU.  Unable to reach family for further discussions and no family present at bedside during visit. Overall medical status is  concerning in the setting of not only his malignancy but also his severe cardiac dysfunction. Palliative medicine team will continue to follow along to discuss care with family and patient as able.    -  Code Status: DNR   # Symptom management  -As per primary team  # Psycho-social/Spiritual Support:  - Support System: 2 daughters and brother named in chart  -No ACP documents listed in EMR  # Discharge Planning:  TBD  Thank you for allowing the palliative care team to participate in the care Claudean Severance.  Chelsea Aus, DO Palliative Care Provider PMT # [phone]  If patient remains symptomatic despite maximum doses, please call PMT at [phone] between 0700 and 1900. Outside of these hours, please call attending, as PMT does not have night coverage.  This provider spent a total of 80 minutes providing patient's care.  Includes review of EMR, discussing care with other staff members involved in patient's medical care, obtaining relevant history and information from patient and/or patient's family, and personal review of imaging and lab work. Greater than 50% of the time was spent counseling and coordinating care related to the above assessment and plan.

## 2022-10-03 ENCOUNTER — Inpatient Hospital Stay (HOSPITAL_COMMUNITY): Payer: Medicare Other

## 2022-10-03 DIAGNOSIS — C241 Malignant neoplasm of ampulla of Vater: Secondary | ICD-10-CM | POA: Diagnosis not present

## 2022-10-03 LAB — GASTROINTESTINAL PANEL BY PCR, STOOL (REPLACES STOOL CULTURE)

## 2022-10-03 LAB — BILIRUBIN, DIRECT: Bilirubin, Direct: 4.7 mg/dL — ABNORMAL HIGH (ref 0.0–0.2)

## 2022-10-03 LAB — CBC WITH DIFFERENTIAL/PLATELET
Abs Immature Granulocytes: 0.1 10*3/uL — ABNORMAL HIGH (ref 0.00–0.07)
Basophils Absolute: 0 10*3/uL (ref 0.0–0.1)
Basophils Relative: 0 %
Eosinophils Absolute: 0 10*3/uL (ref 0.0–0.5)
Eosinophils Relative: 0 %
HCT: 27.2 % — ABNORMAL LOW (ref 39.0–52.0)
Hemoglobin: 9.6 g/dL — ABNORMAL LOW (ref 13.0–17.0)
Immature Granulocytes: 1 %
Lymphocytes Relative: 7 %
Lymphs Abs: 1.2 10*3/uL (ref 0.7–4.0)
MCH: 33 pg (ref 26.0–34.0)
MCHC: 35.3 g/dL (ref 30.0–36.0)
MCV: 93.5 fL (ref 80.0–100.0)
Monocytes Absolute: 0.3 10*3/uL (ref 0.1–1.0)
Monocytes Relative: 2 %
Neutro Abs: 15.8 10*3/uL — ABNORMAL HIGH (ref 1.7–7.7)
Neutrophils Relative %: 90 %
Platelets: UNDETERMINED 10*3/uL (ref 150–400)
RBC: 2.91 MIL/uL — ABNORMAL LOW (ref 4.22–5.81)
RDW: 14.4 % (ref 11.5–15.5)
WBC: 17.5 10*3/uL — ABNORMAL HIGH (ref 4.0–10.5)
nRBC: 0.2 % (ref 0.0–0.2)

## 2022-10-03 LAB — GLUCOSE, CAPILLARY
Glucose-Capillary: 133 mg/dL — ABNORMAL HIGH (ref 70–99)
Glucose-Capillary: 143 mg/dL — ABNORMAL HIGH (ref 70–99)
Glucose-Capillary: 148 mg/dL — ABNORMAL HIGH (ref 70–99)
Glucose-Capillary: 151 mg/dL — ABNORMAL HIGH (ref 70–99)
Glucose-Capillary: 154 mg/dL — ABNORMAL HIGH (ref 70–99)
Glucose-Capillary: 155 mg/dL — ABNORMAL HIGH (ref 70–99)

## 2022-10-03 LAB — CULTURE, BLOOD (ROUTINE X 2)
Special Requests: ADEQUATE
Special Requests: ADEQUATE

## 2022-10-03 LAB — URINE CULTURE: Culture: NO GROWTH

## 2022-10-03 LAB — COMPREHENSIVE METABOLIC PANEL
ALT: 110 U/L — ABNORMAL HIGH (ref 0–44)
AST: 204 U/L — ABNORMAL HIGH (ref 15–41)
Albumin: 1.8 g/dL — ABNORMAL LOW (ref 3.5–5.0)
Alkaline Phosphatase: 648 U/L — ABNORMAL HIGH (ref 38–126)
Anion gap: 9 (ref 5–15)
BUN: 33 mg/dL — ABNORMAL HIGH (ref 8–23)
CO2: 17 mmol/L — ABNORMAL LOW (ref 22–32)
Calcium: 7.1 mg/dL — ABNORMAL LOW (ref 8.9–10.3)
Chloride: 108 mmol/L (ref 98–111)
Creatinine, Ser: 1.28 mg/dL — ABNORMAL HIGH (ref 0.61–1.24)
GFR, Estimated: >60 mL/min (ref >60)
Glucose, Bld: 157 mg/dL — ABNORMAL HIGH (ref 70–99)
Potassium: 3.2 mmol/L — ABNORMAL LOW (ref 3.5–5.1)
Sodium: 134 mmol/L — ABNORMAL LOW (ref 135–145)
Total Bilirubin: 9 mg/dL — ABNORMAL HIGH (ref 0.3–1.2)
Total Protein: 4.8 g/dL — ABNORMAL LOW (ref 6.5–8.1)

## 2022-10-03 LAB — LACTIC ACID, PLASMA: Lactic Acid, Venous: 1.4 mmol/L (ref 0.5–1.9)

## 2022-10-03 MED ORDER — CEFAZOLIN SODIUM-DEXTROSE 2-4 GM/100ML-% IV SOLN
2.0000 g | Freq: Three times a day (TID) | INTRAVENOUS | Status: AC
  Administered 2022-10-03 – 2022-10-08 (×16): 2 g via INTRAVENOUS
  Filled 2022-10-03 (×16): qty 100

## 2022-10-03 MED ORDER — POTASSIUM CHLORIDE 10 MEQ/50ML IV SOLN
10.0000 meq | INTRAVENOUS | Status: AC
  Administered 2022-10-03 (×5): 10 meq via INTRAVENOUS
  Filled 2022-10-03 (×6): qty 50

## 2022-10-03 NOTE — Progress Notes (Signed)
NAME:  [name] [name], MRN:  [id number], DOB:  March 23, 1956, LOS: 4 ADMISSION DATE:  10/14/2022, CONSULTATION DATE: 10/19/2022 REFERRING MD: Dr. Rush Landmark, CHIEF COMPLAINT: Septic shock  History of Present Illness:   67 year old with a history of nonischemic cardiomyopathy [EF 25%], hypertension, diagnosis of cholangiocarcinoma malignant biliary stricture s/p stenting on 12/22 treated with stent obstruction.  WBC count elevated to 38.7 on 1/5.  He was given a dose of Unasyn.  He underwent repeat ERCP and stent replacement with findings of purulent discharge, cholangitis.  He is septic post procedure with shock and PCCM called for admission to ICU.  Pertinent  Medical History    has a past medical history of AICD (automatic cardioverter/defibrillator) present, 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[place]) (46/65/9935), Chronic systolic CHF (congestive heart failure) ([place]) (01/31/2015), Coronary artery disease, Hypertension, and Nonischemic cardiomyopathy ([place]) (06/30/2015).   Significant Hospital Events: Including procedures, antibiotic start and stop dates in addition to other pertinent events   1/3 Admit 1/5 ERCP, transfer to ICU with septic shock. Made DNR yesterday discussion with family.  Echocardiogram shows EF of less than 20%   Interim History / Subjective:   Off epinephrine drip.  Continues on norepinephrine and vasopressin  Objective   Blood pressure 126/87, pulse (!) 50, temperature 98.8 F (37.1 C), resp. rate 16, height '6\' 1"'$  (1.854 m), weight 63.2 kg, SpO2 100 %.    Vent Mode: PSV FiO2 (%):  [40 %-50 %] 40 % Set Rate:  [16 bmp] 16 bmp Vt Set:  [630 mL] 630 mL PEEP:  [5 cmH20] 5 cmH20 Pressure Support:  [5 cmH20] 5 cmH20 Plateau Pressure:  [12 cmH20-15 cmH20] 13 cmH20   Intake/Output Summary (Last 24 hours) at 10/03/2022 0917 Last data filed at 10/03/2022 0900 Gross per 24 hour  Intake 1740.19 ml  Output 1630 ml  Net 110.19 ml   Filed Weights   09/30/22 1818 10/04/2022 0440 10/04/2022 0500   Weight: 58.2 kg 62.6 kg 63.2 kg    Examination: Gen:      No acute distress, cachectic, malnourished, chronically ill-appearing HEENT:  EOMI, sclera anicteric Neck:     No masses; no thyromegaly, ET tube Lungs:    Clear to auscultation bilaterally; normal respiratory effort CV:         Regular rate and rhythm; no murmurs Abd:      + bowel sounds; soft, non-tender; no palpable masses, no distension Ext:    No edema; adequate peripheral perfusion Skin:      Warm and dry; no rash Neuro: Somnolent  Labs/imaging reviewed Significant for sodium 134, potassium 3.2. Creatinine 1.28 LFTs are improving, lactic acid 1.4 WBC better at 17.5 Chest x-ray with no acute cardiopulmonary abnormality  Resolved Hospital Problem list     Assessment & Plan:  Septic shock secondary to cholangitis Biliary obstruction secondary to cholangiocarcinoma Klebsiella bacteremia Post ERCP with stent replacement Antibiotic coverage with cefepime, Flagyl.   Acute respiratory failure secondary to sepsis Start PSV trials Wean down sedation  Chronic heart failure, nonischemic cardiomyopathy.   EF less than 20%.  Reduction in cardiac function likely due to septic cardiomyopathy Venous O2 sat in 68 Hold outpatient hypertension medications Telemetry monitoring.  AKI on chronic kidney disease secondary to shock Monitor urine output and creatinine  Failure to thrive, severe malnutrition present on admission Holding tube feeds due to ileus on abdominal x-ray.  NG tube to suction Will start tube feeds as soon as we are able to  Best Practice (right click and "Reselect all SmartList Selections"  daily)   Diet/type: NPO DVT prophylaxis: prophylactic heparin  GI prophylaxis: PPI Lines: Central line and Arterial Line Foley:  Yes, and it is still needed Code Status:  DNR Last date of multidisciplinary goals of care discussion '[]'$  Family discussion on 1/4  Critical care time:    The patient is critically ill  with multiple organ system failure and requires high complexity decision making for assessment and support, frequent evaluation and titration of therapies, advanced monitoring, review of radiographic studies and interpretation of complex data.   Critical Care Time devoted to patient care services, exclusive of separately billable procedures, described in this note is 35 minutes.   Marshell Garfinkel MD [facility] See Amion for pager  If no response to pager , please call [phone] until 7pm After 7:00 pm call Elink  [phone] 10/03/2022, 9:23 AM   If no response to pager , please call [phone] until 7pm After 7:00 pm call Elink  [phone] 10/03/2022, 9:17 AM

## 2022-10-03 NOTE — Progress Notes (Signed)
[facility] Progress Note Patient Name: [name] [name] DOB: [birth year] MRN: [id number]   Date of Service  10/03/2022  HPI/Events of Note  GI panel from 1/5 all (-), needs enteric precautions D/C'd per protocol   eICU Interventions  Order removed      Intervention Category Minor Interventions: Other:  [name] [name] 10/03/2022, 7:47 PM

## 2022-10-03 NOTE — Progress Notes (Signed)
[facility] ADULT ICU REPLACEMENT PROTOCOL   The patient does apply for the [facility] Adult ICU Electrolyte Replacment Protocol based on the criteria listed below:   1.Exclusion criteria: TCTS, ECMO, Dialysis, and Myasthenia Gravis patients 2. Is GFR >/= 30 ml/min? Yes.    Patient's GFR today is > 60 3. Is SCr </= 2? Yes.   Patient's SCr is 1.28 mg/dL 4. Did SCr increase >/= 0.5 in 24 hours? No. 5.Pt's weight >40kg  Yes.   6. Abnormal electrolyte(s): K+ 3.2  7. Electrolytes replaced per protocol 8.  Call MD STAT for K+ </= 2.5, Phos </= 1, or Mag </= 1 Physician:  Dr Barbaraann Barthel 10/03/2022 6:08 AM

## 2022-10-03 NOTE — Progress Notes (Signed)
  Transition of Care [facility]) Screening Note   Patient Details  Name: [name] [name] Date of Birth: [birth year]   Transition of Care [facility]) CM/SW Contact:    Henrietta Dine, RN Phone Number: 10/03/2022, 5:21 PM    Transition of Care Department [facility]) has reviewed patient and no TOC needs have been identified at this time. We will continue to monitor patient advancement through interdisciplinary progression rounds. If new patient transition needs arise, please place a TOC consult.

## 2022-10-03 NOTE — Progress Notes (Signed)
[facility] Progress Note Patient Name: Ladarian Bonczek DOB: 1956-08-29 MRN: [id number]   Date of Service  10/03/2022  HPI/Events of Note  Patient had KUB that showed possible ileus. NGT placed, need order for continuous vs LIWS.  eICU Interventions  Ordered low intermittent suction     Intervention Category Intermediate Interventions: Diagnostic test evaluation  Judd Lien 10/03/2022, 1:01 AM

## 2022-10-03 NOTE — Progress Notes (Signed)
Patient was intubated and septic post-ERCP, staff preparing to extubate him at the time of my visit today. No family at bedside. He has had multiple complications related to recent dx of Ampullary carcinoma and malignant bilary stricture and stenting.   Ampullary Carcinoma has a very poor prognosis in addition to having an EF of 20%, malnutrition with albumin of 1.8 and sepsis requiring ICU level care. His all cause 1 year mortality is >70%. I am concerned he may not survive this hospitalization.   Medical interventions and cancer treatments have been focused on relieving the life threatening conditions of his cancer and stabilizing him from infection and other immanent causes of death. We will need to see where he lands in terms of his functional status and mental status after extubation. It would be very reasonable to discuss hospice options.  I have requested RN call me if family arrives at bedside to facilitate additional conversation about goals of care. I strongly recommend not re-intubating this patient if he declines again- there isn't a reversible process with his cancer and heart failure- while getting him through the complications of his cancer obstruction and treating his sepsis aggressively gave him the best chance at living longer this time, if he fails again, we may just be prolonging his death and suffering.  Will be available to support patient and family.  Lane Hacker, DO Palliative Medicine   Time: 35 min

## 2022-10-03 NOTE — Procedures (Signed)
Extubation Procedure Note  Patient Details:   Name: Geovanny Sartin DOB: Feb 26, 1956 MRN: [id number]   Airway Documentation:    Vent end date: 10/03/22 Vent end time: 1225   Evaluation  O2 sats: stable throughout Complications: No apparent complications Patient did tolerate procedure well. Bilateral Breath Sounds: Rhonchi, Diminished   Pt placed on 4L nasal cannula. Pt is able to speak  Tamera Reason 10/03/2022, 12:33 PM

## 2022-10-04 ENCOUNTER — Encounter (HOSPITAL_COMMUNITY): Payer: Self-pay | Admitting: Gastroenterology

## 2022-10-04 DIAGNOSIS — R933 Abnormal findings on diagnostic imaging of other parts of digestive tract: Secondary | ICD-10-CM | POA: Diagnosis not present

## 2022-10-04 DIAGNOSIS — C241 Malignant neoplasm of ampulla of Vater: Secondary | ICD-10-CM | POA: Diagnosis not present

## 2022-10-04 DIAGNOSIS — K831 Obstruction of bile duct: Secondary | ICD-10-CM | POA: Diagnosis not present

## 2022-10-04 DIAGNOSIS — K529 Noninfective gastroenteritis and colitis, unspecified: Secondary | ICD-10-CM

## 2022-10-04 DIAGNOSIS — A419 Sepsis, unspecified organism: Secondary | ICD-10-CM | POA: Diagnosis not present

## 2022-10-04 DIAGNOSIS — B3781 Candidal esophagitis: Secondary | ICD-10-CM

## 2022-10-04 LAB — COMPREHENSIVE METABOLIC PANEL
ALT: 86 U/L — ABNORMAL HIGH (ref 0–44)
AST: 128 U/L — ABNORMAL HIGH (ref 15–41)
Albumin: 1.6 g/dL — ABNORMAL LOW (ref 3.5–5.0)
Alkaline Phosphatase: 529 U/L — ABNORMAL HIGH (ref 38–126)
Anion gap: 8 (ref 5–15)
BUN: 26 mg/dL — ABNORMAL HIGH (ref 8–23)
CO2: 20 mmol/L — ABNORMAL LOW (ref 22–32)
Calcium: 7.6 mg/dL — ABNORMAL LOW (ref 8.9–10.3)
Chloride: 107 mmol/L (ref 98–111)
Creatinine, Ser: 1.01 mg/dL (ref 0.61–1.24)
GFR, Estimated: >60 mL/min (ref >60)
Glucose, Bld: 166 mg/dL — ABNORMAL HIGH (ref 70–99)
Potassium: 2.9 mmol/L — ABNORMAL LOW (ref 3.5–5.1)
Sodium: 135 mmol/L (ref 135–145)
Total Bilirubin: 7.7 mg/dL — ABNORMAL HIGH (ref 0.3–1.2)
Total Protein: 4.7 g/dL — ABNORMAL LOW (ref 6.5–8.1)

## 2022-10-04 LAB — CBC WITH DIFFERENTIAL/PLATELET
Abs Immature Granulocytes: 0 10*3/uL (ref 0.00–0.07)
Band Neutrophils: 2 %
Basophils Absolute: 0 10*3/uL (ref 0.0–0.1)
Basophils Relative: 0 %
Eosinophils Absolute: 0 10*3/uL (ref 0.0–0.5)
Eosinophils Relative: 0 %
HCT: 27 % — ABNORMAL LOW (ref 39.0–52.0)
Hemoglobin: 9.5 g/dL — ABNORMAL LOW (ref 13.0–17.0)
Lymphocytes Relative: 1 %
Lymphs Abs: 0.2 10*3/uL — ABNORMAL LOW (ref 0.7–4.0)
MCH: 33.1 pg (ref 26.0–34.0)
MCHC: 35.2 g/dL (ref 30.0–36.0)
MCV: 94.1 fL (ref 80.0–100.0)
Monocytes Absolute: 0 10*3/uL — ABNORMAL LOW (ref 0.1–1.0)
Monocytes Relative: 0 %
Neutro Abs: 18.8 10*3/uL — ABNORMAL HIGH (ref 1.7–7.7)
Neutrophils Relative %: 97 %
Platelets: 69 10*3/uL — ABNORMAL LOW (ref 150–400)
RBC: 2.87 MIL/uL — ABNORMAL LOW (ref 4.22–5.81)
RDW: 14.7 % (ref 11.5–15.5)
WBC: 19 10*3/uL — ABNORMAL HIGH (ref 4.0–10.5)
nRBC: 0.2 % (ref 0.0–0.2)
nRBC: 1 /100 WBC — ABNORMAL HIGH

## 2022-10-04 LAB — CYTOLOGY - NON PAP

## 2022-10-04 LAB — HEMOGLOBIN A1C
Hgb A1c MFr Bld: 4.7 % — ABNORMAL LOW (ref 4.8–5.6)
Mean Plasma Glucose: 88 mg/dL

## 2022-10-04 LAB — GLUCOSE, CAPILLARY
Glucose-Capillary: 161 mg/dL — ABNORMAL HIGH (ref 70–99)
Glucose-Capillary: 134 mg/dL — ABNORMAL HIGH (ref 70–99)
Glucose-Capillary: 217 mg/dL — ABNORMAL HIGH (ref 70–99)
Glucose-Capillary: 136 mg/dL — ABNORMAL HIGH (ref 70–99)
Glucose-Capillary: 150 mg/dL — ABNORMAL HIGH (ref 70–99)

## 2022-10-04 MED ORDER — FLUCONAZOLE IN SODIUM CHLORIDE 200-0.9 MG/100ML-% IV SOLN
200.0000 mg | INTRAVENOUS | Status: DC
  Administered 2022-10-04 – 2022-10-11 (×8): 200 mg via INTRAVENOUS
  Filled 2022-10-04 (×9): qty 100

## 2022-10-04 MED ORDER — BOOST / RESOURCE BREEZE PO LIQD CUSTOM
1.0000 | Freq: Three times a day (TID) | ORAL | Status: DC
  Administered 2022-10-04 – 2022-10-13 (×16): 1 via ORAL

## 2022-10-04 MED ORDER — POTASSIUM CHLORIDE 10 MEQ/50ML IV SOLN
10.0000 meq | INTRAVENOUS | Status: AC
  Administered 2022-10-04 (×8): 10 meq via INTRAVENOUS
  Filled 2022-10-04 (×8): qty 50

## 2022-10-04 NOTE — Progress Notes (Addendum)
PT Cancellation Note  Patient Details Name: Rigel Filsinger MRN: [id number] DOB: [birth year]   Cancelled Treatment:    Reason Eval/Treat Not Completed: Medical issues which prohibited therapy. Per RN, May be able to have PT in PM when off of pressors.   [place] Office [phone] Weekend [phone]    [name] 10/04/2022, 12:13 PM

## 2022-10-04 NOTE — Progress Notes (Addendum)
Patient ID: [name] [name], male   DOB: [birth year], 67 y.o.   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 Status post ERCP with stent exchange 10/05/2022 abruptly followed by severe sepsis requiring intubation Severe cardiomyopathy with EF 20%-25%  Cultures positive for Enterobacter rallies and Klebsiella On broad-spectrum IV antibiotics  Patient has been extubated Continues on pressors- weaning  WBC 19.0, hemoglobin 9.5/hematocrit 27.0/platelets 69 Sodium 135/potassium 2.9/BUN 26/creatinine 1.01-improved Bili 7.7/alk phos 529/AST 128/ALT 86 Cytology pending  Last KUB 10/08/2022-one loop of distended small bowel  Patient is alert, says he passed the swallowing evaluation, did have a bowel movement today, denies any abdominal pain or nausea     Objective   Vital signs in last 24 hours: Temp:  [98.1 F (36.7 C)-99.1 F (37.3 C)] 98.2 F (36.8 C) (01/08 0702) Pulse Rate:  [46-111] 73 (01/08 0702) Resp:  [11-27] 19 (01/08 0702) BP: (114-150)/(58-131) 144/97 (01/08 0702) SpO2:  [91 %-100 %] 99 % (01/08 0702) Arterial Line BP: (80-148)/(74-94) 99/94 (01/07 1500) FiO2 (%):  [40 %] 40 % (01/07 1200) Last BM Date : 10/03/22 General:   Older African-American male in NAD Heart:  Regular rate and rhythm; no murmurs Lungs: Respirations even and unlabored, lungs CTA bilaterally Abdomen:  Soft, bowel sounds are present, there is no focal tenderness no palpable mass or hepatosplenomegaly Extremities:  Without edema. Neurologic:  Alert and oriented,  grossly normal neurologically. Psych:  Cooperative. Normal mood and affect.  Intake/Output from previous day: 01/07 0701 - 01/08 0700 In: 1707.8 [I.V.:1029.3; NG/GT:50; IV Piggyback:628.5] Out: 7846 [Urine:1220] Intake/Output this shift: No intake/output data  recorded.  Lab Results: Recent Labs    10/13/2022 0428 10/03/22 0352 10/04/22 0404  WBC 25.0* 17.5* 19.0*  HGB 9.6* 9.6* 9.5*  HCT 28.0* 27.2* 27.0*  PLT 145* PLATELET CLUMPS NOTED ON SMEAR, UNABLE TO ESTIMATE 69*   BMET Recent Labs    09/27/2022 0428 10/03/22 0352 10/04/22 0404  NA 134* 134* 135  K 4.1 3.2* 2.9*  CL 105 108 107  CO2 14* 17* 20*  GLUCOSE 215* 157* 166*  BUN 20 33* 26*  CREATININE 1.44* 1.28* 1.01  CALCIUM 7.4* 7.1* 7.6*   LFT Recent Labs    10/03/22 0352 10/04/22 0404  PROT 4.8* 4.7*  ALBUMIN 1.8* 1.6*  AST 204* 128*  ALT 110* 86*  ALKPHOS 648* 529*  BILITOT 9.0* 7.7*  BILIDIR 4.7*  --    PT/INR No results for input(s): "LABPROT", "INR" in the last 72 hours.  Studies/Results: DG Chest Port 1 View  Result Date: 10/03/2022 CLINICAL DATA:  Acute respiratory failure EXAM: PORTABLE CHEST 1 VIEW COMPARISON:  March 03, 2023 FINDINGS: An ETT terminates in the trachea. An NG tube terminates below today's film. A right central line terminates in the SVC. Stable AICD device. No pneumothorax. The lungs are clear. The cardiomediastinal silhouette is stable. IMPRESSION: 1. Support apparatus as above. 2. No acute abnormalities. Electronically Signed   By: Dorise Bullion III M.D.   On: 10/03/2022 10:48   DG Abd 1 View  Result Date: 10/23/2022 CLINICAL DATA:  OG tube placement. EXAM: ABDOMEN - 1 VIEW COMPARISON:  10/21/2022. FINDINGS: Borderline distended loop of small bowel in the mid left abdomen measuring 3.1 cm. An enteric tube terminates in the stomach. Common bile duct stent is unchanged. Vascular calcifications are  noted in the abdomen. IMPRESSION: Enteric tube is appropriate position. Borderline distended loop of small bowel in the abdomen, possible ileus versus partial or early obstruction. Electronically Signed   By: Brett Fairy M.D.   On: 10/19/2022 22:52       Assessment / Plan:    #23 67 year old male with recent diagnosis of ampullary adenocarcinoma  with biliary obstruction, admitted with current biliary obstruction Status post ERCP and stent exchange 10/11/2022.  Patient immediately decompensated with severe sepsis periprocedurally. Cultures positive for Enterobacter rallies and Klebsiella  He has improved over the past couple of days, is now extubated and weaning from pressors Continues on IV cefepime and Flagyl  WBC up a bit today  Bilirubin trending down  #2 severe cardiomyopathy with EF 20 to 25%, in part due to sepsis #3 acute kidney injury on chronic kidney disease-parameters improved  #4 possible ileus versus early obstruction on previous KUB-patient has no complaint of nausea vomiting had bowel movement today  #5 failure to thrive-multifactorial  Plan; start clear liquids with resource supplements, advance as tolerated Abdominal films today Continue IV antibiotics Repeat labs in a.m.    Principal Problem:   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HCC) Active Problems:   Chronic systolic CHF (congestive heart failure) (HCC)   Nonischemic cardiomyopathy (HCC)   Hypokalemia   ICD (implantable cardioverter-defibrillator) in place   Biliary obstruction due to malignant neoplasm (HCC)   Abnormal CT scan, sigmoid colon   Secondary malignancy of biliary tract (HCC)   Elevated CEA   Abnormal LFTs   Severe sepsis with septic shock (HCC)   Palliative care encounter   Failure to thrive in adult   Severe protein-calorie malnutrition (HCC)   HFrEF (heart failure with reduced ejection fraction) (HCC)   Goals of care, counseling/discussion   Counseling and coordination of care   Common bile duct (CBD) stricture     LOS: 5 days   Amy EsterwoodPA-C  10/04/2022, 8:38 AM  I have taken an interval history, thoroughly reviewed the chart and examined the patient. I agree with the Advanced Practitioner's note, impression and recommendations, and have recorded additional findings, impressions and recommendations below. I performed a substantive  portion of this encounter (>50% time spent), including a complete performance of the medical decision making.  (Extensive chart review performed on this medically complex patient, case discussed with my partner Dr. Rush Landmark, multiple data points including but not limited to endoscopic reports reviewed.  Most recent CT abdomen and pelvis images personally reviewed)  My additional thoughts are as follows:  Biliary sepsis slowly clearing.  Patient clinically improved with extubation, improving hemodynamic stability and decreasing pressor requirements, LFTs slowly improving. Mild increase WBC today compared to yesterday, will monitor.  As noted above, patient otherwise clinically improving from a sepsis standpoint. Uncovered metal biliary stent in place after 10/08/2022 ERCP. Pharmacy has changed patient to cefazolin for sensitive Klebsiella  Cholangiocarcinoma, eventual treatment plan per oncology after patient recovers from this acute illness.  Chronic diarrhea of unclear cause.  This was occurring for at least 2 weeks prior to his initial hospitalization in December.  It is of unclear cause, C. difficile negative this admission. I do not think he was on PPI prior to that admission, but his current twice daily pantoprazole does not seem to have a clear indication, so I will discontinue it. He may have some degree of bile acid diarrhea after ERCP with sphincterotomy in December, but again this would not seem to explain the diarrhea that was occurring prehospitalization.  Questionable abnormality sigmoid wall thickening, though difficult to know if that is a real finding versus artifact given the limitations without oral or IV contrast on that scan. Probable sigmoidoscopy later this hospitalization after further period of clinical stability.  Esophageal candidiasis suspected based on 09/28/2022 ERCP findings, fluconazole has been started.  He was able to tolerate a boost supplement today, we will give  him a liquid diet today and, he remains clinically stable and able to swallow with a protected airway, most likely advance to regular diet tomorrow.  Will follow closely   40 minutes were spent on this encounter (including chart review, history/exam, counseling/coordination of care, and documentation) > 50% of that time was spent on counseling and coordination of care.   Nelida Meuse III Office:[phone]

## 2022-10-04 NOTE — Progress Notes (Addendum)
Thankfully, the bilirubin is coming down.  Bilirubin today is 7.7.  SGPT 86.  SGOT 128.  His albumin is only 1.6.  The potassium is 2.9.  His white cell count is 19.  Hemoglobin 9.5.  Platelet count 69,000.  The Klebsiella in his blood is pretty much sensitive to antibiotics outside of ampicillin.  I think is still on some pressor support.  He is extubated which is nice to see.  Again, I really do not believe that this underlying malignancy is going to be what I will allow him to pass on.  I think that is can be as poor cardiac status.  If he does improve and begins to eat and has a better performance status, then the only option I can think of for this malignant ampullary tumor would be radiosurgery.  This is outpatient.  Its about 3-5 treatments.  There is very little toxicity.  I think it would work.  Again, we had a long way to go before we would even consider doing anything like this.  He really needs to improve his performance status.  I think the fact that when I first saw him and his prealbumin was only 6 was a very ominous finding.  At least, he is now extubated.  Hopefully, he will be to get off pressors.  It would be his cardiac status that would dictate his prognosis in my opinion.  His vital signs are stable.  Blood pressure 134/80.  Pulse is 77.  Temperature 98.2.  His lungs sound clear bilaterally.  Cardiac exam regular rate and rhythm.  Abdomen is soft.  There is no fluid wave.  There may be a possibility of Candida esophagitis.  I would go ahead and get him on some Diflucan until we get the biopsy results back.  At this point, we will just have to follow along.  Will be interesting to see how his CEA trends now that the biliary system is open.  I think the real problem is him not eating.  Somehow, he really needs to get nutrition and, if possible.  I do not know if a feeding tube would help or not.  [name], MD  Vonna Kotyk 3:5

## 2022-10-04 NOTE — Evaluation (Signed)
Physical Therapy Evaluation Patient Details Name: [name] [name] MRN: [id number] DOB: 1955-10-28 Today's Date: 10/04/2022  History of Present Illness  [name] [name] is a 67 y.o. male with medical history significant for chronic systolic CHF (EF 81%) s/p ICD, CAD, PAD, HLD, and recently diagnosed ampullary carcinoma with malignant biliary stricture s/p ERCP with biliary stenting (09/17/2022) who presented to the ED1/3/24  from the oncology office for persistent hyperbilirubinemia He .underwent repeat ERCP and stent replacement with findings of purulent discharge, cholangitis.  He is septic post procedure with shock, Intibated 1/5-1/7/.  Clinical Impression  Pt admitted with above diagnosis.  Pt currently with functional limitations due to the deficits listed below (see PT Problem List). Pt will benefit from skilled PT to increase their independence and safety with mobility to allow discharge to the venue listed below.     The patient is resting bed, reports he needs to be cleaned. RN in to assist patient.  Patient rolls and moves to sitting with mod assistance, stands and  pivot steps to recliner with + 2 mod hand hold assistance , slow to rise  and step to recliner.  Patient's HR 98-122, SPO2 on /RA 97%, BP 117/68 after transfer to recliner. Patient  is independent  at  baseline, since  previous admission.     Recommendations for follow up therapy are one component of a multi-disciplinary discharge planning process, led by the attending physician.  Recommendations may be updated based on patient status, additional functional criteria and insurance authorization.  Follow Up Recommendations Skilled nursing-short term rehab (<3 hours/day) Can patient physically be transported by private vehicle: No    Assistance Recommended at Discharge Frequent or constant Supervision/Assistance  Patient can return home with the following  A lot of help with walking and/or transfers;A lot of help with  bathing/dressing/bathroom;Assist for transportation;Help with stairs or ramp for entrance    Equipment Recommendations Rolling walker (2 wheels)  Recommendations for Other Services    OT   Functional Status Assessment Patient has had a recent decline in their functional status and demonstrates the ability to make significant improvements in function in a reasonable and predictable amount of time.     Precautions / Restrictions Precautions Precautions: Fall      Mobility  Bed Mobility Overal bed mobility: Needs Assistance Bed Mobility: Rolling, Sidelying to Sit Rolling: Min assist Sidelying to sit: Mod assist       General bed mobility comments: extra time and  cues to roll to each side, mod assist to raise trunk    Transfers Overall transfer level: Needs assistance Equipment used: 2 person hand held assist Transfers: Sit to/from Stand, Bed to chair/wheelchair/BSC Sit to Stand: +2 physical assistance, +2 safety/equipment   Step pivot transfers: +2 physical assistance, +2 safety/equipment       General transfer comment: extra time  to rise , mod assist to power up, initially trunk flexed. shufflwe steps to to recliner. reaches for  arm rests and turns to sit down    Ambulation/Gait                  Stairs            Wheelchair Mobility    Modified Rankin (Stroke Patients Only)       Balance Overall balance assessment: Needs assistance Sitting-balance support: Feet supported, Bilateral upper extremity supported Sitting balance-Leahy Scale: Fair     Standing balance support: During functional activity, Bilateral upper extremity supported Standing balance-Leahy Scale: Poor  Pertinent Vitals/Pain Pain Assessment Pain Assessment: Faces Faces Pain Scale: Hurts even more Pain Location: L wrist and hand, unable to bear weight    Home Living Family/patient expects to be discharged to:: Private  residence Living Arrangements: Alone Available Help at Discharge: Family;Available PRN/intermittently Type of Home: House Home Access: Stairs to enter   [employer] of Steps: 2   Home Layout: One level Home Equipment: Cane - single point      Prior Function Prior Level of Function : Independent/Modified Independent             Mobility Comments: patient uses cane in community.       Hand Dominance   Dominant Hand: Right    Extremity/Trunk Assessment   Upper Extremity Assessment Upper Extremity Assessment: LUE deficits/detail LUE Deficits / Details: reports pain  in wrist with ROM and WB    Lower Extremity Assessment Lower Extremity Assessment: Generalized weakness    Cervical / Trunk Assessment Cervical / Trunk Assessment: Normal  Communication   Communication: No difficulties  Cognition Arousal/Alertness: Awake/alert Behavior During Therapy: WFL for tasks assessed/performed Overall Cognitive Status: No family/caregiver present to determine baseline cognitive functioning Area of Impairment: Orientation, Following commands, Awareness                 Orientation Level: Situation, Time     Following Commands: Follows one step commands with increased time   Awareness: Emergent   General Comments: repeats that he has a  cane in the room        General Comments      Exercises     Assessment/Plan    PT Assessment Patient needs continued PT services  PT Problem List Decreased strength;Decreased mobility;Decreased knowledge of precautions;Decreased activity tolerance;Cardiopulmonary status limiting activity;Decreased balance;Decreased knowledge of use of DME       PT Treatment Interventions DME instruction;Therapeutic activities;Cognitive remediation;Gait training;Therapeutic exercise;Patient/family education;Functional mobility training    PT Goals (Current goals can be found in the Care Plan section)  Acute Rehab PT Goals Patient  Stated Goal: afreed with OOB PT Goal Formulation: With patient Time For Goal Achievement: 10/18/22 Potential to Achieve Goals: Fair    Frequency Min 2X/week     Co-evaluation               AM-PAC PT "6 Clicks" Mobility  Outcome Measure Help needed turning from your back to your side while in a flat bed without using bedrails?: A Lot Help needed moving from lying on your back to sitting on the side of a flat bed without using bedrails?: A Lot Help needed moving to and from a bed to a chair (including a wheelchair)?: A Lot Help needed standing up from a chair using your arms (e.g., wheelchair or bedside chair)?: A Lot Help needed to walk in hospital room?: Total Help needed climbing 3-5 steps with a railing? : Total 6 Click Score: 10    End of Session   Activity Tolerance: Patient limited by fatigue Patient left: in chair;with call bell/phone within reach;with chair alarm set;with nursing/sitter in room Nurse Communication: Mobility status PT Visit Diagnosis: Unsteadiness on feet (R26.81);Difficulty in walking, not elsewhere classified (R26.2)    Time: 3474-2595 PT Time Calculation (min) (ACUTE ONLY): 21 min   Charges:   PT Evaluation $PT Eval Low Complexity: 1 Low          [place] Office [phone] Weekend [phone]   [name] 10/04/2022, 2:58 PM

## 2022-10-04 NOTE — Progress Notes (Signed)
[facility] ADULT ICU REPLACEMENT PROTOCOL   The patient does apply for the [facility] Adult ICU Electrolyte Replacment Protocol based on the criteria listed below:   1.Exclusion criteria: TCTS, ECMO, Dialysis, and Myasthenia Gravis patients 2. Is GFR >/= 30 ml/min? Yes.    Patient's GFR today is > 60 3. Is SCr </= 2? Yes.   Patient's SCr is 1.01 mg/dL 4. Did SCr increase >/= 0.5 in 24 hours? No 5.Pt's weight >40kg  Yes.   6. Abnormal electrolyte(s): K+ 2.9  7. Electrolytes replaced per protocol 8.  Call MD STAT for K+ </= 2.5, Phos </= 1, or Mag </= 1 Physician:  Dr Marquette Old, Elfredia Nevins 10/04/2022 5:11 AM

## 2022-10-04 NOTE — Progress Notes (Signed)
NAME:  [name] [name], MRN:  [id number], DOB:  [birth year], LOS: 5 ADMISSION DATE:  09/28/2022, CONSULTATION DATE: 10/24/2022 REFERRING MD: Dr. Rush Landmark, CHIEF COMPLAINT: Septic shock  History of Present Illness:   67 year old with a history of nonischemic cardiomyopathy [EF 25%], hypertension, diagnosis of cholangiocarcinoma malignant biliary stricture s/p stenting on 12/22 treated with stent obstruction.  WBC count elevated to 38.7 on 1/5.  He was given a dose of Unasyn.  He underwent repeat ERCP and stent replacement with findings of purulent discharge, cholangitis.  He is septic post procedure with shock and PCCM called for admission to ICU.  Pertinent  Medical History    has a past medical history of AICD (automatic cardioverter/defibrillator) present, 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[place]) (89/16/9450), Chronic systolic CHF (congestive heart failure) ([place]) (01/31/2015), Coronary artery disease, Hypertension, and Nonischemic cardiomyopathy ([place]) (06/30/2015).   Significant Hospital Events: Including procedures, antibiotic start and stop dates in addition to other pertinent events   1/3 Admit 1/5 ERCP, transfer to ICU with septic shock. Made DNR yesterday discussion with family.  Echocardiogram shows EF of less than 20%   Interim History / Subjective:  Extubated to [place]  Hungry  Asks when he can go back home  Objective   Blood pressure (!) 144/97, pulse 73, temperature 98.2 F (36.8 C), resp. rate 19, height '6\' 1"'$  (1.854 m), weight 63.2 kg, SpO2 99 %.    Vent Mode: PSV FiO2 (%):  [40 %] 40 % PEEP:  [5 cmH20] 5 cmH20 Pressure Support:  [5 cmH20] 5 cmH20   Intake/Output Summary (Last 24 hours) at 10/04/2022 0756 Last data filed at 10/04/2022 0654 Gross per 24 hour  Intake 1707.83 ml  Output 1220 ml  Net 487.83 ml   Filed Weights   09/30/22 1818 10/08/2022 0440 10/14/2022 0500  Weight: 58.2 kg 62.6 kg 63.2 kg    Examination: Gen:      No acute distress, chronically ill appearing HEENT:   tracking Lungs:    Clear to auscultation bilaterally; normal respiratory effort CV:         Regular rate and rhythm; no murmurs Abd:      + bowel sounds; soft, non-tender; no palpable masses, no distension Ext:    No edema; adequate peripheral perfusion Skin:      Warm and dry; no rash Neuro: follows commands, attends to conversation  Labs/imaging reviewed K 2.9 S Cr trending down LFTs are improving, lactic acid 1.4 WBC stable  CXR clear lungs  Resolved Hospital Problem list     Assessment & Plan:  Septic shock secondary to cholangitis Biliary obstruction secondary to cholangiocarcinoma Klebsiella bacteremia Post ERCP with stent replacement De-escalate ABX to cover klebsiella Stop vaso, titrate levo for MAP 65  Acute respiratory failure secondary to sepsis PT/OT, mobilize Wean O2 for saturation >92%  Chronic heart failure, nonischemic cardiomyopathy.   EF less than 20%.  Reduction in cardiac function likely due to septic cardiomyopathy Hold outpatient hypertension medications Telemetry Hold diuretic in setting of pressor requirement, third spacing  AKI on chronic kidney disease secondary to shock Monitor urine output and creatinine  Likely ileus Abdomen soft, +BS Repeat kub Correct electrolyte abnormalities Mobilize  Failure to thrive, severe protein calorie malnutrition present on admission As above will check with GI regarding diet but will likely start with clears in setting ileus  Best Practice (right click and "Reselect all SmartList Selections" daily)   Diet/type: NPO - will check with GI about diet, cleared previously for enteral feeds  DVT prophylaxis: prophylactic  heparin  GI prophylaxis: PPI Lines: Central line Foley:  Yes, and it is still needed - will discuss with bedside RN Code Status:  DNR - intubation acceptable however in setting of respiratory failure per current code status Last date of multidisciplinary goals of care discussion '[]'$  Family  discussion on 1/7 with Dr. Hilma Favors  Critical care time:    The patient is critically ill with multiple organ system failure and requires high complexity decision making for assessment and support, frequent evaluation and titration of therapies, advanced monitoring, review of radiographic studies and interpretation of complex data.   Critical Care Time devoted to patient care services, exclusive of separately billable procedures, described in this note is 38 minutes.   [place] for pager   If no response to pager , please call [phone] until 7pm After 7:00 pm call Elink  [phone] 10/04/2022, 7:56 AM

## 2022-10-05 ENCOUNTER — Encounter: Payer: Self-pay | Admitting: *Deleted

## 2022-10-05 ENCOUNTER — Inpatient Hospital Stay (HOSPITAL_COMMUNITY): Payer: Medicare Other

## 2022-10-05 ENCOUNTER — Encounter: Payer: Medicare Other | Admitting: Dietician

## 2022-10-05 DIAGNOSIS — R933 Abnormal findings on diagnostic imaging of other parts of digestive tract: Secondary | ICD-10-CM | POA: Diagnosis not present

## 2022-10-05 DIAGNOSIS — C241 Malignant neoplasm of ampulla of Vater: Secondary | ICD-10-CM | POA: Diagnosis not present

## 2022-10-05 DIAGNOSIS — A419 Sepsis, unspecified organism: Secondary | ICD-10-CM | POA: Diagnosis not present

## 2022-10-05 DIAGNOSIS — K529 Noninfective gastroenteritis and colitis, unspecified: Secondary | ICD-10-CM | POA: Diagnosis not present

## 2022-10-05 DIAGNOSIS — I998 Other disorder of circulatory system: Secondary | ICD-10-CM | POA: Diagnosis not present

## 2022-10-05 DIAGNOSIS — M7989 Other specified soft tissue disorders: Secondary | ICD-10-CM

## 2022-10-05 LAB — GLUCOSE, CAPILLARY
Glucose-Capillary: 146 mg/dL — ABNORMAL HIGH (ref 70–99)
Glucose-Capillary: 158 mg/dL — ABNORMAL HIGH (ref 70–99)
Glucose-Capillary: 172 mg/dL — ABNORMAL HIGH (ref 70–99)
Glucose-Capillary: 178 mg/dL — ABNORMAL HIGH (ref 70–99)
Glucose-Capillary: 188 mg/dL — ABNORMAL HIGH (ref 70–99)
Glucose-Capillary: 228 mg/dL — ABNORMAL HIGH (ref 70–99)

## 2022-10-05 LAB — COMPREHENSIVE METABOLIC PANEL
ALT: 54 U/L — ABNORMAL HIGH (ref 0–44)
AST: 91 U/L — ABNORMAL HIGH (ref 15–41)
Albumin: 1.6 g/dL — ABNORMAL LOW (ref 3.5–5.0)
Alkaline Phosphatase: 462 U/L — ABNORMAL HIGH (ref 38–126)
Anion gap: 9 (ref 5–15)
BUN: 21 mg/dL (ref 8–23)
CO2: 18 mmol/L — ABNORMAL LOW (ref 22–32)
Calcium: 7.7 mg/dL — ABNORMAL LOW (ref 8.9–10.3)
Chloride: 109 mmol/L (ref 98–111)
Creatinine, Ser: 0.93 mg/dL (ref 0.61–1.24)
GFR, Estimated: >60 mL/min (ref >60)
Glucose, Bld: 145 mg/dL — ABNORMAL HIGH (ref 70–99)
Potassium: 3.1 mmol/L — ABNORMAL LOW (ref 3.5–5.1)
Sodium: 136 mmol/L (ref 135–145)
Total Bilirubin: 6.6 mg/dL — ABNORMAL HIGH (ref 0.3–1.2)
Total Protein: 4.7 g/dL — ABNORMAL LOW (ref 6.5–8.1)

## 2022-10-05 LAB — CBC WITH DIFFERENTIAL/PLATELET
Abs Immature Granulocytes: 0.07 10*3/uL (ref 0.00–0.07)
Basophils Absolute: 0 10*3/uL (ref 0.0–0.1)
Basophils Relative: 0 %
Eosinophils Absolute: 0 10*3/uL (ref 0.0–0.5)
Eosinophils Relative: 0 %
HCT: 31.3 % — ABNORMAL LOW (ref 39.0–52.0)
Hemoglobin: 10.7 g/dL — ABNORMAL LOW (ref 13.0–17.0)
Immature Granulocytes: 1 %
Lymphocytes Relative: 9 %
Lymphs Abs: 1.3 10*3/uL (ref 0.7–4.0)
MCH: 32.7 pg (ref 26.0–34.0)
MCHC: 34.2 g/dL (ref 30.0–36.0)
MCV: 95.7 fL (ref 80.0–100.0)
Monocytes Absolute: 0.5 10*3/uL (ref 0.1–1.0)
Monocytes Relative: 3 %
Neutro Abs: 12.5 10*3/uL — ABNORMAL HIGH (ref 1.7–7.7)
Neutrophils Relative %: 87 %
Platelets: 54 10*3/uL — ABNORMAL LOW (ref 150–400)
RBC: 3.27 MIL/uL — ABNORMAL LOW (ref 4.22–5.81)
RDW: 14.7 % (ref 11.5–15.5)
WBC: 14.4 10*3/uL — ABNORMAL HIGH (ref 4.0–10.5)
nRBC: 0.3 % — ABNORMAL HIGH (ref 0.0–0.2)

## 2022-10-05 LAB — TRIGLYCERIDES: Triglycerides: 141 mg/dL (ref <150)

## 2022-10-05 LAB — MAGNESIUM: Magnesium: 1.6 mg/dL — ABNORMAL LOW (ref 1.7–2.4)

## 2022-10-05 MED ORDER — POTASSIUM CHLORIDE 10 MEQ/50ML IV SOLN
10.0000 meq | INTRAVENOUS | Status: AC
  Administered 2022-10-05 (×6): 10 meq via INTRAVENOUS
  Filled 2022-10-05 (×6): qty 50

## 2022-10-05 MED ORDER — MAGNESIUM SULFATE 4 GM/100ML IV SOLN
4.0000 g | Freq: Once | INTRAVENOUS | Status: AC
  Administered 2022-10-05: 4 g via INTRAVENOUS
  Filled 2022-10-05: qty 100

## 2022-10-05 NOTE — Progress Notes (Signed)
[place] Progress Note Patient Name: [name] [name] DOB: Jul 16, 1956 MRN: [id number]   Date of Service  10/05/2022  HPI/Events of Note  Left hand swollen and pt c/o pain/numbness. Nailbeds dusky on left only.  Does have radial pulse. Swelling reportedly started yesterday  Aline has been removed since 1/7 Off pressors since yesterday  eICU Interventions  Ordered stat arterial ultrasound Conferred with bedside CCM team     Intervention Category Intermediate Interventions: Other:  Judd Lien 10/05/2022, 3:29 AM

## 2022-10-05 NOTE — Progress Notes (Addendum)
Patient ID: [name] [name], male   DOB: [birth year], 67 y.o.   MRN: [id number]    Progress Note   Subjective   Day# 6  CC; ampullary carcinoma with biliary obstruction, recent diagnosis status post ERCP and stent placement December 2023 and admitted with recurrent biliary obstruction   status  post ERCP/stent exchange 10/23/2022 abruptly followed by severe sepsis requiring intubation  Enterobacter and Klebsiella on blood cultures Antibiotics narrowed to Ancef IV  Diflucan-candidiasis noted at ERCP  Extubated and weaning pressors   today-WBC 14.4/hemoglobin 10.7/hematocrit 31.3/platelets 54 Potassium 3.1/BUN 21/creatinine 0.93 Bili 6.6/alk phos 462/AST 91/ALT 54 improved  Pt says he is better, chatting on his phone.  He was advanced to solid food today eating without difficulty denies any nausea or vomiting.  No complaints of abdominal pain. No diarrhea today, he said he had been having diarrhea prior to admission but that has slowed "way down".      Objective   Vital signs in last 24 hours: Temp:  [96.4 F (35.8 C)-98.1 F (36.7 C)] 97.9 F (36.6 C) (01/09 0800) Pulse Rate:  [61-101] 88 (01/09 0921) Resp:  [12-23] 13 (01/09 0600) BP: (99-167)/(47-103) 125/75 (01/09 0600) SpO2:  [97 %-100 %] 99 % (01/09 0600) Last BM Date : 10/04/22 General:    Older African-American male in NAD Heart:  Regular rate and rhythm; no murmurs Lungs: Respirations even and unlabored, lungs CTA bilaterally Abdomen:  Soft, nontender and nondistended. Normal bowel sounds. Extremities:  Without edema. Neurologic:  Alert and oriented,  grossly normal neurologically. Psych:  Cooperative. Normal mood and affect.  Intake/Output from previous day: 01/08 0701 - 01/09 0700 In: 1561.3 [I.V.:574.8; IV Piggyback:986.5] Out: 700 [Urine:700] Intake/Output this shift: Total I/O In: 265 [I.V.:62; IV Piggyback:203] Out: -   Lab Results: Recent Labs    10/03/22 0352 10/04/22 0404 10/05/22 0201  WBC  17.5* 19.0* 14.4*  HGB 9.6* 9.5* 10.7*  HCT 27.2* 27.0* 31.3*  PLT PLATELET CLUMPS NOTED ON SMEAR, UNABLE TO ESTIMATE 69* 54*   BMET Recent Labs    10/03/22 0352 10/04/22 0404 10/05/22 0201  NA 134* 135 136  K 3.2* 2.9* 3.1*  CL 108 107 109  CO2 17* 20* 18*  GLUCOSE 157* 166* 145*  BUN 33* 26* 21  CREATININE 1.28* 1.01 0.93  CALCIUM 7.1* 7.6* 7.7*   LFT Recent Labs    10/03/22 0352 10/04/22 0404 10/05/22 0201  PROT 4.8*   < > 4.7*  ALBUMIN 1.8*   < > 1.6*  AST 204*   < > 91*  ALT 110*   < > 54*  ALKPHOS 648*   < > 462*  BILITOT 9.0*   < > 6.6*  BILIDIR 4.7*  --   --    < > = values in this interval not displayed.   PT/INR No results for input(s): "LABPROT", "INR" in the last 72 hours.       Assessment / Plan:    #110 67 year old male with recent diagnosis of ampullary adenocarcinoma with bili obstruction admitted with recurrent biliary obstruction.  Initial stent had been placed December 2023.  He underwent ERCP and stent exchange with uncovered stent on 10/26/2022 then immediately decompensated with severe sepsis  Bacteremic with cultures positive for Enterobacter and Klebsiella Antibiotics now narrowed to Ancef  He has been weaned off pressors  Parameters all improving, continued improvement in LFTs  Eventual treatment plan per oncology outpatient  #2 severe cardiomyopathy with EF of 20 to 25% #3 acute kidney injury on  chronic kidney disease-parameters improved  #4 chronic diarrhea etiology not clear-had started a few weeks prior to admission question if this was related to previous antibiotics, versus bile acid induced. Nevertheless he has had significant improvement this admission and not having any current issues with diarrhea Consider sigmoidoscopy at some point prior to discharge-actionable abnormal sigmoid wall thickening on prior CT was done without oral or IV contrast.  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[place]) Active Problems:   Chronic systolic CHF (congestive heart failure) (HCC)   Nonischemic cardiomyopathy (HCC)   Hypokalemia   ICD (implantable cardioverter-defibrillator) in place   Biliary obstruction due to malignant neoplasm (HCC)   Abnormal CT scan, colon   Secondary malignancy of biliary tract (HCC)   Elevated CEA   Abnormal LFTs   Severe sepsis with septic shock (HCC)   Palliative care encounter   Failure to thrive in adult   Severe protein-calorie malnutrition (HCC)   HFrEF (heart failure with reduced ejection fraction) ([place])   Goals of care, counseling/discussion   Counseling and coordination of care   Common bile duct (CBD) stricture   Chronic diarrhea   Esophageal candidiasis ([place])     LOS: 6 days   Amy Esterwood PA-C 10/05/2022, 11:10 AM  I have taken an interval history, thoroughly reviewed the chart and examined the patient. I agree with the Advanced Practitioner's note, impression and recommendations, and have recorded additional findings, impressions and recommendations below. I performed a substantive portion of this encounter (>50% time spent), including a complete performance of the medical decision making.  My additional thoughts are as follows:  Biliary sepsis under control with slowly declining WBC LFTs, improved hemodynamics and cessation of pressors.  His mental status is significantly improved and he is able to take oral nutrition today.  Diarrhea reportedly occurring since prior to his diagnosis in December, unclear if CT scan findings real artifact (see note yesterday). We are still planning a sigmoidoscopy prior to discharge to rule out any problems there, perhaps a day after tomorrow if he continues on his current trend of improvement.  We will see him tomorrow and discuss that with him and his family to make a final decision.   Nelida Meuse III Office:[phone]

## 2022-10-05 NOTE — Progress Notes (Signed)
Left upper extremity venous duplex and left upper arterial duplex has been completed. Preliminary results can be found in CV Proc through chart review.  Results were given to the patient's nurse, Elta Guadeloupe.  10/05/22 11:07 AM [name] [name] RVT

## 2022-10-05 NOTE — Progress Notes (Addendum)
NAME:  [name] [name], MRN:  [id number], DOB:  08/16/1956, LOS: 46 ADMISSION DATE:  09/27/2022, CONSULTATION DATE: 10/25/2022 REFERRING MD: Dr. Rush Landmark, CHIEF COMPLAINT: Septic shock  History of Present Illness:   67 year old with a history of nonischemic cardiomyopathy [EF 25%], hypertension, diagnosis of cholangiocarcinoma malignant biliary stricture s/p stenting on 12/22 treated with stent obstruction.  WBC count elevated to 38.7 on 1/5.  He was given a dose of Unasyn.  He underwent repeat ERCP and stent replacement with findings of purulent discharge, cholangitis.  He is septic post procedure with shock and PCCM called for admission to ICU.  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   Significant Hospital Events: Including procedures, antibiotic start and stop dates in addition to other pertinent events   1/3 Admit 1/5 ERCP, transfer to ICU with septic shock. Made DNR yesterday discussion with family.  Echocardiogram shows EF of less than 20%   Interim History / Subjective:  Swelling LUE, arterial duplex ordered overnight, venous duplex ordered this AM.   Tolerated clear liquids, boost.   Weaned off pressors  Objective   Blood pressure 125/75, pulse 80, temperature 97.6 F (36.4 C), temperature source Oral, resp. rate 13, height '6\' 1"'$  (1.854 m), weight 63.2 kg, SpO2 99 %.        Intake/Output Summary (Last 24 hours) at 10/05/2022 0753 Last data filed at 10/05/2022 0657 Gross per 24 hour  Intake 1561.31 ml  Output 700 ml  Net 861.31 ml   Filed Weights   09/30/22 1818 10/20/2022 0440 10/16/2022 0500  Weight: 58.2 kg 62.6 kg 63.2 kg    Examination: Gen:      No acute distress, chronically ill appearing HEENT:   tracking Lungs:    Clear to auscultation bilaterally; normal respiratory effort CV:         Regular rate and rhythm; no murmurs Abd:      + bowel sounds; soft, non-tender; no palpable masses, no distension Ext:    Left hand warm, swollen, taut, with bullae, left arm swelling. Able to flex fingers.  Skin:      Warm and dry; no rash Neuro: follows commands, attends to conversation  Labs/imaging reviewed K 2.9 S Cr trending down LFTs are improving WBC trending down PLT 54    Resolved Hospital Problem list     Assessment & Plan:  Septic shock secondary to cholangitis, shock resolved Biliary obstruction secondary to cholangiocarcinoma Klebsiella bacteremia Post ERCP with stent replacement Ancef at least through 1/10 which will provide coverage through 5 days after source control  Acute respiratory failure secondary to sepsis PT/OT, mobilize Wean O2 for saturation >92%  Chronic heart failure, nonischemic cardiomyopathy.   EF less than 20%.  Reduction in cardiac function likely due to septic cardiomyopathy Hold outpatient hypertension medications Telemetry Hold diuretic in setting of pressor requirement, third spacing, currently on room air  LUE hand/arm swelling US DVT/arterial duplex Elevate, warm compresses If remains this taut or worsens then consideration of hand surgery consult  Esophageal candidiasis Fluconazole 1/8-  TCP Possibly developing in setting acute UE DVT vs drug related vs sepsis. Hasn't had heparin exposure here. Trend CBC F/u UE DVT US   AKI on chronic kidney disease secondary to shock Monitor urine output and creatinine  Likely ileus Abdomen soft, +BS Correct electrolyte abnormalities Mobilize  Failure to thrive, severe protein calorie malnutrition present on admission Advance to full diet  Best Practice (right click and "Reselect all SmartList Selections" daily)   Diet/type: NPO - advance to regular DVT prophylaxis: SCD in setting  TCP GI prophylaxis: PPI Lines: Central line - will plan to place additional IV and remove TLC Foley:  N/A  Code Status:  DNR - intubation acceptable however in setting of respiratory failure per current code status Last date of multidisciplinary goals of care discussion '[]'$  Family discussion on 1/7 with Dr. Hilma Favors. Will update today.  Critical care time:    The patient is critically ill with multiple organ system failure and requires high complexity decision making for assessment and support, frequent evaluation and titration of therapies, advanced monitoring, review of radiographic studies and interpretation of complex data.   Critical Care Time devoted to patient care services, exclusive of separately billable procedures, described in this note is 40 minutes.   [place] for pager   If no response to pager , please call [phone] until 7pm After 7:00 pm call Elink  [phone] 10/05/2022, 7:53 AM

## 2022-10-05 NOTE — Progress Notes (Signed)
[facility] ADULT ICU REPLACEMENT PROTOCOL   The patient does apply for the [facility] Adult ICU Electrolyte Replacment Protocol based on the criteria listed below:   1.Exclusion criteria: TCTS, ECMO, Dialysis, and Myasthenia Gravis patients 2. Is GFR >/= 30 ml/min? Yes.    Patient's GFR today is >60 3. Is SCr </= 2? Yes.   Patient's SCr is 0.93 mg/dL 4. Did SCr increase >/= 0.5 in 24 hours? No. 5.Pt's weight >40kg  Yes.   6. Abnormal electrolyte(s): K 3.1, Mag 1.6  7. Electrolytes replaced per protocol 8.  Call MD STAT for K+ </= 2.5, Phos </= 1, or Mag </= 1 Physician:    Ronda Fairly A 10/05/2022 2:56 AM

## 2022-10-05 NOTE — Consult Note (Signed)
Hospital Consult    Reason for Consult:  occluded left radial artery Referring Physician:  Dr. Verlee Monte MRN #:  [id number]  History of Present Illness: 67 y.o. male with history of hypertension recently diagnosed with ambulatory carcinoma and admitted with sepsis status post biliary stent placement and patient was recently intubated on pressors now extubated and off of pressors.  He had a radial A-line placed that was reportedly removed on the seventh.  Since awakening from procedure he states that he has had left hand pain and per report this has worsened in coloration over the last 2 days with significant swelling in the hand.  He also has thrombocytopenia of unknown etiology.  Patient denies any previous vascular surgery does have a history of cardiac catheterization and defibrillator placement.  He is prescribed aspirin at home.  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   Chronic systolic CHF (congestive heart failure) ([place]) 01/31/2015   Coronary artery disease    Hypertension    Nonischemic cardiomyopathy ([place]) 06/30/2015    Past Surgical History:  Procedure Laterality Date   BILIARY BRUSHING  09/17/2022   Procedure: BILIARY BRUSHING;  Surgeon: Jackquline Denmark, MD;  Location: [place];  Service: Gastroenterology;;   BILIARY STENT PLACEMENT  09/17/2022   Procedure: BILIARY STENT PLACEMENT;  Surgeon: Jackquline Denmark, MD;  Location: [place];  Service: Gastroenterology;;   BILIARY STENT PLACEMENT N/A 09/27/2022   Procedure: BILIARY STENT PLACEMENT;  Surgeon: Irving Copas., MD;  Location: [facility];  Service: Gastroenterology;  Laterality: N/A;   CARDIAC CATHETERIZATION N/A 02/04/2015   Procedure: Right/Left Heart Cath and Coronary Angiography;  Surgeon: Larey Dresser, MD;  Location: [place] [facility];  Service: Cardiovascular;  Laterality: N/A;   CARDIAC DEFIBRILLATOR PLACEMENT   07/09/2015   CHALAZION EXCISION Left 01/16/2014   Procedure: MINOR EXCISION OF CHALAZION;  Surgeon: Myrtha Mantis., MD;  Location: [place];  Service: Ophthalmology;  Laterality: Left;   EP IMPLANTABLE DEVICE N/A 07/09/2015   Procedure: ICD Implant;  Surgeon: Deboraha Sprang, MD;  Location: [place] [facility];  Service: Cardiovascular;  Laterality: N/A;   ERCP N/A 09/17/2022   Procedure: ENDOSCOPIC RETROGRADE CHOLANGIOPANCREATOGRAPHY (ERCP);  Surgeon: Jackquline Denmark, MD;  Location: [facility];  Service: Gastroenterology;  Laterality: N/A;   ERCP N/A 10/26/2022   Procedure: ENDOSCOPIC RETROGRADE CHOLANGIOPANCREATOGRAPHY (ERCP);  Surgeon: Irving Copas., MD;  Location: [facility];  Service: Gastroenterology;  Laterality: N/A;   ESOPHAGEAL BRUSHING  10/27/2022   Procedure: ESOPHAGEAL BRUSHING;  Surgeon: Rush Landmark Telford Nab., MD;  Location: [facility];  Service: Gastroenterology;;   EYE SURGERY Left ~ 2013   "eye infection"   REMOVAL OF STONES  10/22/2022   Procedure: REMOVAL OF STONES;  Surgeon: Irving Copas., MD;  Location: [facility];  Service: Gastroenterology;;   Joan Mayans  09/17/2022   Procedure: Joan Mayans;  Surgeon: Jackquline Denmark, MD;  Location: [place];  Service: Gastroenterology;;   Lavell Islam REMOVAL  10/10/2022   Procedure: STENT REMOVAL;  Surgeon: Irving Copas., MD;  Location: [facility];  Service: Gastroenterology;;    No Known Allergies  Prior to Admission medications   Medication Sig Start Date End Date Taking? Authorizing Provider  aspirin 81 MG EC tablet TAKE 1 TABLET(81 MG) BY MOUTH DAILY. SWALLOW WHOLE Patient taking differently: Take 81 mg by mouth daily. TAKE 1 TABLET(81 MG) BY MOUTH DAILY. SWALLOW WHOLE 11/04/21  Yes Larey Dresser, MD  carvedilol (COREG) 3.125 MG tablet Take 1 tablet (3.125 mg total) by mouth 2 (two) times daily with a meal. 09/19/22  Yes Thurnell Lose, MD  ezetimibe (ZETIA) 10 MG  tablet TAKE 1 TABLET(10 MG) BY MOUTH DAILY 11/04/21  Yes Larey Dresser, MD  isosorbide mononitrate (IMDUR) 30 MG 24 hr tablet Take 0.5 tablets (15 mg total) by mouth daily. 09/20/22  Yes Thurnell Lose, MD  pantoprazole (PROTONIX) 40 MG tablet Take 1 tablet (40 mg total) by mouth daily. Patient taking differently: Take 40 mg by mouth daily as needed (indigestion). 09/20/22  Yes Thurnell Lose, MD  rosuvastatin (CRESTOR) 40 MG tablet TAKE 1 TABLET(40 MG) BY MOUTH DAILY 11/04/21  Yes Larey Dresser, MD  dapagliflozin propanediol (FARXIGA) 10 MG TABS tablet Take 1 tablet (10 mg total) by mouth daily before breakfast. Patient not taking: Reported on 10/08/2022 11/04/21   Larey Dresser, MD  digoxin (LANOXIN) 0.125 MG tablet Take 1 tablet (0.125 mg total) by mouth daily. Patient not taking: Reported on 10/16/2022 11/04/21   Larey Dresser, MD  triamcinolone ointment (KENALOG) 0.5 % Apply 1 application topically 2 (two) times daily. Patient not taking: Reported on 10/26/2022 07/11/18   Clent Demark, PA-C    Social History   Socioeconomic History   Marital status: Single    Spouse name: Not on file   Number of children: Not on file   Years of education: Not on file   Highest education level: Not on file  Occupational History   Not on file  Tobacco Use   Smoking status: Every Day    Packs/day: 1.00    Years: 40.00    Total pack years: 40.00    Types: Cigarettes   Smokeless tobacco: Never  Vaping Use   Vaping Use: Every day  Substance and Sexual Activity   Alcohol use: Not Currently    Comment: 07/09/2015 "I haven't had a drink since last of 12/2014"   Drug use: Not Currently    Types: "Crack" cocaine, Marijuana    Comment: 07/09/2015 "I did drugs a long time;ago  quit in the mid 1980's"   Sexual activity: Not Currently  Other Topics Concern   Not on file  Social History Narrative   Not on file   Social Determinants of Health   Financial Resource Strain: Not on file  Food  Insecurity: Food Insecurity Present (09/30/2022)   Hunger Vital Sign    Worried About Running Out of Food in the Last Year: Never true    Ran Out of Food in the Last Year: Sometimes true  Transportation Needs: No Transportation Needs (09/30/2022)   PRAPARE - Hydrologist (Medical): No    Lack of Transportation (Non-Medical): No  Physical Activity: Not on file  Stress: Not on file  Social Connections: Not on file  Intimate Partner Violence: Not At Risk (09/30/2022)   Humiliation, Afraid, Rape, and Kick questionnaire    Fear of Current or Ex-Partner: No    Emotionally Abused: No    Physically Abused: No    Sexually Abused: No     Family History  Problem Relation Age of Onset   Stroke Mother    Heart Problems Other     Review of Systems  Constitutional:  Positive for malaise/fatigue.  HENT: Negative.    Eyes: Negative.   Respiratory: Negative.    Cardiovascular: Negative.   Musculoskeletal:        Left hand pain  Skin: Negative.   Neurological: Negative.   Endo/Heme/Allergies: Negative.   Psychiatric/Behavioral: Negative.        Physical Examination  Vitals:   10/05/22 1200 10/05/22 1600  BP:    Pulse:    Resp:    Temp: (!) 97.4 F (36.3 C) (!) 97.5 F (36.4 C)  SpO2:     Body mass index is 18.38 kg/m.  Physical Exam Constitutional:      Appearance: He is ill-appearing.  HENT:     Nose: Nose normal.     Mouth/Throat:     Mouth: Mucous membranes are moist.  Eyes:     Pupils: Pupils are equal, round, and reactive to light.  Cardiovascular:     Rate and Rhythm: Bradycardia present.     Pulses:          Femoral pulses are 1+ on the right side and 1+ on the left side.    Comments: Right radial and ulnar signals are dopplerable Left radial signal venous and monophasic ulnar signal with very weak monophasic palmar arch signal Pulmonary:     Effort: Pulmonary effort is normal.  Abdominal:     General: Abdomen is flat.      Palpations: Abdomen is soft.  Musculoskeletal:     Cervical back: Normal range of motion.     Comments: Left hand pictured below  Neurological:     Mental Status: He is alert.      CBC    Component Value Date/Time   WBC 14.4 (H) 10/05/2022 0201   RBC 3.27 (L) 10/05/2022 0201   HGB 10.7 (L) 10/05/2022 0201   HGB 11.5 (L) 10/04/2022 1107   HCT 31.3 (L) 10/05/2022 0201   PLT 54 (L) 10/05/2022 0201   PLT 265 10/07/2022 1107   MCV 95.7 10/05/2022 0201   MCH 32.7 10/05/2022 0201   MCHC 34.2 10/05/2022 0201   RDW 14.7 10/05/2022 0201   LYMPHSABS 1.3 10/05/2022 0201   MONOABS 0.5 10/05/2022 0201   EOSABS 0.0 10/05/2022 0201   BASOSABS 0.0 10/05/2022 0201    BMET    Component Value Date/Time   NA 136 10/05/2022 0201   K 3.1 (L) 10/05/2022 0201   CL 109 10/05/2022 0201   CO2 18 (L) 10/05/2022 0201   GLUCOSE 145 (H) 10/05/2022 0201   BUN 21 10/05/2022 0201   CREATININE 0.93 10/05/2022 0201   CREATININE 1.07 10/23/2022 1107   CREATININE 0.95 06/30/2015 1711   CALCIUM 7.7 (L) 10/05/2022 0201   GFRNONAA >60 10/05/2022 0201   GFRNONAA >60 09/28/2022 1107   GFRNONAA 88 01/27/2015 1605   GFRAA >60 05/06/2020 0933   GFRAA >89 01/27/2015 1605    COAGS: Lab Results  Component Value Date   INR 1.3 (H) 09/30/2022   INR 1.2 09/18/2022   INR 2.2 (H) 09/16/2022     Non-Invasive Vascular Imaging:   Left Arterial Doppler Findings:  +--------------+----------+---------+--------+--------+  Site         PSV (cm/s)Waveform StenosisComments  +--------------+----------+---------+--------+--------+  Subclavian Mid54        triphasic                  +--------------+----------+---------+--------+--------+  Axillary     37        triphasic                  +--------------+----------+---------+--------+--------+  Brachial Prox 39        biphasic                   +--------------+----------+---------+--------+--------+  Radial Prox   0                   occluded          +--------------+----------+---------+--------+--------+  Radial Mid    0                  occluded          +--------------+----------+---------+--------+--------+  Radial Dist   0                  occluded          +--------------+----------+---------+--------+--------+  Ulnar Prox    46        biphasic                   +--------------+----------+---------+--------+--------+  Ulnar Mid     36        biphasic                   +--------------+----------+---------+--------+--------+  Ulnar Dist    32        biphasic                   +--------------+----------+---------+--------+--------+   The radial and ulnar arteries bifurcate in the proximal upper arm.     Summary:    Left: Obstruction noted in the radial artery.   Right Findings:  +----------+------------+---------+-----------+----------+-------+  RIGHT    CompressiblePhasicitySpontaneousPropertiesSummary  +----------+------------+---------+-----------+----------+-------+  Subclavian   Full       Yes       Yes                       +----------+------------+---------+-----------+----------+-------+     Left Venous Findings:  +----------+------------+---------+-----------+----------+-------+  LEFT     CompressiblePhasicitySpontaneousPropertiesSummary  +----------+------------+---------+-----------+----------+-------+  IJV          Full       Yes       Yes                       +----------+------------+---------+-----------+----------+-------+  Subclavian   Full       Yes       Yes                       +----------+------------+---------+-----------+----------+-------+  Axillary     Full       Yes       Yes                       +----------+------------+---------+-----------+----------+-------+  Brachial     Full       Yes       Yes                       +----------+------------+---------+-----------+----------+-------+   Radial       Full                                           +----------+------------+---------+-----------+----------+-------+  Ulnar        Full                                           +----------+------------+---------+-----------+----------+-------+  Cephalic     Full                                           +----------+------------+---------+-----------+----------+-------+  Basilic      None                                   Acute   +----------+------------+---------+-----------+----------+-------+     Summary:    Right:  No evidence of thrombosis in the subclavian.    Left:  No evidence of deep vein thrombosis in the upper extremity. Findings  consistent  with acute superficial vein thrombosis involving the left basilic vein.      ASSESSMENT/PLAN: This is a 67 y.o. male with what appears to be an ischemic left hand status post radial artery cannulation for hemodynamic monitoring now with occluded radial artery by recent duplex and very weak flow into the hand by ulnar artery.  This is being ongoing for a few days at this point.  Unfortunately patient with decreasing platelets per primary team not a candidate for anticoagulation at this time.  Will plan for angiography from femoral approach tomorrow in the Cath Lab at [facility].  He will be scheduled for the last case the day and will need to be transferred over for this procedure and can be sent back afterwards as long as he is stable for transfer.  I discussed with the patient and his daughter at the bedside that he is at high risk of digit loss with or without revascularization possibly could lose more than just his digits up to and including his hand.  Will likely require hand surgery involvement in his case.    Patient can eat breakfast and be n.p.o. past that given that his case is later in the day.  Nikalas Bramel C. Donzetta Matters, MD [facility] Office: [phone] Pager:  [phone]

## 2022-10-05 NOTE — Progress Notes (Addendum)
Actually, I do think that [name] looks a little bit better.  He sounds stronger.  I think he is off pressor support now.  His bilirubin is slowly coming down.  It is now 6.6.  His albumin is 1.6.  His BUN is 21 creatinine 0.93.  His potassium is 3.1.  His CBC shows white count of 14.4.  Hemoglobin 10.7.  Platelet count is low at 54,000.  I am unsure as to why the platelet count is dropping outside of medications.  He is not bleeding.  He does have swelling in the left arm.  We may need to get Doppler.  I think he might be eating a little bit better now.  This I think will be the biggest determinant as to his prognosis.  He has not complained of any pain.  I see that the IgM Hepatitis B core antibody is reactive.  Again I am not sure what this means.  His vital signs are pretty stable.  Blood pressure 125/75.  Temperature is 97.6.  Pulse is 80.  Overall, there is really no change in his physical exam.  There is no abdominal distention.  There is no abdominal pain.  He has good bowel sounds.  His lungs are clear bilaterally.  Cardiac exam regular rate and rhythm with occasional extra beat.  Extremities does show a little bit of swelling in the left hand.  Neurological exam does not show any obvious deficits.  For right now, I am just happy that he is improving.  I think this infection was a big part of why he was so sick.  This is improving.  The biliary blockage is also getting better.  He still has a long way to go before we could consider any intervention for this ampullary type of malignancy.  At least, he is making some progress.  I am not sure as to why the platelet count is dropping.  We will have to watch this closely.  I know that he is getting incredible care from everybody down in the ICU.  [name], MD  Psalms [phone]

## 2022-10-05 NOTE — Progress Notes (Signed)
Patient continues to be admitted. He developed sepsis and was intubated. He has since been extubated and making slow improvements. At this time he is not appropriate for treatment, however if he continues to improve, radiosurgery may be possible.   Will continue to follow for post discharge needs and office follow up.   Oncology Nurse Navigator Documentation     10/05/2022   10:00 AM  Oncology Nurse Navigator Flowsheets  Navigator Follow Up Date: 10/11/2022  Navigator Follow Up Reason: Appointment Review  Navigator Location [facility]  Navigator Encounter Type Appt/Treatment Plan Review  Patient Visit Type MedOnc  Treatment Phase Pre-Tx/Tx Discussion  Barriers/Navigation Needs Coordination of Care;Education  Interventions Coordination of Care  Acuity Level 2-Minimal Needs (1-2 Barriers Identified)  Coordination of Care Other  Support Groups/Services Friends and Family  Time Spent with Patient 15

## 2022-10-06 ENCOUNTER — Encounter (HOSPITAL_COMMUNITY): Admission: EM | Disposition: E | Payer: Self-pay | Source: Home / Self Care | Attending: Internal Medicine

## 2022-10-06 ENCOUNTER — Encounter: Payer: Self-pay | Admitting: Gastroenterology

## 2022-10-06 DIAGNOSIS — K831 Obstruction of bile duct: Secondary | ICD-10-CM | POA: Diagnosis not present

## 2022-10-06 DIAGNOSIS — C7889 Secondary malignant neoplasm of other digestive organs: Secondary | ICD-10-CM

## 2022-10-06 DIAGNOSIS — R7989 Other specified abnormal findings of blood chemistry: Secondary | ICD-10-CM | POA: Diagnosis not present

## 2022-10-06 DIAGNOSIS — C241 Malignant neoplasm of ampulla of Vater: Secondary | ICD-10-CM | POA: Diagnosis not present

## 2022-10-06 DIAGNOSIS — R627 Adult failure to thrive: Secondary | ICD-10-CM | POA: Diagnosis not present

## 2022-10-06 DIAGNOSIS — I998 Other disorder of circulatory system: Secondary | ICD-10-CM

## 2022-10-06 HISTORY — PX: UPPER EXTREMITY ANGIOGRAPHY: CATH118270

## 2022-10-06 HISTORY — PX: AORTIC ARCH ANGIOGRAPHY: CATH118224

## 2022-10-06 LAB — TECHNOLOGIST SMEAR REVIEW: Plt Morphology: NORMAL

## 2022-10-06 LAB — PHOSPHORUS
Phosphorus: 1.1 mg/dL — ABNORMAL LOW (ref 2.5–4.6)
Phosphorus: 2.4 mg/dL — ABNORMAL LOW (ref 2.5–4.6)

## 2022-10-06 LAB — CBC
HCT: 30.6 % — ABNORMAL LOW (ref 39.0–52.0)
Hemoglobin: 10.4 g/dL — ABNORMAL LOW (ref 13.0–17.0)
MCH: 33 pg (ref 26.0–34.0)
MCHC: 34 g/dL (ref 30.0–36.0)
MCV: 97.1 fL (ref 80.0–100.0)
Platelets: 62 10*3/uL — ABNORMAL LOW (ref 150–400)
RBC: 3.15 MIL/uL — ABNORMAL LOW (ref 4.22–5.81)
RDW: 14.7 % (ref 11.5–15.5)
WBC: 12.2 10*3/uL — ABNORMAL HIGH (ref 4.0–10.5)
nRBC: 0.5 % — ABNORMAL HIGH (ref 0.0–0.2)

## 2022-10-06 LAB — MAGNESIUM: Magnesium: 1.9 mg/dL (ref 1.7–2.4)

## 2022-10-06 LAB — GLUCOSE, CAPILLARY
Glucose-Capillary: 171 mg/dL — ABNORMAL HIGH (ref 70–99)
Glucose-Capillary: 171 mg/dL — ABNORMAL HIGH (ref 70–99)
Glucose-Capillary: 224 mg/dL — ABNORMAL HIGH (ref 70–99)
Glucose-Capillary: 151 mg/dL — ABNORMAL HIGH (ref 70–99)
Glucose-Capillary: 221 mg/dL — ABNORMAL HIGH (ref 70–99)

## 2022-10-06 LAB — BASIC METABOLIC PANEL
Anion gap: 9 (ref 5–15)
BUN: 17 mg/dL (ref 8–23)
CO2: 18 mmol/L — ABNORMAL LOW (ref 22–32)
Calcium: 7.4 mg/dL — ABNORMAL LOW (ref 8.9–10.3)
Chloride: 107 mmol/L (ref 98–111)
Creatinine, Ser: 0.9 mg/dL (ref 0.61–1.24)
GFR, Estimated: >60 mL/min (ref >60)
Glucose, Bld: 153 mg/dL — ABNORMAL HIGH (ref 70–99)
Potassium: 3.3 mmol/L — ABNORMAL LOW (ref 3.5–5.1)
Sodium: 134 mmol/L — ABNORMAL LOW (ref 135–145)

## 2022-10-06 SURGERY — AORTIC ARCH ANGIOGRAPHY
Anesthesia: LOCAL

## 2022-10-06 MED ORDER — FENTANYL CITRATE (PF) 100 MCG/2ML IJ SOLN
INTRAMUSCULAR | Status: DC | PRN
  Administered 2022-10-06: 25 ug via INTRAVENOUS

## 2022-10-06 MED ORDER — ONDANSETRON HCL 4 MG/2ML IJ SOLN
4.0000 mg | Freq: Four times a day (QID) | INTRAMUSCULAR | Status: DC | PRN
  Administered 2022-10-09: 4 mg via INTRAVENOUS
  Filled 2022-10-06: qty 2

## 2022-10-06 MED ORDER — HEPARIN SODIUM (PORCINE) 1000 UNIT/ML IJ SOLN
INTRAMUSCULAR | Status: DC | PRN
  Administered 2022-10-06: 2000 [IU] via INTRAVENOUS

## 2022-10-06 MED ORDER — HEPARIN SODIUM (PORCINE) 1000 UNIT/ML IJ SOLN
INTRAMUSCULAR | Status: AC
  Filled 2022-10-06: qty 10

## 2022-10-06 MED ORDER — MIDAZOLAM HCL 2 MG/2ML IJ SOLN
INTRAMUSCULAR | Status: DC | PRN
  Administered 2022-10-06: .5 mg via INTRAVENOUS

## 2022-10-06 MED ORDER — HEPARIN (PORCINE) IN NACL 1000-0.9 UT/500ML-% IV SOLN
INTRAVENOUS | Status: DC | PRN
  Administered 2022-10-06 (×2): 500 mL

## 2022-10-06 MED ORDER — SODIUM CHLORIDE 0.9 % WEIGHT BASED INFUSION
1.0000 mL/kg/h | INTRAVENOUS | Status: AC
  Administered 2022-10-06: 1 mL/kg/h via INTRAVENOUS

## 2022-10-06 MED ORDER — SODIUM CHLORIDE 0.9% FLUSH: 3.0000 mL | INTRAVENOUS | Status: DC | PRN

## 2022-10-06 MED ORDER — SODIUM CHLORIDE 0.9% FLUSH: 3.0000 mL | Freq: Two times a day (BID) | INTRAVENOUS | Status: DC

## 2022-10-06 MED ORDER — GUAIFENESIN-DM 100-10 MG/5ML PO SYRP
5.0000 mL | ORAL_SOLUTION | ORAL | Status: DC | PRN
  Administered 2022-10-06 – 2022-10-09 (×5): 5 mL via ORAL
  Filled 2022-10-06 (×6): qty 10

## 2022-10-06 MED ORDER — LABETALOL HCL 5 MG/ML IV SOLN
10.0000 mg | INTRAVENOUS | Status: DC | PRN
  Administered 2022-10-10 – 2022-10-12 (×3): 10 mg via INTRAVENOUS
  Filled 2022-10-06 (×4): qty 4

## 2022-10-06 MED ORDER — SODIUM CHLORIDE 0.9 % IV SOLN
250.0000 mL | INTRAVENOUS | Status: DC | PRN
  Administered 2022-10-07 – 2022-10-11 (×2): 250 mL via INTRAVENOUS

## 2022-10-06 MED ORDER — IODIXANOL 320 MG/ML IV SOLN
INTRAVENOUS | Status: DC | PRN
  Administered 2022-10-06: 115 mL

## 2022-10-06 MED ORDER — HEPARIN (PORCINE) 25000 UT/250ML-% IV SOLN
1000.0000 [IU]/h | INTRAVENOUS | Status: DC
  Administered 2022-10-06: 1000 [IU]/h via INTRAVENOUS
  Filled 2022-10-06: qty 250

## 2022-10-06 MED ORDER — FENTANYL CITRATE (PF) 100 MCG/2ML IJ SOLN
INTRAMUSCULAR | Status: AC
  Filled 2022-10-06: qty 2

## 2022-10-06 MED ORDER — MIDAZOLAM HCL 2 MG/2ML IJ SOLN
INTRAMUSCULAR | Status: AC
  Filled 2022-10-06: qty 2

## 2022-10-06 MED ORDER — HEPARIN (PORCINE) 25000 UT/250ML-% IV SOLN
1200.0000 [IU]/h | INTRAVENOUS | Status: DC
  Administered 2022-10-06: 1000 [IU]/h via INTRAVENOUS
  Administered 2022-10-07: 1200 [IU]/h via INTRAVENOUS
  Filled 2022-10-06 (×2): qty 250

## 2022-10-06 MED ORDER — LIDOCAINE HCL (PF) 1 % IJ SOLN
INTRAMUSCULAR | Status: AC
  Filled 2022-10-06: qty 30

## 2022-10-06 MED ORDER — POTASSIUM PHOSPHATES 15 MMOLE/5ML IV SOLN
45.0000 mmol | Freq: Once | INTRAVENOUS | Status: AC
  Administered 2022-10-06: 45 mmol via INTRAVENOUS
  Filled 2022-10-06: qty 15

## 2022-10-06 MED ORDER — HYDRALAZINE HCL 20 MG/ML IJ SOLN: 5.0000 mg | INTRAMUSCULAR | Status: DC | PRN

## 2022-10-06 MED ORDER — LIDOCAINE HCL (PF) 1 % IJ SOLN
INTRAMUSCULAR | Status: DC | PRN
  Administered 2022-10-06: 10 mL

## 2022-10-06 MED ORDER — HEPARIN (PORCINE) IN NACL 1000-0.9 UT/500ML-% IV SOLN
INTRAVENOUS | Status: AC
  Filled 2022-10-06: qty 1000

## 2022-10-06 MED ORDER — ACETAMINOPHEN 325 MG PO TABS
650.0000 mg | ORAL_TABLET | ORAL | Status: DC | PRN
  Administered 2022-10-10 – 2022-10-12 (×2): 650 mg via ORAL
  Filled 2022-10-06 (×2): qty 2

## 2022-10-06 MED ORDER — ORAL CARE MOUTH RINSE: 15.0000 mL | OROMUCOSAL | Status: DC | PRN

## 2022-10-06 SURGICAL SUPPLY — 15 items
CATH ANGIO 5F BER2 100CM (CATHETERS) IMPLANT
CATH ANGIO 5F PIGTAIL 100CM (CATHETERS) IMPLANT
CATH SYNTRAX .035X135 (CATHETERS) IMPLANT
DEVICE CLOSURE MYNXGRIP 5F (Vascular Products) IMPLANT
GLIDEWIRE ADV .035X260CM (WIRE) IMPLANT
KIT MICROPUNCTURE NIT STIFF (SHEATH) IMPLANT
KIT PV (KITS) ×3 IMPLANT
SHEATH PINNACLE 5F 10CM (SHEATH) IMPLANT
SHEATH PROBE COVER 6X72 (BAG) IMPLANT
STOPCOCK MORSE 400PSI 3WAY (MISCELLANEOUS) IMPLANT
SYR MEDRAD MARK V 150ML (SYRINGE) IMPLANT
TRANSDUCER W/STOPCOCK (MISCELLANEOUS) ×3 IMPLANT
TRAY PV CATH (CUSTOM PROCEDURE TRAY) ×3 IMPLANT
TUBING CIL FLEX 10 FLL-RA (TUBING) IMPLANT
WIRE BENTSON .035X145CM (WIRE) IMPLANT

## 2022-10-06 NOTE — Progress Notes (Signed)
Surprisingly enough, [name] [name] has a left radial artery occlusion.  When I saw him yesterday morning, I was worried about a venous thrombus.  He had a Doppler which did not show any thromboembolic disease.  However, he did have an arterial study which showed a radial artery occlusion.  He has been seen by Vascular Surgery.  They will get him over to [facility] and do a angiogram on him and then see about doing a embolectomy.  I know that he has some thrombocytopenia.  I looked at his blood smear.  He has quite a few large platelets.  This tells me that his bone marrow is making the platelets.  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  His platelet count today is 62,000.  I really think that he is going to need anticoagulation.  I feel confident with his low platelets.  Again, I think his bone marrow is making the platelets they are just being destroyed by this recent infection.  Given that he has an underlying malignancy, this would certainly put him at a higher risk for thromboembolic disease.  Otherwise, he seems to be improving slowly.  He seems stronger.  His voice is better.  Hopefully, he has been eating a little bit more.  His chemistries show a sodium 134.  Potassium 3.3.  His BUN is 17 creatinine 0.9.  Calcium 7.4 with a phosphorus of 1.1.  He is on Ancef for the Klebsiella in his blood.  I think that he is off pressors right now.  His vital signs show temperature of 97.9.  Pulse 69.  Blood pressure 110/74.  Weight is 140 pounds.  On his exam, his lungs sound clear bilaterally.  Cardiac exam regular rate and rhythm.  His abdomen is soft.  There is no tenderness.  He has no guarding or rebound.  There is no fluid wave.  Extremities shows decent temperature and is left hand.  He does have some swelling.  The problem right now is this radial artery embolus.  Again, he is going over to [facility] for angiogram and resection of  this clot.  I do think that he needs anticoagulation.  I do think that heparin would be okay for him.  Again, the blood smear shows large platelets which would mean that his bone marrow is making the platelets they are just being consumed/this for more quickly.  He will definitely need anticoagulation afterwards.  With his underlying malignancy, he is at risk for thromboembolism.  I would not give him a bolus dose for the heparin.  We will await the report from his surgery.  I am just happy that he is happy that he is improving.  Obviously, he is getting incredible care from everybody in the ICU.  [name], MD  Psalm 150:6

## 2022-10-06 NOTE — Progress Notes (Signed)
[place] for heparin Indication: occluded left radial artery  No Known Allergies  Patient Measurements: Height: '6\' 1"'$  (185.4 cm) Weight: 63.5 kg (139 lb 15.9 oz) IBW/kg (Calculated) : 79.9 Heparin Dosing Weight: 63 kg  Vital Signs: Temp: 97.5 F (36.4 C) (01/10 1132) Temp Source: Oral (01/10 1132) BP: 131/89 (01/10 1615) Pulse Rate: 38 (01/10 1615)  Labs: Recent Labs    10/04/22 0404 10/05/22 0201 10/07/2022 0315  HGB 9.5* 10.7* 10.4*  HCT 27.0* 31.3* 30.6*  PLT 69* 54* 62*  CREATININE 1.01 0.93 0.90     Estimated Creatinine Clearance: 71.5 mL/min (by C-G formula based on SCr of 0.9 mg/dL).   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  He underwent a repeat ERCP on 10/10/2022 that showed esophageal plaques, hiatal hernia, pus in the major papilla, and one partially occluded stent from the biliary tree. Swelling noted in his left hand on 10/05/22. Upper extremity US performed on 10/05/22 showed occluded left radial artery and acute left superficial vein thrombosis.  Pharmacy has been consulted to dose heparin on 10/27/2022 for left occluded radial artery. Per Dr. Marin Olp, no bolus for heparin drip d/t low plts.  Today, 10/25/2022: - hgb stable at 10.4, plts low but somewhat stable at 62K - no bleeding documented - Angiography at [facility] on 10/26/2022; heparin infusion stopped pre-procedure and resume 6 hr post-procedure at 2200   Goal of Therapy:  Heparin level 0.3-0.7 units/ml Monitor platelets by anticoagulation protocol: Yes   Plan:  - at 2200, resume heparin drip at 1000 units/hr  - check 6 hr heparin level - monitor for s/sx bleeding   Suzzanne Cloud, PharmD, BCPS 10/16/2022,6:35 PM

## 2022-10-06 NOTE — Progress Notes (Signed)
[facility] ADULT ICU REPLACEMENT PROTOCOL   The patient does apply for the [facility] Adult ICU Electrolyte Replacment Protocol based on the criteria listed below:   1.Exclusion criteria: TCTS, ECMO, Dialysis, and Myasthenia Gravis patients 2. Is GFR >/= 30 ml/min? Yes.    Patient's GFR today is >60 3. Is SCr </= 2? Yes.   Patient's SCr is 0.9 mg/dL 4. Did SCr increase >/= 0.5 in 24 hours? No. 5.Pt's weight >40kg  Yes.   6. Abnormal electrolyte(s): K 3.3, Phos 1.1  7. Electrolytes replaced per protocol 8.  Call MD STAT for K+ </= 2.5, Phos </= 1, or Mag </= 1 Physician:    Ronda Fairly A 10/21/2022 5:01 AM

## 2022-10-06 NOTE — Op Note (Addendum)
Patient name: [name] [name] MRN: [id number] DOB: Oct 14, 1955 Sex: male  10/16/2022 Pre-operative Diagnosis: Left upper extremity hand ischemia status post left radial arterial line Post-operative diagnosis:  Same Surgeon:  Broadus John, MD Procedure Performed: 1.  Ultrasound-guided micropuncture access of the right common femoral artery in retrograde fashion 2.  Arch angiogram 3.  Third order cannulation, left upper extremity angiogram 4.  Moderate sedation 43 minutes 5.  Contrast volume 150 mL 6.  Device assisted closure-Mynx   Indications: This is a 67 y.o. male with what appears to be an ischemic left hand status post radial artery cannulation for hemodynamic monitoring now with occluded radial artery by recent duplex and very weak flow into the hand by ulnar artery.  This is being ongoing for a few days at this point.  Unfortunately patient with decreasing platelets per primary team not a candidate for anticoagulation at this time.  Will plan for angiography from femoral approach tomorrow in the Cath Lab at [facility].  He will be scheduled for the last case the day and will need to be transferred over for this procedure and can be sent back afterwards as long as he is stable for transfer.  I discussed with the patient and his daughter at the bedside that he is at high risk of digit loss with or without revascularization possibly could lose more than just his digits up to and including his hand.  Will likely require hand surgery involvement in his case.     Findings:  Arch angiogram demonstrates normal first-order vessels without stenosis. Left upper extremity: High bifurcation of the radial artery from the proximal brachial artery.  Sluggish flow in the radial artery to the level of the antecubital fossa where ends in collaterals.  The brachial artery continues giving off the ulnar and interosseous arteries. These demonstrate normal flow.  The interosseous artery reconstitutes the  radial artery at the level of the wrist.  The palmar arch is intact with digital arteries of the fourth and fifth digits.  There are no branches from the palmar arch to the first second and third digits.    Procedure:    The patient was identified in the holding area and taken to room 8.  The patient was then placed supine on the table and prepped and draped in the usual sterile fashion.  A time out was called.  Ultrasound was used to evaluate the right common femoral artery.  It was patent .  A digital ultrasound image was acquired.  A micropuncture needle was used to access the right common femoral artery under ultrasound guidance.  An 018 wire was advanced without resistance and a micropuncture sheath was placed.  The 018 wire was removed and a benson wire was placed.  The micropuncture sheath was exchanged for a 5 french sheath.  The patient was heparinized, and a pigtail flush catheter was placed in the ascending aorta for arch angiogram.  See results above.  Next, a Glidewire advantage wire was used to select the left subclavian artery.  The wire was exchanged for a cathter that was parked in the left axillary artery.  See results above.  In an effort to assess whether recannulization of the radial artery would improve distal flow, a wire was advanced through the radial artery into the distal patent portion located at the wrist.  Follow-up angiography demonstrated no improvement hand fifth digit perfusion.  Being that perfusion was unchanged with angiography from the distal radial artery elected to  end the case.  Arteriotomy was managed with a minx device.  Impression: High bifurcation of the radial artery with occlusion from the antecubital fossa to the wrist.  There is reconstitution of the radial artery via the interosseous artery.  In the hand, thrombosis of digital arteries in the first, second and third digits.  No flow in these digits. Palmar arch intact.  Recommend hand surgery involvement as  patient will require future amputation.    Cassandria Santee, MD [facility] Office: [phone]

## 2022-10-06 NOTE — Progress Notes (Addendum)
  Daily Progress Note    Can restart heparin at the 6 hour mark  Recommend ortho hand involvement.     Subjective: Feels better than previous, no sensation in the left hand.  Continues to have motor.  Objective: Vitals:   09/30/2022 1100 09/28/2022 1132  BP: 120/79   Pulse: 89   Resp: (!) 21   Temp:  (!) 97.5 F (36.4 C)  SpO2: 100%     Physical Examination Severe blistering in the left hand, motor intact, no sensory No radial ulnar pulses, some reperfusion erythema, hand is warm. Distal fingertips necrotic, dry. Nonlabored breathing, regular rate.  ASSESSMENT/PLAN:  Patient is a 67 year old male with history of nonischemic cardiomyopathy, EF 25%, hypertension.  He has malignant cholangiocarcinoma with biliary stricture which was initially treated with stenting.  Unfortunately this obstructed.  White count was elevated he underwent ERCP with cholangitis and purulent discharge.  He became septic post procedure requiring an A-line and ICU admission.  The A-line was placed in the left radial artery.  Over the coming days, he had ischemia of the left hand with tissue loss of the digits.  Nonpalpable left radial pulse, severe swelling in the left hand.  He presents today for angiogram in an effort to define and improve distal perfusion.  I am worried about the amount of soft tissue damage.  I am not surprised he continues to have motor function as  the forearm is not affected.  This severe blistering is also concerning.  After discussing the risk and benefits of left upper extremity angiogram with possible intervention, [name] [name] elected to proceed.   Cassandria Santee MD MS [facility] [phone] 10/05/2022  1:08 PM

## 2022-10-06 NOTE — Progress Notes (Signed)
PROGRESS NOTE    [name]  SWF:[id number] DOB: 1955/10/02 DOA: 10/11/2022 PCP: Terrilyn Saver, NP   Brief Narrative:  [name] [name] is a 67 y.o. 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   Patient has a diagnosis of cholangiocarcinoma with a malignant biliary stricture status post stenting on 09/17/2022 treated with stent obstruction.  He is noted to have septic shock on admission and his WBC elevated to 38.7 on 10/01/2021.  He was given a dose of IV Unasyn and underwent repeat ERCP and stent replacement with findings of purulent discharge and cholangitis.  He was septic postprocedure with shock and PCCM was called for admission to the ICU.  He was admitted on 09/29/2021 and then had an ERCP on 10/01/2021 and transferred to the ICU with septic shock.  He is made DNR on 10/04/2021 after discussion with the family.  Repeat echocardiogram showed an EF of less than 20%.  He has swelling of her left upper extremity overnight and arterial duplex was ordered as well as a venous duplex.  Patient was subsequently weaned off of pressors from his septic shock.  The arterial duplex showed a left obstruction noted in the radial artery.  He was initiated on a heparin drip and the medical oncologist and vascular took him for an angiogram and he was found to have a high bifurcation of the radial artery with occlusion from the antecubital fossa to the wrist.  There is reconstitution of the radial artery via the interosseous artery and in the hand thrombosis of the digital arteries in the first second and third digits.  There is no flow in the digits and the palmar arch was intact.  Vascular surgery recommended hand surgery involvement and I called [name] who requested that  vascular surgery call him directly for consultation.  He was resumed on a heparin drip 6 hours after his procedure.  GI still following given his septic shock from Enterobacter and Klebsiella.  They are recommending considering sigmoidoscopy at some point prior to discharge to evaluate the abnormal sigmoid wall thickening on the prior CT scan that was done without oral or IV contrast.  Assessment and Plan:  Septic shock secondary to cholangitis, shock resolved Biliary obstruction secondary to cholangiocarcinoma Klebsiella bacteremia -Post ERCP with stent replacement -Shock is improving and he has been weaned off of pressors Recent Labs  Lab 09/30/22 0405 10/08/2022 0529 10/22/2022 0428 10/03/22 0352 10/04/22 0404 10/05/22 0201 10/10/2022 0315  WBC 13.1* 38.7* 25.0* 17.5* 19.0* 14.4* 12.2*  -C/w Ancef at least through 1/10 which will provide coverage through 5 days after source control -   Acute respiratory failure secondary to sepsis PT/OT, mobilize -SpO2: 99 % O2 Flow Rate (L/min): 2 L/min FiO2 (%): 40 % -Wean O2 for saturation >92% -Continuous pulse oximetry maintain O2 saturation greater 90% -Continue supplemental oxygen via nasal cannula wean O2 as tolerated -Will need an amatory home O2 screen prior to discharge as well as repeat chest x-ray   Chronic heart failure, nonischemic cardiomyopathy.   -EF less than 20%.  Reduction in cardiac function likely due to septic cardiomyopathy -Hold outpatient hypertension medications -Telemetry -Hold diuretic in setting of pressor requirement, third spacing, currently weaned off of oxygen  Electrolyte abnormalities -Electrolyte Trend: Recent Labs  Lab 10/27/2022 0529 10/12/2022 0841 10/03/2022 1605  10/05/2022 0428 10/03/22 0352 10/04/22 0404 10/05/22 0201 10/07/2022 0315 10/16/2022 1800  NA 136  --  135 134* 134* 135 136 134*  --   K 2.7*   < > 4.2 4.1 3.2* 2.9* 3.1* 3.3*  --   MG 1.5*  --   --  1.9  --   --  1.6* 1.9  --   PHOS 3.0  --    --  4.7*  --   --   --  1.1* 2.4*   < > = values in this interval not displayed.  -Continue to monitor and replete as necessary -Repeat electrolytes in the a.m.   LUE hand/arm swelling -US DVT/arterial duplex done and showed a radial artery occlusion -Elevate, warm compresses -Vascular consulted and took the patient for angiogram -He was found to have a high bifurcation of the radial artery with occlusion from the antecubital fossa to the wrist.  There is reconstitution of the radial artery via the interosseous artery and in the hand thrombosis of the digital arteries in the first second and third digits.  There is no flow in the digits and the palmar arch was intact.  Vascular surgery recommended hand surgery involvement and I called [name] who requested that vascular surgery call him directly for consultation.  He was resumed on a heparin drip 6 hours after his procedure. -Per my discussion with vascular surgery he will likely need finger amputation and they will reach out to [name] directly   Esophageal candidiasis -C/w Fluconazole 1/8-   Thrombocytopenia -Possibly developing in setting acute UE DVT vs drug related vs sepsis.  -Hasn't had heparin exposure here until you start on a heparin drip this morning by the oncologist -Platelet Count Trend: Recent Labs  Lab 09/30/22 0405 09/28/2022 0529 09/28/2022 0428 10/03/22 0352 10/04/22 0404 10/05/22 0201 10/16/2022 0315  PLT 205 196 145* PLATELET CLUMPS NOTED ON SMEAR, UNABLE TO ESTIMATE 69* 89* 50*  -Trend CBC -F/u UE DVT US    AKI on chronic kidney disease secondary to shock -Contine to Monitor urine output and creatinine -BUN/Cr Trend: Recent Labs  Lab 10/19/2022 0529 09/28/2022 1605 10/08/2022 0428 10/03/22 0352 10/04/22 0404 10/05/22 0201 10/20/2022 0315  BUN '10 11 20 '$ 33* 26* 21 17  CREATININE 1.02 1.15 1.44* 1.28* 1.01 0.93 0.90  -Avoid Nephrotoxic Medications, Contrast Dyes, Hypotension and Dehydration to Ensure  Adequate Renal Perfusion and will need to Renally Adjust Meds -Continue to Monitor and Trend Renal Function carefully and repeat CMP in the AM   Abnormal LFTs -LFT Trend: Recent Labs  Lab 09/30/22 0405 09/28/2022 0529 10/22/2022 1605 09/30/2022 0428 10/03/22 0352 10/04/22 0404 10/05/22 0201  AST 54* 162* 216* 420* 204* 128* 91*  ALT 33 60* 70* 117* 110* 86* 54*  -GI Following will continue to Monitor as LFTs are slowly declining  Likely ileus -Abdomen soft, +BS -Correct electrolyte abnormalities -Mobilize   Failure to thrive, severe protein calorie malnutrition present on admission -Advance to full diet  Diarrhea -Happening after his diagnosis and unclear of the CT scan findings were real artifact but GI still planning for a sigmoidoscopy prior to discharge to rule out any problems there  DVT prophylaxis: SCDs Start: 09/28/2022 1928    Code Status: DNR Family Communication: No family currently at bedside  Disposition Plan:  Level of care: ICU Status is: Inpatient Remains inpatient appropriate because: Needs further clinical workup and evaluation by specialists   Consultants:  PCCM transfer Vascular surgery Gastroenterology Medical oncology Hand surgery -I  requested a consult from [name] however he stated that he wants the vascular surgeon to call him for a consultation  Procedures:  As delineated as above  Antimicrobials:  Anti-infectives (From admission, onward)    Start     Dose/Rate Route Frequency Ordered Stop   10/04/22 0900  fluconazole (DIFLUCAN) IVPB 200 mg        200 mg 100 mL/hr over 60 Minutes Intravenous Every 24 hours 10/04/22 0719     10/03/22 1400  ceFAZolin (ANCEF) IVPB 2g/100 mL premix        2 g 200 mL/hr over 30 Minutes Intravenous Every 8 hours 10/03/22 1025 10/08/22 2359   10/25/2022 1800  vancomycin (VANCOCIN) IVPB 1000 mg/200 mL premix  Status:  Discontinued        1,000 mg 200 mL/hr over 60 Minutes Intravenous Every 24 hours 09/30/2022  1823 10/08/2022 0801   10/04/2022 1000  fluconazole (DIFLUCAN) tablet 100 mg  Status:  Discontinued        100 mg Oral Daily 10/25/2022 1450 10/19/2022 1505   10/21/2022 0100  ceFEPIme (MAXIPIME) 2 g in sodium chloride 0.9 % 100 mL IVPB  Status:  Discontinued        2 g 200 mL/hr over 30 Minutes Intravenous Every 12 hours 10/12/2022 1823 10/03/22 1025   10/12/2022 2200  metroNIDAZOLE (FLAGYL) IVPB 500 mg  Status:  Discontinued        500 mg 100 mL/hr over 60 Minutes Intravenous Every 12 hours 10/16/2022 1823 10/03/22 1025   10/24/2022 1730  vancomycin (VANCOREADY) IVPB 1500 mg/300 mL        1,500 mg 150 mL/hr over 120 Minutes Intravenous  Once 10/14/2022 1642 10/22/2022 1951   10/08/2022 1600  piperacillin-tazobactam (ZOSYN) IVPB 3.375 g  Status:  Discontinued        3.375 g 12.5 mL/hr over 240 Minutes Intravenous Every 8 hours 10/13/2022 1451 10/12/2022 1821   09/30/2022 1500  ampicillin-sulbactam (UNASYN) 1.5 g in sodium chloride 0.9 % 100 mL IVPB  Status:  Discontinued        1.5 g 200 mL/hr over 30 Minutes Intravenous Every 8 hours 10/14/2022 1449 09/28/2022 1451   09/30/2022 1500  fluconazole (DIFLUCAN) tablet 200 mg  Status:  Discontinued        200 mg Oral  Once 10/26/2022 1450 10/13/2022 1505   10/26/2022 1300  ampicillin-sulbactam (UNASYN) 1.5 g in sodium chloride 0.9 % 100 mL IVPB        1.5 g 200 mL/hr over 30 Minutes Intravenous To Endoscopy 10/20/2022 1244 10/03/2022 1616       Subjective: Seen and examined and he was laying in the bed with his hand swollen.  Complaining of some pain.  No nausea or vomiting.  Felt okay.  Denies any chest pain or shortness breath.  No other concerns or complaints this time.  Objective: Vitals:   09/30/2022 1500 10/25/2022 1515 09/30/2022 1545 10/18/2022 1615  BP: 116/62 (!) 131/99 (!) 143/76 131/89  Pulse: 72 (!) 49  (!) 38  Resp: '12 18 15 13  '$ Temp:      TempSrc:      SpO2: 99% 99% 100% 99%  Weight:      Height:        Intake/Output Summary (Last 24 hours) at 10/08/2022 1844 Last  data filed at 10/21/2022 1600 Gross per 24 hour  Intake 1704.66 ml  Output 570 ml  Net 1134.66 ml   Filed Weights   10/04/2022 0440 10/08/2022 0500 10/03/2022 6759  Weight: 62.6 kg 63.2 kg 63.5 kg   Examination: Physical Exam:  Constitutional: Thin cachectic chronically ill-appearing older appearing than his stated age African-American male in no acute distress Respiratory: Diminished to auscultation bilaterally, no wheezing, rales, rhonchi or crackles. Normal respiratory effort and patient is not tachypenic. No accessory muscle use.  Unlabored breathing Cardiovascular: RRR, no murmurs / rubs / gallops. S1 and S2 auscultated.  Left hand is swollen and erythematous Abdomen: Soft, non-tender, nondistended.. Bowel sounds positive.  GU: Deferred. Musculoskeletal: No clubbing / cyanosis of digits/nails. No joint deformity upper and lower extremities.  Skin: No rashes, lesions, ulcers on limited skin evaluation. No induration; Warm and dry.  Neurologic: CN 2-12 grossly intact with no focal deficits.  Romberg sign and cerebellar reflexes not assessed.  Psychiatric: Normal judgment and insight. Alert and oriented x 3. Normal mood and appropriate affect.   Data Reviewed: I have personally reviewed following labs and imaging studies  CBC: Recent Labs  Lab 10/16/2022 0529 10/19/2022 0428 10/03/22 0352 10/04/22 0404 10/05/22 0201 10/13/2022 0315  WBC 38.7* 25.0* 17.5* 19.0* 14.4* 12.2*  NEUTROABS 35.6* 22.4* 15.8* 18.8* 12.5*  --   HGB 11.2* 9.6* 9.6* 9.5* 10.7* 10.4*  HCT 32.7* 28.0* 27.2* 27.0* 31.3* 30.6*  MCV 97.6 97.6 93.5 94.1 95.7 97.1  PLT 196 145* PLATELET CLUMPS NOTED ON SMEAR, UNABLE TO ESTIMATE 69* 54* 62*   Basic Metabolic Panel: Recent Labs  Lab 09/28/2022 0529 10/11/2022 0841 10/10/2022 0428 10/03/22 0352 10/04/22 0404 10/05/22 0201 10/07/2022 0315 10/20/2022 1800  NA 136   < > 134* 134* 135 136 134*  --   K 2.7*   < > 4.1 3.2* 2.9* 3.1* 3.3*  --   CL 105   < > 105 108 107 109 107   --   CO2 19*   < > 14* 17* 20* 18* 18*  --   GLUCOSE 51*   < > 215* 157* 166* 145* 153*  --   BUN 10   < > 20 33* 26* 21 17  --   CREATININE 1.02   < > 1.44* 1.28* 1.01 0.93 0.90  --   CALCIUM 8.2*   < > 7.4* 7.1* 7.6* 7.7* 7.4*  --   MG 1.5*  --  1.9  --   --  1.6* 1.9  --   PHOS 3.0  --  4.7*  --   --   --  1.1* 2.4*   < > = values in this interval not displayed.   GFR: Estimated Creatinine Clearance: 71.5 mL/min (by C-G formula based on SCr of 0.9 mg/dL). Liver Function Tests: Recent Labs  Lab 10/07/2022 1605 10/19/2022 0428 10/03/22 0352 10/04/22 0404 10/05/22 0201  AST 216* 420* 204* 128* 91*  ALT 70* 117* 110* 86* 54*  ALKPHOS 835* 795* 648* 529* 462*  BILITOT 11.1* 12.4* 9.0* 7.7* 6.6*  PROT 5.0* 5.1* 4.8* 4.7* 4.7*  ALBUMIN 1.8* 1.9* 1.8* 1.6* 1.6*   Recent Labs  Lab 10/23/2022 0428  LIPASE 34   No results for input(s): "AMMONIA" in the last 168 hours. Coagulation Profile: Recent Labs  Lab 09/30/22 0405  INR 1.3*   Cardiac Enzymes: No results for input(s): "CKTOTAL", "CKMB", "CKMBINDEX", "TROPONINI" in the last 168 hours. BNP (last 3 results) Recent Labs    09/22/22 1141  PROBNP 835.0*   HbA1C: No results for input(s): "HGBA1C" in the last 72 hours. CBG: Recent Labs  Lab 10/05/22 2344 10/26/2022 0312 09/30/2022 0728 10/14/2022 1130 10/13/2022 1458  GLUCAP  228* 171* 171* 224* 151*   Lipid Profile: Recent Labs    10/05/22 0201  TRIG 141   Thyroid Function Tests: No results for input(s): "TSH", "T4TOTAL", "FREET4", "T3FREE", "THYROIDAB" in the last 72 hours. Anemia Panel: No results for input(s): "VITAMINB12", "FOLATE", "FERRITIN", "TIBC", "IRON", "RETICCTPCT" in the last 72 hours. Sepsis Labs: Recent Labs  Lab 10/05/2022 1605 10/05/2022 0438 10/03/22 0352  LATICACIDVEN 6.6* 3.5* 1.4    Recent Results (from the past 240 hour(s))  C Difficile Quick Screen w PCR reflex     Status: None   Collection Time: 10/18/2022  6:55 AM   Specimen: STOOL  Result  Value Ref Range Status   C Diff antigen NEGATIVE NEGATIVE Final   C Diff toxin NEGATIVE NEGATIVE Final   C Diff interpretation No C. difficile detected.  Final    Comment: Performed at [facility], [place] [address], [place], [place] [address]  Gastrointestinal Panel by PCR , Stool     Status: None   Collection Time: 10/20/2022  6:55 AM  Result Value Ref Range Status   Campylobacter species NOT DETECTED NOT DETECTED Final   Plesimonas shigelloides NOT DETECTED NOT DETECTED Final   Salmonella species NOT DETECTED NOT DETECTED Final   Yersinia enterocolitica NOT DETECTED NOT DETECTED Final   Vibrio species NOT DETECTED NOT DETECTED Final   Vibrio cholerae NOT DETECTED NOT DETECTED Final   Enteroaggregative E coli (EAEC) NOT DETECTED NOT DETECTED Final   Enteropathogenic E coli (EPEC) NOT DETECTED NOT DETECTED Final   Enterotoxigenic E coli (ETEC) NOT DETECTED NOT DETECTED Final   Shiga like toxin producing E coli (STEC) NOT DETECTED NOT DETECTED Final   Shigella/Enteroinvasive E coli (EIEC) NOT DETECTED NOT DETECTED Final   Cryptosporidium NOT DETECTED NOT DETECTED Final   Cyclospora cayetanensis NOT DETECTED NOT DETECTED Final   Entamoeba histolytica NOT DETECTED NOT DETECTED Final   Giardia lamblia NOT DETECTED NOT DETECTED Final   Adenovirus F40/41 NOT DETECTED NOT DETECTED Final   Astrovirus NOT DETECTED NOT DETECTED Final   Norovirus GI/GII NOT DETECTED NOT DETECTED Final   Rotavirus A NOT DETECTED NOT DETECTED Final   Sapovirus (I, II, IV, and V) NOT DETECTED NOT DETECTED Final    Comment: Performed at [facility], [place] [place], [place] [address]  Culture, blood (Routine X 2) w Reflex to ID Panel     Status: Abnormal   Collection Time: 10/03/2022  8:41 AM   Specimen: BLOOD LEFT HAND  Result Value Ref Range Status   Specimen Description   Final    BLOOD LEFT HAND Performed at [facility], [place] [address], [place], [place]  [address]    Special Requests   Final    BOTTLES DRAWN AEROBIC ONLY Blood Culture adequate volume Performed at [place] [address] [place], [place] [address]    Culture  Setup Time   Final    GRAM NEGATIVE RODS AEROBIC BOTTLE ONLY Organism ID to follow CRITICAL RESULT CALLED TO, READ BACK BY AND VERIFIED WITH: M LILLISTON,PHARMD'@0012'$  09/28/2022 [place] Performed at [facility], [address] [place], [place] [address]    Culture KLEBSIELLA PNEUMONIAE (A)  Final   Report Status 10/03/2022 FINAL  Final   Organism ID, Bacteria KLEBSIELLA PNEUMONIAE  Final      Susceptibility   Klebsiella pneumoniae - MIC*    AMPICILLIN RESISTANT Resistant     CEFAZOLIN <=4 SENSITIVE Sensitive     CEFEPIME <=  0.12 SENSITIVE Sensitive     CEFTAZIDIME <=1 SENSITIVE Sensitive     CEFTRIAXONE <=0.25 SENSITIVE Sensitive     CIPROFLOXACIN <=0.25 SENSITIVE Sensitive     GENTAMICIN <=1 SENSITIVE Sensitive     IMIPENEM <=0.25 SENSITIVE Sensitive     TRIMETH/SULFA <=20 SENSITIVE Sensitive     AMPICILLIN/SULBACTAM <=2 SENSITIVE Sensitive     PIP/TAZO <=4 SENSITIVE Sensitive     * KLEBSIELLA PNEUMONIAE  Culture, blood (Routine X 2) w Reflex to ID Panel     Status: Abnormal   Collection Time: 10/18/2022  8:41 AM   Specimen: BLOOD RIGHT ARM  Result Value Ref Range Status   Specimen Description   Final    BLOOD RIGHT ARM Performed at [place] [address], [place], [place] [address]    Special Requests   Final    BOTTLES DRAWN AEROBIC ONLY Blood Culture adequate volume Performed at [facility] [address] [place], [place] [address]    Culture  Setup Time   Final    GRAM NEGATIVE RODS BOTTLES DRAWN AEROBIC ONLY CRITICAL VALUE NOTED.  VALUE IS CONSISTENT WITH PREVIOUSLY REPORTED AND CALLED VALUE.    Culture (A)  Final    KLEBSIELLA PNEUMONIAE SUSCEPTIBILITIES PERFORMED ON PREVIOUS CULTURE WITHIN THE LAST 5 DAYS. Performed at [facility], [place] [address] [place], [place] [address]    Report Status 10/03/2022 FINAL  Final  Blood Culture ID Panel (Reflexed)     Status: Abnormal   Collection Time: 10/24/2022  8:41 AM  Result Value Ref Range Status   Enterococcus faecalis NOT DETECTED NOT DETECTED Final   Enterococcus Faecium NOT DETECTED NOT DETECTED Final   Listeria monocytogenes NOT DETECTED NOT DETECTED Final   Staphylococcus species NOT DETECTED NOT DETECTED Final   Staphylococcus aureus (BCID) NOT DETECTED NOT DETECTED Final   Staphylococcus epidermidis NOT DETECTED NOT DETECTED Final   Staphylococcus lugdunensis NOT DETECTED NOT DETECTED Final   Streptococcus species NOT DETECTED NOT DETECTED Final   Streptococcus agalactiae NOT DETECTED NOT DETECTED Final   Streptococcus pneumoniae NOT DETECTED NOT DETECTED Final   Streptococcus pyogenes NOT DETECTED NOT DETECTED Final   A.calcoaceticus-baumannii NOT DETECTED NOT DETECTED Final   Bacteroides fragilis NOT DETECTED NOT DETECTED Final   Enterobacterales DETECTED (A) NOT DETECTED Final    Comment: Enterobacterales represent a large order of gram negative bacteria, not a single organism. CRITICAL RESULT CALLED TO, READ BACK BY AND VERIFIED WITH: M LILLISTON,PHARMD'@0012'$  10/08/2022 [place]    Enterobacter cloacae complex NOT DETECTED NOT DETECTED Final   Escherichia coli NOT DETECTED NOT DETECTED Final   Klebsiella aerogenes NOT DETECTED NOT DETECTED Final   Klebsiella oxytoca NOT DETECTED NOT DETECTED Final   Klebsiella pneumoniae DETECTED (A) NOT DETECTED Final    Comment: CRITICAL RESULT CALLED TO, READ BACK BY AND VERIFIED WITH: M LILLISTON,PHARMD'@0012'$  10/12/2022 [place]    Proteus species NOT DETECTED NOT DETECTED Final   Salmonella species NOT DETECTED NOT DETECTED Final   Serratia marcescens NOT DETECTED NOT DETECTED Final   Haemophilus influenzae NOT DETECTED NOT DETECTED Final   Neisseria meningitidis NOT DETECTED NOT DETECTED Final   Pseudomonas aeruginosa  NOT DETECTED NOT DETECTED Final   Stenotrophomonas maltophilia NOT DETECTED NOT DETECTED Final   Candida albicans NOT DETECTED NOT DETECTED Final   Candida auris NOT DETECTED NOT DETECTED Final   Candida glabrata NOT DETECTED NOT DETECTED Final   Candida krusei NOT DETECTED NOT DETECTED Final   Candida parapsilosis NOT  DETECTED NOT DETECTED Final   Candida tropicalis NOT DETECTED NOT DETECTED Final   Cryptococcus neoformans/gattii NOT DETECTED NOT DETECTED Final   CTX-M ESBL NOT DETECTED NOT DETECTED Final   Carbapenem resistance IMP NOT DETECTED NOT DETECTED Final   Carbapenem resistance KPC NOT DETECTED NOT DETECTED Final   Carbapenem resistance NDM NOT DETECTED NOT DETECTED Final   Carbapenem resist OXA 48 LIKE NOT DETECTED NOT DETECTED Final   Carbapenem resistance VIM NOT DETECTED NOT DETECTED Final    Comment: Performed at [facility], [place] [address], [place], [place] [address]  MRSA Next Gen by PCR, Nasal     Status: None   Collection Time: 10/23/2022  3:59 PM   Specimen: Nasal Mucosa; Nasal Swab  Result Value Ref Range Status   MRSA by PCR Next Gen NOT DETECTED NOT DETECTED Final    Comment: (NOTE) The GeneXpert MRSA Assay (FDA approved for NASAL specimens only), is one component of a comprehensive MRSA colonization surveillance program. It is not intended to diagnose MRSA infection nor to guide or monitor treatment for MRSA infections. Test performance is not FDA approved in patients less than 110 years old. Performed at [facility], [place] [address], [place], [place] [address]   Urine Culture     Status: None   Collection Time: 10/05/2022  4:50 PM   Specimen: Urine, Catheterized  Result Value Ref Range Status   Specimen Description   Final    URINE, CATHETERIZED Performed at [place] [address], [place], [place] [address]    Special Requests   Final    NONE Performed at [facility], [place] [address], [place], [place] [address]    Culture   Final    NO GROWTH Performed at [facility], [place] [address] [place], [place] [address]    Report Status 10/03/2022 FINAL  Final     Radiology Studies: PERIPHERAL VASCULAR CATHETERIZATION  Result Date: 10/05/2022 Images from the original result were not included.   Patient name: Endi Lagman MRN: [id number]        DOB: [birth year]        Sex: male  10/21/2022 Pre-operative Diagnosis: Left upper extremity hand ischemia status post left radial arterial line Post-operative diagnosis:  Same Surgeon:  Broadus John, MD Procedure Performed: 1.  Ultrasound-guided micropuncture access of the right common femoral artery in retrograde fashion 2.  Arch angiogram 3.  Third order cannulation, left upper extremity angiogram 4.  Moderate sedation 43 minutes 5.  Contrast volume 150 mL 6.  Device assisted closure-Mynx   Indications: This is a 67 y.o. male with what appears to be an ischemic left hand status post radial artery cannulation for hemodynamic monitoring now with occluded radial artery by recent duplex and very weak flow into the hand by ulnar artery.  This is being ongoing for a few days at this point.  Unfortunately patient with decreasing platelets per primary team not a candidate for anticoagulation at this time.  Will plan for angiography from femoral approach tomorrow in the Cath Lab at [facility].  He will be scheduled for the last case the day and will need to be transferred over for this procedure and can be sent back afterwards as long as he is stable for transfer.  I discussed with the patient and his daughter at the bedside that he is at high risk of digit loss with or without revascularization possibly could lose more than  just his digits up to and including his hand.  Will likely require hand surgery involvement in his case.    Findings: Arch angiogram demonstrates normal first-order vessels without stenosis. Left upper extremity: High bifurcation of  the radial artery from the proximal brachial artery.  Sluggish flow in the radial artery to the level of the antecubital fossa where ends in collaterals.  The brachial artery continues giving off the ulnar and interosseous arteries. These demonstrate normal flow.  The interosseous artery reconstitutes the radial artery at the level of the wrist.  The palmar arch is intact with digital arteries of the fourth and fifth digits.  There are no branches from the palmar arch to the first second and third digits.              Procedure:  The patient was identified in the holding area and taken to room 8.  The patient was then placed supine on the table and prepped and draped in the usual sterile fashion.  A time out was called.  Ultrasound was used to evaluate the right common femoral artery.  It was patent .  A digital ultrasound image was acquired.  A micropuncture needle was used to access the right common femoral artery under ultrasound guidance.  An 018 wire was advanced without resistance and a micropuncture sheath was placed.  The 018 wire was removed and a benson wire was placed.  The micropuncture sheath was exchanged for a 5 french sheath.  The patient was heparinized, and a pigtail flush catheter was placed in the ascending aorta for arch angiogram.  See results above.  Next, a Glidewire advantage wire was used to select the left subclavian artery.  The wire was exchanged for a cathter that was parked in the left axillary artery.  See results above.  In an effort to assess whether recannulization of the radial artery would improve distal flow, a wire was advanced through the radial artery into the distal patent portion located at the wrist.  Follow-up angiography demonstrated no improvement hand fifth digit perfusion.  Being that perfusion was unchanged with angiography from the distal radial artery elected to end the case.  Arteriotomy was managed with a minx device.  Impression: High bifurcation of the radial  artery with occlusion from the antecubital fossa to the wrist.  There is reconstitution of the radial artery via the interosseous artery.  In the hand, thrombosis of digital arteries in the first, second and third digits.  No flow in these digits. Palmar arch intact.  Recommend hand surgery involvement as patient will require future amputation.   Cassandria Santee, MD [facility] Office: [phone]   VAS [place] UPPER EXTREMITY VENOUS DUPLEX  Result Date: 10/05/2022 UPPER VENOUS STUDY  Patient Name:  NOLAND PIZANO  Date of Exam:   10/05/2022 Medical Rec #: [id number]       Accession #:    [id number] Date of Birth: 1956-06-06      Patient Gender: M Patient Age:   43 years Exam Location:  [facility] Procedure:      VAS [place] UPPER EXTREMITY VENOUS DUPLEX Referring Phys: Burney Gauze --------------------------------------------------------------------------------  Indications: Swelling Risk Factors: Cancer. Limitations: Body habitus, poor ultrasound/tissue interface and patient positioning. Comparison Study: No prior studies. Performing Technologist: Oliver Hum RVT  Examination Guidelines: A complete evaluation includes B-mode imaging, spectral Doppler, color Doppler, and power Doppler as needed of all accessible portions of each vessel. Bilateral testing is considered an integral  part of a complete examination. Limited examinations for reoccurring indications may be performed as noted.  Right Findings: +----------+------------+---------+-----------+----------+-------+ RIGHT     CompressiblePhasicitySpontaneousPropertiesSummary +----------+------------+---------+-----------+----------+-------+ Subclavian    Full       Yes       Yes                      +----------+------------+---------+-----------+----------+-------+  Left Findings: +----------+------------+---------+-----------+----------+-------+ LEFT      CompressiblePhasicitySpontaneousPropertiesSummary  +----------+------------+---------+-----------+----------+-------+ IJV           Full       Yes       Yes                      +----------+------------+---------+-----------+----------+-------+ Subclavian    Full       Yes       Yes                      +----------+------------+---------+-----------+----------+-------+ Axillary      Full       Yes       Yes                      +----------+------------+---------+-----------+----------+-------+ Brachial      Full       Yes       Yes                      +----------+------------+---------+-----------+----------+-------+ Radial        Full                                          +----------+------------+---------+-----------+----------+-------+ Ulnar         Full                                          +----------+------------+---------+-----------+----------+-------+ Cephalic      Full                                          +----------+------------+---------+-----------+----------+-------+ Basilic       None                                   Acute  +----------+------------+---------+-----------+----------+-------+  Summary:  Right: No evidence of thrombosis in the subclavian.  Left: No evidence of deep vein thrombosis in the upper extremity. Findings consistent with acute superficial vein thrombosis involving the left basilic vein.  *See table(s) above for measurements and observations.  Diagnosing physician: Servando Snare MD Electronically signed by Servando Snare MD on 10/05/2022 at 3:05:52 PM.    Final    VAS [place] UPPER EXTREMITY ARTERIAL DUPLEX  Result Date: 10/05/2022  UPPER EXTREMITY DUPLEX STUDY Patient Name:  RUAL VERMEER  Date of Exam:   10/05/2022 Medical Rec #: [id number]       Accession #:    [id number] Date of Birth: 08-Oct-1955      Patient Gender: M Patient Age:   9 years Exam Location:  [facility] Procedure:      VAS [place] UPPER EXTREMITY ARTERIAL DUPLEX Referring Phys:  EMILY AVENTURA  --------------------------------------------------------------------------------  Indications: Assymetrical blood pressures.  Risk Factors: Hypertension. Limitations: Body habitus, poor ultrasound/tissue interface and patient              positioning. Comparison Study: No prior studies. Performing Technologist: Oliver Hum RVT  Examination Guidelines: A complete evaluation includes B-mode imaging, spectral Doppler, color Doppler, and power Doppler as needed of all accessible portions of each vessel. Bilateral testing is considered an integral part of a complete examination. Limited examinations for reoccurring indications may be performed as noted.  Left Doppler Findings: +--------------+----------+---------+--------+--------+ Site          PSV (cm/s)Waveform StenosisComments +--------------+----------+---------+--------+--------+ Subclavian Mid54        triphasic                 +--------------+----------+---------+--------+--------+ Axillary      37        triphasic                 +--------------+----------+---------+--------+--------+ Brachial Prox 39        biphasic                  +--------------+----------+---------+--------+--------+ Radial Prox   0                  occluded         +--------------+----------+---------+--------+--------+ Radial Mid    0                  occluded         +--------------+----------+---------+--------+--------+ Radial Dist   0                  occluded         +--------------+----------+---------+--------+--------+ Ulnar Prox    46        biphasic                  +--------------+----------+---------+--------+--------+ Ulnar Mid     36        biphasic                  +--------------+----------+---------+--------+--------+ Ulnar Dist    32        biphasic                  +--------------+----------+---------+--------+--------+ The radial and ulnar arteries bifurcate in the proximal upper arm.  Summary:  Left:  Obstruction noted in the radial artery. *See table(s) above for measurements and observations. Electronically signed by Servando Snare MD on 10/05/2022 at 3:05:43 PM.    Final     Scheduled Meds:  Chlorhexidine Gluconate Cloth  6 each Topical Daily   digoxin  0.125 mg Oral Daily   docusate  100 mg Per Tube BID   feeding supplement  1 Container Oral TID BM   hydrocortisone sod succinate (SOLU-CORTEF) inj  100 mg Intravenous Q8H   insulin aspart  0-9 Units Subcutaneous Q4H   polyethylene glycol  17 g Per Tube Daily   sodium chloride flush  3 mL Intravenous Q12H   Continuous Infusions:  sodium chloride 1 mL/kg/hr (10/20/2022 1509)    ceFAZolin (ANCEF) IV Stopped (10/05/2022 0540)   dextrose 25 mL/hr at 09/28/2022 1759   fluconazole (DIFLUCAN) IV Stopped (10/05/2022 1109)   heparin     norepinephrine (LEVOPHED) Adult infusion Stopped (10/04/22 1306)   phenylephrine (NEO-SYNEPHRINE) Adult infusion Stopped (10/24/2022 1731)   propofol (DIPRIVAN) infusion Stopped (10/03/22 0956)   vasopressin Stopped (10/04/22 0819)    LOS: 7 days  Raiford Noble, DO [facility] Available via Epic secure chat 7am-7pm After these hours, please refer to coverage provider listed on [email or web address] 10/21/2022, 6:44 PM

## 2022-10-06 NOTE — Progress Notes (Addendum)
[place] for heparin Indication: occluded left radial artery  No Known Allergies  Patient Measurements: Height: '6\' 1"'$  (185.4 cm) Weight: 63.5 kg (139 lb 15.9 oz) IBW/kg (Calculated) : 79.9 Heparin Dosing Weight: 63 kg  Vital Signs: Temp: 97.9 F (36.6 C) (01/10 0317) Temp Source: Oral (01/10 0317) BP: 119/87 (01/10 0700) Pulse Rate: 82 (01/10 0700)  Labs: Recent Labs    10/04/22 0404 10/05/22 0201 10/13/2022 0315  HGB 9.5* 10.7* 10.4*  HCT 27.0* 31.3* 30.6*  PLT 69* 54* 62*  CREATININE 1.01 0.93 0.90    Estimated Creatinine Clearance: 71.5 mL/min (by C-G formula based on SCr of 0.9 mg/dL).   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  He underwent a repeat ERCP on 09/28/2022 that showed esophageal plaques, hiatal hernia, pus in the major papilla, and one partially occluded stent from the biliary tree. Swelling noted in his left hand on 10/05/22. Upper extremity US performed on 10/05/22 showed occluded left radial artery and acute left superficial vein thrombosis.  Pharmacy has been consulted to dose heparin on 10/27/2022 for left occluded radial artery. Per Dr. Marin Olp, no bolus for heparin drip d/t low plts.  Today, 09/27/2022: - hgb stable at 10.4, plts low but somewhat stable at 62K - no bleeding documented - plan for angiography at [facility] on 10/25/2022   Goal of Therapy:  Heparin level 0.3-0.7 units/ml Monitor platelets by anticoagulation protocol: Yes   Plan:  - start heparin drip at 1000 units/hr - check 6 hr heparin level - monitor for s/sx bleeding - please advise if/when heparin drip needs to be held for procedure later this afternoon  Neftali Thurow P 10/22/2022,7:11 AM  _____________________________________  Adden: Per Dr.  Donzetta Matters (via [place]), heparin drip does not need to be held for procedure today.  Dia Sitter, PharmD, BCPS 10/22/2022 9:00 AM

## 2022-10-07 ENCOUNTER — Encounter: Payer: Medicare Other | Admitting: Dietician

## 2022-10-07 ENCOUNTER — Encounter (HOSPITAL_COMMUNITY): Payer: Self-pay | Admitting: Vascular Surgery

## 2022-10-07 DIAGNOSIS — B3781 Candidal esophagitis: Secondary | ICD-10-CM

## 2022-10-07 DIAGNOSIS — I5022 Chronic systolic (congestive) heart failure: Secondary | ICD-10-CM | POA: Diagnosis not present

## 2022-10-07 DIAGNOSIS — C241 Malignant neoplasm of ampulla of Vater: Secondary | ICD-10-CM | POA: Diagnosis not present

## 2022-10-07 DIAGNOSIS — R627 Adult failure to thrive: Secondary | ICD-10-CM | POA: Diagnosis not present

## 2022-10-07 DIAGNOSIS — E876 Hypokalemia: Secondary | ICD-10-CM | POA: Diagnosis not present

## 2022-10-07 DIAGNOSIS — K529 Noninfective gastroenteritis and colitis, unspecified: Secondary | ICD-10-CM

## 2022-10-07 LAB — COMPREHENSIVE METABOLIC PANEL
ALT: 24 U/L (ref 0–44)
AST: 53 U/L — ABNORMAL HIGH (ref 15–41)
Albumin: 1.8 g/dL — ABNORMAL LOW (ref 3.5–5.0)
Alkaline Phosphatase: 394 U/L — ABNORMAL HIGH (ref 38–126)
Anion gap: 7 (ref 5–15)
BUN: 14 mg/dL (ref 8–23)
CO2: 19 mmol/L — ABNORMAL LOW (ref 22–32)
Calcium: 7.4 mg/dL — ABNORMAL LOW (ref 8.9–10.3)
Chloride: 110 mmol/L (ref 98–111)
Creatinine, Ser: 0.91 mg/dL (ref 0.61–1.24)
GFR, Estimated: >60 mL/min (ref >60)
Glucose, Bld: 143 mg/dL — ABNORMAL HIGH (ref 70–99)
Potassium: 3.2 mmol/L — ABNORMAL LOW (ref 3.5–5.1)
Sodium: 136 mmol/L (ref 135–145)
Total Bilirubin: 4.9 mg/dL — ABNORMAL HIGH (ref 0.3–1.2)
Total Protein: 4.6 g/dL — ABNORMAL LOW (ref 6.5–8.1)

## 2022-10-07 LAB — CBC WITH DIFFERENTIAL/PLATELET
Abs Immature Granulocytes: 0.38 10*3/uL — ABNORMAL HIGH (ref 0.00–0.07)
Basophils Absolute: 0 10*3/uL (ref 0.0–0.1)
Basophils Relative: 0 %
Eosinophils Absolute: 0 10*3/uL (ref 0.0–0.5)
Eosinophils Relative: 0 %
HCT: 29.6 % — ABNORMAL LOW (ref 39.0–52.0)
Hemoglobin: 9.9 g/dL — ABNORMAL LOW (ref 13.0–17.0)
Immature Granulocytes: 3 %
Lymphocytes Relative: 10 %
Lymphs Abs: 1.3 10*3/uL (ref 0.7–4.0)
MCH: 32.8 pg (ref 26.0–34.0)
MCHC: 33.4 g/dL (ref 30.0–36.0)
MCV: 98 fL (ref 80.0–100.0)
Monocytes Absolute: 0.7 10*3/uL (ref 0.1–1.0)
Monocytes Relative: 5 %
Neutro Abs: 11.1 10*3/uL — ABNORMAL HIGH (ref 1.7–7.7)
Neutrophils Relative %: 82 %
Platelets: 84 10*3/uL — ABNORMAL LOW (ref 150–400)
RBC: 3.02 MIL/uL — ABNORMAL LOW (ref 4.22–5.81)
RDW: 14.9 % (ref 11.5–15.5)
WBC: 13.5 10*3/uL — ABNORMAL HIGH (ref 4.0–10.5)
nRBC: 0.5 % — ABNORMAL HIGH (ref 0.0–0.2)

## 2022-10-07 LAB — HEPARIN LEVEL (UNFRACTIONATED)
Heparin Unfractionated: 0.11 IU/mL — ABNORMAL LOW (ref 0.30–0.70)
Heparin Unfractionated: 0.35 IU/mL (ref 0.30–0.70)
Heparin Unfractionated: 0.42 IU/mL (ref 0.30–0.70)

## 2022-10-07 LAB — GLUCOSE, CAPILLARY
Glucose-Capillary: 147 mg/dL — ABNORMAL HIGH (ref 70–99)
Glucose-Capillary: 153 mg/dL — ABNORMAL HIGH (ref 70–99)
Glucose-Capillary: 168 mg/dL — ABNORMAL HIGH (ref 70–99)
Glucose-Capillary: 174 mg/dL — ABNORMAL HIGH (ref 70–99)
Glucose-Capillary: 222 mg/dL — ABNORMAL HIGH (ref 70–99)
Glucose-Capillary: 241 mg/dL — ABNORMAL HIGH (ref 70–99)
Glucose-Capillary: 245 mg/dL — ABNORMAL HIGH (ref 70–99)

## 2022-10-07 LAB — LIPID PANEL
Cholesterol: 204 mg/dL — ABNORMAL HIGH (ref 0–200)
HDL: 19 mg/dL — ABNORMAL LOW (ref >40)
LDL Cholesterol: 158 mg/dL — ABNORMAL HIGH (ref 0–99)
Total CHOL/HDL Ratio: 10.7 RATIO
Triglycerides: 134 mg/dL (ref <150)
VLDL: 27 mg/dL (ref 0–40)

## 2022-10-07 LAB — PHOSPHORUS: Phosphorus: 2 mg/dL — ABNORMAL LOW (ref 2.5–4.6)

## 2022-10-07 LAB — MAGNESIUM: Magnesium: 1.9 mg/dL (ref 1.7–2.4)

## 2022-10-07 LAB — APTT: aPTT: 56 seconds — ABNORMAL HIGH (ref 24–36)

## 2022-10-07 MED ORDER — POTASSIUM PHOSPHATES 15 MMOLE/5ML IV SOLN
20.0000 mmol | Freq: Once | INTRAVENOUS | Status: AC
  Administered 2022-10-07: 20 mmol via INTRAVENOUS
  Filled 2022-10-07: qty 6.67

## 2022-10-07 MED ORDER — ADULT MULTIVITAMIN W/MINERALS CH
1.0000 | ORAL_TABLET | Freq: Every day | ORAL | Status: DC
  Administered 2022-10-08 – 2022-10-14 (×5): 1 via ORAL
  Filled 2022-10-07 (×7): qty 1

## 2022-10-07 MED ORDER — POTASSIUM CHLORIDE CRYS ER 20 MEQ PO TBCR
40.0000 meq | EXTENDED_RELEASE_TABLET | Freq: Once | ORAL | Status: AC
  Administered 2022-10-07: 40 meq via ORAL
  Filled 2022-10-07: qty 2

## 2022-10-07 NOTE — Progress Notes (Signed)
Unfortunately, there was nothing that could be done to try to revascularize the left hand.  He was taken over to [facility] yesterday.  Vascular Surgery try to work hard to get the radial artery opened up.  It did not appear to be any avenue nor to do this.  It sounds like he is going need to have some digital amputation.  He is on heparin.  He is doing well on heparin.  His platelet count is coming back up nicely.  His platelet count is 84,000 today.  I suspect this is related to the fact that his Klebsiella is resolving.  He really does look much better to me.  He still looks a whole lot better than when I saw him in the office initially a week ago.  His bilirubin is down to 4.9.  His albumin is 1.8.  He really needs to work on his nutritional state now.  I am sure that nutrition service will be able to assist with his caloric intake.  He does need a lot of physical therapy.  He is would not even out of bed.  His phosphorus is on the low side at 2.0.  Alkaline phosphatase is 394.  His potassium is 3.2.  His white cell count is 13.5 with a hemoglobin of 9.9.  He still has the severe congestive heart failure.  I do not know if Cardiology needs to see him to try to help his cardiac function.  Again, this Klebsiella bacteremia is resolving.  His blood pressure is doing better.  He is off pressors.  His vital signs are temperature of 97.4.  Pulse 87.  Blood pressure 123/78.  His lungs sound clear bilaterally.  Cardiac exam regular rate and rhythm.  Has extra beats.  Abdomen is soft.  There is no guarding or rebound tenderness.  He has no fluid wave.  There is no obvious abdominal mass.  Extremities does show the cyanosis with the left hand.  He does have some blistering in the first 3 fingers. He does have decent range of motion of the left wrist.  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  Again, I really do not think this is a long-term problem for him.  From the study so far there is  no evidence of any metastatic disease.  This is apparently a localized issue.  Again he is improving with his status so we might be able to do some radiotherapy.  He might be a candidate for radiosurgery.  I still think that there are issues that need to be addressed before that could be considered.  The obvious big issue is this vascular insufficiency of the left hand.  He is on heparin.  I would keep him on heparin while he is in the hospital.  I do not know if there are any plans for further surgery.  Maybe, if he continues to improve, he will be able to be moved out of the ICU.  I do appreciate the incredible care that he is getting from everybody in the ICU.  [name], MD  Psalms 27:4

## 2022-10-07 NOTE — Progress Notes (Signed)
[place] for heparin Indication: occluded left radial artery  No Known Allergies  Patient Measurements: Height: '6\' 1"'$  (185.4 cm) Weight: 66.9 kg (147 lb 7.8 oz) IBW/kg (Calculated) : 79.9 Heparin Dosing Weight: 63 kg  Vital Signs: Temp: 97.7 F (36.5 C) (01/11 0800) Temp Source: Oral (01/11 0800) BP: 130/66 (01/11 1109) Pulse Rate: 84 (01/11 1109)  Labs: Recent Labs    10/05/22 0201 10/22/2022 0315 10/07/22 0355 10/07/22 1121  HGB 10.7* 10.4* 9.9*  --   HCT 31.3* 30.6* 29.6*  --   PLT 54* 62* 84*  --   APTT  --   --  56*  --   HEPARINUNFRC  --   --  0.11* 0.35  CREATININE 0.93 0.90 0.91  --      Estimated Creatinine Clearance: 74.5 mL/min (by C-G formula based on SCr of 0.91 mg/dL).   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  He underwent a repeat ERCP on 10/23/2022 that showed esophageal plaques, hiatal hernia, pus in the major papilla, and one partially occluded stent from the biliary tree. Swelling noted in his left hand on 10/05/22. Upper extremity US performed on 10/05/22 showed occluded left radial artery and acute left superficial vein thrombosis.  Pharmacy has been consulted to dose heparin on 10/08/2022 for left occluded radial artery. Per Dr. Marin Olp, no bolus for heparin drip d/t low plts.  Today, 10/07/2022: - HL 0.35, therapeutic on 1200 units/hr Hgb 9.9, plts up to 84 Per RN no interruptions and no bleeding   Goal of Therapy:  Heparin level 0.3-0.7 units/ml Monitor platelets by anticoagulation protocol: Yes   Plan:  Continue  heparin drip at 1200 units/hr - check 6 hr heparin level - monitor for s/sx bleeding    Royetta Asal, PharmD, BCPS 10/07/2022 12:07 PM

## 2022-10-07 NOTE — Progress Notes (Signed)
Nutrition Follow-up  DOCUMENTATION CODES:   Severe malnutrition in context of chronic illness  INTERVENTION:  - Regular diet.  - Continue Boost Breeze po TID, each supplement provides 250 kcal and 9 grams of protein - Encourage intake at all meals and of supplements.  - Daily multivitamin to support micronutrient needs. - Monitor weight trends.    NUTRITION DIAGNOSIS:   Severe Malnutrition related to chronic illness (ampullary carcinoma with malignant biliary stricture) as evidenced by severe fat depletion, severe muscle depletion.   GOAL:   Patient will meet greater than or equal to 90% of their needs  MONITOR:   PO intake, Weight trends, Labs, Supplement acceptance  REASON FOR ASSESSMENT:   Consult Assessment of nutrition requirement/status  ASSESSMENT:   67 y.o. 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  Patient resting in bed at time of visit. He reports a recent UBW of 135# but notes he had started losing weight before Christmas, around 1 month ago. Per weight records, pt has lost 18 lbs since 5/23 (12% wt loss x 7.5 months, borderline insignificant for time frame). However, current weight is elevated as patient experiencing edema and is +7L which is likely affecting current weight status and could be masking further changes.   Patient reports he was "barely eating" PTA due to have a lot of diarrhea. Did have a good appetite. Was not drinking any nutrition supplements.   Thankfully, he reports his appetite is still good and he has been able to eat more over the past week since admission. Only 1 meal documented over the past week, 100% of breakfast yesterday. Notes he cannot eat a lot at once but doing better overall.  Occasionally consuming Boost Breeze. Doesn't love it but does not want to try another supplement at this time. Discussed importance of trying to eat well during admission and consuming nutrition supplements to avoid weight loss. Patient endorsed understanding.  Medications reviewed and include: Insulin  Labs reviewed:  K+ 3.2 Phosphorus 2.0   NUTRITION - FOCUSED PHYSICAL EXAM:  Flowsheet Row Most Recent Value  Orbital Region Moderate depletion  Upper Arm Region Severe depletion  Thoracic and Lumbar Region Severe depletion  Buccal Region Severe depletion  Temple Region Severe depletion  Clavicle Bone Region Severe depletion  Clavicle and Acromion Bone Region Severe depletion  Scapular Bone Region Unable to assess  Dorsal Hand No depletion  [edema]  Patellar Region Severe depletion  Anterior Thigh Region Severe depletion  Posterior Calf Region Severe depletion  Edema (RD Assessment) Mild  Hair Reviewed  Eyes Reviewed  Mouth Reviewed  Skin Reviewed  Nails Reviewed       Diet Order:   Diet Order             Diet regular Room service appropriate? Yes; Fluid consistency: Thin  Diet effective now                   EDUCATION NEEDS:  Education needs have been addressed  Skin:  Skin Assessment: Reviewed RN Assessment  Last BM:  1/11  Height:  Ht Readings from Last 1 Encounters:  09/30/22 '6\' 1"'$  (1.854 m)   Weight:  Wt Readings from Last 1 Encounters:  10/07/22 66.9 kg    BMI:  Body mass index is 19.46 kg/m.  Estimated Nutritional Needs:  Kcal:  1900-2100 Protein:  100-115g Fluid:  2L/day    [name] [name] RD, LDN For contact information, refer to [facility]

## 2022-10-07 NOTE — Progress Notes (Signed)
[place] for heparin Indication: occluded left radial artery  No Known Allergies  Patient Measurements: Height: '6\' 1"'$  (185.4 cm) Weight: 66.9 kg (147 lb 7.8 oz) IBW/kg (Calculated) : 79.9 Heparin Dosing Weight: 63 kg  Vital Signs: Temp: 97.7 F (36.5 C) (01/11 1600) Temp Source: Oral (01/11 1600) BP: 143/71 (01/11 1200) Pulse Rate: 78 (01/11 1200)  Labs: Recent Labs    10/05/22 0201 10/13/2022 0315 10/07/22 0355 10/07/22 1121 10/07/22 1805  HGB 10.7* 10.4* 9.9*  --   --   HCT 31.3* 30.6* 29.6*  --   --   PLT 54* 62* 84*  --   --   APTT  --   --  56*  --   --   HEPARINUNFRC  --   --  0.11* 0.35 0.42  CREATININE 0.93 0.90 0.91  --   --      Estimated Creatinine Clearance: 74.5 mL/min (by C-G formula based on SCr of 0.91 mg/dL).   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  He underwent a repeat ERCP on 10/24/2022 that showed esophageal plaques, hiatal hernia, pus in the major papilla, and one partially occluded stent from the biliary tree. Swelling noted in his left hand on 10/05/22. Upper extremity US performed on 10/05/22 showed occluded left radial artery and acute left superficial vein thrombosis.  Pharmacy has been consulted to dose heparin on 09/27/2022 for left occluded radial artery. Per Dr. Marin Olp, no bolus for heparin drip d/t low plts.  Today, 10/07/2022: -confirmatory HL 0.42, remains therapeutic on 1200 units/hr Hgb 9.9, plts up to 84 Per RN no interruptions and no bleeding  Goal of Therapy:  Heparin level 0.3-0.7 units/ml Monitor platelets by anticoagulation protocol: Yes   Plan:  Continue  heparin drip at 1200 units/hr Daily CBC & heparin level monitor for s/sx bleeding  Eudelia Bunch, Pharm.D Use secure chat  for questions 10/07/2022 6:56 PM

## 2022-10-07 NOTE — Progress Notes (Signed)
Patient's biliary sepsis has resolved.  His diarrhea has also improved.  We can readdress diarrhea if it returns and also reconsider flexible sigmoidoscopy due to some abnormal findings of wall thickening in the sigmoid colon on CT scan during his hospitalization after his other acute issues have been addressed.  He is going to require amputation of likely 3 digits.  GI is going to sign off for now.  Please contact us back when patient improved and able to proceed with flex sig.

## 2022-10-07 NOTE — Progress Notes (Signed)
[place] for heparin Indication: occluded left radial artery  No Known Allergies  Patient Measurements: Height: '6\' 1"'$  (185.4 cm) Weight: 63.5 kg (139 lb 15.9 oz) IBW/kg (Calculated) : 79.9 Heparin Dosing Weight: 63 kg  Vital Signs: Temp: 97.4 F (36.3 C) (01/11 0345) Temp Source: Axillary (01/11 0345) BP: 123/78 (01/11 0400) Pulse Rate: 87 (01/11 0400)  Labs: Recent Labs    10/05/22 0201 09/30/2022 0315 10/07/22 0355  HGB 10.7* 10.4* 9.9*  HCT 31.3* 30.6* 29.6*  PLT 54* 62* 84*  APTT  --   --  56*  HEPARINUNFRC  --   --  0.11*  CREATININE 0.93 0.90 0.91     Estimated Creatinine Clearance: 70.7 mL/min (by C-G formula based on SCr of 0.91 mg/dL).   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  He underwent a repeat ERCP on 10/24/2022 that showed esophageal plaques, hiatal hernia, pus in the major papilla, and one partially occluded stent from the biliary tree. Swelling noted in his left hand on 10/05/22. Upper extremity US performed on 10/05/22 showed occluded left radial artery and acute left superficial vein thrombosis.  Pharmacy has been consulted to dose heparin on 09/28/2022 for left occluded radial artery. Per Dr. Marin Olp, no bolus for heparin drip d/t low plts.  Today, 10/07/2022: - HL 0.11 subtherapeutic on 1000 units/hr Hgb 9.9, plts up to 84 Per RN no interruptions and no bleeding   Goal of Therapy:  Heparin level 0.3-0.7 units/ml Monitor platelets by anticoagulation protocol: Yes   Plan:  Increase heparin drip to 1200 units/hr - check 6 hr heparin level - monitor for s/sx bleeding   Dolly Rias RPh 10/07/2022, 4:59 AM

## 2022-10-07 NOTE — Progress Notes (Signed)
PT Cancellation Note  Patient Details Name: [name] [name] MRN: [id number] DOB: [birth year]   Cancelled Treatment:    Reason Eval/Treat Not Completed: Medical issues which prohibited therapy. [place] Office [phone] Weekend [phone]   [name] 10/07/2022, 8:15 AM

## 2022-10-07 NOTE — Progress Notes (Signed)
PROGRESS NOTE    [name] [name]  XQJ:[id number] DOB: June 01, 1956 DOA: 10/23/2022 PCP: Terrilyn Saver, NP   Brief Narrative:  [name] [name] is a 67 y.o. 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    Patient has a diagnosis of cholangiocarcinoma with a malignant biliary stricture status post stenting on 09/17/2022 treated with stent obstruction.  He is noted to have septic shock on admission and his WBC elevated to 38.7 on 10/01/2021.  He was given a dose of IV Unasyn and underwent repeat ERCP and stent replacement with findings of purulent discharge and cholangitis.  He was septic postprocedure with shock and PCCM was called for admission to the ICU.  He was admitted on 09/29/2021 and then had an ERCP on 10/01/2021 and transferred to the ICU with septic shock.  He is made DNR on 10/04/2021 after discussion with the family.  Repeat echocardiogram showed an EF of less than 20%.  He has swelling of her left upper extremity overnight and arterial duplex was ordered as well as a venous duplex.  Patient was subsequently weaned off of pressors from his septic shock.  The arterial duplex showed a left obstruction noted in the radial artery.  He was initiated on a heparin drip and the medical oncologist and vascular took him for an angiogram and he was found to have a high bifurcation of the radial artery with occlusion from the antecubital fossa to the wrist.  There is reconstitution of the radial artery via the interosseous artery and in the hand thrombosis of the digital arteries in the first second and third digits.  There is no flow in the digits and the palmar arch was intact.  Vascular surgery recommended hand surgery involvement and I called [name] who requested that  vascular surgery call him directly for consultation.  He was resumed on a heparin drip 6 hours after his procedure.   GI still following given his septic shock from Klebsiella.  They are recommending considering sigmoidoscopy at some point prior to discharge to evaluate the abnormal sigmoid wall thickening on the prior CT scan that was done without oral or IV contrast.  Given his improvement in his diarrhea and they are regular readdress it and reconsider flex sigmoidoscopy  due to some abnormal finding of the wall thickening in the sigmoid colon due to his other acute issues. Currently awaiting Hand Surgery evaluation.   Assessment and Plan:  Septic shock secondary to cholangitis, shock resolved Biliary obstruction secondary to cholangiocarcinoma with Hyperbilirubinemia Klebsiella Bacteremia -Post ERCP with stent replacement -Shock is improving and he has been weaned off of pressors Recent Labs  Lab 10/25/2022 0529 10/23/2022 0428 10/03/22 0352 10/04/22 0404 10/05/22 0201 10/20/2022 0315 10/07/22 0355  WBC 38.7* 25.0* 17.5* 19.0* 14.4* 12.2* 13.5*  -Bilirubin went from 6.6 -> 4.9 -C/w Ancef at least through 1/10 which will provide coverage through 5 days after source control -Continue to Monitor and Trend and Repeat CBC in the AM    Acute Respiratory Failure Secondary to Sepsis PT/OT, mobilize -SpO2: 100 % O2 Flow Rate (L/min): 2 L/min FiO2 (%): 40 % -Continuous pulse oximetry maintain O2 saturation greater 90% -Continue supplemental oxygen via nasal cannula wean O2 as tolerated -Will need an amatory home O2 screen prior to discharge as well as repeat chest x-ray  Chronic Heart Failure, nonischemic cardiomyopathy.   -EF less than 20%.  Reduction in cardiac function likely due to septic cardiomyopathy -Hold outpatient hypertension medications -C/w Telemetry -Hold diuretic in setting of pressor requirement, third spacing, currently weaned off of oxygen -Appears Euvolemic at this  moment    Electrolyte abnormalities -Electrolyte Trend: Recent Labs  Lab 10/14/2022 1605 10/27/2022 0428 10/03/22 0352 10/04/22 0404 10/05/22 0201 10/04/2022 0315 10/21/2022 1800 10/07/22 0355  NA 135 134* 134* 135 136 134*  --  136  K 4.2 4.1 3.2* 2.9* 3.1* 3.3*  --  3.2*  MG  --  1.9  --   --  1.6* 1.9  --  1.9  PHOS  --  4.7*  --   --   --  1.1*   < > 2.0*   < > = values in this interval not displayed.  -Replete K+ and Phos with IV K Phos 20 mmol and po Kcl 40 mEQ x1 -Continue to monitor and replete as necessary -Repeat electrolytes in the a.m.   LUE hand/arm swelling -US DVT/arterial duplex done and showed a radial artery occlusion -Elevate, warm compresses -Vascular consulted and took the patient for angiogram -He was found to have a high bifurcation of the radial artery with occlusion from the antecubital fossa to the wrist.  There is reconstitution of the radial artery via the interosseous artery and in the hand thrombosis of the digital arteries in the first second and third digits.  There is no flow in the digits and the palmar arch was intact.  Vascular surgery recommended hand surgery involvement and I called [name] who requested that vascular surgery call him directly for consultation.  He was resumed on a heparin drip 6 hours after his procedure. -Per my discussion with vascular surgery he will likely need finger amputation and they will reach out to [name] directly -Awaiting Hand Surgery Evaluation   Esophageal candidiasis -C/w Fluconazole 1/8-   Thrombocytopenia -Possibly developing in setting acute UE DVT vs drug related vs sepsis.  -Hasn't had heparin exposure here until you start on a heparin drip this morning by the oncologist -Platelet Count Trend: Recent Labs  Lab 09/27/2022 0529 09/28/2022 0428 10/03/22 0352 10/04/22 0404 10/05/22 0201 10/08/2022 0315 10/07/22 0355  PLT 196 145* PLATELET CLUMPS NOTED ON SMEAR, UNABLE TO ESTIMATE 69* 57* 75* 31*   -Trend CBC    AKI on chronic kidney disease secondary to shock Metabolic Acidoiss -Contine to Monitor urine output and creatinine -BUN/Cr Trend: Recent Labs  Lab 10/04/2022 1605 10/27/2022 0428 10/03/22 0352 10/04/22 0404 10/05/22 0201 10/08/2022 0315 10/07/22 0355  BUN 11 20 33* 26* '21 17 14  '$ CREATININE 1.15 1.44* 1.28* 1.01 0.93 0.90 0.91  -Avoid Nephrotoxic Medications, Contrast Dyes, Hypotension and Dehydration to Ensure Adequate Renal Perfusion and will need to Renally Adjust Meds -Patient has a Slight Metabolic Acidosis with a  CO2 of 19, AG of 7, and Chloride Level of 19 -Continue to Monitor and Trend Renal Function carefully and repeat CMP in the AM    Abnormal LFTs -LFT Trend: Recent Labs  Lab 09/30/2022 0529 10/24/2022 1605 10/25/2022 0428 10/03/22 0352 10/04/22 0404 10/05/22 0201 10/07/22 0355  AST 162* 216* 420* 204* 128* 91* 53*  ALT 60* 70* 117* 110* 86* 54* 24  -GI Following will continue to Monitor as LFTs are slowly declining   Likely ileus -Abdomen soft, +BS -Correct electrolyte abnormalities -Mobilize and he was given bowel regimen which is now stopped given the diarrhea which  is now improving   Failure to thrive, severe protein calorie malnutrition present on admission -Advance to full diet   Diarrhea -Happening after his diagnosis and unclear of the CT scan findings were real artifact but GI still planning for a sigmoidoscopy prior to discharge to rule out any problems there -Given that this is improving GI will hold off for now and have signed off the case until his acute issues have improved  DVT prophylaxis: SCDs Start: 10/13/2022 1928    Code Status: DNR Family Communication: No family present at bedside   Disposition Plan:  Level of care: Progressive Status is: Inpatient Remains inpatient appropriate because: Needs further clinical improvement and clearance by specialists    Consultants:  PCCM transfer Vascular  surgery Gastroenterology Medical oncology Hand surgery -I requested a consult from [name] however he stated that he wants the vascular surgeon to call him for a consultation; Vascular has informed [name] and hand surgery still awaiting evaluation  Procedures:  As delineated as above  Antimicrobials:  Anti-infectives (From admission, onward)    Start     Dose/Rate Route Frequency Ordered Stop   10/04/22 0900  fluconazole (DIFLUCAN) IVPB 200 mg        200 mg 100 mL/hr over 60 Minutes Intravenous Every 24 hours 10/04/22 0719     10/03/22 1400  ceFAZolin (ANCEF) IVPB 2g/100 mL premix        2 g 200 mL/hr over 30 Minutes Intravenous Every 8 hours 10/03/22 1025 10/08/22 2359   10/23/2022 1800  vancomycin (VANCOCIN) IVPB 1000 mg/200 mL premix  Status:  Discontinued        1,000 mg 200 mL/hr over 60 Minutes Intravenous Every 24 hours 10/08/2022 1823 10/21/2022 0801   10/24/2022 1000  fluconazole (DIFLUCAN) tablet 100 mg  Status:  Discontinued        100 mg Oral Daily 10/11/2022 1450 09/30/2022 1505   10/13/2022 0100  ceFEPIme (MAXIPIME) 2 g in sodium chloride 0.9 % 100 mL IVPB  Status:  Discontinued        2 g 200 mL/hr over 30 Minutes Intravenous Every 12 hours 10/08/2022 1823 10/03/22 1025   10/27/2022 2200  metroNIDAZOLE (FLAGYL) IVPB 500 mg  Status:  Discontinued        500 mg 100 mL/hr over 60 Minutes Intravenous Every 12 hours 10/05/2022 1823 10/03/22 1025   10/05/2022 1730  vancomycin (VANCOREADY) IVPB 1500 mg/300 mL        1,500 mg 150 mL/hr over 120 Minutes Intravenous  Once 10/03/2022 1642 10/23/2022 1951   10/25/2022 1600  piperacillin-tazobactam (ZOSYN) IVPB 3.375 g  Status:  Discontinued        3.375 g 12.5 mL/hr over 240 Minutes Intravenous Every 8 hours 09/27/2022 1451 10/13/2022 1821   10/23/2022 1500  ampicillin-sulbactam (UNASYN) 1.5 g in sodium chloride 0.9 % 100 mL IVPB  Status:  Discontinued        1.5 g 200 mL/hr over 30 Minutes Intravenous Every 8 hours 10/03/2022 1449 10/05/2022 1451    09/27/2022 1500  fluconazole (DIFLUCAN) tablet 200 mg  Status:  Discontinued        200 mg Oral  Once 10/12/2022 1450 10/20/2022 1505   10/04/2022 1300  ampicillin-sulbactam (UNASYN) 1.5 g in sodium chloride 0.9 % 100 mL IVPB        1.5 g 200 mL/hr over 30 Minutes Intravenous To Endoscopy 10/10/2022 1244 10/25/2022 1616       Subjective: Seen and examined at bedside and he was  doing okay.  Continues to be okay and complained of some pain.  No nausea or vomiting.  No chest pain or shortness of breath.  No other concerns or complaints at this time and awaiting hand surgery evaluation.  Objective: Vitals:   10/07/22 1600 10/07/22 1700 10/07/22 1800 10/07/22 1900  BP:  138/85 138/88 (!) 140/83  Pulse: 65 75 86 71  Resp: '11 18 20 15  '$ Temp: 97.7 F (36.5 C)     TempSrc: Oral     SpO2: 99% 98% 99% 100%  Weight:      Height:        Intake/Output Summary (Last 24 hours) at 10/07/2022 2000 Last data filed at 10/07/2022 1855 Gross per 24 hour  Intake 1816.81 ml  Output 200 ml  Net 1616.81 ml   Filed Weights   09/30/2022 0500 10/05/2022 0317 10/07/22 0500  Weight: 63.2 kg 63.5 kg 66.9 kg   Examination: Physical Exam:  Constitutional: Thin cachectic chronically ill-appearing older appearing than his stated age African-American male currently no acute distress Respiratory: Clear to auscultation bilaterally, no wheezing, rales, rhonchi or crackles. Normal respiratory effort and patient is not tachypenic. No accessory muscle use.  Cardiovascular: RRR, no murmurs / rubs / gallops. S1 and S2 auscultated. No extremity edema. 2+ pedal pulses. No carotid bruits.  Abdomen: Soft, non-tender, non-distended. Bowel sounds positive.  GU: Deferred. Musculoskeletal: No clubbing / cyanosis of digits/nails. No joint deformity upper and lower extremities.  Skin: Left hand has some discoloration and swelling Neurologic: CN 2-12 grossly intact with no focal deficits.  Romberg sign and cerebellar reflexes not assessed.   Psychiatric: Normal judgment and insight. Alert and oriented x 3. Normal mood and appropriate affect.   Data Reviewed: I have personally reviewed following labs and imaging studies  CBC: Recent Labs  Lab 09/27/2022 0428 10/03/22 0352 10/04/22 0404 10/05/22 0201 10/18/2022 0315 10/07/22 0355  WBC 25.0* 17.5* 19.0* 14.4* 12.2* 13.5*  NEUTROABS 22.4* 15.8* 18.8* 12.5*  --  11.1*  HGB 9.6* 9.6* 9.5* 10.7* 10.4* 9.9*  HCT 28.0* 27.2* 27.0* 31.3* 30.6* 29.6*  MCV 97.6 93.5 94.1 95.7 97.1 98.0  PLT 145* PLATELET CLUMPS NOTED ON SMEAR, UNABLE TO ESTIMATE 69* 54* 62* 84*   Basic Metabolic Panel: Recent Labs  Lab 10/05/2022 0529 10/03/2022 0841 10/17/2022 0428 10/03/22 0352 10/04/22 0404 10/05/22 0201 10/08/2022 0315 10/07/2022 1800 10/07/22 0355  NA 136   < > 134* 134* 135 136 134*  --  136  K 2.7*   < > 4.1 3.2* 2.9* 3.1* 3.3*  --  3.2*  CL 105   < > 105 108 107 109 107  --  110  CO2 19*   < > 14* 17* 20* 18* 18*  --  19*  GLUCOSE 51*   < > 215* 157* 166* 145* 153*  --  143*  BUN 10   < > 20 33* 26* 21 17  --  14  CREATININE 1.02   < > 1.44* 1.28* 1.01 0.93 0.90  --  0.91  CALCIUM 8.2*   < > 7.4* 7.1* 7.6* 7.7* 7.4*  --  7.4*  MG 1.5*  --  1.9  --   --  1.6* 1.9  --  1.9  PHOS 3.0  --  4.7*  --   --   --  1.1* 2.4* 2.0*   < > = values in this interval not displayed.   GFR: Estimated Creatinine Clearance: 74.5 mL/min (by C-G formula based on SCr of  0.91 mg/dL). Liver Function Tests: Recent Labs  Lab 09/30/2022 0428 10/03/22 0352 10/04/22 0404 10/05/22 0201 10/07/22 0355  AST 420* 204* 128* 91* 53*  ALT 117* 110* 86* 54* 24  ALKPHOS 795* 648* 529* 462* 394*  BILITOT 12.4* 9.0* 7.7* 6.6* 4.9*  PROT 5.1* 4.8* 4.7* 4.7* 4.6*  ALBUMIN 1.9* 1.8* 1.6* 1.6* 1.8*   Recent Labs  Lab 10/23/2022 0428  LIPASE 34   No results for input(s): "AMMONIA" in the last 168 hours. Coagulation Profile: No results for input(s): "INR", "PROTIME" in the last 168 hours. Cardiac Enzymes: No results  for input(s): "CKTOTAL", "CKMB", "CKMBINDEX", "TROPONINI" in the last 168 hours. BNP (last 3 results) Recent Labs    09/22/22 1141  PROBNP 835.0*   HbA1C: No results for input(s): "HGBA1C" in the last 72 hours. CBG: Recent Labs  Lab 10/07/22 0351 10/07/22 0757 10/07/22 1156 10/07/22 1633 10/07/22 1939  GLUCAP 153* 147* 241* 245* 168*   Lipid Profile: Recent Labs    10/05/22 0201 10/07/22 0355  CHOL  --  204*  HDL  --  19*  LDLCALC  --  158*  TRIG 141 134  CHOLHDL  --  10.7   Thyroid Function Tests: No results for input(s): "TSH", "T4TOTAL", "FREET4", "T3FREE", "THYROIDAB" in the last 72 hours. Anemia Panel: No results for input(s): "VITAMINB12", "FOLATE", "FERRITIN", "TIBC", "IRON", "RETICCTPCT" in the last 72 hours. Sepsis Labs: Recent Labs  Lab 10/10/2022 1605 10/05/2022 0438 10/03/22 0352  LATICACIDVEN 6.6* 3.5* 1.4    Recent Results (from the past 240 hour(s))  C Difficile Quick Screen w PCR reflex     Status: None   Collection Time: 09/27/2022  6:55 AM   Specimen: STOOL  Result Value Ref Range Status   C Diff antigen NEGATIVE NEGATIVE Final   C Diff toxin NEGATIVE NEGATIVE Final   C Diff interpretation No C. difficile detected.  Final    Comment: Performed at [facility], [place] [address], [place], [place] [address]  Gastrointestinal Panel by PCR , Stool     Status: None   Collection Time: 10/21/2022  6:55 AM  Result Value Ref Range Status   Campylobacter species NOT DETECTED NOT DETECTED Final   Plesimonas shigelloides NOT DETECTED NOT DETECTED Final   Salmonella species NOT DETECTED NOT DETECTED Final   Yersinia enterocolitica NOT DETECTED NOT DETECTED Final   Vibrio species NOT DETECTED NOT DETECTED Final   Vibrio cholerae NOT DETECTED NOT DETECTED Final   Enteroaggregative E coli (EAEC) NOT DETECTED NOT DETECTED Final   Enteropathogenic E coli (EPEC) NOT DETECTED NOT DETECTED Final   Enterotoxigenic E coli (ETEC) NOT DETECTED NOT  DETECTED Final   Shiga like toxin producing E coli (STEC) NOT DETECTED NOT DETECTED Final   Shigella/Enteroinvasive E coli (EIEC) NOT DETECTED NOT DETECTED Final   Cryptosporidium NOT DETECTED NOT DETECTED Final   Cyclospora cayetanensis NOT DETECTED NOT DETECTED Final   Entamoeba histolytica NOT DETECTED NOT DETECTED Final   Giardia lamblia NOT DETECTED NOT DETECTED Final   Adenovirus F40/41 NOT DETECTED NOT DETECTED Final   Astrovirus NOT DETECTED NOT DETECTED Final   Norovirus GI/GII NOT DETECTED NOT DETECTED Final   Rotavirus A NOT DETECTED NOT DETECTED Final   Sapovirus (I, II, IV, and V) NOT DETECTED NOT DETECTED Final    Comment: Performed at [facility], [place] [place], [place] [address]  Culture, blood (Routine X 2) w Reflex to ID Panel     Status: Abnormal  Collection Time: 10/08/2022  8:41 AM   Specimen: BLOOD LEFT HAND  Result Value Ref Range Status   Specimen Description   Final    BLOOD LEFT HAND Performed at [place] [address] [place], [place] [address]    Special Requests   Final    BOTTLES DRAWN AEROBIC ONLY Blood Culture adequate volume Performed at [place] [facility] [address] [place], [place] [address]    Culture  Setup Time   Final    GRAM NEGATIVE RODS AEROBIC BOTTLE ONLY Organism ID to follow CRITICAL RESULT CALLED TO, READ BACK BY AND VERIFIED WITH: M LILLISTON,PHARMD'@0012'$  10/24/2022 [place] Performed at [facility], [place] [address] [place], [place] [address]    Culture KLEBSIELLA PNEUMONIAE (A)  Final   Report Status 10/03/2022 FINAL  Final   Organism ID, Bacteria KLEBSIELLA PNEUMONIAE  Final      Susceptibility   Klebsiella pneumoniae - MIC*    AMPICILLIN RESISTANT Resistant     CEFAZOLIN <=4 SENSITIVE Sensitive     CEFEPIME <=0.12 SENSITIVE Sensitive     CEFTAZIDIME <=1 SENSITIVE Sensitive     CEFTRIAXONE <=0.25 SENSITIVE Sensitive     CIPROFLOXACIN <=0.25 SENSITIVE Sensitive      GENTAMICIN <=1 SENSITIVE Sensitive     IMIPENEM <=0.25 SENSITIVE Sensitive     TRIMETH/SULFA <=20 SENSITIVE Sensitive     AMPICILLIN/SULBACTAM <=2 SENSITIVE Sensitive     PIP/TAZO <=4 SENSITIVE Sensitive     * KLEBSIELLA PNEUMONIAE  Culture, blood (Routine X 2) w Reflex to ID Panel     Status: Abnormal   Collection Time: 10/19/2022  8:41 AM   Specimen: BLOOD RIGHT ARM  Result Value Ref Range Status   Specimen Description   Final    BLOOD RIGHT ARM Performed at [facility] [address], [place], [place] [address]    Special Requests   Final    BOTTLES DRAWN AEROBIC ONLY Blood Culture adequate volume Performed at [place] [address], [place], [place] [address]    Culture  Setup Time   Final    GRAM NEGATIVE RODS BOTTLES DRAWN AEROBIC ONLY CRITICAL VALUE NOTED.  VALUE IS CONSISTENT WITH PREVIOUSLY REPORTED AND CALLED VALUE.    Culture (A)  Final    KLEBSIELLA PNEUMONIAE SUSCEPTIBILITIES PERFORMED ON PREVIOUS CULTURE WITHIN THE LAST 5 DAYS. Performed at [facility], [place] [address] [place], [place] [address]    Report Status 10/03/2022 FINAL  Final  Blood Culture ID Panel (Reflexed)     Status: Abnormal   Collection Time: 10/04/2022  8:41 AM  Result Value Ref Range Status   Enterococcus faecalis NOT DETECTED NOT DETECTED Final   Enterococcus Faecium NOT DETECTED NOT DETECTED Final   Listeria monocytogenes NOT DETECTED NOT DETECTED Final   Staphylococcus species NOT DETECTED NOT DETECTED Final   Staphylococcus aureus (BCID) NOT DETECTED NOT DETECTED Final   Staphylococcus epidermidis NOT DETECTED NOT DETECTED Final   Staphylococcus lugdunensis NOT DETECTED NOT DETECTED Final   Streptococcus species NOT DETECTED NOT DETECTED Final   Streptococcus agalactiae NOT DETECTED NOT DETECTED Final   Streptococcus pneumoniae NOT DETECTED NOT DETECTED Final   Streptococcus pyogenes NOT DETECTED NOT DETECTED Final    A.calcoaceticus-baumannii NOT DETECTED NOT DETECTED Final   Bacteroides fragilis NOT DETECTED NOT DETECTED Final   Enterobacterales DETECTED (A) NOT DETECTED Final    Comment: Enterobacterales represent a large order of gram negative bacteria, not a single organism. CRITICAL  RESULT CALLED TO, READ BACK BY AND VERIFIED WITH: M LILLISTON,PHARMD'@0012'$  10/23/2022 [place]    Enterobacter cloacae complex NOT DETECTED NOT DETECTED Final   Escherichia coli NOT DETECTED NOT DETECTED Final   Klebsiella aerogenes NOT DETECTED NOT DETECTED Final   Klebsiella oxytoca NOT DETECTED NOT DETECTED Final   Klebsiella pneumoniae DETECTED (A) NOT DETECTED Final    Comment: CRITICAL RESULT CALLED TO, READ BACK BY AND VERIFIED WITH: M LILLISTON,PHARMD'@0012'$  09/30/2022 [place]    Proteus species NOT DETECTED NOT DETECTED Final   Salmonella species NOT DETECTED NOT DETECTED Final   Serratia marcescens NOT DETECTED NOT DETECTED Final   Haemophilus influenzae NOT DETECTED NOT DETECTED Final   Neisseria meningitidis NOT DETECTED NOT DETECTED Final   Pseudomonas aeruginosa NOT DETECTED NOT DETECTED Final   Stenotrophomonas maltophilia NOT DETECTED NOT DETECTED Final   Candida albicans NOT DETECTED NOT DETECTED Final   Candida auris NOT DETECTED NOT DETECTED Final   Candida glabrata NOT DETECTED NOT DETECTED Final   Candida krusei NOT DETECTED NOT DETECTED Final   Candida parapsilosis NOT DETECTED NOT DETECTED Final   Candida tropicalis NOT DETECTED NOT DETECTED Final   Cryptococcus neoformans/gattii NOT DETECTED NOT DETECTED Final   CTX-M ESBL NOT DETECTED NOT DETECTED Final   Carbapenem resistance IMP NOT DETECTED NOT DETECTED Final   Carbapenem resistance KPC NOT DETECTED NOT DETECTED Final   Carbapenem resistance NDM NOT DETECTED NOT DETECTED Final   Carbapenem resist OXA 48 LIKE NOT DETECTED NOT DETECTED Final   Carbapenem resistance VIM NOT DETECTED NOT DETECTED Final    Comment: Performed at [facility],  [address], [place], [place] [address]  MRSA Next Gen by PCR, Nasal     Status: None   Collection Time: 10/03/2022  3:59 PM   Specimen: Nasal Mucosa; Nasal Swab  Result Value Ref Range Status   MRSA by PCR Next Gen NOT DETECTED NOT DETECTED Final    Comment: (NOTE) The GeneXpert MRSA Assay (FDA approved for NASAL specimens only), is one component of a comprehensive MRSA colonization surveillance program. It is not intended to diagnose MRSA infection nor to guide or monitor treatment for MRSA infections. Test performance is not FDA approved in patients less than 52 years old. Performed at [facility], [place] [address], [place], [place] [address]   Urine Culture     Status: None   Collection Time: 10/16/2022  4:50 PM   Specimen: Urine, Catheterized  Result Value Ref Range Status   Specimen Description   Final    URINE, CATHETERIZED Performed at [place] [address] [place], [place] [address]    Special Requests   Final    NONE Performed at [facility], [place] [address], [place], [place] [address]    Culture   Final    NO GROWTH Performed at [facility], [place] [address], [place],  [address]    Report Status 10/03/2022 FINAL  Final     Radiology Studies: PERIPHERAL VASCULAR CATHETERIZATION  Result Date: 10/22/2022 Images from the original result were not included.   Patient name: [name] [name] MRN: [id number]        DOB: 04/22/1956        Sex: male  09/30/2022 Pre-operative Diagnosis: Left upper extremity hand ischemia status post left radial arterial line Post-operative diagnosis:  Same Surgeon:  Broadus John, MD Procedure Performed: 1.  Ultrasound-guided micropuncture access of the right common femoral artery in retrograde fashion 2.  Arch angiogram 3.  Third order cannulation, left upper extremity angiogram 4.  Moderate sedation 43 minutes 5.  Contrast volume 150 mL 6.  Device assisted closure-Mynx    Indications: This is a 67 y.o. male with what appears to be an ischemic left hand status post radial artery cannulation for hemodynamic monitoring now with occluded radial artery by recent duplex and very weak flow into the hand by ulnar artery.  This is being ongoing for a few days at this point.  Unfortunately patient with decreasing platelets per primary team not a candidate for anticoagulation at this time.  Will plan for angiography from femoral approach tomorrow in the Cath Lab at [facility].  He will be scheduled for the last case the day and will need to be transferred over for this procedure and can be sent back afterwards as long as he is stable for transfer.  I discussed with the patient and his daughter at the bedside that he is at high risk of digit loss with or without revascularization possibly could lose more than just his digits up to and including his hand.  Will likely require hand surgery involvement in his case.    Findings: Arch angiogram demonstrates normal first-order vessels without stenosis. Left upper extremity: High bifurcation of the radial artery from the proximal brachial artery.  Sluggish flow in the radial artery to the level of the antecubital fossa where ends in collaterals.  The brachial artery continues giving off the ulnar and interosseous arteries. These demonstrate normal flow.  The interosseous artery reconstitutes the radial artery at the level of the wrist.  The palmar arch is intact with digital arteries of the fourth and fifth digits.  There are no branches from the palmar arch to the first second and third digits.              Procedure:  The patient was identified in the holding area and taken to room 8.  The patient was then placed supine on the table and prepped and draped in the usual sterile fashion.  A time out was called.  Ultrasound was used to evaluate the right common femoral artery.  It was patent .  A digital ultrasound image was acquired.  A micropuncture  needle was used to access the right common femoral artery under ultrasound guidance.  An 018 wire was advanced without resistance and a micropuncture sheath was placed.  The 018 wire was removed and a benson wire was placed.  The micropuncture sheath was exchanged for a 5 french sheath.  The patient was heparinized, and a pigtail flush catheter was placed in the ascending aorta for arch angiogram.  See results above.  Next, a Glidewire advantage wire was used to select the left subclavian artery.  The wire was exchanged for a cathter that was parked in the left axillary artery.  See results above.  In an effort to assess whether recannulization of the radial artery would improve distal flow, a wire was advanced through the radial artery into the distal patent portion located at the wrist.  Follow-up angiography demonstrated no improvement hand fifth digit perfusion.  Being that perfusion was unchanged with angiography from the distal radial artery elected to end the case.  Arteriotomy was managed with a minx device.  Impression: High bifurcation of the radial artery with occlusion from the antecubital fossa to the wrist.  There is reconstitution of the radial artery via the interosseous artery.  In the hand, thrombosis of digital arteries  in the first, second and third digits.  No flow in these digits. Palmar arch intact.  Recommend hand surgery involvement as patient will require future amputation.   Cassandria Santee, MD [facility] Office: [phone]    Scheduled Meds:  Chlorhexidine Gluconate Cloth  6 each Topical Daily   digoxin  0.125 mg Oral Daily   feeding supplement  1 Container Oral TID BM   hydrocortisone sod succinate (SOLU-CORTEF) inj  100 mg Intravenous Q8H   insulin aspart  0-9 Units Subcutaneous Q4H   [START ON 10/08/2022] multivitamin with minerals  1 tablet Oral Daily   sodium chloride flush  3 mL Intravenous Q12H   Continuous Infusions:  sodium chloride  Stopped (10/07/22 1806)    ceFAZolin (ANCEF) IV Stopped (10/07/22 1541)   fluconazole (DIFLUCAN) IV Stopped (10/07/22 1024)   heparin 1,200 Units/hr (10/07/22 1947)    LOS: 8 days   Raiford Noble, DO [facility] Available via Epic secure chat 7am-7pm After these hours, please refer to coverage provider listed on [email or web address] 10/07/2022, 8:00 PM

## 2022-10-08 DIAGNOSIS — C241 Malignant neoplasm of ampulla of Vater: Secondary | ICD-10-CM | POA: Diagnosis not present

## 2022-10-08 DIAGNOSIS — K921 Melena: Secondary | ICD-10-CM | POA: Diagnosis not present

## 2022-10-08 DIAGNOSIS — C221 Intrahepatic bile duct carcinoma: Secondary | ICD-10-CM

## 2022-10-08 DIAGNOSIS — I428 Other cardiomyopathies: Secondary | ICD-10-CM | POA: Diagnosis not present

## 2022-10-08 DIAGNOSIS — E876 Hypokalemia: Secondary | ICD-10-CM | POA: Diagnosis not present

## 2022-10-08 DIAGNOSIS — R627 Adult failure to thrive: Secondary | ICD-10-CM | POA: Diagnosis not present

## 2022-10-08 DIAGNOSIS — I5022 Chronic systolic (congestive) heart failure: Secondary | ICD-10-CM | POA: Diagnosis not present

## 2022-10-08 DIAGNOSIS — K529 Noninfective gastroenteritis and colitis, unspecified: Secondary | ICD-10-CM | POA: Diagnosis not present

## 2022-10-08 LAB — CBC WITH DIFFERENTIAL/PLATELET
Abs Immature Granulocytes: 0.37 10*3/uL — ABNORMAL HIGH (ref 0.00–0.07)
Abs Immature Granulocytes: 0.39 10*3/uL — ABNORMAL HIGH (ref 0.00–0.07)
Basophils Absolute: 0 10*3/uL (ref 0.0–0.1)
Basophils Absolute: 0.1 10*3/uL (ref 0.0–0.1)
Basophils Relative: 0 %
Basophils Relative: 0 %
Eosinophils Absolute: 0 10*3/uL (ref 0.0–0.5)
Eosinophils Absolute: 0 10*3/uL (ref 0.0–0.5)
Eosinophils Relative: 0 %
Eosinophils Relative: 0 %
HCT: 28.1 % — ABNORMAL LOW (ref 39.0–52.0)
HCT: 33.6 % — ABNORMAL LOW (ref 39.0–52.0)
Hemoglobin: 9.5 g/dL — ABNORMAL LOW (ref 13.0–17.0)
Hemoglobin: 11.2 g/dL — ABNORMAL LOW (ref 13.0–17.0)
Immature Granulocytes: 3 %
Immature Granulocytes: 2 %
Lymphocytes Relative: 10 %
Lymphocytes Relative: 9 %
Lymphs Abs: 1.5 10*3/uL (ref 0.7–4.0)
Lymphs Abs: 1.6 10*3/uL (ref 0.7–4.0)
MCH: 32.5 pg (ref 26.0–34.0)
MCH: 33 pg (ref 26.0–34.0)
MCHC: 33.8 g/dL (ref 30.0–36.0)
MCHC: 33.3 g/dL (ref 30.0–36.0)
MCV: 96.2 fL (ref 80.0–100.0)
MCV: 99.1 fL (ref 80.0–100.0)
Monocytes Absolute: 0.7 10*3/uL (ref 0.1–1.0)
Monocytes Absolute: 0.6 10*3/uL (ref 0.1–1.0)
Monocytes Relative: 5 %
Monocytes Relative: 3 %
Neutro Abs: 11.9 10*3/uL — ABNORMAL HIGH (ref 1.7–7.7)
Neutro Abs: 14.5 10*3/uL — ABNORMAL HIGH (ref 1.7–7.7)
Neutrophils Relative %: 82 %
Neutrophils Relative %: 86 %
Platelets: 117 10*3/uL — ABNORMAL LOW (ref 150–400)
Platelets: 140 10*3/uL — ABNORMAL LOW (ref 150–400)
RBC: 2.92 MIL/uL — ABNORMAL LOW (ref 4.22–5.81)
RBC: 3.39 MIL/uL — ABNORMAL LOW (ref 4.22–5.81)
RDW: 14.7 % (ref 11.5–15.5)
RDW: 15.2 % (ref 11.5–15.5)
WBC: 14.5 10*3/uL — ABNORMAL HIGH (ref 4.0–10.5)
WBC: 17.1 10*3/uL — ABNORMAL HIGH (ref 4.0–10.5)
nRBC: 0.4 % — ABNORMAL HIGH (ref 0.0–0.2)
nRBC: 0.4 % — ABNORMAL HIGH (ref 0.0–0.2)

## 2022-10-08 LAB — GLUCOSE, CAPILLARY
Glucose-Capillary: 133 mg/dL — ABNORMAL HIGH (ref 70–99)
Glucose-Capillary: 131 mg/dL — ABNORMAL HIGH (ref 70–99)
Glucose-Capillary: 192 mg/dL — ABNORMAL HIGH (ref 70–99)
Glucose-Capillary: 197 mg/dL — ABNORMAL HIGH (ref 70–99)
Glucose-Capillary: 237 mg/dL — ABNORMAL HIGH (ref 70–99)

## 2022-10-08 LAB — COMPREHENSIVE METABOLIC PANEL
ALT: 22 U/L (ref 0–44)
AST: 56 U/L — ABNORMAL HIGH (ref 15–41)
Albumin: 1.8 g/dL — ABNORMAL LOW (ref 3.5–5.0)
Alkaline Phosphatase: 346 U/L — ABNORMAL HIGH (ref 38–126)
Anion gap: 7 (ref 5–15)
BUN: 12 mg/dL (ref 8–23)
CO2: 18 mmol/L — ABNORMAL LOW (ref 22–32)
Calcium: 7.3 mg/dL — ABNORMAL LOW (ref 8.9–10.3)
Chloride: 107 mmol/L (ref 98–111)
Creatinine, Ser: 0.81 mg/dL (ref 0.61–1.24)
GFR, Estimated: >60 mL/min (ref >60)
Glucose, Bld: 124 mg/dL — ABNORMAL HIGH (ref 70–99)
Potassium: 3.2 mmol/L — ABNORMAL LOW (ref 3.5–5.1)
Sodium: 132 mmol/L — ABNORMAL LOW (ref 135–145)
Total Bilirubin: 4.7 mg/dL — ABNORMAL HIGH (ref 0.3–1.2)
Total Protein: 4.5 g/dL — ABNORMAL LOW (ref 6.5–8.1)

## 2022-10-08 LAB — PHOSPHORUS: Phosphorus: 2 mg/dL — ABNORMAL LOW (ref 2.5–4.6)

## 2022-10-08 LAB — VITAMIN B12: Vitamin B-12: 673 pg/mL (ref 180–914)

## 2022-10-08 LAB — HEPARIN LEVEL (UNFRACTIONATED): Heparin Unfractionated: 0.6 IU/mL (ref 0.30–0.70)

## 2022-10-08 LAB — MAGNESIUM: Magnesium: 1.9 mg/dL (ref 1.7–2.4)

## 2022-10-08 MED ORDER — POTASSIUM CHLORIDE CRYS ER 20 MEQ PO TBCR
40.0000 meq | EXTENDED_RELEASE_TABLET | Freq: Two times a day (BID) | ORAL | Status: DC
  Administered 2022-10-08: 40 meq via ORAL
  Filled 2022-10-08: qty 2

## 2022-10-08 MED ORDER — POTASSIUM & SODIUM PHOSPHATES 280-160-250 MG PO PACK
1.0000 | PACK | Freq: Three times a day (TID) | ORAL | Status: DC
  Administered 2022-10-08 (×3): 1 via ORAL
  Filled 2022-10-08 (×5): qty 1

## 2022-10-08 MED ORDER — POTASSIUM CHLORIDE 20 MEQ PO PACK
40.0000 meq | PACK | Freq: Once | ORAL | Status: AC
  Administered 2022-10-08: 40 meq via ORAL
  Filled 2022-10-08: qty 2

## 2022-10-08 NOTE — TOC Initial Note (Signed)
Transition of Care [facility]) - Initial/Assessment Note    Patient Details  Name: [name] [name] MRN: [id number] Date of Birth: Dec 23, 1955  Transition of Care [facility]) CM/SW Contact:    Henrietta Dine, RN Phone Number: 10/08/2022, 4:16 PM  Clinical Narrative:                 [facility] consult for SNF; spoke w/ pt in room and he agrees w/ PT recc of SNF; the pt says he is from home alone; he has transportation; the pt denies IPV, problems paying utilities and food insecurity; the pt says he has reading glasses, upper dentures, rolling walker, and cane; the pt says he does not have [facility] services or home oxygen; he says he can feed himself and he is continent of bowel/bladder; he also says he has no problems w/ his skin; explained SNF process w/ pt and he would like his POC dtr Arline Asp to be notified; pt would also like for search to be limited to [facility]; called pt's dtr at [phone] and also explained SNF process; she verbalized understanding; will complete FL2 and send out for bed offers; ins auth needed.  Expected Discharge Plan: Skilled Nursing Facility Barriers to Discharge: Continued Medical Work up   Patient Goals and CMS Choice            Expected Discharge Plan and Services   Discharge Planning Services: CM Consult   Living arrangements for the past 2 months: Apartment                                      Prior Living Arrangements/Services Living arrangements for the past 2 months: Apartment Lives with:: Self Patient language and need for interpreter reviewed:: Yes Do you feel safe going back to the place where you live?: Yes      Need for Family Participation in Patient Care: Yes (Comment) Care giver support system in place?: Yes (comment) Current home services: DME (walker, cane) Criminal Activity/Legal Involvement Pertinent to Current Situation/Hospitalization: No - Comment as needed  Activities of Daily Living Home Assistive Devices/Equipment:  None ADL Screening (condition at time of admission) Patient's cognitive ability adequate to safely complete daily activities?: Yes Is the patient deaf or have difficulty hearing?: No Does the patient have difficulty seeing, even when wearing glasses/contacts?: No Does the patient have difficulty concentrating, remembering, or making decisions?: No Patient able to express need for assistance with ADLs?: No Does the patient have difficulty dressing or bathing?: No Independently performs ADLs?: Yes (appropriate for developmental age) Does the patient have difficulty walking or climbing stairs?: No Weakness of Legs: Both Weakness of Arms/Hands: None  Permission Sought/Granted Permission sought to share information with : Case Manager Permission granted to share information with : Yes, Verbal Permission Granted  Share Information with NAME: Lenor Coffin, RN, CM           Emotional Assessment Appearance:: Appears stated age Attitude/Demeanor/Rapport: Gracious Affect (typically observed): Accepting Orientation: : Oriented to Self, Oriented to Place, Oriented to  Time Alcohol / Substance Use: Not Applicable Psych Involvement: No (comment)  Admission diagnosis:  Hypokalemia [E87.6] Hyperbilirubinemia [E80.6] 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   Biliary obstruction due to malignant neoplasm ([place]) 09/18/2022   Abnormal CT scan, colon 09/18/2022   Hyperbilirubinemia 09/16/2022   Hypokalemia 09/16/2022   Common bile duct dilatation 09/16/2022   ICD (implantable cardioverter-defibrillator) in place 09/16/2022   AKI (acute kidney injury) ([place]) 09/16/2022   NICM (nonischemic cardiomyopathy) ([place]) 07/09/2015   Nonischemic cardiomyopathy ([place]) 06/30/2015   Heavy smoker (more than 20 cigarettes per day) 05/27/2015   PAD (peripheral artery disease) ([place]) 70/26/3785   Chronic systolic CHF (congestive heart failure) ([place]) 01/31/2015   Pedal edema 01/27/2015   Blood in stool 11/06/2013   Polycythemia, secondary 06/18/2013   Nicotine dependence 06/18/2013   Essential hypertension, benign 06/18/2013   Dyslipidemia 06/18/2013   Low back pain 06/18/2013   Irregular heart rhythm 06/18/2013   PCP:  Terrilyn Saver, NP Pharmacy:   [facility] [place], [address] [place] [address] Phone: [phone] Fax: [facility] [place] [address] Phone: [phone] Fax: [phone]     Social Determinants of Health (SDOH) Social History: SDOH Screenings   Food Insecurity: Food Insecurity Present (09/30/2022)  Housing: Low Risk  (09/30/2022)  Transportation Needs: No Transportation Needs (09/30/2022)  Utilities: Not At Risk (09/30/2022)  Depression (PHQ2-9): Low Risk  (09/22/2022)  Tobacco Use: High Risk (10/07/2022)   SDOH Interventions: Food Insecurity Interventions: Intervention Not Indicated, Inpatient TOC   Readmission Risk Interventions    10/18/2022    8:27 AM  Readmission Risk Prevention Plan  Transportation Screening Complete  PCP or  Specialist Appt within 5-7 Days Complete  Home Care Screening Complete  Medication Review (RN CM) Complete

## 2022-10-08 NOTE — Progress Notes (Addendum)
ANTICOAGULATION CONSULT NOTE   Pharmacy Consult for heparin Indication: occluded left radial artery; ischemic left hand  No Known Allergies  Patient Measurements: Height: '6\' 1"'$  (185.4 cm) Weight: 65.3 kg (143 lb 15.4 oz) IBW/kg (Calculated) : 79.9 Heparin Dosing Weight: 63 kg  Vital Signs: Temp: 97.9 F (36.6 C) (01/12 0354) Temp Source: Oral (01/12 0354) BP: 135/91 (01/12 0600) Pulse Rate: 67 (01/12 0600)  Labs: Recent Labs    10/25/2022 0315 10/19/2022 0315 10/07/22 0355 10/07/22 1121 10/07/22 1805 10/08/22 0328  HGB 10.4*  --  9.9*  --   --  9.5*  HCT 30.6*  --  29.6*  --   --  28.1*  PLT 62*  --  84*  --   --  117*  APTT  --   --  56*  --   --   --   HEPARINUNFRC  --    < > 0.11* 0.35 0.42 0.60  CREATININE 0.90  --  0.91  --   --  0.81   < > = values in this interval not displayed.     Estimated Creatinine Clearance: 81.7 mL/min (by C-G formula based on SCr of 0.81 mg/dL).   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  He underwent a repeat ERCP on 10/24/2022 that showed esophageal plaques, hiatal hernia, pus in the major papilla, and one partially occluded stent from the biliary tree. Swelling noted in his left hand on 10/05/22. Upper extremity US performed on 10/05/22 showed occluded left radial artery and acute left superficial vein thrombosis.  Pharmacy has been consulted to dose heparin on 10/16/2022 for left occluded radial artery. Per Dr. Marin Olp, no bolus for heparin drip d/t low plts.  - 1/10 arteriography: Sluggish flow in the radial artery. Occlusion from the antecubital fossa to the wrist. Thrombosis of digital arteries in the first, second and third digits - No flow in these digits.  VVS recom amputation.  Today, 10/08/2022: - heparin level is therapeutic at  0.60 - hgb stable, plts low but trending to with 117K this morning - no bleeding documented  Goal of Therapy:  Heparin level 0.3-0.7 units/ml Monitor platelets by anticoagulation protocol: Yes   Plan:  - Continue heparin drip at 1200 units/hr - Daily heparin level - monitor for s/sx bleeding - If to proceed with amputation, please advise when heparin drip needs to be held prior to procedure.  Dia Sitter P 10/08/2022,7:24 AM

## 2022-10-08 NOTE — NC FL2 (Signed)
[place] LEVEL OF CARE FORM     IDENTIFICATION  Patient Name: [name] [name] Birthdate: [birth year] Sex: male Admission Date (Current Location): 10/05/2022  [facility] and [place] Number:  Herbalist and Address:  [facility],  [place], 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[place]) 10/13/2022   Elevated CEA 09/30/2022   Abnormal LFTs 10/18/2022   Severe sepsis with septic shock ([place]) 10/12/2022   Palliative care encounter 09/27/2022   Failure to thrive in adult 10/18/2022   Severe protein-calorie malnutrition ([place]) 10/05/2022   HFrEF (heart failure with reduced ejection fraction) ([place]) 10/19/2022   Goals of care, counseling/discussion 10/20/2022   Counseling and coordination of care 09/30/2022   Common bile duct (CBD) stricture 10/17/2022   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[place]) 10/14/2022   Biliary obstruction due to malignant neoplasm ([place]) 09/18/2022   Abnormal CT scan, colon 09/18/2022   Hyperbilirubinemia 09/16/2022   Hypokalemia 09/16/2022   Common bile duct dilatation 09/16/2022   ICD (implantable cardioverter-defibrillator) in place 09/16/2022   AKI (acute kidney injury) ([place]) 09/16/2022   NICM (nonischemic cardiomyopathy) ([place]) 07/09/2015   Nonischemic cardiomyopathy ([place]) 06/30/2015   Heavy smoker (more than 20 cigarettes per day) 05/27/2015   PAD (peripheral artery disease) ([place])  96/22/2979   Chronic systolic CHF (congestive heart failure) ([place]) 01/31/2015   Pedal edema 01/27/2015   Hematochezia 11/06/2013   Polycythemia, secondary 06/18/2013   Nicotine dependence 06/18/2013   Essential hypertension, benign 06/18/2013   Dyslipidemia 06/18/2013   Low back pain 06/18/2013   Irregular heart rhythm 06/18/2013    Orientation RESPIRATION BLADDER Height & Weight     Self, Time, Situation  Normal Continent Weight: 65.3 kg Height:  '6\' 1"'$  (185.4 cm)  BEHAVIORAL SYMPTOMS/MOOD NEUROLOGICAL BOWEL NUTRITION STATUS      Continent Diet (regular)  AMBULATORY STATUS COMMUNICATION OF NEEDS Skin   Limited Assist Verbally Other (Comment) (necrotic digits on left hand)                       Personal Care Assistance Level of Assistance  Bathing, Feeding, Dressing Bathing Assistance: Limited assistance Feeding assistance: Limited assistance Dressing Assistance: Limited assistance     Functional Limitations Info  Sight, Hearing, Speech Sight Info: Impaired (glasses) Hearing Info: Adequate Speech Info: Adequate    SPECIAL CARE FACTORS FREQUENCY  PT (By licensed PT), OT (By licensed OT)     PT Frequency: 5x/week OT Frequency: 5x/week            Contractures Contractures Info: Not present    Additional Factors Info  Code Status, Allergies Code Status Info: DNR Allergies Info: NKDA           Current Medications (10/08/2022):  This is the current hospital active medication list Current Facility-Administered Medications  Medication Dose Route Frequency Provider Last Rate Last Admin   0.9 %  sodium chloride infusion  250 mL Intravenous PRN Broadus John,  MD   Stopped at 10/08/22 1055   acetaminophen (TYLENOL) tablet 650 mg  650 mg Oral Q4H PRN Broadus John, MD       ceFAZolin (ANCEF) IVPB 2g/100 mL premix  2 g Intravenous Q8H Maryjane Hurter, MD 200 mL/hr at 10/08/22 1523 2 g at 10/08/22 1523   Chlorhexidine Gluconate Cloth 2 % PADS 6 each  6  each Topical Daily Mannam, Praveen, MD   6 each at 10/08/22 1523   digoxin (LANOXIN) tablet 0.125 mg  0.125 mg Oral Daily Zada Finders R, MD   0.125 mg at 10/08/22 1246   feeding supplement (BOOST / RESOURCE BREEZE) liquid 1 Container  1 Container Oral TID BM [place], Amy S, PA-C   1 Container at 10/07/22 1513   fluconazole (DIFLUCAN) IVPB 200 mg  200 mg Intravenous Q24H Volanda Napoleon, MD   Stopped at 10/08/22 1125   guaiFENesin-dextromethorphan (ROBITUSSIN DM) 100-10 MG/5ML syrup 5 mL  5 mL Oral Q4H PRN Raiford Noble [place], DO   5 mL at 10/08/22 0424   hydrALAZINE (APRESOLINE) injection 5 mg  5 mg Intravenous Q20 Min PRN Broadus [place], MD       hydrocortisone sodium succinate (SOLU-CORTEF) 100 MG injection 100 mg  100 mg Intravenous Q8H Mannam, Praveen, MD   100 mg at 10/08/22 0831   HYDROmorphone (DILAUDID) injection 1 mg  1 mg Intravenous Q2H PRN Volanda Napoleon, MD       insulin aspart (novoLOG) injection 0-9 Units  0-9 Units Subcutaneous Q4H Mannam, Praveen, MD   2 Units at 10/08/22 1246   labetalol (NORMODYNE) injection 10 mg  10 mg Intravenous Q10 min PRN Broadus John, MD       multivitamin with minerals tablet 1 tablet  1 tablet Oral Daily Raiford Noble [place], DO   1 tablet at 10/08/22 0831   ondansetron (ZOFRAN) injection 4 mg  4 mg Intravenous Q6H PRN Broadus John, MD       Oral care mouth rinse  15 mL Mouth Rinse PRN Maryjane Hurter, MD       potassium & sodium phosphates (PHOS-NAK) 280-160-250 MG packet 1 packet  1 packet Oral TID WC & HS Raiford Noble [place], DO   1 packet at 10/08/22 1246   potassium chloride SA (KLOR-CON M) CR tablet 40 mEq  40 mEq Oral BID Raiford Noble [place], DO   40 mEq at 10/08/22 1246   sodium chloride flush (NS) 0.9 % injection 3 mL  3 mL Intravenous Q12H Zada Finders R, MD   3 mL at 10/08/22 0800     Discharge Medications: Please see discharge summary for a list of discharge medications.  Relevant Imaging Results:  Relevant Lab  Results:   Additional Information SSN [id number]  Henrietta Dine, RN

## 2022-10-08 NOTE — Progress Notes (Signed)
PROGRESS NOTE    [name] [name]  JJH:[id number] DOB: July 31, 1956 DOA: 10/17/2022 PCP: Terrilyn Saver, NP   Brief Narrative:  [name] [name] is a 67 y.o. 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    Patient has a diagnosis of cholangiocarcinoma with a malignant biliary stricture status post stenting on 09/17/2022 treated with stent obstruction.  He is noted to have septic shock on admission and his WBC elevated to 38.7 on 10/01/2021.  He was given a dose of IV Unasyn and underwent repeat ERCP and stent replacement with findings of purulent discharge and cholangitis.  He was septic postprocedure with shock and PCCM was called for admission to the ICU.  He was admitted on 09/29/2021 and then had an ERCP on 10/01/2021 and transferred to the ICU with septic shock.  He is made DNR on 10/04/2021 after discussion with the family.  Repeat echocardiogram showed an EF of less than 20%.  He has swelling of her left upper extremity overnight and arterial duplex was ordered as well as a venous duplex.  Patient was subsequently weaned off of pressors from his septic shock.  The arterial duplex showed a left obstruction noted in the radial artery.  He was initiated on a heparin drip and the medical oncologist and vascular took him for an angiogram and he was found to have a high bifurcation of the radial artery with occlusion from the antecubital fossa to the wrist.  There is reconstitution of the radial artery via the interosseous artery and in the hand thrombosis of the digital arteries in the first second and third digits.  There is no flow in the digits and the palmar arch was intact.  Vascular surgery recommended hand surgery involvement and I called [name] who requested that  vascular surgery call him directly for consultation.  He was resumed on a heparin drip 6 hours after his procedure.   GI still following given his septic shock from Klebsiella.  They are recommending considering sigmoidoscopy at some point prior to discharge to evaluate the abnormal sigmoid wall thickening on the prior CT scan that was done without oral or IV contrast.  Given his improvement in his diarrhea and they are regular readdress it and reconsider flex sigmoidoscopy  due to some abnormal finding of the wall thickening in the sigmoid colon due to his other acute issues. Currently awaiting Hand Surgery evaluation and I spoke to the physician on-call today [name] who states he will be by in the morning given that the prior physician I was called and never came to evaluate the patient.  Patient was doing okay today and was on a heparin drip however started having some bright red clots with his bowel movements so his heparin drip was held and GI was reconsulted and the plan is to proceed with a flexible sigmoidoscopy with [name] in the morning  Assessment and Plan:  Septic shock secondary to cholangitis, shock resolved and sepsis has improved Biliary obstruction secondary to cholangiocarcinoma with Hyperbilirubinemia Klebsiella Bacteremia -Post ERCP with stent replacement -Shock is improving and he has been weaned off of pressors Recent Labs  Lab 10/03/22 0352 10/04/22 0404 10/05/22 0201 09/30/2022 0315 10/07/22 0355 10/08/22 0328 10/08/22 1216  WBC 17.5* 19.0* 14.4* 12.2* 13.5* 14.5* 17.1*  -Bilirubin went from 6.6 ->  4.9 -> 4.7 -C/w Ancef at least through 1/10 which will provide coverage through 5 days after source control -Continue to Monitor and Trend and Repeat CBC in the AM    Acute Respiratory Failure Secondary to Sepsis PT/OT, mobilize -SpO2: 97 % O2 Flow Rate (L/min): 2 L/min FiO2 (%): 40 % -Continuous pulse oximetry maintain O2 saturation greater 90% -Continue  supplemental oxygen via nasal cannula wean O2 as tolerated -Will need an amatory home O2 screen prior to discharge as well as repeat chest x-ray   Chronic Heart Failure, nonischemic cardiomyopathy.   -EF less than 20%.  Reduction in cardiac function likely due to septic cardiomyopathy -Hold outpatient hypertension medications -C/w Telemetry -Hold diuretic in setting of pressor requirement, third spacing, currently weaned off of oxygen -Appears Euvolemic at this moment    Electrolyte abnormalities -Electrolyte Trend: Recent Labs  Lab 10/14/2022 0428 10/03/22 0352 10/04/22 0404 10/05/22 0201 10/17/2022 0315 09/28/2022 1800 10/07/22 0355 10/08/22 0328  NA 134* 134* 135 136 134*  --  136 132*  K 4.1 3.2* 2.9* 3.1* 3.3*  --  3.2* 3.2*  MG 1.9  --   --  1.6* 1.9  --  1.9 1.9  PHOS 4.7*  --   --   --  1.1*   < > 2.0* 2.0*   < > = values in this interval not displayed.  -Replete Potassium, Na+ and Phos with Phos-Nak -Continue to monitor and replete as necessary -Repeat electrolytes in the a.m.   LUE hand/arm swelling -US DVT/arterial duplex done and showed a radial artery occlusion -Elevate, warm compresses -Vascular consulted and took the patient for angiogram -He was found to have a high bifurcation of the radial artery with occlusion from the antecubital fossa to the wrist.  There is reconstitution of the radial artery via the interosseous artery and in the hand thrombosis of the digital arteries in the first second and third digits.  There is no flow in the digits and the palmar arch was intact.  Vascular surgery recommended hand surgery involvement and I called [name] who requested that vascular surgery call him directly for consultation.  He was resumed on a heparin drip 6 hours after his procedure. -Per my discussion with vascular surgery he will likely need finger amputation and they will reach out to [name] directly; [name] number came to evaluate the patient so I called  the hand surgeon on today and [name] will gladly see the patient in the morning -Patient on a heparin drip but this has been held given his GI bleeding -Awaiting Hand Surgery Evaluation but they are recommending allowing the fingers to demarcate and will take a look at the patient in the morning   Esophageal candidiasis -C/w Fluconazole 1/8-   Thrombocytopenia -Possibly developing in setting acute UE DVT vs drug related vs sepsis.  -Hasn't had heparin exposure here until you start on a heparin drip this morning by the oncologist -Platelet Count Trend: Recent Labs  Lab 10/03/22 0352 10/04/22 0404 10/05/22 0201 10/26/2022 0315 10/07/22 0355 10/08/22 0328 10/08/22 1216  PLT PLATELET CLUMPS NOTED ON SMEAR, UNABLE TO ESTIMATE 43* 70* 23* 71* 117* 140*  -Trend CBC    AKI on chronic kidney disease secondary to shock Metabolic Acidoiss -Contine to Monitor urine output and creatinine -BUN/Cr Trend: Recent Labs  Lab 10/04/2022 0428 10/03/22 0352 10/04/22 0404 10/05/22 0201 10/05/2022 0315 10/07/22 0355 10/08/22 0328  BUN 20 33* 26* '21 17 14 12  '$ CREATININE 1.44* 1.28* 1.01 0.93  0.90 0.91 0.81  -Avoid Nephrotoxic Medications, Contrast Dyes, Hypotension and Dehydration to Ensure Adequate Renal Perfusion and will need to Renally Adjust Meds -Patient has a Slight Metabolic Acidosis with a  CO2 of 19, AG of 7, and Chloride Level of 19 -Continue to Monitor and Trend Renal Function carefully and repeat CMP in the AM    Abnormal LFTs -LFT Trend: Recent Labs  Lab 10/05/2022 1605 10/25/2022 0428 10/03/22 0352 10/04/22 0404 10/05/22 0201 10/07/22 0355 10/08/22 0328  AST 216* 420* 204* 128* 91* 53* 56*  ALT 70* 117* 110* 86* 54* 24 22  -GI Following will continue to Monitor as LFTs are slowly declining   Likely ileus -Abdomen soft, +BS -Correct electrolyte abnormalities -Mobilize and he was given bowel regimen which is now stopped given the diarrhea which is now improving   Failure  to thrive, severe protein calorie malnutrition present on admission -Advance to full diet   Diarrhea, send improved but now had bloody bowel movements -Happening after his diagnosis and unclear of the CT scan findings were real artifact but GI still planning for a sigmoidoscopy prior to discharge to rule out any problems there -GI had signed the case given that his diarrhea has been improving but now given that he had a medium episode obstructed clot material and red blood earlier today the heparin drip was stopped and GI was reconsulted. -Repeat hemoglobin was stable -GI recommending a sigmoidoscopy and patient's daughters would like to proceed and plan is for a flexible sigmoidoscopy on 10/09/2021 with [name]  DVT prophylaxis: SCDs Start: 10/27/2022 1928    Code Status: DNR Family Communication: No family currently at bedside  Disposition Plan:  Level of care: Stepdown Status is: Inpatient Remains inpatient appropriate because: Needs further clinical improvement and evaluation by specialist and he is going to go for a flexible sigmoidoscopy given his new onset hematochezia.  Heparin drip has been held   Consultants:  PCCM transfer Vascular surgery Gastroenterology Medical oncology Hand surgery -I requested a consult from [name] but he never came to see the patient send I have consulted [name] who will see the patient in the morning  Procedures:  As delineated as above  Antimicrobials:  Anti-infectives (From admission, onward)    Start     Dose/Rate Route Frequency Ordered Stop   10/04/22 0900  fluconazole (DIFLUCAN) IVPB 200 mg        200 mg 100 mL/hr over 60 Minutes Intravenous Every 24 hours 10/04/22 0719     10/03/22 1400  ceFAZolin (ANCEF) IVPB 2g/100 mL premix        2 g 200 mL/hr over 30 Minutes Intravenous Every 8 hours 10/03/22 1025 10/08/22 2359   10/04/2022 1800  vancomycin (VANCOCIN) IVPB 1000 mg/200 mL premix  Status:  Discontinued        1,000 mg 200  mL/hr over 60 Minutes Intravenous Every 24 hours 09/28/2022 1823 10/20/2022 0801   10/03/2022 1000  fluconazole (DIFLUCAN) tablet 100 mg  Status:  Discontinued        100 mg Oral Daily 09/28/2022 1450 10/08/2022 1505   10/07/2022 0100  ceFEPIme (MAXIPIME) 2 g in sodium chloride 0.9 % 100 mL IVPB  Status:  Discontinued        2 g 200 mL/hr over 30 Minutes Intravenous Every 12 hours 10/22/2022 1823 10/03/22 1025   10/21/2022 2200  metroNIDAZOLE (FLAGYL) IVPB 500 mg  Status:  Discontinued        500 mg 100 mL/hr over 60  Minutes Intravenous Every 12 hours 10/20/2022 1823 10/03/22 1025   10/11/2022 1730  vancomycin (VANCOREADY) IVPB 1500 mg/300 mL        1,500 mg 150 mL/hr over 120 Minutes Intravenous  Once 10/19/2022 1642 10/21/2022 1951   10/19/2022 1600  piperacillin-tazobactam (ZOSYN) IVPB 3.375 g  Status:  Discontinued        3.375 g 12.5 mL/hr over 240 Minutes Intravenous Every 8 hours 10/14/2022 1451 10/05/2022 1821   10/14/2022 1500  ampicillin-sulbactam (UNASYN) 1.5 g in sodium chloride 0.9 % 100 mL IVPB  Status:  Discontinued        1.5 g 200 mL/hr over 30 Minutes Intravenous Every 8 hours 10/10/2022 1449 10/12/2022 1451   10/11/2022 1500  fluconazole (DIFLUCAN) tablet 200 mg  Status:  Discontinued        200 mg Oral  Once 10/05/2022 1450 10/08/2022 1505   10/19/2022 1300  ampicillin-sulbactam (UNASYN) 1.5 g in sodium chloride 0.9 % 100 mL IVPB        1.5 g 200 mL/hr over 30 Minutes Intravenous To Endoscopy 09/30/2022 1244 09/27/2022 1616       Subjective: Seen and examined at bedside and he was doing okay and continues to have no feeling in his hand.  Complained of some pain but no nausea or vomiting.  Denies any chest pain or shortness of breath.  Later on in the a.m. he had a bloody bowel movement so GI was reconsulted and the plan is for flexible sigmoidoscopy in the morning.  Heparin drip has been held for now  Objective: Vitals:   10/08/22 0921 10/08/22 1200 10/08/22 1246 10/08/22 1400  BP:    121/88  Pulse: 92  93 65   Resp: 17   (!) 22  Temp:  (!) 97.3 F (36.3 C)    TempSrc:  Oral    SpO2: 97%   97%  Weight:      Height:        Intake/Output Summary (Last 24 hours) at 10/08/2022 1545 Last data filed at 10/08/2022 1416 Gross per 24 hour  Intake 1146.81 ml  Output 500 ml  Net 646.81 ml   Filed Weights   10/12/2022 0317 10/07/22 0500 10/08/22 0354  Weight: 63.5 kg 66.9 kg 65.3 kg   Examination: Physical Exam:  Constitutional: Thin cachectic and chronically ill-appearing older appearing than stated age African-American male currently in no acute distress Respiratory: Diminished to auscultation bilaterally, no wheezing, rales, rhonchi or crackles. Normal respiratory effort and patient is not tachypenic. No accessory muscle use.  Unlabored breathing Cardiovascular: RRR, no murmurs / rubs / gallops. S1 and S2 auscultated. No extremity edema. Abdomen: Soft, non-tender, non-distended. Bowel sounds positive.  GU: Deferred. Musculoskeletal: No clubbing / cyanosis of digits/nails. No joint deformity upper and lower extremities. Skin: Left hand was swollen and discolored.  Neurologic: CN 2-12 grossly intact with no focal deficits. Romberg sign and cerebellar reflexes not assessed.  Psychiatric: Normal judgment and insight. Alert and oriented x 3.  Has a slightly depressed appearing mood  Data Reviewed: I have personally reviewed following labs and imaging studies  CBC: Recent Labs  Lab 10/04/22 0404 10/05/22 0201 10/19/2022 0315 10/07/22 0355 10/08/22 0328 10/08/22 1216  WBC 19.0* 14.4* 12.2* 13.5* 14.5* 17.1*  NEUTROABS 18.8* 12.5*  --  11.1* 11.9* 14.5*  HGB 9.5* 10.7* 10.4* 9.9* 9.5* 11.2*  HCT 27.0* 31.3* 30.6* 29.6* 28.1* 33.6*  MCV 94.1 95.7 97.1 98.0 96.2 99.1  PLT 69* 54* 62* 84* 117* 140*  Basic Metabolic Panel: Recent Labs  Lab 10/20/2022 0428 10/03/22 0352 10/04/22 0404 10/05/22 0201 10/14/2022 0315 09/28/2022 1800 10/07/22 0355 10/08/22 0328  NA 134*   < > 135 136 134*  --  136  132*  K 4.1   < > 2.9* 3.1* 3.3*  --  3.2* 3.2*  CL 105   < > 107 109 107  --  110 107  CO2 14*   < > 20* 18* 18*  --  19* 18*  GLUCOSE 215*   < > 166* 145* 153*  --  143* 124*  BUN 20   < > 26* 21 17  --  14 12  CREATININE 1.44*   < > 1.01 0.93 0.90  --  0.91 0.81  CALCIUM 7.4*   < > 7.6* 7.7* 7.4*  --  7.4* 7.3*  MG 1.9  --   --  1.6* 1.9  --  1.9 1.9  PHOS 4.7*  --   --   --  1.1* 2.4* 2.0* 2.0*   < > = values in this interval not displayed.   GFR: Estimated Creatinine Clearance: 81.7 mL/min (by C-G formula based on SCr of 0.81 mg/dL). Liver Function Tests: Recent Labs  Lab 10/03/22 0352 10/04/22 0404 10/05/22 0201 10/07/22 0355 10/08/22 0328  AST 204* 128* 91* 53* 56*  ALT 110* 86* 54* 24 22  ALKPHOS 648* 529* 462* 394* 346*  BILITOT 9.0* 7.7* 6.6* 4.9* 4.7*  PROT 4.8* 4.7* 4.7* 4.6* 4.5*  ALBUMIN 1.8* 1.6* 1.6* 1.8* 1.8*   Recent Labs  Lab 10/10/2022 0428  LIPASE 34   No results for input(s): "AMMONIA" in the last 168 hours. Coagulation Profile: No results for input(s): "INR", "PROTIME" in the last 168 hours. Cardiac Enzymes: No results for input(s): "CKTOTAL", "CKMB", "CKMBINDEX", "TROPONINI" in the last 168 hours. BNP (last 3 results) Recent Labs    09/22/22 1141  PROBNP 835.0*   HbA1C: No results for input(s): "HGBA1C" in the last 72 hours. CBG: Recent Labs  Lab 10/07/22 1939 10/07/22 2329 10/08/22 0324 10/08/22 0753 10/08/22 1208  GLUCAP 168* 174* 133* 131* 192*   Lipid Profile: Recent Labs    10/07/22 0355  CHOL 204*  HDL 19*  LDLCALC 158*  TRIG 134  CHOLHDL 10.7   Thyroid Function Tests: No results for input(s): "TSH", "T4TOTAL", "FREET4", "T3FREE", "THYROIDAB" in the last 72 hours. Anemia Panel: Recent Labs    10/08/22 0328  VITAMINB12 673   Sepsis Labs: Recent Labs  Lab 10/14/2022 1605 10/21/2022 0438 10/03/22 0352  LATICACIDVEN 6.6* 3.5* 1.4    Recent Results (from the past 240 hour(s))  C Difficile Quick Screen w PCR reflex      Status: None   Collection Time: 10/05/2022  6:55 AM   Specimen: STOOL  Result Value Ref Range Status   C Diff antigen NEGATIVE NEGATIVE Final   C Diff toxin NEGATIVE NEGATIVE Final   C Diff interpretation No C. difficile detected.  Final    Comment: Performed at [facility], [place] [address] [place], [place] [address]  Gastrointestinal Panel by PCR , Stool     Status: None   Collection Time: 09/30/2022  6:55 AM  Result Value Ref Range Status   Campylobacter species NOT DETECTED NOT DETECTED Final   Plesimonas shigelloides NOT DETECTED NOT DETECTED Final   Salmonella species NOT DETECTED NOT DETECTED Final   Yersinia enterocolitica NOT DETECTED NOT DETECTED Final   Vibrio species NOT DETECTED NOT DETECTED Final  Vibrio cholerae NOT DETECTED NOT DETECTED Final   Enteroaggregative E coli (EAEC) NOT DETECTED NOT DETECTED Final   Enteropathogenic E coli (EPEC) NOT DETECTED NOT DETECTED Final   Enterotoxigenic E coli (ETEC) NOT DETECTED NOT DETECTED Final   Shiga like toxin producing E coli (STEC) NOT DETECTED NOT DETECTED Final   Shigella/Enteroinvasive E coli (EIEC) NOT DETECTED NOT DETECTED Final   Cryptosporidium NOT DETECTED NOT DETECTED Final   Cyclospora cayetanensis NOT DETECTED NOT DETECTED Final   Entamoeba histolytica NOT DETECTED NOT DETECTED Final   Giardia lamblia NOT DETECTED NOT DETECTED Final   Adenovirus F40/41 NOT DETECTED NOT DETECTED Final   Astrovirus NOT DETECTED NOT DETECTED Final   Norovirus GI/GII NOT DETECTED NOT DETECTED Final   Rotavirus A NOT DETECTED NOT DETECTED Final   Sapovirus (I, II, IV, and V) NOT DETECTED NOT DETECTED Final    Comment: Performed at [facility], [place] [address] [place], [place] [address]  Culture, blood (Routine X 2) w Reflex to ID Panel     Status: Abnormal   Collection Time: 10/22/2022  8:41 AM   Specimen: BLOOD LEFT HAND  Result Value Ref Range Status   Specimen Description   Final    BLOOD LEFT  HAND Performed at [facility], [place] [address] [place], [place] [address]    Special Requests   Final    BOTTLES DRAWN AEROBIC ONLY Blood Culture adequate volume Performed at [place] [address], [place], [place] [address]    Culture  Setup Time   Final    GRAM NEGATIVE RODS AEROBIC BOTTLE ONLY Organism ID to follow CRITICAL RESULT CALLED TO, READ BACK BY AND VERIFIED WITH: M LILLISTON,PHARMD'@0012'$  10/20/2022 [place] Performed at [facility], [place] [address], [place], [place] [address]    Culture KLEBSIELLA PNEUMONIAE (A)  Final   Report Status 10/03/2022 FINAL  Final   Organism ID, Bacteria KLEBSIELLA PNEUMONIAE  Final      Susceptibility   Klebsiella pneumoniae - MIC*    AMPICILLIN RESISTANT Resistant     CEFAZOLIN <=4 SENSITIVE Sensitive     CEFEPIME <=0.12 SENSITIVE Sensitive     CEFTAZIDIME <=1 SENSITIVE Sensitive     CEFTRIAXONE <=0.25 SENSITIVE Sensitive     CIPROFLOXACIN <=0.25 SENSITIVE Sensitive     GENTAMICIN <=1 SENSITIVE Sensitive     IMIPENEM <=0.25 SENSITIVE Sensitive     TRIMETH/SULFA <=20 SENSITIVE Sensitive     AMPICILLIN/SULBACTAM <=2 SENSITIVE Sensitive     PIP/TAZO <=4 SENSITIVE Sensitive     * KLEBSIELLA PNEUMONIAE  Culture, blood (Routine X 2) w Reflex to ID Panel     Status: Abnormal   Collection Time: 10/12/2022  8:41 AM   Specimen: BLOOD RIGHT ARM  Result Value Ref Range Status   Specimen Description   Final    BLOOD RIGHT ARM Performed at [place] [address], [place], [place] [address]    Special Requests   Final    BOTTLES DRAWN AEROBIC ONLY Blood Culture adequate volume Performed at [place] [address], [place],  [address]    Culture  Setup Time   Final    GRAM NEGATIVE RODS BOTTLES DRAWN AEROBIC ONLY CRITICAL VALUE NOTED.  VALUE IS CONSISTENT WITH PREVIOUSLY REPORTED AND CALLED VALUE.    Culture (A)  Final    KLEBSIELLA  PNEUMONIAE SUSCEPTIBILITIES PERFORMED ON PREVIOUS CULTURE WITHIN THE LAST 5 DAYS. Performed at [facility], [place]  [address], [place], [place] [address]    Report Status 10/03/2022 FINAL  Final  Blood Culture ID Panel (Reflexed)     Status: Abnormal   Collection Time: 10/10/2022  8:41 AM  Result Value Ref Range Status   Enterococcus faecalis NOT DETECTED NOT DETECTED Final   Enterococcus Faecium NOT DETECTED NOT DETECTED Final   Listeria monocytogenes NOT DETECTED NOT DETECTED Final   Staphylococcus species NOT DETECTED NOT DETECTED Final   Staphylococcus aureus (BCID) NOT DETECTED NOT DETECTED Final   Staphylococcus epidermidis NOT DETECTED NOT DETECTED Final   Staphylococcus lugdunensis NOT DETECTED NOT DETECTED Final   Streptococcus species NOT DETECTED NOT DETECTED Final   Streptococcus agalactiae NOT DETECTED NOT DETECTED Final   Streptococcus pneumoniae NOT DETECTED NOT DETECTED Final   Streptococcus pyogenes NOT DETECTED NOT DETECTED Final   A.calcoaceticus-baumannii NOT DETECTED NOT DETECTED Final   Bacteroides fragilis NOT DETECTED NOT DETECTED Final   Enterobacterales DETECTED (A) NOT DETECTED Final    Comment: Enterobacterales represent a large order of gram negative bacteria, not a single organism. CRITICAL RESULT CALLED TO, READ BACK BY AND VERIFIED WITH: M LILLISTON,PHARMD'@0012'$  10/05/2022 [place]    Enterobacter cloacae complex NOT DETECTED NOT DETECTED Final   Escherichia coli NOT DETECTED NOT DETECTED Final   Klebsiella aerogenes NOT DETECTED NOT DETECTED Final   Klebsiella oxytoca NOT DETECTED NOT DETECTED Final   Klebsiella pneumoniae DETECTED (A) NOT DETECTED Final    Comment: CRITICAL RESULT CALLED TO, READ BACK BY AND VERIFIED WITH: M LILLISTON,PHARMD'@0012'$  10/14/2022 [place]    Proteus species NOT DETECTED NOT DETECTED Final   Salmonella species NOT DETECTED NOT DETECTED Final   Serratia marcescens NOT DETECTED NOT DETECTED Final   Haemophilus influenzae NOT DETECTED  NOT DETECTED Final   Neisseria meningitidis NOT DETECTED NOT DETECTED Final   Pseudomonas aeruginosa NOT DETECTED NOT DETECTED Final   Stenotrophomonas maltophilia NOT DETECTED NOT DETECTED Final   Candida albicans NOT DETECTED NOT DETECTED Final   Candida auris NOT DETECTED NOT DETECTED Final   Candida glabrata NOT DETECTED NOT DETECTED Final   Candida krusei NOT DETECTED NOT DETECTED Final   Candida parapsilosis NOT DETECTED NOT DETECTED Final   Candida tropicalis NOT DETECTED NOT DETECTED Final   Cryptococcus neoformans/gattii NOT DETECTED NOT DETECTED Final   CTX-M ESBL NOT DETECTED NOT DETECTED Final   Carbapenem resistance IMP NOT DETECTED NOT DETECTED Final   Carbapenem resistance KPC NOT DETECTED NOT DETECTED Final   Carbapenem resistance NDM NOT DETECTED NOT DETECTED Final   Carbapenem resist OXA 48 LIKE NOT DETECTED NOT DETECTED Final   Carbapenem resistance VIM NOT DETECTED NOT DETECTED Final    Comment: Performed at [facility], [address], [place], [place] [address]  MRSA Next Gen by PCR, Nasal     Status: None   Collection Time: 10/11/2022  3:59 PM   Specimen: Nasal Mucosa; Nasal Swab  Result Value Ref Range Status   MRSA by PCR Next Gen NOT DETECTED NOT DETECTED Final    Comment: (NOTE) The GeneXpert MRSA Assay (FDA approved for NASAL specimens only), is one component of a comprehensive MRSA colonization surveillance program. It is not intended to diagnose MRSA infection nor to guide or monitor treatment for MRSA infections. Test performance is not FDA approved in patients less than 77 years old. Performed at [facility], [place] [address] [place],  [address]   Urine Culture     Status: None   Collection Time: 10/27/2022  4:50 PM  Specimen: Urine, Catheterized  Result Value Ref Range Status   Specimen Description   Final    URINE, CATHETERIZED Performed at [place] [place] [address] [place], [place]  [address]    Special Requests   Final    NONE Performed at [facility], [place] [address], [place], [place] [address]    Culture   Final    NO GROWTH Performed at [facility], [place] [address], [place], [place] [address]    Report Status 10/03/2022 FINAL  Final    Radiology Studies: No results found.  Scheduled Meds:  Chlorhexidine Gluconate Cloth  6 each Topical Daily   digoxin  0.125 mg Oral Daily   feeding supplement  1 Container Oral TID BM   hydrocortisone sod succinate (SOLU-CORTEF) inj  100 mg Intravenous Q8H   insulin aspart  0-9 Units Subcutaneous Q4H   multivitamin with minerals  1 tablet Oral Daily   potassium & sodium phosphates  1 packet Oral TID WC & HS   potassium chloride  40 mEq Oral BID   sodium chloride flush  3 mL Intravenous Q12H   Continuous Infusions:  sodium chloride Stopped (10/08/22 1055)    ceFAZolin (ANCEF) IV 2 g (10/08/22 1523)   fluconazole (DIFLUCAN) IV Stopped (10/08/22 1125)    LOS: 9 days   Raiford Noble, DO [facility] Available via Epic secure chat 7am-7pm After these hours, please refer to coverage provider listed on [email or web address] 10/08/2022, 3:45 PM

## 2022-10-08 NOTE — Progress Notes (Addendum)
[facility] Progress Note  CC:  Diarrhea, rectal bleeding/hematochezia  Subjective:  Patient denies any abdominal pain.  No new complaints.  Per nurse he had a medium amount of red blood with shredded clot material with BM this AM.  Objective:  Vital signs in last 24 hours: Temp:  [97.4 F (36.3 C)-97.9 F (36.6 C)] 97.4 F (36.3 C) (01/12 0700) Pulse Rate:  [41-93] 93 (01/12 1246) Resp:  [11-20] 17 (01/12 0921) BP: (114-168)/(59-105) 139/105 (01/12 0800) SpO2:  [93 %-100 %] 97 % (01/12 0921) Weight:  [65.3 kg] 65.3 kg (01/12 0354) Last BM Date : 10/07/22 General:  Alert, thin and chronically ill-appearing, in NAD Heart:  Regular rate and rhythm; no murmurs Pulm:  CTAB.  No W/R/R. Abdomen:  Soft, non-distended.  BS present.  Non-tender.  Intake/Output from previous day: 01/11 0701 - 01/12 0700 In: 1620.8 [I.V.:708.2; IV Piggyback:912.6] Out: 500 [Urine:500]  Lab Results: Recent Labs    10/07/22 0355 10/08/22 0328 10/08/22 1216  WBC 13.5* 14.5* 17.1*  HGB 9.9* 9.5* 11.2*  HCT 29.6* 28.1* 33.6*  PLT 84* 117* 140*   BMET Recent Labs    10/24/2022 0315 10/07/22 0355 10/08/22 0328  NA 134* 136 132*  K 3.3* 3.2* 3.2*  CL 107 110 107  CO2 18* 19* 18*  GLUCOSE 153* 143* 124*  BUN '17 14 12  '$ CREATININE 0.90 0.91 0.81  CALCIUM 7.4* 7.4* 7.3*   LFT Recent Labs    10/08/22 0328  PROT 4.5*  ALBUMIN 1.8*  AST 56*  ALT 22  ALKPHOS 346*  BILITOT 4.7*   PERIPHERAL VASCULAR CATHETERIZATION  Result Date: 10/04/2022 Images from the original result were not included.   Patient name: [name] [name] MRN: [id number]        DOB: 04-Aug-1956        Sex: male  10/25/2022 Pre-operative Diagnosis: Left upper extremity hand ischemia status post left radial arterial line Post-operative diagnosis:  Same Surgeon:  Broadus John, MD Procedure Performed: 1.  Ultrasound-guided micropuncture access of the right common femoral artery in retrograde fashion 2.  Arch  angiogram 3.  Third order cannulation, left upper extremity angiogram 4.  Moderate sedation 43 minutes 5.  Contrast volume 150 mL 6.  Device assisted closure-Mynx   Indications: This is a 67 y.o. male with what appears to be an ischemic left hand status post radial artery cannulation for hemodynamic monitoring now with occluded radial artery by recent duplex and very weak flow into the hand by ulnar artery.  This is being ongoing for a few days at this point.  Unfortunately patient with decreasing platelets per primary team not a candidate for anticoagulation at this time.  Will plan for angiography from femoral approach tomorrow in the Cath Lab at [facility].  He will be scheduled for the last case the day and will need to be transferred over for this procedure and can be sent back afterwards as long as he is stable for transfer.  I discussed with the patient and his daughter at the bedside that he is at high risk of digit loss with or without revascularization possibly could lose more than just his digits up to and including his hand.  Will likely require hand surgery involvement in his case.    Findings: Arch angiogram demonstrates normal first-order vessels without stenosis. Left upper extremity: High bifurcation of the radial artery from the proximal brachial artery.  Sluggish flow in the radial artery to the level of the  antecubital fossa where ends in collaterals.  The brachial artery continues giving off the ulnar and interosseous arteries. These demonstrate normal flow.  The interosseous artery reconstitutes the radial artery at the level of the wrist.  The palmar arch is intact with digital arteries of the fourth and fifth digits.  There are no branches from the palmar arch to the first second and third digits.              Procedure:  The patient was identified in the holding area and taken to room 8.  The patient was then placed supine on the table and prepped and draped in the usual sterile fashion.  A  time out was called.  Ultrasound was used to evaluate the right common femoral artery.  It was patent .  A digital ultrasound image was acquired.  A micropuncture needle was used to access the right common femoral artery under ultrasound guidance.  An 018 wire was advanced without resistance and a micropuncture sheath was placed.  The 018 wire was removed and a benson wire was placed.  The micropuncture sheath was exchanged for a 5 french sheath.  The patient was heparinized, and a pigtail flush catheter was placed in the ascending aorta for arch angiogram.  See results above.  Next, a Glidewire advantage wire was used to select the left subclavian artery.  The wire was exchanged for a cathter that was parked in the left axillary artery.  See results above.  In an effort to assess whether recannulization of the radial artery would improve distal flow, a wire was advanced through the radial artery into the distal patent portion located at the wrist.  Follow-up angiography demonstrated no improvement hand fifth digit perfusion.  Being that perfusion was unchanged with angiography from the distal radial artery elected to end the case.  Arteriotomy was managed with a minx device.  Impression: High bifurcation of the radial artery with occlusion from the antecubital fossa to the wrist.  There is reconstitution of the radial artery via the interosseous artery.  In the hand, thrombosis of digital arteries in the first, second and third digits.  No flow in these digits. Palmar arch intact.  Recommend hand surgery involvement as patient will require future amputation.   Cassandria Santee, MD [facility] Office: [phone]    Assessment / Plan: #86 67 year old male with recent diagnosis of ampullary adenocarcinoma with bili obstruction admitted with recurrent biliary obstruction.  Initial stent had been placed December 2023.  He underwent ERCP and stent exchange with uncovered stent on 10/04/2022  then immediately decompensated with severe sepsis.   Bacteremic with cultures positive for Enterobacter and Klebsiella Antibiotics narrowed to Ancef.   He has been weaned off pressors.   Parameters all improving, continued improvement in LFTs.   Eventual treatment plan per oncology outpatient.   #2 severe cardiomyopathy with EF of <20% #3 acute kidney injury on chronic kidney disease-parameters improved/acute issues resolved.   #4 chronic diarrhea etiology not clear-had started a few weeks prior to admission question if this was related to previous antibiotics, versus bile acid induced. Nevertheless he has had significant improvement this admission and not having any current issues with diarrhea. Plan was consideration of sigmoidoscopy at some point prior to discharge-abnormal sigmoid wall thickening on prior CT was done without oral or IV contrast.   #5 esophageal candidiasis noted at ERCP on IV Diflucan-would plan 14 day course  #6 hematochezia:  Had a medium episode  of shredded clot material/red blood earlier today so heparin was stopped.  Hgb stable/improved at 11.2 grams.  -Offered sigmoidoscopy with [name] on 1/13.  Patient seems agreeable, but due to the complexity of his medical issues, Dr. Loletha Carrow had conversation with his daughter who is also going to confer with her sister and both speak with their dad (the patient). -Otherwise trend labs. -Await ortho input for possible digit amputations.  **Addendum: I spoke with both the patient's daughters on the phone.  They agree and would like to proceed with flexible sigmoidoscopy for their father tomorrow, 1/13, with [name].  Orders were placed.  All their questions were answered to the best of my ability.     LOS: 9 days   Laban Emperor. Zehr  10/08/2022, 12:55 PM I have taken an interval history, thoroughly reviewed the chart and examined the patient. I agree with the Advanced Practitioner's note, impression and recommendations, and  have recorded additional findings, impressions and recommendations below. I performed a substantive portion of this encounter (>50% time spent), including a complete performance of the medical decision making.  My additional thoughts are as follows:  Unfortunately, yet another acute issue on this medically complex patient with recent critical illness.  Fortunately, his biliary sepsis appears to have cleared, and he is awaiting hand surgery evaluation for probable amputation of necrotic digits after vascular surgery evaluation.  He was on IV heparin for vascular compromise to the left hand and then developed a single episode of hematochezia this morning.  This is in the context of chronic diarrhea and a recent CT scan (admittedly a limited without contrast) suggesting a possible abnormality in the sigmoid.  No further episodes of bleeding since this morning and no hemodynamic compromise with it.  I saw this patient about midday today, and then we have had multiple conversations with his family while they conferred amongst themselves and with the patient on how they want to proceed.  Given his recently diagnosed cholangiocarcinoma for which treatment is going to be offered but likely limited due to his debility and severe cardiomyopathy as well as recent sepsis, and considering the severity of the vascular compromise to his left hand with possible need for further consultation, I need to understand how aggressive they plan to be with care.  This patient appears able to make his own decisions, and the family is respecting his wishes to receive treatment for what issues might be attended to.  Therefore, we offered a sigmoidoscopy tomorrow ((Dr. Carol Ada covering for the weekend), and they were all agreeable.  Risks outlined as below  The benefits and risks of the planned procedure were described in detail with the patient or (when appropriate) their health care proxy.  Risks were outlined as including, but  not limited to, bleeding, infection, perforation, adverse medication reaction leading to cardiac or pulmonary decompensation, pancreatitis (if ERCP).  The limitation of incomplete mucosal visualization was also discussed.  No guarantees or warranties were given.  Patient at increased risk for cardiopulmonary complications of procedure due to medical comorbidities.   45 minutes were spent on this encounter (including chart review, history/exam, counseling/coordination of care, and documentation) > 50% of that time was spent on counseling and coordination of care.  Nelida Meuse III Office:[phone]

## 2022-10-08 NOTE — Progress Notes (Signed)
Pharmacy: [facility]  Heparin drip stopped this morning ~10:45a d/t rectal bleeding/hematochezia.    If heparin is needed in the future, please re-enter pharmacy consult for heparin drip.  Pharmacy will signed off. Re-consult [place] if need further assistance.  Dia Sitter, PharmD, BCPS 10/08/2022 1:47 PM

## 2022-10-08 NOTE — Progress Notes (Incomplete)
PROGRESS NOTE    [name] [name]  NFA:[id number] DOB: 1956/03/13 DOA: 10/03/2022 PCP: Terrilyn Saver, NP   Brief Narrative:  [name] [name] is a 67 y.o. 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    Assessment and Plan: No notes have been filed under this hospital service. Service: Hospitalist      ***  DVT prophylaxis: SCDs Start: 10/14/2022 1928    Code Status: DNR Family Communication:   Disposition Plan:  Level of care: Progressive Status is: Inpatient {Inpatient:23812}    Consultants:  ***  Procedures:  ***  Antimicrobials:  Anti-infectives (From admission, onward)    Start     Dose/Rate Route Frequency Ordered Stop   10/04/22 0900  fluconazole (DIFLUCAN) IVPB 200 mg        200 mg 100 mL/hr over 60 Minutes Intravenous Every 24 hours 10/04/22 0719     10/03/22 1400  ceFAZolin (ANCEF) IVPB 2g/100 mL premix        2 g 200 mL/hr over 30 Minutes Intravenous Every 8 hours 10/03/22 1025 10/08/22 2359   10/17/2022 1800  vancomycin (VANCOCIN) IVPB 1000 mg/200 mL premix  Status:  Discontinued        1,000 mg 200 mL/hr over 60 Minutes Intravenous Every 24 hours 09/30/2022 1823 10/04/2022 0801   10/13/2022 1000  fluconazole (DIFLUCAN) tablet 100 mg  Status:  Discontinued        100 mg Oral Daily 10/11/2022 1450 09/30/2022 1505   09/30/2022 0100  ceFEPIme (MAXIPIME) 2 g in sodium chloride 0.9 % 100 mL IVPB  Status:  Discontinued        2 g 200 mL/hr over 30 Minutes Intravenous Every 12 hours 10/23/2022 1823 10/03/22 1025   10/24/2022 2200  metroNIDAZOLE (FLAGYL) IVPB 500 mg  Status:  Discontinued        500 mg 100 mL/hr over 60 Minutes Intravenous Every 12 hours 10/13/2022 1823 10/03/22 1025   10/17/2022 1730  vancomycin (VANCOREADY) IVPB 1500  mg/300 mL        1,500 mg 150 mL/hr over 120 Minutes Intravenous  Once 10/05/2022 1642 10/19/2022 1951   09/28/2022 1600  piperacillin-tazobactam (ZOSYN) IVPB 3.375 g  Status:  Discontinued        3.375 g 12.5 mL/hr over 240 Minutes Intravenous Every 8 hours 10/18/2022 1451 10/07/2022 1821   10/03/2022 1500  ampicillin-sulbactam (UNASYN) 1.5 g in sodium chloride 0.9 % 100 mL IVPB  Status:  Discontinued        1.5 g 200 mL/hr over 30 Minutes Intravenous Every 8 hours 10/01/2022 1449 09/28/2022 1451   09/28/2022 1500  fluconazole (DIFLUCAN) tablet 200 mg  Status:  Discontinued        200 mg Oral  Once 10/27/2022 1450 10/05/2022 1505   10/04/2022 1300  ampicillin-sulbactam (UNASYN) 1.5 g in sodium chloride 0.9 % 100 mL IVPB        1.5 g 200 mL/hr over 30 Minutes Intravenous To Endoscopy 09/28/2022 1244 10/21/2022 1616        Subjective: ***  Objective: Vitals:   10/08/22 0300 10/08/22 0354 10/08/22 0400 10/08/22 0600  BP: (!) 140/63  (!) 142/83 (!) 135/91  Pulse: 72  61 67  Resp: '17  16 11  '$ Temp:  97.9 F (36.6 C)    TempSrc:  Oral  SpO2: 98%  93% 97%  Weight:  65.3 kg    Height:        Intake/Output Summary (Last 24 hours) at 10/08/2022 0820 Last data filed at 10/08/2022 2130 Gross per 24 hour  Intake 1620.78 ml  Output 500 ml  Net 1120.78 ml   Filed Weights   10/05/2022 0317 10/07/22 0500 10/08/22 0354  Weight: 63.5 kg 66.9 kg 65.3 kg    Examination: Physical Exam:  Constitutional: WN/WD, NAD and appears calm and comfortable Eyes: PERRL, lids and conjunctivae normal, sclerae anicteric  ENMT: External Ears, Nose appear normal. Grossly normal hearing. Mucous membranes are moist. Posterior pharynx clear of any exudate or lesions. Normal dentition.  Neck: Appears normal, supple, no cervical masses, normal ROM, no appreciable thyromegaly Respiratory: Clear to auscultation bilaterally, no wheezing, rales, rhonchi or crackles. Normal respiratory effort and patient is not tachypenic. No accessory  muscle use.  Cardiovascular: RRR, no murmurs / rubs / gallops. S1 and S2 auscultated. No extremity edema. 2+ pedal pulses. No carotid bruits.  Abdomen: Soft, non-tender, non-distended. No masses palpated. No appreciable hepatosplenomegaly. Bowel sounds positive.  GU: Deferred. Musculoskeletal: No clubbing / cyanosis of digits/nails. No joint deformity upper and lower extremities. Good ROM, no contractures. Normal strength and muscle tone.  Skin: No rashes, lesions, ulcers. No induration; Warm and dry.  Neurologic: CN 2-12 grossly intact with no focal deficits. Sensation intact in all 4 Extremities, DTR normal. Strength 5/5 in all 4. Romberg sign cerebellar reflexes not assessed.  Psychiatric: Normal judgment and insight. Alert and oriented x 3. Normal mood and appropriate affect.   Data Reviewed: I have personally reviewed following labs and imaging studies  CBC: Recent Labs  Lab 10/03/22 0352 10/04/22 0404 10/05/22 0201 10/08/2022 0315 10/07/22 0355 10/08/22 0328  WBC 17.5* 19.0* 14.4* 12.2* 13.5* 14.5*  NEUTROABS 15.8* 18.8* 12.5*  --  11.1* 11.9*  HGB 9.6* 9.5* 10.7* 10.4* 9.9* 9.5*  HCT 27.2* 27.0* 31.3* 30.6* 29.6* 28.1*  MCV 93.5 94.1 95.7 97.1 98.0 96.2  PLT PLATELET CLUMPS NOTED ON SMEAR, UNABLE TO ESTIMATE 69* 54* 62* 84* 865*   Basic Metabolic Panel: Recent Labs  Lab 10/26/2022 0428 10/03/22 0352 10/04/22 0404 10/05/22 0201 10/21/2022 0315 10/16/2022 1800 10/07/22 0355 10/08/22 0328  NA 134*   < > 135 136 134*  --  136 132*  K 4.1   < > 2.9* 3.1* 3.3*  --  3.2* 3.2*  CL 105   < > 107 109 107  --  110 107  CO2 14*   < > 20* 18* 18*  --  19* 18*  GLUCOSE 215*   < > 166* 145* 153*  --  143* 124*  BUN 20   < > 26* 21 17  --  14 12  CREATININE 1.44*   < > 1.01 0.93 0.90  --  0.91 0.81  CALCIUM 7.4*   < > 7.6* 7.7* 7.4*  --  7.4* 7.3*  MG 1.9  --   --  1.6* 1.9  --  1.9 1.9  PHOS 4.7*  --   --   --  1.1* 2.4* 2.0* 2.0*   < > = values in this interval not displayed.    GFR: Estimated Creatinine Clearance: 81.7 mL/min (by C-G formula based on SCr of 0.81 mg/dL). Liver Function Tests: Recent Labs  Lab 10/03/22 0352 10/04/22 0404 10/05/22 0201 10/07/22 0355 10/08/22 0328  AST 204* 128* 91* 53* 56*  ALT 110* 86* 54* 24 22  ALKPHOS  648* 529* 462* 394* 346*  BILITOT 9.0* 7.7* 6.6* 4.9* 4.7*  PROT 4.8* 4.7* 4.7* 4.6* 4.5*  ALBUMIN 1.8* 1.6* 1.6* 1.8* 1.8*   Recent Labs  Lab 10/26/2022 0428  LIPASE 34   No results for input(s): "AMMONIA" in the last 168 hours. Coagulation Profile: No results for input(s): "INR", "PROTIME" in the last 168 hours. Cardiac Enzymes: No results for input(s): "CKTOTAL", "CKMB", "CKMBINDEX", "TROPONINI" in the last 168 hours. BNP (last 3 results) Recent Labs    09/22/22 1141  PROBNP 835.0*   HbA1C: No results for input(s): "HGBA1C" in the last 72 hours. CBG: Recent Labs  Lab 10/07/22 1633 10/07/22 1939 10/07/22 2329 10/08/22 0324 10/08/22 0753  GLUCAP 245* 168* 174* 133* 131*   Lipid Profile: Recent Labs    10/07/22 0355  CHOL 204*  HDL 19*  LDLCALC 158*  TRIG 134  CHOLHDL 10.7   Thyroid Function Tests: No results for input(s): "TSH", "T4TOTAL", "FREET4", "T3FREE", "THYROIDAB" in the last 72 hours. Anemia Panel: Recent Labs    10/08/22 0328  VITAMINB12 673   Sepsis Labs: Recent Labs  Lab 10/05/2022 1605 10/04/2022 0438 10/03/22 0352  LATICACIDVEN 6.6* 3.5* 1.4    Recent Results (from the past 240 hour(s))  C Difficile Quick Screen w PCR reflex     Status: None   Collection Time: 10/11/2022  6:55 AM   Specimen: STOOL  Result Value Ref Range Status   C Diff antigen NEGATIVE NEGATIVE Final   C Diff toxin NEGATIVE NEGATIVE Final   C Diff interpretation No C. difficile detected.  Final    Comment: Performed at [facility], [place] [address], [place], [place] [address]  Gastrointestinal Panel by PCR , Stool     Status: None   Collection Time: 10/17/2022  6:55 AM  Result Value  Ref Range Status   Campylobacter species NOT DETECTED NOT DETECTED Final   Plesimonas shigelloides NOT DETECTED NOT DETECTED Final   Salmonella species NOT DETECTED NOT DETECTED Final   Yersinia enterocolitica NOT DETECTED NOT DETECTED Final   Vibrio species NOT DETECTED NOT DETECTED Final   Vibrio cholerae NOT DETECTED NOT DETECTED Final   Enteroaggregative E coli (EAEC) NOT DETECTED NOT DETECTED Final   Enteropathogenic E coli (EPEC) NOT DETECTED NOT DETECTED Final   Enterotoxigenic E coli (ETEC) NOT DETECTED NOT DETECTED Final   Shiga like toxin producing E coli (STEC) NOT DETECTED NOT DETECTED Final   Shigella/Enteroinvasive E coli (EIEC) NOT DETECTED NOT DETECTED Final   Cryptosporidium NOT DETECTED NOT DETECTED Final   Cyclospora cayetanensis NOT DETECTED NOT DETECTED Final   Entamoeba histolytica NOT DETECTED NOT DETECTED Final   Giardia lamblia NOT DETECTED NOT DETECTED Final   Adenovirus F40/41 NOT DETECTED NOT DETECTED Final   Astrovirus NOT DETECTED NOT DETECTED Final   Norovirus GI/GII NOT DETECTED NOT DETECTED Final   Rotavirus A NOT DETECTED NOT DETECTED Final   Sapovirus (I, II, IV, and V) NOT DETECTED NOT DETECTED Final    Comment: Performed at [facility], [place] [place], [place] [address]  Culture, blood (Routine X 2) w Reflex to ID Panel     Status: Abnormal   Collection Time: 10/16/2022  8:41 AM   Specimen: BLOOD LEFT HAND  Result Value Ref Range Status   Specimen Description   Final    BLOOD LEFT HAND Performed at [facility], [place] [address] [place], [place] [address]    Special Requests   Final  BOTTLES DRAWN AEROBIC ONLY Blood Culture adequate volume Performed at [place] [address], [place], [place] [address]    Culture  Setup Time   Final    GRAM NEGATIVE RODS AEROBIC BOTTLE ONLY Organism ID to follow CRITICAL RESULT CALLED TO, READ BACK BY AND VERIFIED WITH: M LILLISTON,PHARMD'@0012'$   10/05/2022 [place] Performed at [facility], [place] [address] [place], [place] [address]    Culture KLEBSIELLA PNEUMONIAE (A)  Final   Report Status 10/03/2022 FINAL  Final   Organism ID, Bacteria KLEBSIELLA PNEUMONIAE  Final      Susceptibility   Klebsiella pneumoniae - MIC*    AMPICILLIN RESISTANT Resistant     CEFAZOLIN <=4 SENSITIVE Sensitive     CEFEPIME <=0.12 SENSITIVE Sensitive     CEFTAZIDIME <=1 SENSITIVE Sensitive     CEFTRIAXONE <=0.25 SENSITIVE Sensitive     CIPROFLOXACIN <=0.25 SENSITIVE Sensitive     GENTAMICIN <=1 SENSITIVE Sensitive     IMIPENEM <=0.25 SENSITIVE Sensitive     TRIMETH/SULFA <=20 SENSITIVE Sensitive     AMPICILLIN/SULBACTAM <=2 SENSITIVE Sensitive     PIP/TAZO <=4 SENSITIVE Sensitive     * KLEBSIELLA PNEUMONIAE  Culture, blood (Routine X 2) w Reflex to ID Panel     Status: Abnormal   Collection Time: 10/08/2022  8:41 AM   Specimen: BLOOD RIGHT ARM  Result Value Ref Range Status   Specimen Description   Final    BLOOD RIGHT ARM Performed at [place] [address], [place], [place] [address]    Special Requests   Final    BOTTLES DRAWN AEROBIC ONLY Blood Culture adequate volume Performed at [facility] [address], [place], [place] [address]    Culture  Setup Time   Final    GRAM NEGATIVE RODS BOTTLES DRAWN AEROBIC ONLY CRITICAL VALUE NOTED.  VALUE IS CONSISTENT WITH PREVIOUSLY REPORTED AND CALLED VALUE.    Culture (A)  Final    KLEBSIELLA PNEUMONIAE SUSCEPTIBILITIES PERFORMED ON PREVIOUS CULTURE WITHIN THE LAST 5 DAYS. Performed at [facility], [place] [address] [place],  [address]    Report Status 10/03/2022 FINAL  Final  Blood Culture ID Panel (Reflexed)     Status: Abnormal   Collection Time: 09/28/2022  8:41 AM  Result Value Ref Range Status   Enterococcus faecalis NOT DETECTED NOT DETECTED Final   Enterococcus Faecium NOT DETECTED NOT DETECTED Final   Listeria monocytogenes NOT  DETECTED NOT DETECTED Final   Staphylococcus species NOT DETECTED NOT DETECTED Final   Staphylococcus aureus (BCID) NOT DETECTED NOT DETECTED Final   Staphylococcus epidermidis NOT DETECTED NOT DETECTED Final   Staphylococcus lugdunensis NOT DETECTED NOT DETECTED Final   Streptococcus species NOT DETECTED NOT DETECTED Final   Streptococcus agalactiae NOT DETECTED NOT DETECTED Final   Streptococcus pneumoniae NOT DETECTED NOT DETECTED Final   Streptococcus pyogenes NOT DETECTED NOT DETECTED Final   A.calcoaceticus-baumannii NOT DETECTED NOT DETECTED Final   Bacteroides fragilis NOT DETECTED NOT DETECTED Final   Enterobacterales DETECTED (A) NOT DETECTED Final    Comment: Enterobacterales represent a large order of gram negative bacteria, not a single organism. CRITICAL RESULT CALLED TO, READ BACK BY AND VERIFIED WITH: M LILLISTON,PHARMD'@0012'$  10/05/2022 [place]    Enterobacter cloacae complex NOT DETECTED NOT DETECTED Final   Escherichia coli NOT DETECTED NOT DETECTED Final   Klebsiella aerogenes NOT DETECTED NOT DETECTED Final   Klebsiella oxytoca NOT DETECTED NOT DETECTED Final   Klebsiella pneumoniae  DETECTED (A) NOT DETECTED Final    Comment: CRITICAL RESULT CALLED TO, READ BACK BY AND VERIFIED WITH: M LILLISTON,PHARMD'@0012'$  10/25/2022 [place]    Proteus species NOT DETECTED NOT DETECTED Final   Salmonella species NOT DETECTED NOT DETECTED Final   Serratia marcescens NOT DETECTED NOT DETECTED Final   Haemophilus influenzae NOT DETECTED NOT DETECTED Final   Neisseria meningitidis NOT DETECTED NOT DETECTED Final   Pseudomonas aeruginosa NOT DETECTED NOT DETECTED Final   Stenotrophomonas maltophilia NOT DETECTED NOT DETECTED Final   Candida albicans NOT DETECTED NOT DETECTED Final   Candida auris NOT DETECTED NOT DETECTED Final   Candida glabrata NOT DETECTED NOT DETECTED Final   Candida krusei NOT DETECTED NOT DETECTED Final   Candida parapsilosis NOT DETECTED NOT DETECTED Final   Candida  tropicalis NOT DETECTED NOT DETECTED Final   Cryptococcus neoformans/gattii NOT DETECTED NOT DETECTED Final   CTX-M ESBL NOT DETECTED NOT DETECTED Final   Carbapenem resistance IMP NOT DETECTED NOT DETECTED Final   Carbapenem resistance KPC NOT DETECTED NOT DETECTED Final   Carbapenem resistance NDM NOT DETECTED NOT DETECTED Final   Carbapenem resist OXA 48 LIKE NOT DETECTED NOT DETECTED Final   Carbapenem resistance VIM NOT DETECTED NOT DETECTED Final    Comment: Performed at [facility], [address], [place], [place] [address]  MRSA Next Gen by PCR, Nasal     Status: None   Collection Time: 10/12/2022  3:59 PM   Specimen: Nasal Mucosa; Nasal Swab  Result Value Ref Range Status   MRSA by PCR Next Gen NOT DETECTED NOT DETECTED Final    Comment: (NOTE) The GeneXpert MRSA Assay (FDA approved for NASAL specimens only), is one component of a comprehensive MRSA colonization surveillance program. It is not intended to diagnose MRSA infection nor to guide or monitor treatment for MRSA infections. Test performance is not FDA approved in patients less than 29 years old. Performed at [facility], [place] [address] [place], [place] [address]   Urine Culture     Status: None   Collection Time: 10/16/2022  4:50 PM   Specimen: Urine, Catheterized  Result Value Ref Range Status   Specimen Description   Final    URINE, CATHETERIZED Performed at [facility] [address] [place], [place] [address]    Special Requests   Final    NONE Performed at [facility], [place] [address] [place], [place] [address]    Culture   Final    NO GROWTH Performed at [facility], [place] [address] [place], [place] [address]    Report Status 10/03/2022 FINAL  Final     Radiology Studies: PERIPHERAL VASCULAR CATHETERIZATION  Result Date: 10/05/2022 Images from the original result were not included.   Patient name: [name] [name] MRN:  [id number]        DOB: 30-Aug-1956        Sex: male  10/23/2022 Pre-operative Diagnosis: Left upper extremity hand ischemia status post left radial arterial line Post-operative diagnosis:  Same Surgeon:  Broadus John, MD Procedure Performed: 1.  Ultrasound-guided micropuncture access of the right common femoral artery in retrograde fashion 2.  Arch angiogram 3.  Third order cannulation, left upper extremity angiogram 4.  Moderate sedation 43 minutes 5.  Contrast volume 150 mL 6.  Device assisted closure-Mynx   Indications: This is a 67 y.o. male with what appears to be an ischemic left hand status post radial artery cannulation for hemodynamic  monitoring now with occluded radial artery by recent duplex and very weak flow into the hand by ulnar artery.  This is being ongoing for a few days at this point.  Unfortunately patient with decreasing platelets per primary team not a candidate for anticoagulation at this time.  Will plan for angiography from femoral approach tomorrow in the Cath Lab at [facility].  He will be scheduled for the last case the day and will need to be transferred over for this procedure and can be sent back afterwards as long as he is stable for transfer.  I discussed with the patient and his daughter at the bedside that he is at high risk of digit loss with or without revascularization possibly could lose more than just his digits up to and including his hand.  Will likely require hand surgery involvement in his case.    Findings: Arch angiogram demonstrates normal first-order vessels without stenosis. Left upper extremity: High bifurcation of the radial artery from the proximal brachial artery.  Sluggish flow in the radial artery to the level of the antecubital fossa where ends in collaterals.  The brachial artery continues giving off the ulnar and interosseous arteries. These demonstrate normal flow.  The interosseous artery reconstitutes the radial artery at the level of the wrist.  The  palmar arch is intact with digital arteries of the fourth and fifth digits.  There are no branches from the palmar arch to the first second and third digits.              Procedure:  The patient was identified in the holding area and taken to room 8.  The patient was then placed supine on the table and prepped and draped in the usual sterile fashion.  A time out was called.  Ultrasound was used to evaluate the right common femoral artery.  It was patent .  A digital ultrasound image was acquired.  A micropuncture needle was used to access the right common femoral artery under ultrasound guidance.  An 018 wire was advanced without resistance and a micropuncture sheath was placed.  The 018 wire was removed and a benson wire was placed.  The micropuncture sheath was exchanged for a 5 french sheath.  The patient was heparinized, and a pigtail flush catheter was placed in the ascending aorta for arch angiogram.  See results above.  Next, a Glidewire advantage wire was used to select the left subclavian artery.  The wire was exchanged for a cathter that was parked in the left axillary artery.  See results above.  In an effort to assess whether recannulization of the radial artery would improve distal flow, a wire was advanced through the radial artery into the distal patent portion located at the wrist.  Follow-up angiography demonstrated no improvement hand fifth digit perfusion.  Being that perfusion was unchanged with angiography from the distal radial artery elected to end the case.  Arteriotomy was managed with a minx device.  Impression: High bifurcation of the radial artery with occlusion from the antecubital fossa to the wrist.  There is reconstitution of the radial artery via the interosseous artery.  In the hand, thrombosis of digital arteries in the first, second and third digits.  No flow in these digits. Palmar arch intact.  Recommend hand surgery involvement as patient will require future amputation.   Cassandria Santee, MD [facility] Office: [phone]     Scheduled Meds:  Chlorhexidine Gluconate Cloth  6  each Topical Daily   digoxin  0.125 mg Oral Daily   feeding supplement  1 Container Oral TID BM   hydrocortisone sod succinate (SOLU-CORTEF) inj  100 mg Intravenous Q8H   insulin aspart  0-9 Units Subcutaneous Q4H   multivitamin with minerals  1 tablet Oral Daily   sodium chloride flush  3 mL Intravenous Q12H   Continuous Infusions:  sodium chloride 10 mL/hr at 10/08/22 0479    ceFAZolin (ANCEF) IV 2 g (10/08/22 0625)   fluconazole (DIFLUCAN) IV Stopped (10/07/22 1024)   heparin 1,200 Units/hr (10/08/22 0600)     LOS: 9 days    Time spent: *** minutes spent on chart review, discussion with nursing staff, consultants, updating family and interview/physical exam; more than 50% of that time was spent in counseling and/or coordination of care.   Kerney Elbe, DO [facility] Available via Epic secure chat 7am-7pm After these hours, please refer to coverage provider listed on [email or web address] 10/08/2022, 8:20 AM

## 2022-10-09 ENCOUNTER — Inpatient Hospital Stay (HOSPITAL_COMMUNITY): Payer: Medicare Other | Admitting: Anesthesiology

## 2022-10-09 ENCOUNTER — Encounter (HOSPITAL_COMMUNITY): Payer: Self-pay | Admitting: Internal Medicine

## 2022-10-09 ENCOUNTER — Encounter (HOSPITAL_COMMUNITY): Admission: EM | Disposition: E | Payer: Self-pay | Source: Home / Self Care | Attending: Internal Medicine

## 2022-10-09 DIAGNOSIS — E876 Hypokalemia: Secondary | ICD-10-CM | POA: Diagnosis not present

## 2022-10-09 DIAGNOSIS — F17210 Nicotine dependence, cigarettes, uncomplicated: Secondary | ICD-10-CM

## 2022-10-09 DIAGNOSIS — I509 Heart failure, unspecified: Secondary | ICD-10-CM

## 2022-10-09 DIAGNOSIS — I110 Hypertensive heart disease with heart failure: Secondary | ICD-10-CM

## 2022-10-09 DIAGNOSIS — D490 Neoplasm of unspecified behavior of digestive system: Secondary | ICD-10-CM | POA: Diagnosis not present

## 2022-10-09 DIAGNOSIS — R627 Adult failure to thrive: Secondary | ICD-10-CM | POA: Diagnosis not present

## 2022-10-09 DIAGNOSIS — C241 Malignant neoplasm of ampulla of Vater: Secondary | ICD-10-CM | POA: Diagnosis not present

## 2022-10-09 DIAGNOSIS — I2510 Atherosclerotic heart disease of native coronary artery without angina pectoris: Secondary | ICD-10-CM | POA: Diagnosis not present

## 2022-10-09 DIAGNOSIS — I5022 Chronic systolic (congestive) heart failure: Secondary | ICD-10-CM | POA: Diagnosis not present

## 2022-10-09 HISTORY — PX: FLEXIBLE SIGMOIDOSCOPY: SHX5431

## 2022-10-09 HISTORY — PX: BIOPSY: SHX5522

## 2022-10-09 LAB — CBC WITH DIFFERENTIAL/PLATELET
Abs Immature Granulocytes: 0.41 10*3/uL — ABNORMAL HIGH (ref 0.00–0.07)
Basophils Absolute: 0 10*3/uL (ref 0.0–0.1)
Basophils Relative: 0 %
Eosinophils Absolute: 0 10*3/uL (ref 0.0–0.5)
Eosinophils Relative: 0 %
HCT: 29.6 % — ABNORMAL LOW (ref 39.0–52.0)
Hemoglobin: 9.8 g/dL — ABNORMAL LOW (ref 13.0–17.0)
Immature Granulocytes: 3 %
Lymphocytes Relative: 10 %
Lymphs Abs: 1.2 10*3/uL (ref 0.7–4.0)
MCH: 33.3 pg (ref 26.0–34.0)
MCHC: 33.1 g/dL (ref 30.0–36.0)
MCV: 100.7 fL — ABNORMAL HIGH (ref 80.0–100.0)
Monocytes Absolute: 0.5 10*3/uL (ref 0.1–1.0)
Monocytes Relative: 4 %
Neutro Abs: 10.7 10*3/uL — ABNORMAL HIGH (ref 1.7–7.7)
Neutrophils Relative %: 83 %
Platelets: 143 10*3/uL — ABNORMAL LOW (ref 150–400)
RBC: 2.94 MIL/uL — ABNORMAL LOW (ref 4.22–5.81)
RDW: 15.9 % — ABNORMAL HIGH (ref 11.5–15.5)
WBC: 12.8 10*3/uL — ABNORMAL HIGH (ref 4.0–10.5)
nRBC: 0.2 % (ref 0.0–0.2)

## 2022-10-09 LAB — COMPREHENSIVE METABOLIC PANEL
ALT: 22 U/L (ref 0–44)
AST: 55 U/L — ABNORMAL HIGH (ref 15–41)
Albumin: 1.8 g/dL — ABNORMAL LOW (ref 3.5–5.0)
Alkaline Phosphatase: 285 U/L — ABNORMAL HIGH (ref 38–126)
Anion gap: 7 (ref 5–15)
BUN: 13 mg/dL (ref 8–23)
CO2: 20 mmol/L — ABNORMAL LOW (ref 22–32)
Calcium: 7.5 mg/dL — ABNORMAL LOW (ref 8.9–10.3)
Chloride: 109 mmol/L (ref 98–111)
Creatinine, Ser: 0.76 mg/dL (ref 0.61–1.24)
GFR, Estimated: >60 mL/min (ref >60)
Glucose, Bld: 141 mg/dL — ABNORMAL HIGH (ref 70–99)
Potassium: 3.8 mmol/L (ref 3.5–5.1)
Sodium: 136 mmol/L (ref 135–145)
Total Bilirubin: 4.8 mg/dL — ABNORMAL HIGH (ref 0.3–1.2)
Total Protein: 4.1 g/dL — ABNORMAL LOW (ref 6.5–8.1)

## 2022-10-09 LAB — GLUCOSE, CAPILLARY
Glucose-Capillary: 136 mg/dL — ABNORMAL HIGH (ref 70–99)
Glucose-Capillary: 141 mg/dL — ABNORMAL HIGH (ref 70–99)
Glucose-Capillary: 141 mg/dL — ABNORMAL HIGH (ref 70–99)
Glucose-Capillary: 147 mg/dL — ABNORMAL HIGH (ref 70–99)
Glucose-Capillary: 159 mg/dL — ABNORMAL HIGH (ref 70–99)
Glucose-Capillary: 194 mg/dL — ABNORMAL HIGH (ref 70–99)
Glucose-Capillary: 346 mg/dL — ABNORMAL HIGH (ref 70–99)

## 2022-10-09 LAB — PHOSPHORUS: Phosphorus: 2.1 mg/dL — ABNORMAL LOW (ref 2.5–4.6)

## 2022-10-09 LAB — MAGNESIUM: Magnesium: 1.8 mg/dL (ref 1.7–2.4)

## 2022-10-09 SURGERY — SIGMOIDOSCOPY, FLEXIBLE
Anesthesia: Monitor Anesthesia Care

## 2022-10-09 MED ORDER — LACTATED RINGERS IV SOLN
INTRAVENOUS | Status: AC | PRN
  Administered 2022-10-09: 1000 mL via INTRAVENOUS

## 2022-10-09 MED ORDER — DEXMEDETOMIDINE HCL IN NACL 200 MCG/50ML IV SOLN
INTRAVENOUS | Status: DC | PRN
  Administered 2022-10-09: 8 ug via INTRAVENOUS

## 2022-10-09 MED ORDER — PHENYLEPHRINE 80 MCG/ML (10ML) SYRINGE FOR IV PUSH (FOR BLOOD PRESSURE SUPPORT)
PREFILLED_SYRINGE | INTRAVENOUS | Status: DC | PRN
  Administered 2022-10-09: 80 ug via INTRAVENOUS

## 2022-10-09 MED ORDER — SODIUM CHLORIDE 0.9 % IV SOLN: INTRAVENOUS | Status: DC

## 2022-10-09 MED ORDER — K PHOS MONO-SOD PHOS DI & MONO 155-852-130 MG PO TABS
500.0000 mg | ORAL_TABLET | Freq: Two times a day (BID) | ORAL | Status: AC
  Administered 2022-10-09 (×2): 500 mg via ORAL
  Filled 2022-10-09 (×2): qty 2

## 2022-10-09 MED ORDER — PROPOFOL 10 MG/ML IV BOLUS
INTRAVENOUS | Status: DC | PRN
  Administered 2022-10-09: 10 mg via INTRAVENOUS
  Administered 2022-10-09 (×3): 20 mg via INTRAVENOUS

## 2022-10-09 NOTE — Progress Notes (Signed)
The events of yesterday have been noted.  He  Has some bright red blood per rectum.  The heparin was stopped.  Thankfully, his platelet count has recovered.  Yesterday platelet count was 140,000.  He is going to have a flexible sigmoidoscopy today.  He really needs some, anticoagulation.  I really would like to try to preserve the vascular integrity in his left hand.  I realize that he probably will have some amputations.  However, we can just keep anticoagulation going, we can hopefully keep the hand oxygenated.  I do not know if/when Vascular Surgery would want to do any type of amputation.  He is hungry.  He would like to eat.  He really looks a lot stronger.  He is not having any abdominal pain.  Yesterday, his bilirubin was 4.7.  This continues to improve.  His liver function studies are doing pretty well right now.  Given the improvement that I have seen, we clearly can help with this ampullary carcinoma.  Again, there is no evidence of disease elsewhere.  I think I would repeat his CEA level when his bilirubin drops little bit more to see if this is improved.  His Klebsiella sepsis certainly has resolved.  Again, he is hypercoagulable.  We really have to try to maintain anticoagulation.  Hopefully, the flexible sigmoidoscopy will just show may be diverticulosis.  I would probably try to restart the Heparin after his procedure and just keep the therapeutic level in the lower range of therapeutic.  Again, he really has improved in 1 week.  A lot has to do with the fact that this Klebsiella sepsis and the infected biliary stent have been treated.  Next week, we will decide as to when to try to treat the ampullary carcinoma.  Hopefully, we will see that the bilirubin continues to improve.  [name], MD  Psalm 46:1

## 2022-10-09 NOTE — Progress Notes (Signed)
PROGRESS NOTE    [name] [name]  DIY:[id number] DOB: 1956/02/26 DOA: 09/28/2022 PCP: Terrilyn Saver, NP   Brief Narrative:  [name] [name] is a 67 y.o. 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    Patient has a diagnosis of cholangiocarcinoma with a malignant biliary stricture status post stenting on 09/17/2022 treated with stent obstruction.  He is noted to have septic shock on admission and his WBC elevated to 38.7 on 10/01/2021.  He was given a dose of IV Unasyn and underwent repeat ERCP and stent replacement with findings of purulent discharge and cholangitis.  He was septic postprocedure with shock and PCCM was called for admission to the ICU.  He was admitted on 09/29/2021 and then had an ERCP on 10/01/2021 and transferred to the ICU with septic shock.  He is made DNR on 10/04/2021 after discussion with the family.  Repeat echocardiogram showed an EF of less than 20%.  He has swelling of her left upper extremity overnight and arterial duplex was ordered as well as a venous duplex.  Patient was subsequently weaned off of pressors from his septic shock.  The arterial duplex showed a left obstruction noted in the radial artery.  He was initiated on a heparin drip and the medical oncologist and vascular took him for an angiogram and he was found to have a high bifurcation of the radial artery with occlusion from the antecubital fossa to the wrist.  There is reconstitution of the radial artery via the interosseous artery and in the hand thrombosis of the digital arteries in the first second and third digits.  There is no flow in the digits and the palmar arch was intact.  Vascular surgery recommended hand surgery involvement and I called [name] who requested that  vascular surgery call him directly for consultation.  He was resumed on a heparin drip 6 hours after his procedure.   GI still following given his septic shock from Klebsiella.  They are recommending considering sigmoidoscopy at some point prior to discharge to evaluate the abnormal sigmoid wall thickening on the prior CT scan that was done without oral or IV contrast.  Given his improvement in his diarrhea and they are regular readdress it and reconsider flex sigmoidoscopy  due to some abnormal finding of the wall thickening in the sigmoid colon due to his other acute issues. Currently awaiting Hand Surgery evaluation and I spoke to the physician on-call today [name] who states he will be by in the morning given that the prior physician I was called and never came to evaluate the patient.   Patient was doing okay today and was on a heparin drip however started having some bright red clots with his bowel movements so his heparin drip was held and GI was reconsulted and the plan is to proceed with a flexible sigmoidoscopy with [name] in the morning  Assessment and Plan:  Septic shock secondary to cholangitis, shock resolved and sepsis has improved Biliary obstruction secondary to cholangiocarcinoma with Hyperbilirubinemia Klebsiella Bacteremia -Post ERCP with stent replacement -Shock is improving and he has been weaned off of pressors Recent Labs  Lab 10/04/22 0404 10/05/22 0201 10/07/2022 0315 10/07/22 0355 10/08/22 0328 10/08/22 1216 09/30/2022 1127  WBC 19.0* 14.4* 12.2* 13.5* 14.5* 17.1* 12.8*  -Bilirubin went from 6.6 ->  4.9 -> 4.7 -> 4.8 -C/w Ancef at least through 1/10 which will provide coverage through 5 days after source control -Continue to Monitor and Trend and Repeat CBC in the AM    Acute Respiratory Failure Secondary to Sepsis PT/OT, mobilize -SpO2: 96 % O2 Flow Rate (L/min): 2 L/min FiO2 (%): 40 % -Continuous pulse oximetry maintain O2 saturation greater  90% -Continue supplemental oxygen via nasal cannula wean O2 as tolerated -Will need an amatory home O2 screen prior to discharge as well as repeat chest x-ray   Chronic Heart Failure, nonischemic cardiomyopathy.   -EF less than 20%.  Reduction in cardiac function likely due to septic cardiomyopathy -Hold outpatient hypertension medications -C/w Telemetry -Hold diuretic in setting of pressor requirement, third spacing, currently weaned off of oxygen -Appears Euvolemic at this moment but I's and O's showing +8.210 Liters    Electrolyte abnormalities -Electrolyte Trend: Recent Labs  Lab 10/03/22 0352 10/04/22 0404 10/05/22 0201 09/28/2022 0315 10/11/2022 1800 10/07/22 0355 10/08/22 0328 10/25/2022 1127  NA 134* 135 136 134*  --  136 132* 136  K 3.2* 2.9* 3.1* 3.3*  --  3.2* 3.2* 3.8  MG  --   --  1.6* 1.9  --  1.9 1.9 1.8  PHOS  --   --   --  1.1*   < > 2.0* 2.0* 2.1*   < > = values in this interval not displayed.  -Replete  Phos with po K Phos 500 mg BID x2 -Continue to monitor and replete as necessary -Repeat electrolytes in the a.m.   LUE hand/Arm swelling -US DVT/arterial duplex done and showed a radial artery occlusion -Elevate, warm compresses -Vascular consulted and took the patient for angiogram -He was found to have a high bifurcation of the radial artery with occlusion from the antecubital fossa to the wrist.  There is reconstitution of the radial artery via the interosseous artery and in the hand thrombosis of the digital arteries in the first second and third digits.  There is no flow in the digits and the palmar arch was intact.  Vascular surgery recommended hand surgery involvement and I called [name] who requested that vascular surgery call him directly for consultation.  He was resumed on a heparin drip 6 hours after his procedure. -Per my discussion with vascular surgery he will likely need finger amputation and they will reach out to [name] directly; Dr.  Caralyn Guile number came to evaluate the patient so I called the hand surgeon on today and [name] will gladly see the patient in the morning -Patient on a heparin drip but this has been held given his GI bleeding -Awaiting Hand Surgery Evaluation but they are recommending allowing the fingers to demarcate and will take a look at the patient today    Esophageal Candidiasis -C/w Fluconazole 1/8   Thrombocytopenia -Possibly developing in setting acute UE DVT vs drug related vs sepsis.  -Hasn't had heparin exposure here until you start on a heparin drip this morning by the oncologist -Platelet Count Trend: Recent Labs  Lab 10/04/22 0404 10/05/22 0201 10/14/2022 0315 10/07/22 0355 10/08/22 0328 10/08/22 1216 10/22/2022 1127  PLT 69* 54* 62* 84* 117* 140* 143*  -Trend CBC    AKI on chronic kidney disease secondary to shock Metabolic Acidoiss -Contine to Monitor urine output and creatinine -BUN/Cr Trend: Recent Labs  Lab 10/03/22 0352 10/04/22 0404 10/05/22 0201 09/30/2022 0315 10/07/22 0355 10/08/22 0328 10/21/2022 1127  BUN 33* 26* '21 17 14 12 '$ 13  CREATININE 1.28* 1.01 0.93 0.90 0.91 0.81 0.76  -Avoid Nephrotoxic Medications, Contrast Dyes, Hypotension and Dehydration to Ensure Adequate Renal Perfusion and will need to Renally Adjust Meds -Patient has a Slight Metabolic Acidosis with a  CO2 of 20, AG of 7, and Chloride Level of 109 -Continue to Monitor and Trend Renal Function carefully and repeat CMP in the AM   Normocytic/Macrocytic Anemia -Hgb/Hct Trend: Recent Labs  Lab 10/04/22 0404 10/05/22 0201 09/30/2022 0315 10/07/22 0355 10/08/22 0328 10/08/22 1216 10/13/2022 1127  HGB 9.5* 10.7* 10.4* 9.9* 9.5* 11.2* 9.8*  HCT 27.0* 31.3* 30.6* 29.6* 28.1* 33.6* 29.6*  MCV 94.1 95.7 97.1 98.0 96.2 99.1 100.7*  -Check Anemia Panel in the AM -Continue to Monitor for S/Sx of Bleeding as patient had bloody stools yesterday -Heparin gtt on Hold for now -Patient going for a Flex  Sigmoidoscopy today -Defer to GI when to resume Heparin gtt as Oncology recommending resuming Heparin after his procedure and keeping the therapeutic level in the lower range.    Abnormal LFTs -LFT Trend: Recent Labs  Lab 09/30/2022 0428 10/03/22 0352 10/04/22 0404 10/05/22 0201 10/07/22 0355 10/08/22 0328 09/28/2022 1127  AST 420* 204* 128* 91* 53* 56* 55*  ALT 117* 110* 86* 54* '24 22 22  '$ -GI Following will continue to Monitor as LFTs are slowly declining   Likely ileus -Abdomen soft, +BS -Correct electrolyte abnormalities -Mobilize and he was given bowel regimen which is now stopped given the diarrhea which is now improving   Failure to thrive, severe protein calorie malnutrition present on admission -Advance to full diet   Diarrhea, improved but now had bloody bowel movements -Happening after his diagnosis and unclear of the CT scan findings were real artifact but GI still planning for a sigmoidoscopy prior to discharge to rule out any problems there -GI had signed the case given that his diarrhea has been improving but now given that he had a medium episode obstructed clot material and red blood earlier today the heparin drip was stopped and GI was reconsulted. -Repeat hemoglobin was stable -GI recommending a sigmoidoscopy and patient's daughters would like to proceed and plan is for a flexible sigmoidoscopy today 10/09/2021 with [name]   DVT prophylaxis: SCDs Start: 10/25/2022 1928    Code Status: DNR Family Communication: Discussed with friend at bedside  Disposition Plan:  Level of care: Stepdown Status is: Inpatient Remains inpatient appropriate because: His further clinical workup and evaluation and clearance by specialist including GI as well as medical oncology and evaluation by hand surgery   Consultants:  PCCM transfer Vascular surgery Gastroenterology Medical oncology Hand surgery -I requested a consult from [name] but he never came to see the patient so I  have consulted [name] who stated he will see the patient on 10/09/21  Procedures:  As delineated as above  Antimicrobials:  Anti-infectives (From admission, onward)    Start     Dose/Rate Route Frequency Ordered Stop   10/04/22 0900  [MAR Hold]  fluconazole (DIFLUCAN) IVPB 200 mg        (MAR Hold since Sat 10/27/2022 at 1239.Hold Reason: Transfer to a Procedural area)   200 mg 100 mL/hr over 60 Minutes Intravenous Every 24 hours 10/04/22 0719     10/03/22 1400  ceFAZolin (ANCEF) IVPB 2g/100 mL premix        2 g 200 mL/hr over 30 Minutes Intravenous Every 8 hours 10/03/22 1025 10/08/22 2359   10/11/2022 1800  vancomycin (VANCOCIN) IVPB 1000 mg/200 mL  premix  Status:  Discontinued        1,000 mg 200 mL/hr over 60 Minutes Intravenous Every 24 hours 10/04/2022 1823 10/26/2022 0801   09/30/2022 1000  fluconazole (DIFLUCAN) tablet 100 mg  Status:  Discontinued        100 mg Oral Daily 10/16/2022 1450 09/28/2022 1505   10/12/2022 0100  ceFEPIme (MAXIPIME) 2 g in sodium chloride 0.9 % 100 mL IVPB  Status:  Discontinued        2 g 200 mL/hr over 30 Minutes Intravenous Every 12 hours 10/22/2022 1823 10/03/22 1025   10/19/2022 2200  metroNIDAZOLE (FLAGYL) IVPB 500 mg  Status:  Discontinued        500 mg 100 mL/hr over 60 Minutes Intravenous Every 12 hours 10/05/2022 1823 10/03/22 1025   10/04/2022 1730  vancomycin (VANCOREADY) IVPB 1500 mg/300 mL        1,500 mg 150 mL/hr over 120 Minutes Intravenous  Once 10/03/2022 1642 10/08/2022 1951   10/19/2022 1600  piperacillin-tazobactam (ZOSYN) IVPB 3.375 g  Status:  Discontinued        3.375 g 12.5 mL/hr over 240 Minutes Intravenous Every 8 hours 10/03/2022 1451 10/19/2022 1821   10/08/2022 1500  ampicillin-sulbactam (UNASYN) 1.5 g in sodium chloride 0.9 % 100 mL IVPB  Status:  Discontinued        1.5 g 200 mL/hr over 30 Minutes Intravenous Every 8 hours 10/07/2022 1449 10/24/2022 1451   09/28/2022 1500  fluconazole (DIFLUCAN) tablet 200 mg  Status:  Discontinued        200 mg Oral   Once 10/14/2022 1450 09/28/2022 1505   10/03/2022 1300  ampicillin-sulbactam (UNASYN) 1.5 g in sodium chloride 0.9 % 100 mL IVPB        1.5 g 200 mL/hr over 30 Minutes Intravenous To Endoscopy 10/10/2022 1244 10/25/2022 1616       Subjective: Seen and examined at bedside he was doing okay and waiting for his flexible sigmoidoscopy.  States that he slept okay and denies any complaints.  No nausea or vomiting.  Thinks he is doing all right.  No other concerns or complaints at this time.  Objective: Vitals:   10/05/2022 1000 10/16/2022 1100 10/21/2022 1200 10/07/2022 1246  BP: (!) 154/93 125/60 (!) 145/84 (!) 143/84  Pulse: 70 83 71 72  Resp: '20 18 14 15  '$ Temp:    (!) 97.5 F (36.4 C)  TempSrc:    Tympanic  SpO2: 100% (!) 79% 98% 96%  Weight:    65.3 kg  Height:    '6\' 1"'$  (1.854 m)    Intake/Output Summary (Last 24 hours) at 10/03/2022 1332 Last data filed at 10/03/2022 0700 Gross per 24 hour  Intake 849.67 ml  Output 750 ml  Net 99.67 ml   Filed Weights   10/07/22 0500 10/08/22 0354 10/21/2022 1246  Weight: 66.9 kg 65.3 kg 65.3 kg   Examination: Physical Exam:  Constitutional: Thin cachectic and chronically ill-appearing older appearing than his stated age African-American male in no acute distress Respiratory: Diminished to auscultation bilaterally with coarse breath sounds, no wheezing, rales, rhonchi or crackles. Normal respiratory effort and patient is not tachypenic. No accessory muscle use.  Unlabored breathing Cardiovascular: RRR, no murmurs / rubs / gallops. S1 and S2 auscultated.  No appreciable extremity edema Abdomen: Soft, non-tender, non-distended.  Bowel sounds positive.  GU: Deferred. Musculoskeletal: No clubbing / cyanosis of digits/nails. No joint deformity upper and lower extremities. Good ROM, no contractures. Normal strength and muscle tone.  Skin: Left hand swollen and fingers are starting to turn black Neurologic: CN 2-12 grossly intact with no focal deficits.  Romberg sign  and cerebellar reflexes not assessed.  Psychiatric: Normal judgment and insight. Alert and oriented x 3. Normal mood and appropriate affect.   Data Reviewed: I have personally reviewed following labs and imaging studies  CBC: Recent Labs  Lab 10/05/22 0201 10/13/2022 0315 10/07/22 0355 10/08/22 0328 10/08/22 1216 10/05/2022 1127  WBC 14.4* 12.2* 13.5* 14.5* 17.1* 12.8*  NEUTROABS 12.5*  --  11.1* 11.9* 14.5* 10.7*  HGB 10.7* 10.4* 9.9* 9.5* 11.2* 9.8*  HCT 31.3* 30.6* 29.6* 28.1* 33.6* 29.6*  MCV 95.7 97.1 98.0 96.2 99.1 100.7*  PLT 54* 62* 84* 117* 140* 301*   Basic Metabolic Panel: Recent Labs  Lab 10/05/22 0201 10/08/2022 0315 10/12/2022 1800 10/07/22 0355 10/08/22 0328 10/13/2022 1127  NA 136 134*  --  136 132* 136  K 3.1* 3.3*  --  3.2* 3.2* 3.8  CL 109 107  --  110 107 109  CO2 18* 18*  --  19* 18* 20*  GLUCOSE 145* 153*  --  143* 124* 141*  BUN 21 17  --  '14 12 13  '$ CREATININE 0.93 0.90  --  0.91 0.81 0.76  CALCIUM 7.7* 7.4*  --  7.4* 7.3* 7.5*  MG 1.6* 1.9  --  1.9 1.9 1.8  PHOS  --  1.1* 2.4* 2.0* 2.0* 2.1*   GFR: Estimated Creatinine Clearance: 82.8 mL/min (by C-G formula based on SCr of 0.76 mg/dL). Liver Function Tests: Recent Labs  Lab 10/04/22 0404 10/05/22 0201 10/07/22 0355 10/08/22 0328 09/27/2022 1127  AST 128* 91* 53* 56* 55*  ALT 86* 54* '24 22 22  '$ ALKPHOS 529* 462* 394* 346* 285*  BILITOT 7.7* 6.6* 4.9* 4.7* 4.8*  PROT 4.7* 4.7* 4.6* 4.5* 4.1*  ALBUMIN 1.6* 1.6* 1.8* 1.8* 1.8*   No results for input(s): "LIPASE", "AMYLASE" in the last 168 hours. No results for input(s): "AMMONIA" in the last 168 hours. Coagulation Profile: No results for input(s): "INR", "PROTIME" in the last 168 hours. Cardiac Enzymes: No results for input(s): "CKTOTAL", "CKMB", "CKMBINDEX", "TROPONINI" in the last 168 hours. BNP (last 3 results) Recent Labs    09/22/22 1141  PROBNP 835.0*   HbA1C: No results for input(s): "HGBA1C" in the last 72 hours. CBG: Recent Labs   Lab 10/08/22 1927 10/23/2022 0024 09/28/2022 0410 09/28/2022 0753 09/30/2022 1127  GLUCAP 237* 346* 136* 141* 147*   Lipid Profile: Recent Labs    10/07/22 0355  CHOL 204*  HDL 19*  LDLCALC 158*  TRIG 134  CHOLHDL 10.7   Thyroid Function Tests: No results for input(s): "TSH", "T4TOTAL", "FREET4", "T3FREE", "THYROIDAB" in the last 72 hours. Anemia Panel: Recent Labs    10/08/22 0328  [id number]   Sepsis Labs: Recent Labs  Lab 10/03/22 0352  LATICACIDVEN 1.4   Recent Results (from the past 240 hour(s))  C Difficile Quick Screen w PCR reflex     Status: None   Collection Time: 09/28/2022  6:55 AM   Specimen: STOOL  Result Value Ref Range Status   C Diff antigen NEGATIVE NEGATIVE Final   C Diff toxin NEGATIVE NEGATIVE Final   C Diff interpretation No C. difficile detected.  Final    Comment: Performed at [facility], [place] [address], [place], [place] [address]  Gastrointestinal Panel by PCR , Stool     Status: None   Collection Time: 09/27/2022  6:55 AM  Result Value Ref Range Status   Campylobacter species NOT DETECTED NOT DETECTED Final   Plesimonas shigelloides NOT DETECTED NOT DETECTED Final   Salmonella species NOT DETECTED NOT DETECTED Final   Yersinia enterocolitica NOT DETECTED NOT DETECTED Final   Vibrio species NOT DETECTED NOT DETECTED Final   Vibrio cholerae NOT DETECTED NOT DETECTED Final   Enteroaggregative E coli (EAEC) NOT DETECTED NOT DETECTED Final   Enteropathogenic E coli (EPEC) NOT DETECTED NOT DETECTED Final   Enterotoxigenic E coli (ETEC) NOT DETECTED NOT DETECTED Final   Shiga like toxin producing E coli (STEC) NOT DETECTED NOT DETECTED Final   Shigella/Enteroinvasive E coli (EIEC) NOT DETECTED NOT DETECTED Final   Cryptosporidium NOT DETECTED NOT DETECTED Final   Cyclospora cayetanensis NOT DETECTED NOT DETECTED Final   Entamoeba histolytica NOT DETECTED NOT DETECTED Final   Giardia lamblia NOT DETECTED NOT DETECTED Final    Adenovirus F40/41 NOT DETECTED NOT DETECTED Final   Astrovirus NOT DETECTED NOT DETECTED Final   Norovirus GI/GII NOT DETECTED NOT DETECTED Final   Rotavirus A NOT DETECTED NOT DETECTED Final   Sapovirus (I, II, IV, and V) NOT DETECTED NOT DETECTED Final    Comment: Performed at [facility], [place] [place], [place] [address]  Culture, blood (Routine X 2) w Reflex to ID Panel     Status: Abnormal   Collection Time: 10/13/2022  8:41 AM   Specimen: BLOOD LEFT HAND  Result Value Ref Range Status   Specimen Description   Final    BLOOD LEFT HAND Performed at [facility], [place] [address], [place], [place] [address]    Special Requests   Final    BOTTLES DRAWN AEROBIC ONLY Blood Culture adequate volume Performed at [place] [address] [place], [place] [address]    Culture  Setup Time   Final    GRAM NEGATIVE RODS AEROBIC BOTTLE ONLY Organism ID to follow CRITICAL RESULT CALLED TO, READ BACK BY AND VERIFIED WITH: M LILLISTON,PHARMD'@0012'$  10/21/2022 [place] Performed at [facility], [place] [address] [place], [place] [address]    Culture KLEBSIELLA PNEUMONIAE (A)  Final   Report Status 10/03/2022 FINAL  Final   Organism ID, Bacteria KLEBSIELLA PNEUMONIAE  Final      Susceptibility   Klebsiella pneumoniae - MIC*    AMPICILLIN RESISTANT Resistant     CEFAZOLIN <=4 SENSITIVE Sensitive     CEFEPIME <=0.12 SENSITIVE Sensitive     CEFTAZIDIME <=1 SENSITIVE Sensitive     CEFTRIAXONE <=0.25 SENSITIVE Sensitive     CIPROFLOXACIN <=0.25 SENSITIVE Sensitive     GENTAMICIN <=1 SENSITIVE Sensitive     IMIPENEM <=0.25 SENSITIVE Sensitive     TRIMETH/SULFA <=20 SENSITIVE Sensitive     AMPICILLIN/SULBACTAM <=2 SENSITIVE Sensitive     PIP/TAZO <=4 SENSITIVE Sensitive     * KLEBSIELLA PNEUMONIAE  Culture, blood (Routine X 2) w Reflex to ID Panel     Status: Abnormal   Collection Time: 10/13/2022  8:41 AM   Specimen: BLOOD RIGHT ARM   Result Value Ref Range Status   Specimen Description   Final    BLOOD RIGHT ARM Performed at [place] [address] [place], [place] [address]    Special Requests   Final    BOTTLES DRAWN AEROBIC ONLY Blood Culture adequate volume Performed at [place] [address], [place], [place] [address]    Culture  Setup Time   Final  GRAM NEGATIVE RODS BOTTLES DRAWN AEROBIC ONLY CRITICAL VALUE NOTED.  VALUE IS CONSISTENT WITH PREVIOUSLY REPORTED AND CALLED VALUE.    Culture (A)  Final    KLEBSIELLA PNEUMONIAE SUSCEPTIBILITIES PERFORMED ON PREVIOUS CULTURE WITHIN THE LAST 5 DAYS. Performed at [facility], [place] [address], [place], [place] [address]    Report Status 10/03/2022 FINAL  Final  Blood Culture ID Panel (Reflexed)     Status: Abnormal   Collection Time: 10/05/2022  8:41 AM  Result Value Ref Range Status   Enterococcus faecalis NOT DETECTED NOT DETECTED Final   Enterococcus Faecium NOT DETECTED NOT DETECTED Final   Listeria monocytogenes NOT DETECTED NOT DETECTED Final   Staphylococcus species NOT DETECTED NOT DETECTED Final   Staphylococcus aureus (BCID) NOT DETECTED NOT DETECTED Final   Staphylococcus epidermidis NOT DETECTED NOT DETECTED Final   Staphylococcus lugdunensis NOT DETECTED NOT DETECTED Final   Streptococcus species NOT DETECTED NOT DETECTED Final   Streptococcus agalactiae NOT DETECTED NOT DETECTED Final   Streptococcus pneumoniae NOT DETECTED NOT DETECTED Final   Streptococcus pyogenes NOT DETECTED NOT DETECTED Final   A.calcoaceticus-baumannii NOT DETECTED NOT DETECTED Final   Bacteroides fragilis NOT DETECTED NOT DETECTED Final   Enterobacterales DETECTED (A) NOT DETECTED Final    Comment: Enterobacterales represent a large order of gram negative bacteria, not a single organism. CRITICAL RESULT CALLED TO, READ BACK BY AND VERIFIED WITH: M LILLISTON,PHARMD'@0012'$  10/08/2022 [place]    Enterobacter cloacae  complex NOT DETECTED NOT DETECTED Final   Escherichia coli NOT DETECTED NOT DETECTED Final   Klebsiella aerogenes NOT DETECTED NOT DETECTED Final   Klebsiella oxytoca NOT DETECTED NOT DETECTED Final   Klebsiella pneumoniae DETECTED (A) NOT DETECTED Final    Comment: CRITICAL RESULT CALLED TO, READ BACK BY AND VERIFIED WITH: M LILLISTON,PHARMD'@0012'$  10/03/2022 [place]    Proteus species NOT DETECTED NOT DETECTED Final   Salmonella species NOT DETECTED NOT DETECTED Final   Serratia marcescens NOT DETECTED NOT DETECTED Final   Haemophilus influenzae NOT DETECTED NOT DETECTED Final   Neisseria meningitidis NOT DETECTED NOT DETECTED Final   Pseudomonas aeruginosa NOT DETECTED NOT DETECTED Final   Stenotrophomonas maltophilia NOT DETECTED NOT DETECTED Final   Candida albicans NOT DETECTED NOT DETECTED Final   Candida auris NOT DETECTED NOT DETECTED Final   Candida glabrata NOT DETECTED NOT DETECTED Final   Candida krusei NOT DETECTED NOT DETECTED Final   Candida parapsilosis NOT DETECTED NOT DETECTED Final   Candida tropicalis NOT DETECTED NOT DETECTED Final   Cryptococcus neoformans/gattii NOT DETECTED NOT DETECTED Final   CTX-M ESBL NOT DETECTED NOT DETECTED Final   Carbapenem resistance IMP NOT DETECTED NOT DETECTED Final   Carbapenem resistance KPC NOT DETECTED NOT DETECTED Final   Carbapenem resistance NDM NOT DETECTED NOT DETECTED Final   Carbapenem resist OXA 48 LIKE NOT DETECTED NOT DETECTED Final   Carbapenem resistance VIM NOT DETECTED NOT DETECTED Final    Comment: Performed at [facility], [address], [place], [place] [address]  MRSA Next Gen by PCR, Nasal     Status: None   Collection Time: 10/13/2022  3:59 PM   Specimen: Nasal Mucosa; Nasal Swab  Result Value Ref Range Status   MRSA by PCR Next Gen NOT DETECTED NOT DETECTED Final    Comment: (NOTE) The GeneXpert MRSA Assay (FDA approved for NASAL specimens only), is one component of a comprehensive MRSA colonization  surveillance program. It is not intended to diagnose MRSA infection nor to guide  or monitor treatment for MRSA infections. Test performance is not FDA approved in patients less than 8 years old. Performed at [facility], [place] [address], [place], [place] [address]   Urine Culture     Status: None   Collection Time: 09/27/2022  4:50 PM   Specimen: Urine, Catheterized  Result Value Ref Range Status   Specimen Description   Final    URINE, CATHETERIZED Performed at [place] [address], [place], [place] [address]    Special Requests   Final    NONE Performed at [facility], [place] [address], [place], [place] [address]    Culture   Final    NO GROWTH Performed at [place] [facility], [facility] [address], [place], [place] [address]    Report Status 10/03/2022 FINAL  Final    Radiology Studies: No results found.  Scheduled Meds:  [MAR Hold] Chlorhexidine Gluconate Cloth  6 each Topical Daily   [MAR Hold] digoxin  0.125 mg Oral Daily   [MAR Hold] feeding supplement  1 Container Oral TID BM   [MAR Hold] hydrocortisone sod succinate (SOLU-CORTEF) inj  100 mg Intravenous Q8H   [MAR Hold] insulin aspart  0-9 Units Subcutaneous Q4H   [MAR Hold] multivitamin with minerals  1 tablet Oral Daily   [MAR Hold] potassium & sodium phosphates  1 packet Oral TID WC & HS   [MAR Hold] sodium chloride flush  3 mL Intravenous Q12H   Continuous Infusions:  sodium chloride     [MAR Hold] sodium chloride Stopped (10/08/22 1055)   sodium chloride     [MAR Hold] fluconazole (DIFLUCAN) IV 200 mg (09/28/2022 0826)    LOS: 10 days   Raiford Noble, DO [facility] Available via Epic secure chat 7am-7pm After these hours, please refer to coverage provider listed on [email or web address] 10/08/2022, 1:32 PM

## 2022-10-09 NOTE — Anesthesia Preprocedure Evaluation (Addendum)
Anesthesia Evaluation  Patient identified by MRN, date of birth, ID band Patient awake    Reviewed: Allergy & Precautions, NPO status , Patient's Chart, lab work & pertinent test results  Airway Mallampati: II  TM Distance: >3 FB     Dental no notable dental hx.    Pulmonary Current Smoker and Patient abstained from smoking.   Pulmonary exam normal        Cardiovascular hypertension, Pt. on medications and Pt. on home beta blockers + CAD, + Peripheral Vascular Disease and +CHF  + Cardiac Defibrillator  Rhythm:Regular Rate:Normal  ECHO 1/24:  1. Left ventricular ejection fraction, by estimation, is <20%. The left  ventricle has severely decreased function. The left ventricle demonstrates  global hypokinesis. The left ventricular internal cavity size was severely  dilated. There is mild  concentric left ventricular hypertrophy. Left ventricular diastolic  parameters are indeterminate.   2. Right ventricular systolic function is severely reduced. The right  ventricular size is mildly enlarged. There is normal pulmonary artery  systolic pressure.   3. Left atrial size was mildly dilated.   4. No evidence of mitral valve regurgitation.   5. Tricuspid valve regurgitation is mild to moderate.   6. The aortic valve is tricuspid. Aortic valve regurgitation is not  visualized.   7. The inferior vena cava is normal in size with greater than 50%  respiratory variability, suggesting right atrial pressure of 3 mmHg.     Neuro/Psych negative neurological ROS  negative psych ROS   GI/Hepatic Neg liver ROS,GERD  Medicated,,Rectal bleeding    Endo/Other  negative endocrine ROS    Renal/GU   negative genitourinary   Musculoskeletal negative musculoskeletal ROS (+)    Abdominal Normal abdominal exam  (+)   Peds  Hematology negative hematology ROS (+) Lab Results      Component                Value               Date                       WBC                      12.8 (H)            10/27/2022                HGB                      9.8 (L)             10/17/2022                HCT                      29.6 (L)            10/08/2022                MCV                      100.7 (H)           10/17/2022                PLT                      143 (L)  10/10/2022             Lab Results      Component                Value               Date                      NA                       136                 10/18/2022                K                        3.8                 10/26/2022                CO2                      20 (L)              10/04/2022                GLUCOSE                  141 (H)             10/17/2022                BUN                      13                  10/04/2022                CREATININE               0.76                10/22/2022                CALCIUM                  7.5 (L)             10/17/2022                GFRNONAA                 >60                 09/28/2022              Anesthesia Other Findings   Reproductive/Obstetrics                             Anesthesia Physical Anesthesia Plan  ASA: 4  Anesthesia Plan: MAC   Post-op Pain Management:    Induction: Intravenous  PONV Risk Score and Plan: 1 and Treatment may vary due to age or medical condition  Airway Management Planned: Simple Face Mask, Natural Airway and Nasal Cannula  Additional Equipment: None  Intra-op Plan:   Post-operative Plan:   Informed Consent: I have reviewed the patients History and Physical, chart, labs and discussed the procedure including  the risks, benefits and alternatives for the proposed anesthesia with the patient or authorized representative who has indicated his/her understanding and acceptance.     Dental advisory given  Plan Discussed with: CRNA  Anesthesia Plan Comments:        Anesthesia Quick Evaluation

## 2022-10-09 NOTE — Anesthesia Procedure Notes (Signed)
Procedure Name: MAC Date/Time: 09/28/2022 1:20 PM  Performed by: Renato Shin, CRNAPre-anesthesia Checklist: Patient identified, Emergency Drugs available, Suction available and Patient being monitored Patient Re-evaluated:Patient Re-evaluated prior to induction Oxygen Delivery Method: Simple face mask Preoxygenation: Pre-oxygenation with 100% oxygen Induction Type: IV induction Placement Confirmation: positive ETCO2 and breath sounds checked- equal and bilateral Dental Injury: Teeth and Oropharynx as per pre-operative assessment

## 2022-10-09 NOTE — Progress Notes (Signed)
Palliative care following for any needs. At this time [name] [name] is improving and his symptoms are well managed. 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 He is significantly deconditioned and malnourished with an albumin of 1.8. At this time will sign off- if his condition changes our team is available to re-engage with patient and family.. Please consider referral for outpatient palliative services if he discharges from the hospital.  Lane Hacker, DO Palliative Medicine

## 2022-10-09 NOTE — Anesthesia Postprocedure Evaluation (Signed)
Anesthesia Post Note  Patient: [name] [name]  Procedure(s) Performed: [place]     Patient location during evaluation: Endoscopy Anesthesia Type: MAC Level of consciousness: awake and alert Pain management: pain level controlled Vital Signs Assessment: post-procedure vital signs reviewed and stable Respiratory status: spontaneous breathing, nonlabored ventilation, respiratory function stable and patient connected to nasal cannula oxygen Cardiovascular status: stable and blood pressure returned to baseline Postop Assessment: no apparent nausea or vomiting Anesthetic complications: no   No notable events documented.  Last Vitals:  Vitals:   10/18/2022 1340 10/26/2022 1345  BP: (!) 116/55   Pulse: 61 71  Resp: 18 19  Temp:    SpO2: 100% 98%    Last Pain:  Vitals:   10/13/2022 1340  TempSrc:   PainSc: 0-No pain                 Belenda Cruise P Austen Wygant

## 2022-10-09 NOTE — Op Note (Signed)
[facility] Patient Name: [name] [name] Procedure Date: 10/16/2022 MRN: [id number] Attending MD: Carol Ada , MD, [id number] Date of Birth: September 08, 1956 CSN: [id number] Age: 67 Admit Type: Inpatient Procedure:                Flexible Sigmoidoscopy Indications:              Abnormal CT of the GI tract Providers:                Carol Ada, MD, Doristine Johns, RN, Ladona Ridgel, Technician Referring MD:              Medicines:                Propofol per Anesthesia Complications:            No immediate complications. Estimated Blood Loss:     Estimated blood loss: none. Procedure:                Pre-Anesthesia Assessment:                           - Prior to the procedure, a History and Physical                            was performed, and patient medications and                            allergies were reviewed. The patient's tolerance of                            previous anesthesia was also reviewed. The risks                            and benefits of the procedure and the sedation                            options and risks were discussed with the patient.                            All questions were answered, and informed consent                            was obtained. Prior Anticoagulants: The patient has                            taken no anticoagulant or antiplatelet agents. ASA                            Grade Assessment: III - A patient with severe                            systemic disease. After reviewing the risks and  benefits, the patient was deemed in satisfactory                            condition to undergo the procedure.                           - Sedation was administered by an anesthesia                            professional. Deep sedation was attained.                           After obtaining informed consent, the scope was                            passed under direct  vision. The [id number]-H190 ([id number])                            Olympus endoscope was introduced through the anus                            and advanced to the the descending colon. The                            flexible sigmoidoscopy was technically difficult                            and complex. The quality of the bowel preparation                            was adequate. Scope In: 1:19:19 PM Scope Out: 1:28:20 PM Total Procedure Duration: 0 hours 9 minutes 1 second  Findings:      A frond-like/villous, polypoid and ulcerated non-obstructing large mass       was found in the sigmoid colon. The mass was circumferential. The mass       measured four cm in length. No bleeding was present. This was biopsied       with a cold forceps for histology.      A large 4 cm nonobstructing circumfirential mass was noted in the       sigmoid colon. The lesion was friable and there was some evidence of       ulceration. Multiple cold biopsies were obtained. In the rectum and in       portions of the descending colon, some ulcerations were noted. Impression:               - Likely malignant tumor in the sigmoid colon.                            Biopsied. Moderate Sedation:      Not Applicable - Patient had care per Anesthesia. Recommendation:           - Return patient to hospital ward for ongoing care.                           - Resume regular diet.                           -  Await pathology results. Procedure Code(s):        --- Professional ---                           [phone], Sigmoidoscopy, flexible; with biopsy, single                            or multiple Diagnosis Code(s):        --- Professional ---                           D49.0, Neoplasm of unspecified behavior of                            digestive system                           R93.3, Abnormal findings on diagnostic imaging of                            other parts of digestive tract CPT copyright 2022 American Medical Association.  All rights reserved. The codes documented in this report are preliminary and upon coder review may  be revised to meet current compliance requirements. Carol Ada, MD Carol Ada, MD 10/05/2022 1:39:39 PM This report has been signed electronically. Number of Addenda: 0

## 2022-10-09 NOTE — Progress Notes (Signed)
OT Cancellation Note  Patient Details Name: [name] [name] MRN: [id number] DOB: 08-22-1956   Cancelled Treatment:    Reason Eval/Treat Not Completed: Patient at procedure or test/ unavailable  [name] 10/20/2022, 1:43 PM

## 2022-10-09 NOTE — Consult Note (Signed)
HAND SURGERY CONSULTATION  REQUESTING PHYSICIAN: Kerney Elbe, DO   HPI: [name] [name] is a 67 y.o. male w/ multiple medical issues including chronic systolic CHF (EF 32%), CAD, PAD, HLD, and ampullary carcinoma admitted with hyperbilirubinemia and failure to thrive now w/ ischemia of the left hand.  He was admitted to the ICU in septic shock following ERCP and biliary stenting on 10/01/21.  A radial arterial line was placed at that time and removed a few days later.  He began to complain of left hand pain shortly thereafter with progressive discoloration of the hand.  He was evaluated by vascular surgery on 10/05/21 and underwent angiogram on 1/10.  The study noted occlusion of the radial artery from the level of the AC fossa to the level of the wrist.  The ulnar and interosseous arteries were patent with normal flow with the interosseous artery reconstituting the radial artery at the wrist. The palmar arch was intact w/ intact digital arteries to the ring and small finger with occlusion of the digital arteries to the thumb, index, and middle fingers.  He denies any pain in the hand today.  He says that he has "some" sensation in each finger.     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   Chronic systolic CHF (congestive heart failure) ([place]) 01/31/2015   Coronary artery disease    Hypertension    Nonischemic cardiomyopathy ([place]) 06/30/2015   Past Surgical History:  Procedure Laterality Date   AORTIC ARCH ANGIOGRAPHY N/A 09/30/2022   Procedure: AORTIC ARCH ANGIOGRAPHY;  Surgeon: Broadus John, MD;  Location: [place] [facility] LAB;  Service: Cardiovascular;  Laterality: N/A;   BILIARY BRUSHING  09/17/2022   Procedure: BILIARY BRUSHING;  Surgeon: Jackquline Denmark, MD;  Location: [facility] ENDOSCOPY;  Service: Gastroenterology;;   BILIARY STENT PLACEMENT  09/17/2022   Procedure: BILIARY STENT PLACEMENT;   Surgeon: Jackquline Denmark, MD;  Location: [place];  Service: Gastroenterology;;   BILIARY STENT PLACEMENT N/A 10/17/2022   Procedure: BILIARY STENT PLACEMENT;  Surgeon: Irving Copas., MD;  Location: [facility];  Service: Gastroenterology;  Laterality: N/A;   CARDIAC CATHETERIZATION N/A 02/04/2015   Procedure: Right/Left Heart Cath and Coronary Angiography;  Surgeon: Larey Dresser, MD;  Location: [place] [facility];  Service: Cardiovascular;  Laterality: N/A;   CARDIAC DEFIBRILLATOR PLACEMENT  07/09/2015   CHALAZION EXCISION Left 01/16/2014   Procedure: MINOR EXCISION OF CHALAZION;  Surgeon: Myrtha Mantis., MD;  Location: [place];  Service: Ophthalmology;  Laterality: Left;   EP IMPLANTABLE DEVICE N/A 07/09/2015   Procedure: ICD Implant;  Surgeon: Deboraha Sprang, MD;  Location: [place] [facility];  Service: Cardiovascular;  Laterality: N/A;   ERCP N/A 09/17/2022   Procedure: ENDOSCOPIC RETROGRADE CHOLANGIOPANCREATOGRAPHY (ERCP);  Surgeon: Jackquline Denmark, MD;  Location: [facility] ENDOSCOPY;  Service: Gastroenterology;  Laterality: N/A;   ERCP N/A 10/16/2022   Procedure: ENDOSCOPIC RETROGRADE CHOLANGIOPANCREATOGRAPHY (ERCP);  Surgeon: Irving Copas., MD;  Location: [facility];  Service: Gastroenterology;  Laterality: N/A;   ESOPHAGEAL BRUSHING  10/08/2022   Procedure: ESOPHAGEAL BRUSHING;  Surgeon: Rush Landmark Telford Nab., MD;  Location: [facility] ENDOSCOPY;  Service: Gastroenterology;;   EYE SURGERY Left ~ 2013   "eye infection"   REMOVAL OF STONES  10/08/2022   Procedure: REMOVAL OF STONES;  Surgeon: Irving Copas., MD;  Location: [facility];  Service: Gastroenterology;;   Joan Mayans  09/17/2022   Procedure:  SPHINCTEROTOMY;  Surgeon: Jackquline Denmark, MD;  Location: [facility];  Service: Gastroenterology;;   Lavell Islam REMOVAL  10/11/2022   Procedure: Lavell Islam REMOVAL;  Surgeon: Irving Copas., MD;  Location: [facility];  Service: Gastroenterology;;    UPPER EXTREMITY ANGIOGRAPHY Left 10/08/2022   Procedure: Upper Extremity Angiography;  Surgeon: Broadus John, MD;  Location: [place] [facility];  Service: Cardiovascular;  Laterality: Left;   Social History   Socioeconomic History   Marital status: Single    Spouse name: Not on file   Number of children: Not on file   Years of education: Not on file   Highest education level: Not on file  Occupational History   Not on file  Tobacco Use   Smoking status: Every Day    Packs/day: 1.00    Years: 40.00    Total pack years: 40.00    Types: Cigarettes   Smokeless tobacco: Never  Vaping Use   Vaping Use: Every day  Substance and Sexual Activity   Alcohol use: Not Currently    Comment: 07/09/2015 "I haven't had a drink since last of 12/2014"   Drug use: Not Currently    Types: "Crack" cocaine, Marijuana    Comment: 07/09/2015 "I did drugs a long time;ago  quit in the mid 1980's"   Sexual activity: Not Currently  Other Topics Concern   Not on file  Social History Narrative   Not on file   Social Determinants of Health   Financial Resource Strain: Not on file  Food Insecurity: Food Insecurity Present (09/30/2022)   Hunger Vital Sign    Worried About Running Out of Food in the Last Year: Never true    Ran Out of Food in the Last Year: Sometimes true  Transportation Needs: No Transportation Needs (09/30/2022)   PRAPARE - Hydrologist (Medical): No    Lack of Transportation (Non-Medical): No  Physical Activity: Not on file  Stress: Not on file  Social Connections: Not on file   Family History  Problem Relation Age of Onset   Stroke Mother    Heart Problems Other    - negative except otherwise stated in the family history section No Known Allergies Prior to Admission medications   Medication Sig Start Date End Date Taking? Authorizing Provider  aspirin 81 MG EC tablet TAKE 1 TABLET(81 MG) BY MOUTH DAILY. SWALLOW WHOLE Patient taking  differently: Take 81 mg by mouth daily. TAKE 1 TABLET(81 MG) BY MOUTH DAILY. SWALLOW WHOLE 11/04/21  Yes Larey Dresser, MD  carvedilol (COREG) 3.125 MG tablet Take 1 tablet (3.125 mg total) by mouth 2 (two) times daily with a meal. 09/19/22  Yes Thurnell Lose, MD  ezetimibe (ZETIA) 10 MG tablet TAKE 1 TABLET(10 MG) BY MOUTH DAILY 11/04/21  Yes Larey Dresser, MD  isosorbide mononitrate (IMDUR) 30 MG 24 hr tablet Take 0.5 tablets (15 mg total) by mouth daily. 09/20/22  Yes Thurnell Lose, MD  pantoprazole (PROTONIX) 40 MG tablet Take 1 tablet (40 mg total) by mouth daily. Patient taking differently: Take 40 mg by mouth daily as needed (indigestion). 09/20/22  Yes Thurnell Lose, MD  rosuvastatin (CRESTOR) 40 MG tablet TAKE 1 TABLET(40 MG) BY MOUTH DAILY 11/04/21  Yes Larey Dresser, MD  dapagliflozin propanediol (FARXIGA) 10 MG TABS tablet Take 1 tablet (10 mg total) by mouth daily before breakfast. Patient not taking: Reported on 10/10/2022 11/04/21   Larey Dresser, MD  digoxin (LANOXIN) 0.125  MG tablet Take 1 tablet (0.125 mg total) by mouth daily. Patient not taking: Reported on 09/28/2022 11/04/21   Larey Dresser, MD  triamcinolone ointment (KENALOG) 0.5 % Apply 1 application topically 2 (two) times daily. Patient not taking: Reported on 10/08/2022 07/11/18   Clent Demark, PA-C   No results found. - Positive ROS: All other systems have been reviewed and were otherwise negative with the exception of those mentioned in the HPI and as above.  Physical Exam: General: No acute distress, resting comfortably Cardiovascular: Tachycardic, RUE warm and well perfused, left hand warm but with cold fingers, 2+ radial artery palpable bilaterally Respiratory: Normal WOB on RA Skin: Warm and dry Neurologic: Sensation intact distally Psychiatric: Patient is at baseline mood and affect  Left Upper Extremity  Dusky discoloration of all fingers that is more pronounced distally.  His nail bed  tissue is black.  He has blistering of the dorsum of the thumb through small finger.  He is able to flex and extend all fingers and make a near complete fist but limited by swelling.  No TTP throughout arm or hand.  The palm and dorsum of the hand is warm but all fingers are cool.  He does have a palpable radial pulse.    Assessment: 67 yo M w/ above PMH with left hand ischemia secondary to digital vessel occlusion.  Plan: - No plans for surgical intervention at this point.  He will likely require amputation of multiple fingers.  We will continue to monitor the fingers for now to allow for demarcation and ensure appropriate amputation level.    Thank you for the consult and the opportunity to see Mr. Dot Been, M.D. [facility] 6:29 PM

## 2022-10-09 NOTE — Transfer of Care (Signed)
Immediate Anesthesia Transfer of Care Note  Patient: [name] [name]  Procedure(s) Performed: FLEXIBLE SIGMOIDOSCOPY BIOPSY  Patient Location: PACU and Endoscopy Unit  Anesthesia Type:MAC  Level of Consciousness: awake and patient cooperative  Airway & Oxygen Therapy: Patient Spontanous Breathing and Patient connected to face mask oxygen  Post-op Assessment: Report given to RN and Post -op Vital signs reviewed and stable  Post vital signs: Reviewed and stable  Last Vitals:  Vitals Value Taken Time  BP 125/52 10/12/2022 1335  Temp    Pulse 61 09/27/2022 1337  Resp 15 10/22/2022 1337  SpO2 100 % 10/03/2022 1337  Vitals shown include unvalidated device data.  Last Pain:  Vitals:   10/19/2022 1246  TempSrc: Tympanic  PainSc: 0-No pain      Patients Stated Pain Goal: 0 (82/57/49 3552)  Complications: No notable events documented.

## 2022-10-10 DIAGNOSIS — E876 Hypokalemia: Secondary | ICD-10-CM | POA: Diagnosis not present

## 2022-10-10 DIAGNOSIS — K6389 Other specified diseases of intestine: Secondary | ICD-10-CM

## 2022-10-10 DIAGNOSIS — C241 Malignant neoplasm of ampulla of Vater: Secondary | ICD-10-CM | POA: Diagnosis not present

## 2022-10-10 DIAGNOSIS — I5022 Chronic systolic (congestive) heart failure: Secondary | ICD-10-CM | POA: Diagnosis not present

## 2022-10-10 DIAGNOSIS — R627 Adult failure to thrive: Secondary | ICD-10-CM | POA: Diagnosis not present

## 2022-10-10 LAB — CBC WITH DIFFERENTIAL/PLATELET
Abs Immature Granulocytes: 0.34 10*3/uL — ABNORMAL HIGH (ref 0.00–0.07)
Basophils Absolute: 0.1 10*3/uL (ref 0.0–0.1)
Basophils Relative: 0 %
Eosinophils Absolute: 0 10*3/uL (ref 0.0–0.5)
Eosinophils Relative: 0 %
HCT: 37.6 % — ABNORMAL LOW (ref 39.0–52.0)
Hemoglobin: 12.6 g/dL — ABNORMAL LOW (ref 13.0–17.0)
Immature Granulocytes: 1 %
Lymphocytes Relative: 4 %
Lymphs Abs: 1 10*3/uL (ref 0.7–4.0)
MCH: 33.2 pg (ref 26.0–34.0)
MCHC: 33.5 g/dL (ref 30.0–36.0)
MCV: 98.9 fL (ref 80.0–100.0)
Monocytes Absolute: 0.5 10*3/uL (ref 0.1–1.0)
Monocytes Relative: 2 %
Neutro Abs: 24.5 10*3/uL — ABNORMAL HIGH (ref 1.7–7.7)
Neutrophils Relative %: 93 %
Platelets: 192 10*3/uL (ref 150–400)
RBC: 3.8 MIL/uL — ABNORMAL LOW (ref 4.22–5.81)
RDW: 16.9 % — ABNORMAL HIGH (ref 11.5–15.5)
WBC: 26.5 10*3/uL — ABNORMAL HIGH (ref 4.0–10.5)
nRBC: 0.3 % — ABNORMAL HIGH (ref 0.0–0.2)

## 2022-10-10 LAB — GLUCOSE, CAPILLARY
Glucose-Capillary: 131 mg/dL — ABNORMAL HIGH (ref 70–99)
Glucose-Capillary: 129 mg/dL — ABNORMAL HIGH (ref 70–99)
Glucose-Capillary: 191 mg/dL — ABNORMAL HIGH (ref 70–99)
Glucose-Capillary: 153 mg/dL — ABNORMAL HIGH (ref 70–99)
Glucose-Capillary: 171 mg/dL — ABNORMAL HIGH (ref 70–99)
Glucose-Capillary: 179 mg/dL — ABNORMAL HIGH (ref 70–99)

## 2022-10-10 LAB — COMPREHENSIVE METABOLIC PANEL
ALT: 22 U/L (ref 0–44)
AST: 53 U/L — ABNORMAL HIGH (ref 15–41)
Albumin: 2.2 g/dL — ABNORMAL LOW (ref 3.5–5.0)
Alkaline Phosphatase: 331 U/L — ABNORMAL HIGH (ref 38–126)
Anion gap: 10 (ref 5–15)
BUN: 18 mg/dL (ref 8–23)
CO2: 18 mmol/L — ABNORMAL LOW (ref 22–32)
Calcium: 8.1 mg/dL — ABNORMAL LOW (ref 8.9–10.3)
Chloride: 109 mmol/L (ref 98–111)
Creatinine, Ser: 0.85 mg/dL (ref 0.61–1.24)
GFR, Estimated: >60 mL/min (ref >60)
Glucose, Bld: 103 mg/dL — ABNORMAL HIGH (ref 70–99)
Potassium: 3.7 mmol/L (ref 3.5–5.1)
Sodium: 137 mmol/L (ref 135–145)
Total Bilirubin: 5.4 mg/dL — ABNORMAL HIGH (ref 0.3–1.2)
Total Protein: 5.4 g/dL — ABNORMAL LOW (ref 6.5–8.1)

## 2022-10-10 LAB — MAGNESIUM: Magnesium: 1.8 mg/dL (ref 1.7–2.4)

## 2022-10-10 LAB — PHOSPHORUS: Phosphorus: 2.7 mg/dL (ref 2.5–4.6)

## 2022-10-10 MED ORDER — ALUM & MAG HYDROXIDE-SIMETH 200-200-20 MG/5ML PO SUSP
15.0000 mL | Freq: Four times a day (QID) | ORAL | Status: DC | PRN
  Administered 2022-10-10 – 2022-10-12 (×4): 15 mL via ORAL
  Filled 2022-10-10 (×4): qty 30

## 2022-10-10 NOTE — Progress Notes (Signed)
PROGRESS NOTE    [name] [name]  VVO:[id number] DOB: 11-30-1955 DOA: 10/23/2022 PCP: Terrilyn Saver, NP   Brief Narrative:  [name] [name] is a 67 y.o. 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    Patient has a diagnosis of cholangiocarcinoma with a malignant biliary stricture status post stenting on 09/17/2022 treated with stent obstruction.  He is noted to have septic shock on admission and his WBC elevated to 38.7 on 10/01/2021.  He was given a dose of IV Unasyn and underwent repeat ERCP and stent replacement with findings of purulent discharge and cholangitis.  He was septic postprocedure with shock and PCCM was called for admission to the ICU.  He was admitted on 09/29/2021 and then had an ERCP on 10/01/2021 and transferred to the ICU with septic shock.  He is made DNR on 10/04/2021 after discussion with the family.  Repeat echocardiogram showed an EF of less than 20%.  He has swelling of her left upper extremity overnight and arterial duplex was ordered as well as a venous duplex.  Patient was subsequently weaned off of pressors from his septic shock.  The arterial duplex showed a left obstruction noted in the radial artery.  He was initiated on a heparin drip and the medical oncologist and vascular took him for an angiogram and he was found to have a high bifurcation of the radial artery with occlusion from the antecubital fossa to the wrist.  There is reconstitution of the radial artery via the interosseous artery and in the hand thrombosis of the digital arteries in the first second and third digits.  There is no flow in the digits and the palmar arch was intact.  Vascular surgery recommended hand surgery involvement and I called [name] who requested that  vascular surgery call him directly for consultation.  He was resumed on a heparin drip 6 hours after his procedure.   GI still following given his septic shock from Klebsiella.  They are recommending considering sigmoidoscopy at some point prior to discharge to evaluate the abnormal sigmoid wall thickening on the prior CT scan that was done without oral or IV contrast.  Given his improvement in his diarrhea and they are regular readdress it and reconsider flex sigmoidoscopy  due to some abnormal finding of the wall thickening in the sigmoid colon due to his other acute issues. Currently awaiting Hand Surgery evaluation and I spoke to the physician on-call today [name] who states he will be by in the morning given that the prior physician I was called and never came to evaluate the patient.   Patient was doing okay today and was on a heparin drip however started having some bright red clots with his bowel movements so his heparin drip was held and GI was reconsulted and the plan is to proceed with a flexible sigmoidoscopy with [name] in the morning  Sigomoidoscopy done and showed a frond-like/villous polypoid and ulcerated nonobstructing large mass found in the sigmoid colon which was circumferential and measured 4 cm in length with no bleeding present.  This was biopsied with cold forceps and a large 4 cm nonfunctioning circumferential mass noted at the sigmoid colon which was friable and some evidence of ulceration and multiple cold biopsies were obtained in the rectum and in the portion.  Patient likely has malignant tumor of the history of the colon which was biopsied.  Hand surgery evaluated and there is no current plans for surgical intervention at this point and likely will require amputation of multiple fingers but they are going away and allow the fingers to demarcate to ensure appropriate amputation level   Assessment and Plan:  Septic shock secondary to cholangitis, shock resolved and  sepsis has improved Biliary obstruction secondary to cholangiocarcinoma with Hyperbilirubinemia Klebsiella Bacteremia -Post ERCP with stent replacement -Shock is improving and he has been weaned off of pressors Recent Labs  Lab 10/05/22 0201 10/24/2022 0315 10/07/22 0355 10/08/22 0328 10/08/22 1216 10/25/2022 1127 10/10/22 0711  WBC 14.4* 12.2* 13.5* 14.5* 17.1* 12.8* 26.5*  -Patient's WBC worsened in the setting of Flex Sig -Bilirubin went from 6.6 -> 4.9 -> 4.7 -> 5.4 -C/w Ancef at least through 1/10 which will provide coverage through 5 days after source control -Continue to Monitor and Trend and Repeat CBC in the AM    Acute Respiratory Failure Secondary to Sepsis PT/OT, mobilize -SpO2: 96 % O2 Flow Rate (L/min): 2 L/min FiO2 (%): 40 % -Continuous pulse oximetry maintain O2 saturation greater 90% -Continue supplemental oxygen via nasal cannula wean O2 as tolerated -Will need an amatory home O2 screen prior to discharge as well as repeat chest x-ray   Chronic Heart Failure, nonischemic cardiomyopathy.   -EF less than 20%.  Reduction in cardiac function likely due to septic cardiomyopathy -Hold outpatient hypertension medications -C/w Telemetry -Hold diuretic in setting of pressor requirement, third spacing, currently weaned off of oxygen -Appears Euvolemic at this moment  -Patient is +7.940 Liters    Electrolyte abnormalities -Electrolyte Trend: Recent Labs  Lab 10/04/22 0404 10/05/22 0201 10/07/2022 0315 10/20/2022 1800 10/07/22 0355 10/08/22 0328 10/27/2022 1127 10/10/22 0711  NA 135 136 134*  --  136 132* 136 137  K 2.9* 3.1* 3.3*  --  3.2* 3.2* 3.8 3.7  MG  --  1.6* 1.9  --  1.9 1.9 1.8 1.8  PHOS  --   --  1.1*   < > 2.0* 2.0* 2.1* 2.7   < > = values in this interval not displayed.  -Replete Potassium, Na+ and Phos with Phos-Nak -Continue to monitor and replete as necessary -Repeat electrolytes in the a.m.   LUE Hand/Arm Swelling in the setting of Radial Artery  Occulsion -US DVT/arterial duplex done and showed a radial artery occlusion -Elevate, warm compresses -Vascular consulted and took the patient for angiogram -He was found to have a high bifurcation of the radial artery with occlusion from the antecubital fossa to the wrist.  There is reconstitution of the radial artery via the interosseous artery and in the hand thrombosis of the digital arteries in the first second and third digits.  There is no flow in the digits and the palmar arch was intact.  Vascular surgery recommended hand surgery involvement and I called [name] who requested that vascular surgery call him directly for consultation.  He was resumed on a heparin drip 6 hours after his procedure. -Hand surgery evaluated and feel that there is no surgical intervention at this time and recommending the hand to demarcate to see the appropriate amputation level as he will require some irritations of his fingers   Esophageal Candidiasis -C/w Fluconazole 1/8-   Thrombocytopenia -Possibly developing in setting acute UE DVT vs drug related vs sepsis.  -Hasn't had heparin exposure here until you start on a heparin drip this morning by  the oncologist -Platelet Count Trend: Recent Labs  Lab 10/05/22 0201 10/23/2022 0315 10/07/22 0355 10/08/22 0328 10/08/22 1216 10/21/2022 1127 10/10/22 0711  PLT 54* 62* 84* 117* 140* 143* 192  -Trend CBC    AKI on chronic kidney disease secondary to shock Metabolic Acidoiss -Contine to Monitor urine output and creatinine -BUN/Cr Trend: Recent Labs  Lab 10/04/22 0404 10/05/22 0201 10/07/2022 0315 10/07/22 0355 10/08/22 0328 09/30/2022 1127 10/10/22 0711  BUN 26* '21 17 14 12 13 18  '$ CREATININE 1.01 0.93 0.90 0.91 0.81 0.76 0.85  -Avoid Nephrotoxic Medications, Contrast Dyes, Hypotension and Dehydration to Ensure Adequate Renal Perfusion and will need to Renally Adjust Meds -Patient has a Slight Metabolic Acidosis with a  CO2 of 18, AG of 10, and  Chloride Level of 109 -Continue to Monitor and Trend Renal Function carefully and repeat CMP in the AM    Abnormal LFTs -LFT Trend: Recent Labs  Lab 10/03/22 0352 10/04/22 0404 10/05/22 0201 10/07/22 0355 10/08/22 0328 09/27/2022 1127 10/10/22 0711  AST 204* 128* 91* 53* 56* 55* 53*  ALT 110* 86* 54* '24 22 22 22  '$ -GI Following will continue to Monitor as LFTs are slowly declining   Likely ileus -Abdomen soft, +BS -Correct electrolyte abnormalities -Mobilize and he was given bowel regimen which is now stopped given the diarrhea which is now improving  Hypoalbuminemia -Patient's Albumin Trend: Recent Labs  Lab 10/03/22 0352 10/04/22 0404 10/05/22 0201 10/07/22 0355 10/08/22 0328 10/24/2022 1127 10/10/22 0711  ALBUMIN 1.8* 1.6* 1.6* 1.8* 1.8* 1.8* 2.2*  -Continue to Monitor and Trend and repeat CMP in the AM    Failure to thrive, severe protein calorie malnutrition present on admission -Advance to full diet Nutrition Status: Nutrition Problem: Severe Malnutrition Etiology: chronic illness (ampullary carcinoma with malignant biliary stricture) Signs/Symptoms: severe fat depletion, severe muscle depletion Interventions: Boost Breeze, Refer to RD note for recommendations, MVI   Diarrhea now with bloody bowel movements in the setting of likely malignant sigmoid colon tumor -Happening after his diagnosis and unclear of the CT scan findings were real artifact but GI still planning for a sigmoidoscopy prior to discharge to rule out any problems there -GI had signed the case given that his diarrhea has been improving but now given that he had a medium episode obstructed clot material and red blood earlier today the heparin drip was stopped and GI was reconsulted. -Repeat hemoglobin was stable -GI recommending a sigmoidoscopy and patient's daughters would like to proceed and plan is for a flexible sigmoidoscopy on 10/09/2021 with [name] -Flexible sigmoidoscopy done and GI feels  that he has a 4 cm nonobstructing circumferential mass that was friable and with some evidence of ulceration and feel that this is likely milligram tumor and this was biopsied. -Will need further care per oncology  DVT prophylaxis: SCDs Start: 10/22/2022 1928    Code Status: DNR Family Communication: Discussed with Friend at bedside   Disposition Plan:  Level of care: Stepdown Status is: Inpatient Remains inpatient appropriate because: Patient needs further evaluation by oncology and hand surgery.  Likely has a malignant sigmoid colon tumor now.  Needs further goals of care discussion   Consultants:  PCCM transfer Vascular surgery Gastroenterology Medical oncology Hand surgery Procedures:  As Delineated as Above  FLEX SIGMOIDOSCOPY Findings:      A frond-like/villous, polypoid and ulcerated non-obstructing large mass       was found in the sigmoid colon. The mass was circumferential. The mass  measured four cm in length. No bleeding was present. This was biopsied       with a cold forceps for histology.      A large 4 cm nonobstructing circumfirential mass was noted in the       sigmoid colon. The lesion was friable and there was some evidence of       ulceration. Multiple cold biopsies were obtained. In the rectum and in       portions of the descending colon, some ulcerations were noted. Impression:               - Likely malignant tumor in the sigmoid colon.                            Biopsied.  Antimicrobials:  Anti-infectives (From admission, onward)    Start     Dose/Rate Route Frequency Ordered Stop   10/04/22 0900  fluconazole (DIFLUCAN) IVPB 200 mg        200 mg 100 mL/hr over 60 Minutes Intravenous Every 24 hours 10/04/22 0719     10/03/22 1400  ceFAZolin (ANCEF) IVPB 2g/100 mL premix        2 g 200 mL/hr over 30 Minutes Intravenous Every 8 hours 10/03/22 1025 10/08/22 2359   10/08/2022 1800  vancomycin (VANCOCIN) IVPB 1000 mg/200 mL premix  Status:   Discontinued        1,000 mg 200 mL/hr over 60 Minutes Intravenous Every 24 hours 09/28/2022 1823 10/04/2022 0801   10/26/2022 1000  fluconazole (DIFLUCAN) tablet 100 mg  Status:  Discontinued        100 mg Oral Daily 10/08/2022 1450 10/07/2022 1505   10/12/2022 0100  ceFEPIme (MAXIPIME) 2 g in sodium chloride 0.9 % 100 mL IVPB  Status:  Discontinued        2 g 200 mL/hr over 30 Minutes Intravenous Every 12 hours 09/30/2022 1823 10/03/22 1025   10/08/2022 2200  metroNIDAZOLE (FLAGYL) IVPB 500 mg  Status:  Discontinued        500 mg 100 mL/hr over 60 Minutes Intravenous Every 12 hours 10/14/2022 1823 10/03/22 1025   10/08/2022 1730  vancomycin (VANCOREADY) IVPB 1500 mg/300 mL        1,500 mg 150 mL/hr over 120 Minutes Intravenous  Once 10/26/2022 1642 10/16/2022 1951   10/20/2022 1600  piperacillin-tazobactam (ZOSYN) IVPB 3.375 g  Status:  Discontinued        3.375 g 12.5 mL/hr over 240 Minutes Intravenous Every 8 hours 10/17/2022 1451 10/24/2022 1821   10/04/2022 1500  ampicillin-sulbactam (UNASYN) 1.5 g in sodium chloride 0.9 % 100 mL IVPB  Status:  Discontinued        1.5 g 200 mL/hr over 30 Minutes Intravenous Every 8 hours 09/30/2022 1449 10/10/2022 1451   10/04/2022 1500  fluconazole (DIFLUCAN) tablet 200 mg  Status:  Discontinued        200 mg Oral  Once 10/22/2022 1450 10/21/2022 1505   10/08/2022 1300  ampicillin-sulbactam (UNASYN) 1.5 g in sodium chloride 0.9 % 100 mL IVPB        1.5 g 200 mL/hr over 30 Minutes Intravenous To Endoscopy 10/05/2022 1244 10/19/2022 1616       Subjective: Seen and examined at bedside and the patient was doing okay and states that he did not sleep very well last night.  No nausea or vomiting.  States that the hand surgery came in last night.  Continues to feel  okay and no nausea.  Denies any further bloody bowel movements.  Objective: Vitals:   10/10/22 1404 10/10/22 1500 10/10/22 1600 10/10/22 1700  BP:  (!) 160/95 (!) 162/105 (!) 125/95  Pulse:  96 72 93  Resp:  (!) '21 19 16  '$ Temp: 98.8  F (37.1 C)     TempSrc: Oral     SpO2:  95% 94% 96%  Weight:      Height:        Intake/Output Summary (Last 24 hours) at 10/10/2022 1800 Last data filed at 10/10/2022 1600 Gross per 24 hour  Intake 480 ml  Output 850 ml  Net -370 ml   Filed Weights   10/08/22 0354 10/27/2022 1246 10/10/22 0500  Weight: 65.3 kg 65.3 kg 65.2 kg   Examination: Physical Exam:  Constitutional: Thin frail cachectic older appearing than his stated age African-American male in no acute distress Respiratory: Diminished to auscultation bilaterally, no wheezing, rales, rhonchi or crackles. Normal respiratory effort and patient is not tachypenic. No accessory muscle use.  Unlabored breathing Cardiovascular: Tachycardic rate but regular rhythm, no murmurs / rubs / gallops. S1 and S2 auscultated.  Trace extremity edema Abdomen: Soft, non-tender, non-distended. Bowel sounds positive.  GU: Deferred. Musculoskeletal: No clubbing / cyanosis of digits/nails. No joint deformity upper and lower extremities.  Skin: No rashes, lesions, ulcers on limited skin evaluation left hand is swollen and erythematous and fingertip nailbeds are black. No induration; Warm and dry.  Neurologic: CN 2-12 grossly intact with no focal deficits. Romberg sign and cerebellar reflexes not assessed.  Psychiatric: Normal judgment and insight. Alert and oriented x 3. Normal mood and appropriate affect.   Data Reviewed: I have personally reviewed following labs and imaging studies  CBC: Recent Labs  Lab 10/07/22 0355 10/08/22 0328 10/08/22 1216 09/28/2022 1127 10/10/22 0711  WBC 13.5* 14.5* 17.1* 12.8* 26.5*  NEUTROABS 11.1* 11.9* 14.5* 10.7* 24.5*  HGB 9.9* 9.5* 11.2* 9.8* 12.6*  HCT 29.6* 28.1* 33.6* 29.6* 37.6*  MCV 98.0 96.2 99.1 100.7* 98.9  PLT 84* 117* 140* 143* 300   Basic Metabolic Panel: Recent Labs  Lab 10/07/2022 0315 10/13/2022 1800 10/07/22 0355 10/08/22 0328 10/03/2022 1127 10/10/22 0711  NA 134*  --  136 132* 136 137   K 3.3*  --  3.2* 3.2* 3.8 3.7  CL 107  --  110 107 109 109  CO2 18*  --  19* 18* 20* 18*  GLUCOSE 153*  --  143* 124* 141* 103*  BUN 17  --  '14 12 13 18  '$ CREATININE 0.90  --  0.91 0.81 0.76 0.85  CALCIUM 7.4*  --  7.4* 7.3* 7.5* 8.1*  MG 1.9  --  1.9 1.9 1.8 1.8  PHOS 1.1* 2.4* 2.0* 2.0* 2.1* 2.7   GFR: Estimated Creatinine Clearance: 77.8 mL/min (by C-G formula based on SCr of 0.85 mg/dL). Liver Function Tests: Recent Labs  Lab 10/05/22 0201 10/07/22 0355 10/08/22 0328 10/14/2022 1127 10/10/22 0711  AST 91* 53* 56* 55* 53*  ALT 54* '24 22 22 22  '$ ALKPHOS 462* 394* 346* 285* 331*  BILITOT 6.6* 4.9* 4.7* 4.8* 5.4*  PROT 4.7* 4.6* 4.5* 4.1* 5.4*  ALBUMIN 1.6* 1.8* 1.8* 1.8* 2.2*   No results for input(s): "LIPASE", "AMYLASE" in the last 168 hours. No results for input(s): "AMMONIA" in the last 168 hours. Coagulation Profile: No results for input(s): "INR", "PROTIME" in the last 168 hours. Cardiac Enzymes: No results for input(s): "CKTOTAL", "CKMB", "CKMBINDEX", "TROPONINI" in the last  168 hours. BNP (last 3 results) Recent Labs    09/22/22 1141  PROBNP 835.0*   HbA1C: No results for input(s): "HGBA1C" in the last 72 hours. CBG: Recent Labs  Lab 10/21/2022 2317 10/10/22 0315 10/10/22 0747 10/10/22 1144 10/10/22 1644  GLUCAP 159* 131* 129* 191* 153*   Lipid Profile: No results for input(s): "CHOL", "HDL", "LDLCALC", "TRIG", "CHOLHDL", "LDLDIRECT" in the last 72 hours. Thyroid Function Tests: No results for input(s): "TSH", "T4TOTAL", "FREET4", "T3FREE", "THYROIDAB" in the last 72 hours. Anemia Panel: Recent Labs    10/08/22 0328  VITAMINB12 673   Sepsis Labs: No results for input(s): "PROCALCITON", "LATICACIDVEN" in the last 168 hours.  Recent Results (from the past 240 hour(s))  C Difficile Quick Screen w PCR reflex     Status: None   Collection Time: 10/10/2022  6:55 AM   Specimen: STOOL  Result Value Ref Range Status   C Diff antigen NEGATIVE NEGATIVE  Final   C Diff toxin NEGATIVE NEGATIVE Final   C Diff interpretation No C. difficile detected.  Final    Comment: Performed at [facility], [place] [address], [place], [place] [address]  Gastrointestinal Panel by PCR , Stool     Status: None   Collection Time: 10/10/2022  6:55 AM  Result Value Ref Range Status   Campylobacter species NOT DETECTED NOT DETECTED Final   Plesimonas shigelloides NOT DETECTED NOT DETECTED Final   Salmonella species NOT DETECTED NOT DETECTED Final   Yersinia enterocolitica NOT DETECTED NOT DETECTED Final   Vibrio species NOT DETECTED NOT DETECTED Final   Vibrio cholerae NOT DETECTED NOT DETECTED Final   Enteroaggregative E coli (EAEC) NOT DETECTED NOT DETECTED Final   Enteropathogenic E coli (EPEC) NOT DETECTED NOT DETECTED Final   Enterotoxigenic E coli (ETEC) NOT DETECTED NOT DETECTED Final   Shiga like toxin producing E coli (STEC) NOT DETECTED NOT DETECTED Final   Shigella/Enteroinvasive E coli (EIEC) NOT DETECTED NOT DETECTED Final   Cryptosporidium NOT DETECTED NOT DETECTED Final   Cyclospora cayetanensis NOT DETECTED NOT DETECTED Final   Entamoeba histolytica NOT DETECTED NOT DETECTED Final   Giardia lamblia NOT DETECTED NOT DETECTED Final   Adenovirus F40/41 NOT DETECTED NOT DETECTED Final   Astrovirus NOT DETECTED NOT DETECTED Final   Norovirus GI/GII NOT DETECTED NOT DETECTED Final   Rotavirus A NOT DETECTED NOT DETECTED Final   Sapovirus (I, II, IV, and V) NOT DETECTED NOT DETECTED Final    Comment: Performed at [facility], [address] [place], [place] [address]  Culture, blood (Routine X 2) w Reflex to ID Panel     Status: Abnormal   Collection Time: 10/14/2022  8:41 AM   Specimen: BLOOD LEFT HAND  Result Value Ref Range Status   Specimen Description   Final    BLOOD LEFT HAND Performed at [facility], [place] [address] [place], [place] [address]    Special Requests   Final    BOTTLES DRAWN  AEROBIC ONLY Blood Culture adequate volume Performed at [facility] [address] [place], [place] [address]    Culture  Setup Time   Final    GRAM NEGATIVE RODS AEROBIC BOTTLE ONLY Organism ID to follow CRITICAL RESULT CALLED TO, READ BACK BY AND VERIFIED WITH: M LILLISTON,PHARMD'@0012'$  10/11/2022 [place] Performed at [facility], [place] [address], [place], [place] [address]    Culture KLEBSIELLA PNEUMONIAE (A)  Final   Report Status 10/03/2022 FINAL  Final  Organism ID, Bacteria KLEBSIELLA PNEUMONIAE  Final      Susceptibility   Klebsiella pneumoniae - MIC*    AMPICILLIN RESISTANT Resistant     CEFAZOLIN <=4 SENSITIVE Sensitive     CEFEPIME <=0.12 SENSITIVE Sensitive     CEFTAZIDIME <=1 SENSITIVE Sensitive     CEFTRIAXONE <=0.25 SENSITIVE Sensitive     CIPROFLOXACIN <=0.25 SENSITIVE Sensitive     GENTAMICIN <=1 SENSITIVE Sensitive     IMIPENEM <=0.25 SENSITIVE Sensitive     TRIMETH/SULFA <=20 SENSITIVE Sensitive     AMPICILLIN/SULBACTAM <=2 SENSITIVE Sensitive     PIP/TAZO <=4 SENSITIVE Sensitive     * KLEBSIELLA PNEUMONIAE  Culture, blood (Routine X 2) w Reflex to ID Panel     Status: Abnormal   Collection Time: 10/17/2022  8:41 AM   Specimen: BLOOD RIGHT ARM  Result Value Ref Range Status   Specimen Description   Final    BLOOD RIGHT ARM Performed at [facility] [address], [place], [place] [address]    Special Requests   Final    BOTTLES DRAWN AEROBIC ONLY Blood Culture adequate volume Performed at [facility] [address] [place], [place] [address]    Culture  Setup Time   Final    GRAM NEGATIVE RODS BOTTLES DRAWN AEROBIC ONLY CRITICAL VALUE NOTED.  VALUE IS CONSISTENT WITH PREVIOUSLY REPORTED AND CALLED VALUE.    Culture (A)  Final    KLEBSIELLA PNEUMONIAE SUSCEPTIBILITIES PERFORMED ON PREVIOUS CULTURE WITHIN THE LAST 5 DAYS. Performed at [facility], [place] [address] [place], [place]  [address]    Report Status 10/03/2022 FINAL  Final  Blood Culture ID Panel (Reflexed)     Status: Abnormal   Collection Time: 10/14/2022  8:41 AM  Result Value Ref Range Status   Enterococcus faecalis NOT DETECTED NOT DETECTED Final   Enterococcus Faecium NOT DETECTED NOT DETECTED Final   Listeria monocytogenes NOT DETECTED NOT DETECTED Final   Staphylococcus species NOT DETECTED NOT DETECTED Final   Staphylococcus aureus (BCID) NOT DETECTED NOT DETECTED Final   Staphylococcus epidermidis NOT DETECTED NOT DETECTED Final   Staphylococcus lugdunensis NOT DETECTED NOT DETECTED Final   Streptococcus species NOT DETECTED NOT DETECTED Final   Streptococcus agalactiae NOT DETECTED NOT DETECTED Final   Streptococcus pneumoniae NOT DETECTED NOT DETECTED Final   Streptococcus pyogenes NOT DETECTED NOT DETECTED Final   A.calcoaceticus-baumannii NOT DETECTED NOT DETECTED Final   Bacteroides fragilis NOT DETECTED NOT DETECTED Final   Enterobacterales DETECTED (A) NOT DETECTED Final    Comment: Enterobacterales represent a large order of gram negative bacteria, not a single organism. CRITICAL RESULT CALLED TO, READ BACK BY AND VERIFIED WITH: M LILLISTON,PHARMD'@0012'$  09/27/2022 [place]    Enterobacter cloacae complex NOT DETECTED NOT DETECTED Final   Escherichia coli NOT DETECTED NOT DETECTED Final   Klebsiella aerogenes NOT DETECTED NOT DETECTED Final   Klebsiella oxytoca NOT DETECTED NOT DETECTED Final   Klebsiella pneumoniae DETECTED (A) NOT DETECTED Final    Comment: CRITICAL RESULT CALLED TO, READ BACK BY AND VERIFIED WITH: M LILLISTON,PHARMD'@0012'$  10/22/2022 [place]    Proteus species NOT DETECTED NOT DETECTED Final   Salmonella species NOT DETECTED NOT DETECTED Final   Serratia marcescens NOT DETECTED NOT DETECTED Final   Haemophilus influenzae NOT DETECTED NOT DETECTED Final   Neisseria meningitidis NOT DETECTED NOT DETECTED Final   Pseudomonas aeruginosa NOT DETECTED NOT DETECTED Final   Stenotrophomonas  maltophilia NOT DETECTED NOT DETECTED Final   Candida  albicans NOT DETECTED NOT DETECTED Final   Candida auris NOT DETECTED NOT DETECTED Final   Candida glabrata NOT DETECTED NOT DETECTED Final   Candida krusei NOT DETECTED NOT DETECTED Final   Candida parapsilosis NOT DETECTED NOT DETECTED Final   Candida tropicalis NOT DETECTED NOT DETECTED Final   Cryptococcus neoformans/gattii NOT DETECTED NOT DETECTED Final   CTX-M ESBL NOT DETECTED NOT DETECTED Final   Carbapenem resistance IMP NOT DETECTED NOT DETECTED Final   Carbapenem resistance KPC NOT DETECTED NOT DETECTED Final   Carbapenem resistance NDM NOT DETECTED NOT DETECTED Final   Carbapenem resist OXA 48 LIKE NOT DETECTED NOT DETECTED Final   Carbapenem resistance VIM NOT DETECTED NOT DETECTED Final    Comment: Performed at [facility], [address], [place] [place], [place] [address]  MRSA Next Gen by PCR, Nasal     Status: None   Collection Time: 09/28/2022  3:59 PM   Specimen: Nasal Mucosa; Nasal Swab  Result Value Ref Range Status   MRSA by PCR Next Gen NOT DETECTED NOT DETECTED Final    Comment: (NOTE) The GeneXpert MRSA Assay (FDA approved for NASAL specimens only), is one component of a comprehensive MRSA colonization surveillance program. It is not intended to diagnose MRSA infection nor to guide or monitor treatment for MRSA infections. Test performance is not FDA approved in patients less than 64 years old. Performed at [facility], [place] [address] [place], [place] [address]   Urine Culture     Status: None   Collection Time: 10/27/2022  4:50 PM   Specimen: Urine, Catheterized  Result Value Ref Range Status   Specimen Description   Final    URINE, CATHETERIZED Performed at [place] [address], [place], [place] [address]    Special Requests   Final    NONE Performed at [facility], [place] [address] [place], [place] [address]    Culture   Final     NO GROWTH Performed at [facility], [place] [address] [place], [place] [address]    Report Status 10/03/2022 FINAL  Final    Radiology Studies: No results found.  Scheduled Meds:  Chlorhexidine Gluconate Cloth  6 each Topical Daily   digoxin  0.125 mg Oral Daily   feeding supplement  1 Container Oral TID BM   hydrocortisone sod succinate (SOLU-CORTEF) inj  100 mg Intravenous Q8H   insulin aspart  0-9 Units Subcutaneous Q4H   multivitamin with minerals  1 tablet Oral Daily   sodium chloride flush  3 mL Intravenous Q12H   Continuous Infusions:  sodium chloride Stopped (10/08/22 1055)   fluconazole (DIFLUCAN) IV Stopped (10/10/22 1106)    LOS: 11 days   Raiford Noble, DO [facility] Available via Epic secure chat 7am-7pm After these hours, please refer to coverage provider listed on [email or web address] 10/10/2022, 6:00 PM

## 2022-10-11 ENCOUNTER — Encounter: Payer: Self-pay | Admitting: *Deleted

## 2022-10-11 ENCOUNTER — Encounter (HOSPITAL_COMMUNITY): Payer: Self-pay | Admitting: Gastroenterology

## 2022-10-11 DIAGNOSIS — C241 Malignant neoplasm of ampulla of Vater: Secondary | ICD-10-CM | POA: Diagnosis not present

## 2022-10-11 DIAGNOSIS — K8309 Other cholangitis: Secondary | ICD-10-CM | POA: Diagnosis not present

## 2022-10-11 DIAGNOSIS — K6389 Other specified diseases of intestine: Secondary | ICD-10-CM | POA: Diagnosis not present

## 2022-10-11 LAB — CBC WITH DIFFERENTIAL/PLATELET
Abs Immature Granulocytes: 0.16 10*3/uL — ABNORMAL HIGH (ref 0.00–0.07)
Basophils Absolute: 0 10*3/uL (ref 0.0–0.1)
Basophils Relative: 0 %
Eosinophils Absolute: 0 10*3/uL (ref 0.0–0.5)
Eosinophils Relative: 0 %
HCT: 33.5 % — ABNORMAL LOW (ref 39.0–52.0)
Hemoglobin: 10.7 g/dL — ABNORMAL LOW (ref 13.0–17.0)
Immature Granulocytes: 1 %
Lymphocytes Relative: 4 %
Lymphs Abs: 0.9 10*3/uL (ref 0.7–4.0)
MCH: 33 pg (ref 26.0–34.0)
MCHC: 31.9 g/dL (ref 30.0–36.0)
MCV: 103.4 fL — ABNORMAL HIGH (ref 80.0–100.0)
Monocytes Absolute: 0.4 10*3/uL (ref 0.1–1.0)
Monocytes Relative: 2 %
Neutro Abs: 21 10*3/uL — ABNORMAL HIGH (ref 1.7–7.7)
Neutrophils Relative %: 93 %
Platelets: 141 10*3/uL — ABNORMAL LOW (ref 150–400)
RBC: 3.24 MIL/uL — ABNORMAL LOW (ref 4.22–5.81)
RDW: 16.4 % — ABNORMAL HIGH (ref 11.5–15.5)
WBC: 22.5 10*3/uL — ABNORMAL HIGH (ref 4.0–10.5)
nRBC: 0.1 % (ref 0.0–0.2)

## 2022-10-11 LAB — COMPREHENSIVE METABOLIC PANEL
ALT: 16 U/L (ref 0–44)
AST: 34 U/L (ref 15–41)
Albumin: 1.8 g/dL — ABNORMAL LOW (ref 3.5–5.0)
Alkaline Phosphatase: 247 U/L — ABNORMAL HIGH (ref 38–126)
Anion gap: 8 (ref 5–15)
BUN: 19 mg/dL (ref 8–23)
CO2: 19 mmol/L — ABNORMAL LOW (ref 22–32)
Calcium: 7.3 mg/dL — ABNORMAL LOW (ref 8.9–10.3)
Chloride: 110 mmol/L (ref 98–111)
Creatinine, Ser: 0.89 mg/dL (ref 0.61–1.24)
GFR, Estimated: >60 mL/min (ref >60)
Glucose, Bld: 213 mg/dL — ABNORMAL HIGH (ref 70–99)
Potassium: 3.1 mmol/L — ABNORMAL LOW (ref 3.5–5.1)
Sodium: 137 mmol/L (ref 135–145)
Total Bilirubin: 4 mg/dL — ABNORMAL HIGH (ref 0.3–1.2)
Total Protein: 4.6 g/dL — ABNORMAL LOW (ref 6.5–8.1)

## 2022-10-11 LAB — GLUCOSE, CAPILLARY
Glucose-Capillary: 204 mg/dL — ABNORMAL HIGH (ref 70–99)
Glucose-Capillary: 171 mg/dL — ABNORMAL HIGH (ref 70–99)
Glucose-Capillary: 161 mg/dL — ABNORMAL HIGH (ref 70–99)
Glucose-Capillary: 157 mg/dL — ABNORMAL HIGH (ref 70–99)
Glucose-Capillary: 122 mg/dL — ABNORMAL HIGH (ref 70–99)
Glucose-Capillary: 110 mg/dL — ABNORMAL HIGH (ref 70–99)

## 2022-10-11 LAB — PHOSPHORUS: Phosphorus: 2.7 mg/dL (ref 2.5–4.6)

## 2022-10-11 LAB — MAGNESIUM: Magnesium: 1.7 mg/dL (ref 1.7–2.4)

## 2022-10-11 MED ORDER — POTASSIUM CHLORIDE CRYS ER 20 MEQ PO TBCR
40.0000 meq | EXTENDED_RELEASE_TABLET | Freq: Two times a day (BID) | ORAL | Status: AC
  Administered 2022-10-11 (×2): 40 meq via ORAL
  Filled 2022-10-11 (×2): qty 2

## 2022-10-11 MED ORDER — MAGNESIUM SULFATE 2 GM/50ML IV SOLN
2.0000 g | Freq: Once | INTRAVENOUS | Status: AC
  Administered 2022-10-11: 2 g via INTRAVENOUS
  Filled 2022-10-11: qty 50

## 2022-10-11 NOTE — Evaluation (Signed)
Occupational Therapy Evaluation Patient Details Name: [name] [name] MRN: [id number] DOB: 1956-03-17 Today's Date: 10/11/2022   History of Present Illness Patient is a 67 year old male who was admitted on 10/24/2022 with failure to thrive, septic shock,and persistent hyperbilirubinemia. Patient underwent ERCP and transitioned to ICU. Patient underwent angiogram 1/10 with high bifurcation of radial artery with occlusion from antecubital fossa in wrist. Ortho is following. Sigmoidoscopy on 1/13.   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 New radial artery occlusion with hand ischemia   Clinical Impression   Patient is a 67 year old male who was admitted for above. Patient was living at home with family support prior level. Currenrtly, patient used STEDy to get to [facility] and back to bed with HR noted to reach 127 bpm during session. Patient was educated on elevation of LUE with pillow positioning provided. Patient declined to have more than one pillow reporting it was too uncomfortable. Patient was noted to have decreased functional activity tolernace, decreased ROM, decreased use of LUE, decreased endurance, decreased sitting balance, decreased standing balanced, decreased safety awareness, and decreased knowledge of AE/AD impacting participation in ADLs. Patient would continue to benefit from skilled OT services at this time while admitted and after d/c to address noted deficits in order to improve overall safety and independence in ADLs.        Recommendations for follow up therapy are one component of a multi-disciplinary discharge planning process, led by the attending physician.  Recommendations may be updated based on patient  status, additional functional criteria and insurance authorization.   Follow Up Recommendations  Skilled nursing-short term rehab (<3 hours/day)     Assistance Recommended at Discharge Frequent or constant Supervision/Assistance  Patient can return home with the following Assistance with cooking/housework;Direct supervision/assist for medications management;Assist for transportation;Direct supervision/assist for financial management;Help with stairs or ramp for entrance;Two people to help with walking and/or transfers;A lot of help with bathing/dressing/bathroom    Functional Status Assessment  Patient has had a recent decline in their functional status and demonstrates the ability to make significant improvements in function in a reasonable and predictable amount of time.  Equipment Recommendations  None recommended by OT    Recommendations for Other Services       Precautions / Restrictions Precautions Precautions: Fall (Simultaneous filing. User may not have seen previous data.) Precaution Comments: poor circulation in L hand pending digit amputations (Simultaneous filing. User may not have seen previous data.) Restrictions Weight Bearing Restrictions: No      Mobility Bed Mobility Overal bed mobility: Needs Assistance     Sidelying to sit: Mod assist       General bed mobility comments: with increased time and cues for proper positioning       Balance Overall balance assessment: Needs assistance Sitting-balance support: Feet supported, Bilateral upper extremity supported Sitting balance-Leahy Scale: Fair     Standing balance support: During functional activity, Bilateral upper extremity supported Standing balance-Leahy Scale: Poor         ADL either performed or assessed with clinical judgement   ADL Overall ADL's : Needs assistance/impaired Eating/Feeding: Sitting;Minimal assistance   Grooming: Wash/dry face;Wash/dry hands;Supervision/safety;Sitting    Upper Body Bathing: Minimal assistance;Sitting   Lower Body Bathing: Total assistance;Sit to/from stand   Upper Body Dressing : Moderate  assistance;Sitting   Lower Body Dressing: Sit to/from stand;Total assistance   Toilet Transfer: +2 for physical assistance;+2 for safety/equipment Toilet Transfer Details (indicate cue type and reason): STEDY used to transfer from edge of bed to 3 in1 commode and then back to bed per patient request. patient not agreeable to participate in time out of bed today. nurse made aware. Toileting- Water quality scientist and Hygiene: Sit to/from stand;Total assistance               Vision Patient Visual Report: No change from baseline              Pertinent Vitals/Pain Pain Assessment Pain Assessment: Faces Faces Pain Scale: Hurts even more Pain Location: abdomen with touch, L hand/wrist with increased edema. Pain Descriptors / Indicators: Grimacing, Discomfort Pain Intervention(s): Limited activity within patient's tolerance, Monitored during session     Hand Dominance Right   Extremity/Trunk Assessment Upper Extremity Assessment Upper Extremity Assessment: LUE deficits/detail LUE Deficits / Details: patient has edema in UE with increased darkness in L hand with shriveling of digits of L hand especially in thumb, index and middle digits. pending amputation per ortho notes. patient declined to attempt to demonstrate ROM on LUE and looked towards RUE to complete instead. LUE: Unable to fully assess due to pain   Lower Extremity Assessment Lower Extremity Assessment: Defer to PT evaluation   Cervical / Trunk Assessment Cervical / Trunk Assessment: Normal;Other exceptions Cervical / Trunk Exceptions: peripheral edema in trunk and hips.   Communication Communication Communication: No difficulties   Cognition Arousal/Alertness: Awake/alert Behavior During Therapy: WFL for tasks assessed/performed Overall Cognitive Status: No family/caregiver  present to determine baseline cognitive functioning         Following Commands: Follows one step commands consistently       General Comments: patient participated in tasks but not agreeable to education provided during session.     General Comments       Exercises     Shoulder Instructions      Home Living Family/patient expects to be discharged to:: Private residence Living Arrangements: Alone Available Help at Discharge: Family;Available PRN/intermittently ("most of the time") Type of Home: House Home Access: Stairs to enter [employer] of Steps: 2   Home Layout: One level     Bathroom Shower/Tub: Teacher, early years/pre: Standard     Home Equipment: Cane - single point   Additional Comments: patient reported he could transition to daughters if needed at time of d/c.      Prior Functioning/Environment Prior Level of Function : Independent/Modified Independent             Mobility Comments: patient uses cane in community.          OT Problem List: Impaired balance (sitting and/or standing);Decreased safety awareness;Decreased knowledge of use of DME or AE;Decreased knowledge of precautions      OT Treatment/Interventions: Self-care/ADL training;Energy conservation;Therapeutic exercise;DME and/or AE instruction;Therapeutic activities;Patient/family education;Balance training    OT Goals(Current goals can be found in the care plan section) Acute Rehab OT Goals Patient Stated Goal: to get back in bed OT Goal Formulation: With patient Time For Goal Achievement: 10/25/22 Potential to Achieve Goals: Fair  OT Frequency: Min 2X/week    Co-evaluation   Reason for Co-Treatment: Complexity of the patient's impairments (multi-system involvement);For patient/therapist safety PT goals addressed during session: Mobility/safety with mobility OT goals addressed during session: ADL's and self-care      AM-PAC OT "6 Clicks" Daily Activity  Outcome Measure Help from another person eating meals?: A Little Help from another person taking care of personal grooming?: A Little Help from another person toileting, which includes using toliet, bedpan, or urinal?: A Lot Help from another person bathing (including washing, rinsing, drying)?: A Lot Help from another person to put on and taking off regular upper body clothing?: A Lot Help from another person to put on and taking off regular lower body clothing?: A Lot 6 Click Score: 14   End of Session Equipment Utilized During Treatment: Other (comment) (STEDY) Nurse Communication: Other (comment) (ok to participate in session)  Activity Tolerance: Patient tolerated treatment well Patient left: in bed;with call bell/phone within reach;Other (comment) (NT in room)  OT Visit Diagnosis: Unsteadiness on feet (R26.81);Other abnormalities of gait and mobility (R26.89)                Time: 1000-1036 OT Time Calculation (min): 36 min Charges:  OT General Charges $OT Visit: 1 Visit OT Evaluation $OT Eval Moderate Complexity: 1 Mod  Luanne Krzyzanowski OTR/L, MS Acute Rehabilitation Department Office# [phone]   Willa Rough 10/11/2022, 12:58 PM

## 2022-10-11 NOTE — Progress Notes (Signed)
Patient continues to be hospitalized. He is still too weak for treatment with multiple medical issues and a low pre-albumin.   Will continue to follow for post discharge needs and office follow up.  Oncology Nurse Navigator Documentation     10/11/2022    9:45 AM  Oncology Nurse Navigator Flowsheets  Navigator Follow Up Date: 10/19/2022  Navigator Follow Up Reason: Appointment Review  Navigator Location [facility]  Navigator Encounter Type Appt/Treatment Plan Review  Patient Visit Type MedOnc  Treatment Phase Pre-Tx/Tx Discussion  Barriers/Navigation Needs Coordination of Care;Education  Interventions None Required  Acuity Level 2-Minimal Needs (1-2 Barriers Identified)  Support Groups/Services Friends and Family  Time Spent with Patient 15

## 2022-10-11 NOTE — Plan of Care (Signed)

## 2022-10-11 NOTE — TOC Progression Note (Signed)
Transition of Care [facility]) - Progression Note    Patient Details  Name: [name] [name] MRN: [id number] Date of Birth: 1955/10/10  Transition of Care [facility]) CM/SW Contact  Leeroy Cha, RN Phone Number: 10/11/2022, 8:28 AM  Clinical Narrative:    Bed offers  Destination  Service Provider Request Status Selected Services Address Phone Fax Patient Preferred  [facility] Preferred SNF  Accepted N/A [address] [place] [address] [phone] --  [facility] Preferred SNF  Accepted N/A [address], [place] [address] [phone] --  [facility]  Accepted N/A [address], [place] [address] [phone] --     Expected Discharge Plan: Skilled Nursing Facility Barriers to Discharge: Continued Medical Work up  Expected Discharge Plan and Services   Discharge Planning Services: CM Consult   Living arrangements for the past 2 months: Apartment                                       Social Determinants of Health (SDOH) Interventions [name]: Food Insecurity Present (09/30/2022)  Housing: Low Risk  (09/30/2022)  Transportation Needs: No Transportation Needs (09/30/2022)  Utilities: Not At Risk (09/30/2022)  Depression (PHQ2-9): Low Risk  (09/22/2022)  Tobacco Use: High Risk (10/18/2022)    Readmission Risk Interventions   Row Labels 10/26/2022    8:27 AM  Readmission Risk Prevention Plan   Section Header. No data exists in this row.   Transportation Screening   Complete  PCP or Specialist Appt within 5-7 Days   Complete  Home Care Screening   Complete  Medication Review (RN CM)   Complete

## 2022-10-11 NOTE — Progress Notes (Addendum)
Daily Progress Note  Hospital Day: 53  Chief Complaint: ampullary cancer with biliary obstruction   Assessment   Patient profile:  [name] [name] is a 67 y.o. male with a pmh of nonischemic cardiomyopathy status post ICD placement, chronic systolic heart failure with an EF of 25%, hypertension and recently diagnosed with ampullary carcinoma with biliary obstruction   # Ampullary carcinoma s/p ercp with plastic stent placement on 12/22. Admitted 1/4 with diarrhea but also rising bilirubin and occluded stent. Underwent ERCP with placement of uncovered metal biliary stent on 10/21/2022. Found to have cholangitis and post procedure went to ICU for septic shock.   # Septic shock due to cholangitis.  Antibiotics complete. WBC is still elevated at 22K but on IV steroids .  LFTs improving  # AKI on CKD. Resolved. GFR > 60  # Sigmoid mass.  Diarrhea present for weeks but got better this admission. However, due to sigmoid lesion seen on prior CT scan and also rectal bleeding on heparin ( ? Radial artery occlusion of hand) a flexible sigmoidoscopy was done yesterday and showed a 4 cm non-obstructing circumferential , friable mass in sigmoid suspected to be malignant  # Candida esophagitis, on Diflucan  # Severe cardiomyopathy   Plan:    On day # of Diflucan, needs 14 day course  Await sigmoid mass biopsies --------------------------------------------------------------------------------------------------------------  Gastroenterology attending:  Agree with [name]'s assessment and plan.  The patient is significantly better though still quite ill.  I have seen and evaluated the patient in person as well.  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  We will follow-up on the biopsies though our management input is limited at this time.    [name], MD, [facility] See [name] on call - gastroenterology for best  contact person 10/11/2022 5:39 PM  Subjective   No complaints. He is quiet today.   Objective   Lab Results: Recent Labs    09/30/2022 1127 10/10/22 0711 10/11/22 0304  WBC 12.8* 26.5* 22.5*  HGB 9.8* 12.6* 10.7*  HCT 29.6* 37.6* 33.5*  PLT 143* 192 141*   BMET Recent Labs    10/13/2022 1127 10/10/22 0711 10/11/22 0304  NA 136 137 137  K 3.8 3.7 3.1*  CL 109 109 110  CO2 20* 18* 19*  GLUCOSE 141* 103* 213*  BUN '13 18 19  '$ CREATININE 0.76 0.85 0.89  CALCIUM 7.5* 8.1* 7.3*   LFT Recent Labs    10/11/22 0304  PROT 4.6*  ALBUMIN 1.8*  AST 34  ALT 16  ALKPHOS 247*  BILITOT 4.0*   PT/INR No results for input(s): "LABPROT", "INR" in the last 72 hours.   Scheduled inpatient medications:   Chlorhexidine Gluconate Cloth  6 each Topical Daily   digoxin  0.125 mg Oral Daily   feeding supplement  1 Container Oral TID BM   hydrocortisone sod succinate (SOLU-CORTEF) inj  100 mg Intravenous Q8H   insulin aspart  0-9 Units Subcutaneous Q4H   multivitamin with minerals  1 tablet Oral Daily   potassium chloride  40 mEq Oral BID   sodium chloride flush  3 mL Intravenous Q12H   Continuous inpatient infusions:   sodium chloride Stopped (10/08/22 1055)   fluconazole (DIFLUCAN) IV Stopped (10/11/22 1055)   magnesium sulfate bolus IVPB 2 g (10/11/22 1112)   PRN inpatient medications: sodium chloride, acetaminophen, alum & mag hydroxide-simeth, guaiFENesin-dextromethorphan, hydrALAZINE, HYDROmorphone (DILAUDID) injection, labetalol, ondansetron (ZOFRAN)  IV, mouth rinse  Vital signs in last 24 hours: Temp:  [97.3 F (36.3 C)-98.8 F (37.1 C)] 97.8 F (36.6 C) (01/15 0800) Pulse Rate:  [61-96] 75 (01/15 1000) Resp:  [11-22] 11 (01/15 1000) BP: (105-162)/(61-118) 148/85 (01/15 1000) SpO2:  [93 %-98 %] 93 % (01/15 1000) Weight:  [67.3 kg] 67.3 kg (01/15 0314) Last BM Date : 10/08/2022  Intake/Output Summary (Last 24 hours) at 10/11/2022 1204 Last data filed at 10/11/2022  1058 Gross per 24 hour  Intake 300.01 ml  Output 500 ml  Net -199.99 ml    Intake/Output from previous day: 01/14 0701 - 01/15 0700 In: 480 [P.O.:480] Out: 750 [Urine:750] Intake/Output this shift: Total I/O In: 300 [IV Piggyback:300] Out: -    Physical Exam:  General: Alert thin male in NAD Heart:  Regular rate, irreg rhythm.  Pulmonary: Normal respiratory effort Abdomen: Soft, moderately distended with hypoactive bowel sounds.Nontender Neurologic: Alert and oriented Psych: Pleasant. Cooperative.    Principal Problem:   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[place]) Active Problems:   Hematochezia   Chronic systolic CHF (congestive heart failure) (HCC)   Nonischemic cardiomyopathy (HCC)   Hypokalemia   ICD (implantable cardioverter-defibrillator) in place   Biliary obstruction due to malignant neoplasm (HCC)   Abnormal CT scan, colon   Secondary malignancy of biliary tract (HCC)   Elevated CEA   Abnormal LFTs   Severe sepsis with septic shock (HCC)   Palliative care encounter   Failure to thrive in adult   Severe protein-calorie malnutrition (HCC)   HFrEF (heart failure with reduced ejection fraction) ([place])   Goals of care, counseling/discussion   Counseling and coordination of care   Common bile duct (CBD) stricture   Chronic diarrhea   Esophageal candidiasis ([place])   Cholangiocarcinoma ([place])     LOS: 12 days   Tye Savoy ,NP 10/11/2022, 12:04 PM

## 2022-10-11 NOTE — Progress Notes (Signed)
PROGRESS NOTE    [name] [name]  WFU:[id number] DOB: 1955-12-25 DOA: 10/07/2022 PCP: Terrilyn Saver, NP   Brief Narrative:  [name] [name] is a 67 y.o. 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    Patient has a diagnosis of cholangiocarcinoma with a malignant biliary stricture status post stenting on 09/17/2022 treated with stent obstruction.  He is noted to have septic shock on admission and his WBC elevated to 38.7 on 10/01/2021.  He was given a dose of IV Unasyn and underwent repeat ERCP and stent replacement with findings of purulent discharge and cholangitis.  He was septic postprocedure with shock and PCCM was called for admission to the ICU.  He was admitted on 09/29/2021 and then had an ERCP on 10/01/2021 and transferred to the ICU with septic shock.  He is made DNR on 10/04/2021 after discussion with the family.  Repeat echocardiogram showed an EF of less than 20%.  He has swelling of her left upper extremity overnight and arterial duplex was ordered as well as a venous duplex.  Patient was subsequently weaned off of pressors from his septic shock.  The arterial duplex showed a left obstruction noted in the radial artery.  He was initiated on a heparin drip and the medical oncologist and vascular took him for an angiogram and he was found to have a high bifurcation of the radial artery with occlusion from the antecubital fossa to the wrist.  There is reconstitution of the radial artery via the interosseous artery and in the hand thrombosis of the digital arteries in the first second and third digits.  There is no flow in the digits and the palmar arch was intact.  Vascular surgery recommended hand surgery involvement and I called [name] who requested that  vascular surgery call him directly for consultation.  He was resumed on a heparin drip 6 hours after his procedure.   GI still following given his septic shock from Klebsiella.  They are recommending considering sigmoidoscopy at some point prior to discharge to evaluate the abnormal sigmoid wall thickening on the prior CT scan that was done without oral or IV contrast.  Given his improvement in his diarrhea and they are regular readdress it and reconsider flex sigmoidoscopy  due to some abnormal finding of the wall thickening in the sigmoid colon due to his other acute issues. Currently awaiting Hand Surgery evaluation and I spoke to the physician on-call today [name] who states he will be by in the morning given that the prior physician I was called and never came to evaluate the patient.   Patient was doing okay today and was on a heparin drip however started having some bright red clots with his bowel movements so his heparin drip was held and GI was reconsulted and the plan is to proceed with a flexible sigmoidoscopy with [name] in the morning   Sigomoidoscopy done and showed a frond-like/villous polypoid and ulcerated nonobstructing large mass found in the sigmoid colon which was circumferential and measured 4 cm in length with no bleeding present.  This was biopsied with cold forceps and a large 4 cm nonfunctioning circumferential mass noted at the sigmoid colon which was friable and some evidence of ulceration and multiple cold biopsies were obtained in the rectum and in the portion.  Patient likely has malignant tumor of the history of the colon which was biopsied.  Hand surgery evaluated and there is no current plans for surgical intervention at this point and likely will require amputation of multiple fingers but they are going away and allow the fingers to demarcate to ensure appropriate amputation level  Assessment and Plan:  Septic shock secondary to cholangitis, shock resolved and  sepsis has improved Biliary obstruction secondary to cholangiocarcinoma with Hyperbilirubinemia Klebsiella Bacteremia -Post ERCP with stent replacement -Shock is improving and he has been weaned off of pressors Recent Labs  Lab 10/26/2022 0315 10/07/22 0355 10/08/22 0328 10/08/22 1216 10/14/2022 1127 10/10/22 0711 10/11/22 0304  WBC 12.2* 13.5* 14.5* 17.1* 12.8* 26.5* 22.5*  -Patient's WBC worsened in the setting of Flex Sig -Bilirubin went from 6.6 -> 4.9 -> 4.7 -> 5.4 -> 4.0 -C/w Ancef at least through 1/10 which will provide coverage through 5 days after source control -Continue to Monitor and Trend and Repeat CBC in the AM  -PT/OT Recommending SNF   Acute Respiratory Failure Secondary to Sepsis PT/OT, mobilize -SpO2: 93 % O2 Flow Rate (L/min): 2 L/min FiO2 (%): 40 % -Continuous pulse oximetry maintain O2 saturation greater 90% -Continue supplemental oxygen via nasal cannula wean O2 as tolerated -Will need an amatory home O2 screen prior to discharge as well as repeat chest x-ray   Chronic Heart Failure, nonischemic cardiomyopathy.   -EF less than 20%.  Reduction in cardiac function likely due to septic cardiomyopathy -Hold outpatient hypertension medications -C/w Telemetry -Hold diuretic in setting of pressor requirement, third spacing, currently weaned off of oxygen -Appears Euvolemic at this moment  -Patient is +7.940 Liters    Electrolyte abnormalities -Electrolyte Trend: Recent Labs  Lab 10/05/22 0201 10/12/2022 0315 10/13/2022 1800 10/07/22 0355 10/08/22 0328 10/07/2022 1127 10/10/22 0711 10/11/22 0304  NA 136 134*  --  136 132* 136 137 137  K 3.1* 3.3*  --  3.2* 3.2* 3.8 3.7 3.1*  MG 1.6* 1.9  --  1.9 1.9 1.8 1.8 1.7  PHOS  --  1.1*   < > 2.0* 2.0* 2.1* 2.7 2.7   < > = values in this interval not displayed.  -Replete Potassium, Na+ and Phos with Phos-Nak -Continue to monitor and replete as necessary -Repeat electrolytes in the a.m.   LUE Hand/Arm Swelling in  the setting of Radial Artery Occulsion -US DVT/arterial duplex done and showed a radial artery occlusion -Elevate, warm compresses -Vascular consulted and took the patient for angiogram -He was found to have a high bifurcation of the radial artery with occlusion from the antecubital fossa to the wrist.  There is reconstitution of the radial artery via the interosseous artery and in the hand thrombosis of the digital arteries in the first second and third digits.  There is no flow in the digits and the palmar arch was intact.  Vascular surgery recommended hand surgery involvement and I called [name] who requested that vascular surgery call him directly for consultation.  He was resumed on a heparin drip 6 hours after his procedure. -Hand surgery evaluated and feel that there is no surgical intervention at this time and recommending the hand to demarcate to see the appropriate amputation level as he will require some irritations of his fingers   Esophageal Candidiasis -C/w Fluconazole 1/8-   Thrombocytopenia -Possibly developing in setting acute UE DVT vs drug related vs sepsis.  -Hasn't had heparin exposure here until you start on a heparin drip this morning by  the oncologist -Platelet Count Trend: Recent Labs  Lab 10/10/2022 0315 10/07/22 0355 10/08/22 0328 10/08/22 1216 09/27/2022 1127 10/10/22 0711 10/11/22 0304  PLT 62* 84* 117* 140* 143* 192 141*  -Trend CBC and repeat in the AM     AKI on chronic kidney disease secondary to shock Metabolic Acidoiss -Contine to Monitor urine output and creatinine -BUN/Cr Trend: Recent Labs  Lab 10/05/22 0201 10/27/2022 0315 10/07/22 0355 10/08/22 0328 10/07/2022 1127 10/10/22 0711 10/11/22 0304  BUN '21 17 14 12 13 18 19  '$ CREATININE 0.93 0.90 0.91 0.81 0.76 0.85 0.89  -Avoid Nephrotoxic Medications, Contrast Dyes, Hypotension and Dehydration to Ensure Adequate Renal Perfusion and will need to Renally Adjust Meds -Patient has a Slight  Metabolic Acidosis with a  CO2 of 18, AG of 10, and Chloride Level of 109 -Continue to Monitor and Trend Renal Function carefully and repeat CMP in the AM    Abnormal LFTs -LFT Trend: Recent Labs  Lab 10/04/22 0404 10/05/22 0201 10/07/22 0355 10/08/22 0328 10/23/2022 1127 10/10/22 0711 10/11/22 0304  AST 128* 91* 53* 56* 55* 53* 34  ALT 86* 54* '24 22 22 22 16  '$ -GI Following will continue to Monitor as LFTs are slowly declining   Likely ileus -Abdomen soft, +BS -Correct electrolyte abnormalities -Mobilize and he was given bowel regimen which is now stopped given the diarrhea which is now improving   Hypoalbuminemia -Patient's Albumin Trend: Recent Labs  Lab 10/04/22 0404 10/05/22 0201 10/07/22 0355 10/08/22 0328 10/04/2022 1127 10/10/22 0711 10/11/22 0304  ALBUMIN 1.6* 1.6* 1.8* 1.8* 1.8* 2.2* 1.8*  -Continue to Monitor and Trend and repeat CMP in the AM    Failure to thrive, severe protein calorie malnutrition present on admission -Advance to full diet Nutrition Status: Nutrition Problem: Severe Malnutrition Etiology: chronic illness (ampullary carcinoma with malignant biliary stricture) Signs/Symptoms: severe fat depletion, severe muscle depletion Interventions: Boost Breeze, Refer to RD note for recommendations, MVI   Diarrhea now with bloody bowel movements in the setting of likely malignant sigmoid colon tumor -Happening after his diagnosis and unclear of the CT scan findings were real artifact but GI still planning for a sigmoidoscopy prior to discharge to rule out any problems there -GI had signed the case given that his diarrhea has been improving but now given that he had a medium episode obstructed clot material and red blood earlier today the heparin drip was stopped and GI was reconsulted. -Repeat hemoglobin was stable -GI recommending a sigmoidoscopy and patient's daughters would like to proceed and plan is for a flexible sigmoidoscopy on 10/09/2021 with Dr.  Benson Norway -Flexible sigmoidoscopy done and GI feels that he has a 4 cm nonobstructing circumferential mass that was friable and with some evidence of ulceration and feel that this is likely milligram tumor and this was biopsied. -Will need further care per oncology but currently awaiting the sigmoid colon mass biopsy results   DVT prophylaxis: SCDs Start: 10/23/2022 1928    Code Status: DNR Family Communication: No family currently at bedside  Disposition Plan:  Level of care: Stepdown Status is: Inpatient Remains inpatient appropriate because: Further evaluation and clearance by specialist and PT OT recommending SNF now   Consultants:  PCCM transfer Vascular surgery Gastroenterology Medical oncology Hand Surgery  Procedures:  As Delineated as Above   FLEX SIGMOIDOSCOPY Findings:      A frond-like/villous, polypoid and ulcerated non-obstructing large mass       was found in the sigmoid colon. The mass was circumferential. The mass  measured four cm in length. No bleeding was present. This was biopsied       with a cold forceps for histology.      A large 4 cm nonobstructing circumfirential mass was noted in the       sigmoid colon. The lesion was friable and there was some evidence of       ulceration. Multiple cold biopsies were obtained. In the rectum and in       portions of the descending colon, some ulcerations were noted. Impression:               - Likely malignant tumor in the sigmoid colon.                            Biopsied.   Antimicrobials:  Anti-infectives (From admission, onward)    Start     Dose/Rate Route Frequency Ordered Stop   10/04/22 0900  fluconazole (DIFLUCAN) IVPB 200 mg        200 mg 100 mL/hr over 60 Minutes Intravenous Every 24 hours 10/04/22 0719     10/03/22 1400  ceFAZolin (ANCEF) IVPB 2g/100 mL premix        2 g 200 mL/hr over 30 Minutes Intravenous Every 8 hours 10/03/22 1025 10/08/22 2359   09/27/2022 1800  vancomycin (VANCOCIN) IVPB 1000  mg/200 mL premix  Status:  Discontinued        1,000 mg 200 mL/hr over 60 Minutes Intravenous Every 24 hours 09/28/2022 1823 10/13/2022 0801   10/27/2022 1000  fluconazole (DIFLUCAN) tablet 100 mg  Status:  Discontinued        100 mg Oral Daily 09/28/2022 1450 10/20/2022 1505   10/08/2022 0100  ceFEPIme (MAXIPIME) 2 g in sodium chloride 0.9 % 100 mL IVPB  Status:  Discontinued        2 g 200 mL/hr over 30 Minutes Intravenous Every 12 hours 10/21/2022 1823 10/03/22 1025   09/27/2022 2200  metroNIDAZOLE (FLAGYL) IVPB 500 mg  Status:  Discontinued        500 mg 100 mL/hr over 60 Minutes Intravenous Every 12 hours 10/18/2022 1823 10/03/22 1025   10/16/2022 1730  vancomycin (VANCOREADY) IVPB 1500 mg/300 mL        1,500 mg 150 mL/hr over 120 Minutes Intravenous  Once 10/25/2022 1642 09/27/2022 1951   10/08/2022 1600  piperacillin-tazobactam (ZOSYN) IVPB 3.375 g  Status:  Discontinued        3.375 g 12.5 mL/hr over 240 Minutes Intravenous Every 8 hours 10/27/2022 1451 10/21/2022 1821   09/30/2022 1500  ampicillin-sulbactam (UNASYN) 1.5 g in sodium chloride 0.9 % 100 mL IVPB  Status:  Discontinued        1.5 g 200 mL/hr over 30 Minutes Intravenous Every 8 hours 09/28/2022 1449 10/19/2022 1451   10/14/2022 1500  fluconazole (DIFLUCAN) tablet 200 mg  Status:  Discontinued        200 mg Oral  Once 09/30/2022 1450 10/04/2022 1505   10/08/2022 1300  ampicillin-sulbactam (UNASYN) 1.5 g in sodium chloride 0.9 % 100 mL IVPB        1.5 g 200 mL/hr over 30 Minutes Intravenous To Endoscopy 10/08/2022 1244 10/26/2022 1616       Subjective: Seen and Examined at bedside and the patient was doing okay.  Was about to work with therapy.  States that he slept okay.  Fingers are turning black.  Hand surgery is evaluated and waiting for his fingers to  demarcate.  Awaiting sigmoid colon mass biopsies. No other concerns or complaints at this time.   Objective: Vitals:   10/11/22 1000 10/11/22 1200 10/11/22 1600 10/11/22 1845  BP: (!) 148/85     Pulse: 75      Resp: 11     Temp:  97.9 F (36.6 C) 97.8 F (36.6 C) 97.8 F (36.6 C)  TempSrc:  Oral Oral Axillary  SpO2: 93%     Weight:      Height:        Intake/Output Summary (Last 24 hours) at 10/11/2022 1908 Last data filed at 10/11/2022 1800 Gross per 24 hour  Intake 350.01 ml  Output 800 ml  Net -449.99 ml   Filed Weights   10/20/2022 1246 10/10/22 0500 10/11/22 0314  Weight: 65.3 kg 65.2 kg 67.3 kg   Examination: Physical Exam:  Constitutional: Thin frail cachectic older appearing than his stated age AAM in NAD Respiratory: Diminished to auscultation bilaterally with coarse breath sounds, no wheezing, rales, rhonchi or crackles. Normal respiratory effort and patient is not tachypenic. No accessory muscle use. Unlabored breathing  Cardiovascular: Tachycardic rate but regular rhythm, no murmurs / rubs / gallops. S1 and S2 auscultated. Trace extremity edema.  Abdomen: Soft, non-tender, non-distended. Bowel sounds positive.  GU: Deferred. Musculoskeletal: No clubbing / cyanosis of digits/nails. No joint deformity upper and lower extremities.  Skin: Left hand is swollen and turning black.  Neurologic: CN 2-12 grossly intact with no focal deficits. Romberg sign and cerebellar reflexes not assessed.  Psychiatric: Normal judgment and insight. Alert and oriented x 3. Normal mood and appropriate affect.   Data Reviewed: I have personally reviewed following labs and imaging studies  CBC: Recent Labs  Lab 10/08/22 0328 10/08/22 1216 10/23/2022 1127 10/10/22 0711 10/11/22 0304  WBC 14.5* 17.1* 12.8* 26.5* 22.5*  NEUTROABS 11.9* 14.5* 10.7* 24.5* 21.0*  HGB 9.5* 11.2* 9.8* 12.6* 10.7*  HCT 28.1* 33.6* 29.6* 37.6* 33.5*  MCV 96.2 99.1 100.7* 98.9 103.4*  PLT 117* 140* 143* 192 370*   Basic Metabolic Panel: Recent Labs  Lab 10/07/22 0355 10/08/22 0328 10/27/2022 1127 10/10/22 0711 10/11/22 0304  NA 136 132* 136 137 137  K 3.2* 3.2* 3.8 3.7 3.1*  CL 110 107 109 109 110  CO2 19*  18* 20* 18* 19*  GLUCOSE 143* 124* 141* 103* 213*  BUN '14 12 13 18 19  '$ CREATININE 0.91 0.81 0.76 0.85 0.89  CALCIUM 7.4* 7.3* 7.5* 8.1* 7.3*  MG 1.9 1.9 1.8 1.8 1.7  PHOS 2.0* 2.0* 2.1* 2.7 2.7   GFR: Estimated Creatinine Clearance: 76.7 mL/min (by C-G formula based on SCr of 0.89 mg/dL). Liver Function Tests: Recent Labs  Lab 10/07/22 0355 10/08/22 0328 09/30/2022 1127 10/10/22 0711 10/11/22 0304  AST 53* 56* 55* 53* 34  ALT '24 22 22 22 16  '$ ALKPHOS 394* 346* 285* 331* 247*  BILITOT 4.9* 4.7* 4.8* 5.4* 4.0*  PROT 4.6* 4.5* 4.1* 5.4* 4.6*  ALBUMIN 1.8* 1.8* 1.8* 2.2* 1.8*   No results for input(s): "LIPASE", "AMYLASE" in the last 168 hours. No results for input(s): "AMMONIA" in the last 168 hours. Coagulation Profile: No results for input(s): "INR", "PROTIME" in the last 168 hours. Cardiac Enzymes: No results for input(s): "CKTOTAL", "CKMB", "CKMBINDEX", "TROPONINI" in the last 168 hours. BNP (last 3 results) Recent Labs    09/22/22 1141  PROBNP 835.0*   HbA1C: No results for input(s): "HGBA1C" in the last 72 hours. CBG: Recent Labs  Lab 10/10/22 2327 10/11/22 0309  10/11/22 0746 10/11/22 1228 10/11/22 1606  GLUCAP 179* 204* 171* 161* 157*   Lipid Profile: No results for input(s): "CHOL", "HDL", "LDLCALC", "TRIG", "CHOLHDL", "LDLDIRECT" in the last 72 hours. Thyroid Function Tests: No results for input(s): "TSH", "T4TOTAL", "FREET4", "T3FREE", "THYROIDAB" in the last 72 hours. Anemia Panel: No results for input(s): "VITAMINB12", "FOLATE", "FERRITIN", "TIBC", "IRON", "RETICCTPCT" in the last 72 hours. Sepsis Labs: No results for input(s): "PROCALCITON", "LATICACIDVEN" in the last 168 hours.  No results found for this or any previous visit (from the past 240 hour(s)).   Radiology Studies: No results found.  Scheduled Meds:  Chlorhexidine Gluconate Cloth  6 each Topical Daily   digoxin  0.125 mg Oral Daily   feeding supplement  1 Container Oral TID BM    hydrocortisone sod succinate (SOLU-CORTEF) inj  100 mg Intravenous Q8H   insulin aspart  0-9 Units Subcutaneous Q4H   multivitamin with minerals  1 tablet Oral Daily   potassium chloride  40 mEq Oral BID   sodium chloride flush  3 mL Intravenous Q12H   Continuous Infusions:  sodium chloride Stopped (10/08/22 1055)   fluconazole (DIFLUCAN) IV Stopped (10/11/22 1055)    LOS: 12 days   Raiford Noble, DO [facility] Available via Epic secure chat 7am-7pm After these hours, please refer to coverage provider listed on [email or web address] 10/11/2022, 7:08 PM

## 2022-10-11 NOTE — Progress Notes (Signed)
PT Cancellation Note  Patient Details Name: [name] [name] MRN: [id number] DOB: 1955/10/29   Cancelled Treatment:    Reason Eval/Treat Not Completed: Patient declined, states that he wants to wait until after 10:00, "it's too early now, I want to nap". Will check back another time. [place] Office [phone] Weekend [phone]  [name] 10/11/2022, 8:50 AM

## 2022-10-11 NOTE — Progress Notes (Signed)
Physical Therapy Treatment Patient Details Name: [name] [name] MRN: [id number] DOB: [birth year] Today's Date: 10/11/2022   History of Present Illness Patient is a 67 year old male who was admitted on 10/24/2022 with failure to thrive, septic shock,and persistent hyperbilirubinemia. Patient underwent ERCP and transitioned to ICU. Patient underwent angiogram 1/10 with high bifurcation of radial artery with occlusion from antecubital fossa in wrist. Ortho is following. Sigmoidoscopy on 1/13.   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 New radial artery occlusion with hand ischemia    PT Comments    The patient did mobilize to [facility] for safe transfers. Patient  declined to transfer to recliner so assisted back into bed with STEDY. Continue mobility as tolerated. HR 127 with mobility.  Recommendations for follow up therapy are one component of a multi-disciplinary discharge planning process, led by the attending physician.  Recommendations may be updated based on patient status, additional functional criteria and insurance authorization.  Follow Up Recommendations  Skilled nursing-short term rehab (<3 hours/day) Can patient physically be transported by private vehicle: No   Assistance Recommended at Discharge Frequent or constant Supervision/Assistance  Patient can return home with the following A lot of help with walking and/or transfers;A lot of help with bathing/dressing/bathroom;Assist for transportation;Help with stairs or ramp for entrance   Equipment Recommendations       Recommendations for Other Services       Precautions / Restrictions Precautions Precautions: Fall (Simultaneous filing. User may  not have seen previous data.) Precaution Comments: poor circulation in L hand pending digit amputations (Simultaneous filing. User may not have seen previous data.) Restrictions Weight Bearing Restrictions: No     Mobility  Bed Mobility               General bed mobility comments: seated on bed edge with OT    Transfers Overall transfer level: Needs assistance   Transfers: Sit to/from Stand, Bed to chair/wheelchair/BSC             General transfer comment: extra time  to rise , mod assist to power up, initially trunk flexed. cues to stand, flaps down  for transfer to and from BSc Transfer via [place]: [facility]  Ambulation/Gait                   Stairs             Wheelchair Mobility    Modified Rankin (Stroke Patients Only)       Balance Overall balance assessment: Needs assistance Sitting-balance support: Feet supported, Bilateral upper extremity supported Sitting balance-Leahy Scale: Fair     Standing balance support: During functional activity, Bilateral upper extremity supported                                Cognition Arousal/Alertness: Awake/alert Behavior During Therapy: WFL for tasks assessed/performed Overall Cognitive Status: No family/caregiver present to determine baseline cognitive functioning                         Following Commands: Follows one step commands consistently                Exercises      General  Comments        Pertinent Vitals/Pain Pain Assessment Faces Pain Scale: Hurts even more Pain Location: L wrist and hand, unable to bear weight Pain Descriptors / Indicators: Grimacing, Discomfort Pain Intervention(s): Limited activity within patient's tolerance, Monitored during session    Home Living Family/patient expects to be discharged to:: Private residence Living Arrangements: Alone Available Help at Discharge: Family;Available PRN/intermittently ("most of the  time") Type of Home: House Home Access: Stairs to enter   [employer] of Steps: 2   Home Layout: One level Home Equipment: Cane - single point Additional Comments: patient reported he could transition to daughters if needed at time of d/c.    Prior Function            PT Goals (current goals can now be found in the care plan section) Progress towards PT goals: Progressing toward goals    Frequency    Min 2X/week      PT Plan Current plan remains appropriate    Co-evaluation PT/OT/SLP Co-Evaluation/Treatment: Yes Reason for Co-Treatment: Complexity of the patient's impairments (multi-system involvement);For patient/therapist safety PT goals addressed during session: Mobility/safety with mobility OT goals addressed during session: ADL's and self-care      AM-PAC PT "6 Clicks" Mobility   Outcome Measure  Help needed turning from your back to your side while in a flat bed without using bedrails?: A Lot Help needed moving from lying on your back to sitting on the side of a flat bed without using bedrails?: A Lot Help needed moving to and from a bed to a chair (including a wheelchair)?: Total Help needed standing up from a chair using your arms (e.g., wheelchair or bedside chair)?: Total Help needed to walk in hospital room?: Total Help needed climbing 3-5 steps with a railing? : Total 6 Click Score: 8    End of Session Equipment Utilized During Treatment: Gait belt Activity Tolerance: Patient limited by fatigue Patient left: in bed;with nursing/sitter in room Nurse Communication: Mobility status PT Visit Diagnosis: Unsteadiness on feet (R26.81);Difficulty in walking, not elsewhere classified (R26.2)     Time: 1010-1038 PT Time Calculation (min) (ACUTE ONLY): 28 min  Charges:  $Therapeutic Activity: 8-22 mins                     Tresa Endo PT Acute Rehabilitation Services Office [phone] Weekend [phone]    [name] 10/11/2022, 12:37 PM

## 2022-10-12 ENCOUNTER — Encounter: Payer: Self-pay | Admitting: Hematology & Oncology

## 2022-10-12 DIAGNOSIS — E876 Hypokalemia: Secondary | ICD-10-CM | POA: Diagnosis not present

## 2022-10-12 DIAGNOSIS — R627 Adult failure to thrive: Secondary | ICD-10-CM | POA: Diagnosis not present

## 2022-10-12 DIAGNOSIS — I5022 Chronic systolic (congestive) heart failure: Secondary | ICD-10-CM | POA: Diagnosis not present

## 2022-10-12 DIAGNOSIS — C241 Malignant neoplasm of ampulla of Vater: Secondary | ICD-10-CM | POA: Diagnosis not present

## 2022-10-12 LAB — COMPREHENSIVE METABOLIC PANEL
ALT: 19 U/L (ref 0–44)
AST: 37 U/L (ref 15–41)
Albumin: 2.1 g/dL — ABNORMAL LOW (ref 3.5–5.0)
Alkaline Phosphatase: 276 U/L — ABNORMAL HIGH (ref 38–126)
Anion gap: 10 (ref 5–15)
BUN: 18 mg/dL (ref 8–23)
CO2: 19 mmol/L — ABNORMAL LOW (ref 22–32)
Calcium: 8.1 mg/dL — ABNORMAL LOW (ref 8.9–10.3)
Chloride: 109 mmol/L (ref 98–111)
Creatinine, Ser: 0.83 mg/dL (ref 0.61–1.24)
GFR, Estimated: >60 mL/min (ref >60)
Glucose, Bld: 186 mg/dL — ABNORMAL HIGH (ref 70–99)
Potassium: 4.3 mmol/L (ref 3.5–5.1)
Sodium: 138 mmol/L (ref 135–145)
Total Bilirubin: 4.6 mg/dL — ABNORMAL HIGH (ref 0.3–1.2)
Total Protein: 5.3 g/dL — ABNORMAL LOW (ref 6.5–8.1)

## 2022-10-12 LAB — PHOSPHORUS: Phosphorus: 2.5 mg/dL (ref 2.5–4.6)

## 2022-10-12 LAB — CBC WITH DIFFERENTIAL/PLATELET
Abs Immature Granulocytes: 0.07 10*3/uL (ref 0.00–0.07)
Basophils Absolute: 0 10*3/uL (ref 0.0–0.1)
Basophils Relative: 0 %
Eosinophils Absolute: 0 10*3/uL (ref 0.0–0.5)
Eosinophils Relative: 0 %
HCT: 35.9 % — ABNORMAL LOW (ref 39.0–52.0)
Hemoglobin: 11.5 g/dL — ABNORMAL LOW (ref 13.0–17.0)
Immature Granulocytes: 1 %
Lymphocytes Relative: 4 %
Lymphs Abs: 0.7 10*3/uL (ref 0.7–4.0)
MCH: 32.6 pg (ref 26.0–34.0)
MCHC: 32 g/dL (ref 30.0–36.0)
MCV: 101.7 fL — ABNORMAL HIGH (ref 80.0–100.0)
Monocytes Absolute: 0.4 10*3/uL (ref 0.1–1.0)
Monocytes Relative: 2 %
Neutro Abs: 14.3 10*3/uL — ABNORMAL HIGH (ref 1.7–7.7)
Neutrophils Relative %: 93 %
Platelets: 146 10*3/uL — ABNORMAL LOW (ref 150–400)
RBC: 3.53 MIL/uL — ABNORMAL LOW (ref 4.22–5.81)
RDW: 17 % — ABNORMAL HIGH (ref 11.5–15.5)
WBC: 15.4 10*3/uL — ABNORMAL HIGH (ref 4.0–10.5)
nRBC: 0.1 % (ref 0.0–0.2)

## 2022-10-12 LAB — GLUCOSE, CAPILLARY
Glucose-Capillary: 182 mg/dL — ABNORMAL HIGH (ref 70–99)
Glucose-Capillary: 130 mg/dL — ABNORMAL HIGH (ref 70–99)
Glucose-Capillary: 108 mg/dL — ABNORMAL HIGH (ref 70–99)
Glucose-Capillary: 98 mg/dL (ref 70–99)
Glucose-Capillary: 109 mg/dL — ABNORMAL HIGH (ref 70–99)

## 2022-10-12 LAB — SURGICAL PATHOLOGY

## 2022-10-12 LAB — MAGNESIUM: Magnesium: 2.3 mg/dL (ref 1.7–2.4)

## 2022-10-12 LAB — HEPARIN LEVEL (UNFRACTIONATED): Heparin Unfractionated: 0.41 IU/mL (ref 0.30–0.70)

## 2022-10-12 MED ORDER — INSULIN ASPART 100 UNIT/ML IJ SOLN
0.0000 [IU] | Freq: Three times a day (TID) | INTRAMUSCULAR | Status: DC
  Administered 2022-10-14 (×2): 2 [IU] via SUBCUTANEOUS

## 2022-10-12 MED ORDER — HEPARIN (PORCINE) 25000 UT/250ML-% IV SOLN
1200.0000 [IU]/h | INTRAVENOUS | Status: AC
  Administered 2022-10-12 – 2022-10-13 (×2): 1200 [IU]/h via INTRAVENOUS
  Filled 2022-10-12 (×2): qty 250

## 2022-10-12 MED ORDER — FLUCONAZOLE 100 MG PO TABS
200.0000 mg | ORAL_TABLET | Freq: Every day | ORAL | Status: DC
  Administered 2022-10-12 – 2022-10-14 (×3): 200 mg via ORAL
  Filled 2022-10-12 (×4): qty 2

## 2022-10-12 NOTE — Progress Notes (Signed)
ANTICOAGULATION CONSULT NOTE   Pharmacy Consult for heparin Indication: occluded left radial artery; ischemic left hand  No Known Allergies  Patient Measurements: Height: '6\' 1"'$  (185.4 cm) Weight: 70.9 kg (156 lb 4.9 oz) IBW/kg (Calculated) : 79.9 Heparin Dosing Weight: 63 kg  Vital Signs: Temp: 97.6 F (36.4 C) (01/16 2001) Temp Source: Oral (01/16 2001) BP: 159/84 (01/16 2000) Pulse Rate: 79 (01/16 2000)  Labs: Recent Labs    10/10/22 0711 10/11/22 0304 10/12/22 0318 10/12/22 2041  HGB 12.6* 10.7* 11.5*  --   HCT 37.6* 33.5* 35.9*  --   PLT 192 141* 146*  --   HEPARINUNFRC  --   --   --  0.41  CREATININE 0.85 0.89 0.83  --      Estimated Creatinine Clearance: 86.6 mL/min (by C-G formula based on SCr of 0.83 mg/dL).   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  He underwent a repeat ERCP on 10/12/2022 that showed esophageal plaques, hiatal hernia, pus in the major papilla, and one partially occluded stent from the biliary tree. Swelling noted in his left hand on 10/05/22. Upper extremity US performed on 10/05/22 showed occluded left radial artery and acute left superficial vein thrombosis.  Pharmacy was orignially consulted to dose heparin on 10/20/2022 for left occluded radial artery. Per Dr. Marin Olp, no bolus for heparin drip d/t low plts. Heparin was stopped 1/12 d/t rectal bleeding.  Pharmacy re-consulted to dose heparin with no bolus 1/16.   - 1/10 arteriography: Sluggish flow in the radial artery. Occlusion from the antecubital fossa to the wrist. Thrombosis of digital arteries in the first, second and third digits - No flow in these digits.  VVS recom amputation.  Today, 10/12/2022: - pharmacy re-consulted to dose heparin with no bolus Hg 11.5, PLT 146 Pt w/ large  sigmoid colon mass.  Also w/ biliary obstruction & potential 2nd malignancy.   20:41 heparin level therapeutic at 0.41 with heparin infusing at 1200 units/hr No bleeding per nurse  Goal of Therapy:  Heparin level 0.3-0.7 units/ml Monitor platelets by anticoagulation protocol: Yes   Plan:  - no bolus per MD orders -continue heparin drip at 1200 units/hr and recheck confirmatory 8 hr heparin level - Daily heparin level and CBC - monitor for s/sx bleeding  Thank you for allowing pharmacy to be a part of this patient's care.  Royetta Asal, PharmD, BCPS Clinical Pharmacist [place] Please utilize Amion for appropriate phone number to reach the unit pharmacist ([place]) 10/12/2022 9:19 PM

## 2022-10-12 NOTE — Progress Notes (Signed)
   Biopsies show adenocarcinoma (sigmoid colon).  GSU - not operative candidate  Agree w/ goals of care consult.  Signing off  [name], MD, [facility] See [name] on call - gastroenterology for best contact person 10/12/2022 3:02 PM

## 2022-10-12 NOTE — Progress Notes (Signed)
Unfortunately, a lot is going on with Mr. Borntreger.  He had a sigmoidoscopy over the weekend.  He was found to have a large sigmoid colonic mass.  This was biopsied.  There biopsy is not back yet but have to believe this is going to be adenocarcinoma.  He had bleeding from the rectum.  He was on anticoagulation because of the arterial embolism that is causing digital ischemia in the left hand.  He is clearly hypercoagulable from his underlying malignancy.  The anticoagulation has been on hold.  I think we really, really need to restart this to try to help with the left hand.  He is going need to have some amputations according to orthopedic surgery.  His CEA about 3 weeks ago was 256.  I thought that this was elevated because of his biliary obstruction.  His total bilirubin is still a little on the higher side at 4.6.  I cannot recall ever the seen colon cancer metastasized to the biliary tree.  I have to believe that he may have 2 separate malignancies.  Again we do not have the pathology back from the sigmoid cancer.  Regardless, this cancer needs to be taken care of.  His clinic because of problems with respect to bleeding and possible obstruction.  Ideally, surgery would go and and remove the tumor.  I still see that they would be enthusiastic for doing this due to his congestive heart failure and poor ejection fraction of 20%.  However, we could always ask him to see what they have to say.  If surgery is not an option, the only option would be radiation therapy.  This clearly would not get rid of the tumor.  Will try to help prevent bleeding and hopefully help prevent obstruction.  I am unsure if he would be a candidate for concurrent chemotherapy with the radiation.  Again, he really needs to be on anticoagulation.  If not, there maybe Orthopedic Surgery or Vascular Surgery can do his digital amputations.  He says he is eating a little bit better.  This is certainly encouraging.  There  is no obvious sepsis.  His bilirubin is 4.6.  His albumin is 2.1.  Hopefully, this will continue to trend upward.  White blood cell count is 15.4.  Hemoglobin 11.5.  Platelet count 146,000.   His vital signs show temperature of 98.  Pulse 95.  Blood pressure 162/81.  His lungs sound clear bilaterally.  He has good air movement bilaterally  Cardiac exam is regular rate.  He has frequent premature beats.  He has no murmurs.  Abdomen is soft.  He has decent bowel sounds.  There is no fluid wave.  There is no palpable liver or spleen tip.  Extremities shows the swelling and ischemia in the first 3 fingers of the left hand.  He does have decent radial pulse.  Neurological exam is nonfocal.  We are going to have to figure out how to deal with his sigmoid colonic mass.  Again, I would see if surgery would be willing to remove this.  Again I am not sure that the would.  Possibly, they might be able to do it laparoscopically.  If not, then I would see if Radiation Oncology might be able to help.  I will have to restart the heparin.  I just think that he needs to be on anticoagulation.  I do appreciate how well he is can care for by the ICU staff.  This is incredibly complicated.  Kerby Nora, MD  Darlyn Chamber 17:14

## 2022-10-12 NOTE — Progress Notes (Signed)
Called to review this patient's chart as he was recently diagnosed with ampullary carcinoma and was admitted secondary to septic shock on 4 pressors this admission.  He has been found to have a radial artery thrombus with an ischemic hand that ortho has seen.  He may require amputation of multiple fingers.  He has been noted to have some blood per rectum as well.  He has not required any blood and his hgb is around 11.5.  he underwent a sigmoidoscopy yesterday that revealed a nonobstructing sigmoid colon mass.  Unfortunately the patient also has an AICD with NICM with an EF of <20%.  This case has been discussed with Dr. Rosendo Gros.  Unfortunately, the patient has 2 likely primary malignancies and is in poor health.  Laparoscopic or open surgical intervention will not be good on his heart, particularly lap due to decrease venous return from gaseous distention.  We would NOT recommend surgical intervention.  We would recommend a palliative care consult for determining [name], etc for this ill patient.  Relayed this to the primary team as well as oncology.    [name] [name] 9:34 AM 10/12/2022

## 2022-10-12 NOTE — Progress Notes (Signed)
ANTICOAGULATION CONSULT NOTE   Pharmacy Consult for heparin Indication: occluded left radial artery; ischemic left hand  No Known Allergies  Patient Measurements: Height: '6\' 1"'$  (517.6 cm) Weight: 70.9 kg (156 lb 4.9 oz) IBW/kg (Calculated) : 79.9 Heparin Dosing Weight: 63 kg  Vital Signs: Temp: 98 F (36.7 C) (01/16 0415) Temp Source: Oral (01/16 0415) BP: 162/81 (01/16 0600) Pulse Rate: 95 (01/16 0600)  Labs: Recent Labs    10/10/22 0711 10/11/22 0304 10/12/22 0318  HGB 12.6* 10.7* 11.5*  HCT 37.6* 33.5* 35.9*  PLT 192 141* 146*  CREATININE 0.85 0.89 0.83     Estimated Creatinine Clearance: 86.6 mL/min (by C-G formula based on SCr of 0.83 mg/dL).   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  He underwent a repeat ERCP on 09/30/2022 that showed esophageal plaques, hiatal hernia, pus in the major papilla, and one partially occluded stent from the biliary tree. Swelling noted in his left hand on 10/05/22. Upper extremity US performed on 10/05/22 showed occluded left radial artery and acute left superficial vein thrombosis.  Pharmacy was orignially consulted to dose heparin on 09/27/2022 for left occluded radial artery. Per Dr. Marin Olp, no bolus for heparin drip d/t low plts. Heparin was stopped 1/12 d/t rectal bleeding.  Pharmacy re-consulted to dose heparin with no bolus 1/16.   - 1/10 arteriography: Sluggish flow in the radial artery. Occlusion from the antecubital fossa to the wrist. Thrombosis of digital arteries in the first, second and third digits - No flow in these digits.  VVS recom amputation.  Today, 10/12/2022: - pharmacy re-consulted to dose heparin with no bolus Hg 11.5, PLT 146 Pt w/ large sigmoid colon mass.  Also w/ biliary obstruction & potential 2nd  malignancy.   Goal of Therapy:  Heparin level 0.3-0.7 units/ml Monitor platelets by anticoagulation protocol: Yes   Plan:  - no bolus per MD orders -start heparin drip at previously therapeutic rate of 1200 units/hr and check 8 hr heparin level - Daily heparin level and CBC - monitor for s/sx bleeding  Eudelia Bunch, Pharm.D Use secure chat for questions 10/12/2022 7:53 AM

## 2022-10-12 NOTE — Progress Notes (Signed)
PROGRESS NOTE    [name] [name]  HBZ:[id number] DOB: 04/01/1956 DOA: 10/24/2022 PCP: Terrilyn Saver, NP   Brief Narrative:  [name] [name] is a 67 y.o. 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    Patient has a diagnosis of cholangiocarcinoma with a malignant biliary stricture status post stenting on 09/17/2022 treated with stent obstruction.  He is noted to have septic shock on admission and his WBC elevated to 38.7 on 10/01/2021.  He was given a dose of IV Unasyn and underwent repeat ERCP and stent replacement with findings of purulent discharge and cholangitis.  He was septic postprocedure with shock and PCCM was called for admission to the ICU.  He was admitted on 09/29/2021 and then had an ERCP on 10/01/2021 and transferred to the ICU with septic shock.  He is made DNR on 10/04/2021 after discussion with the family.  Repeat echocardiogram showed an EF of less than 20%.  He has swelling of her left upper extremity overnight and arterial duplex was ordered as well as a venous duplex.  Patient was subsequently weaned off of pressors from his septic shock.  The arterial duplex showed a left obstruction noted in the radial artery.  He was initiated on a heparin drip and the medical oncologist and vascular took him for an angiogram and he was found to have a high bifurcation of the radial artery with occlusion from the antecubital fossa to the wrist.  There is reconstitution of the radial artery via the interosseous artery and in the hand thrombosis of the digital arteries in the first second and third digits.  There is no flow in the digits and the palmar arch was intact.  Vascular surgery recommended hand surgery involvement and I called [name] who requested that  vascular surgery call him directly for consultation.  He was resumed on a heparin drip 6 hours after his procedure.   GI still following given his septic shock from Klebsiella.  They are recommending considering sigmoidoscopy at some point prior to discharge to evaluate the abnormal sigmoid wall thickening on the prior CT scan that was done without oral or IV contrast.  Given his improvement in his diarrhea and they are regular readdress it and reconsider flex sigmoidoscopy  due to some abnormal finding of the wall thickening in the sigmoid colon due to his other acute issues. Currently awaiting Hand Surgery evaluation and I spoke to the physician on-call today [name] who states he will be by in the morning given that the prior physician I was called and never came to evaluate the patient.   Patient was doing okay today and was on a heparin drip however started having some bright red clots with his bowel movements so his heparin drip was held and GI was reconsulted and the plan is to proceed with a flexible sigmoidoscopy with [name] in the morning   Sigomoidoscopy done and showed a frond-like/villous polypoid and ulcerated nonobstructing large mass found in the sigmoid colon which was circumferential and measured 4 cm in length with no bleeding present.  This was biopsied with cold forceps and a large 4 cm nonfunctioning circumferential mass noted at the sigmoid colon which was friable and some evidence of ulceration and multiple cold biopsies were obtained in the rectum and in the portion.  Patient likely has malignant tumor of the history of the colon which was biopsied.  Hand surgery evaluated and there is no current plans for surgical intervention at this point and likely will require amputation of multiple fingers but they are going away and allow the fingers to demarcate to ensure appropriate amputation level  The sigmoid colon mass biopsy results show adenocarcinoma and general surgery was  consulted for further evaluation however after they evaluated the patient's case they felt like he has 2 different primary malignancies and remains in poor health and felt that a laparoscopic or open surgical intervention would not be good on his heart particularly due to the decreased venous return from gaseous distention.  They recommended not proceeding with any surgical intervention recommending palliative care for further goals of care consult.  Gastroenterology also recommending palliative care.  At this time oncology is recommending continuing heparin drip and recommending radiation oncology to see and they will see the patient in the morning given that he is not a surgical candidate.  Assessment and Plan:  Septic shock secondary to cholangitis, shock resolved and sepsis has improved Biliary obstruction secondary to cholangiocarcinoma with Hyperbilirubinemia Klebsiella Bacteremia -Post ERCP with stent replacement -Shock is improving and he has been weaned off of pressors Recent Labs  Lab 10/07/22 0355 10/08/22 0328 10/08/22 1216 10/08/2022 1127 10/10/22 0711 10/11/22 0304 10/12/22 0318  WBC 13.5* 14.5* 17.1* 12.8* 26.5* 22.5* 15.4*  -Patient's WBC worsened in the setting of Flex Sig -Bilirubin went from 6.6 -> 4.9 -> 4.7 -> 5.4 -> 4.0 -> 4.6 -C/w Ancef at least through 1/10 which will provide coverage through 5 days after source control -Continue to Monitor and Trend and Repeat CBC in the AM  -PT/OT Recommending SNF   Acute Respiratory Failure Secondary to Sepsis PT/OT, mobilize -SpO2: 93 % O2 Flow Rate (L/min): 2 L/min FiO2 (%): 40 % -Continuous pulse oximetry maintain O2 saturation greater 90% -Continue supplemental oxygen via nasal cannula wean O2 as tolerated -Will need an ambulatory home O2 screen prior to discharge as well as repeat chest x-ray   Chronic Heart Failure, nonischemic cardiomyopathy.   -EF less than 20%.  Reduction in cardiac function likely due to septic  cardiomyopathy -Hold outpatient hypertension medications -C/w Telemetry -Hold diuretic in setting of pressor requirement, third spacing, currently weaned off of oxygen -Appears Euvolemic at this moment  -Patient is +7.400 Liters    Electrolyte abnormalities -Electrolyte Trend: Recent Labs  Lab 10/22/2022 0315 10/08/2022 1800 10/07/22 0355 10/08/22 0328 10/08/2022 1127 10/10/22 0711 10/11/22 0304 10/12/22 0318  NA 134*  --  136 132* 136 137 137 138  K 3.3*  --  3.2* 3.2* 3.8 3.7 3.1* 4.3  MG 1.9  --  1.9 1.9 1.8 1.8 1.7 2.3  PHOS 1.1*   < > 2.0* 2.0* 2.1* 2.7 2.7 2.5   < > = values in this interval not displayed.  -Replete Potassium, Na+ and Phos with Phos-Nak -Continue to monitor and replete as necessary -Repeat electrolytes in the a.m.   LUE Hand/Arm Swelling in the setting of Radial Artery Occulsion -US DVT/arterial duplex done and showed a radial artery occlusion -Elevate, warm compresses -Vascular consulted and took the patient for angiogram -He was found to have a high bifurcation of the radial artery with occlusion from the antecubital fossa to the wrist.  There is reconstitution of the radial artery via the interosseous artery and in the hand thrombosis of the digital arteries in the first second and third digits.  There is no flow in the digits and the palmar arch was intact.  Vascular surgery recommended hand surgery involvement and I called [name] who requested that vascular surgery call him directly for consultation with [name] never evaluated so I had to end up calling [name].  He was resumed on a heparin drip 6 hours after his procedure. -Hand surgery [name] evaluated and feel that there is no surgical intervention at this time and recommending the hand to demarcate to see the appropriate amputation level as he will require some irritations of his fingers -Currently being continued on heparin and allowing the fingers to demarcate   Esophageal  Candidiasis -C/w Fluconazole 1/8 and has a stop date of 10/17/2022   Thrombocytopenia -Possibly developing in setting acute UE DVT vs drug related vs sepsis.  -Hasn't had heparin exposure here until you start on a heparin drip this morning by the oncologist -Platelet Count Trend: Recent Labs  Lab 10/07/22 0355 10/08/22 0328 10/08/22 1216 10/19/2022 1127 10/10/22 0711 10/11/22 0304 10/12/22 0318  PLT 84* 117* 140* 143* 192 141* 146*  -Trend CBC and repeat in the AM     AKI on chronic kidney disease secondary to shock Metabolic Acidoiss -Contine to Monitor urine output and creatinine -BUN/Cr Trend: Recent Labs  Lab 10/13/2022 0315 10/07/22 0355 10/08/22 0328 10/12/2022 1127 10/10/22 0711 10/11/22 0304 10/12/22 0318  BUN '17 14 12 13 18 19 18  '$ CREATININE 0.90 0.91 0.81 0.76 0.85 0.89 0.83  -Avoid Nephrotoxic Medications, Contrast Dyes, Hypotension and Dehydration to Ensure Adequate Renal Perfusion and will need to Renally Adjust Meds -Patient has a Slight Metabolic Acidosis with a  CO2 of 18, AG of 10, and Chloride Level of 109 -Continue to Monitor and Trend Renal Function carefully and repeat CMP in the AM    Abnormal LFTs -LFT Trend: Recent Labs  Lab 10/05/22 0201 10/07/22 0355 10/08/22 0328 09/30/2022 1127 10/10/22 0711 10/11/22 0304 10/12/22 0318  AST 91* 53* 56* 55* 53* 34 37  ALT 54* '24 22 22 22 16 19  '$ -GI Following will continue to Monitor as LFTs are slowly declining   Likely ileus -Abdomen soft, +BS -Correct electrolyte abnormalities -Mobilize and he was given bowel regimen which is now stopped given the diarrhea which is now improving   Hypoalbuminemia -Patient's Albumin Trend: Recent Labs  Lab 10/05/22 0201 10/07/22 0355 10/08/22 0328 10/13/2022 1127 10/10/22 0711 10/11/22 0304 10/12/22 0318  ALBUMIN 1.6* 1.8* 1.8* 1.8* 2.2* 1.8* 2.1*  -Continue to Monitor and Trend and repeat CMP in the AM    Failure to thrive, severe protein calorie malnutrition  present on admission -Advance to full diet Nutrition Status: Nutrition Problem: Severe Malnutrition Etiology: chronic illness (ampullary carcinoma with malignant biliary stricture) Signs/Symptoms: severe fat depletion, severe muscle depletion Interventions: Boost Breeze, Refer to RD note for recommendations, MVI  Diarrhea with bloody bowel movement in the setting of malignant sigmoid adenocarcinoma -Happening after his diagnosis and unclear of the CT scan findings were real artifact but GI still planning for a sigmoidoscopy prior to discharge to rule out any problems there -GI had signed the case given that his diarrhea has been improving but now given that he had a medium episode obstructed clot material and red blood earlier today the heparin drip was stopped and GI was reconsulted. -Repeat hemoglobin was stable -GI recommending a sigmoidoscopy and patient's daughters would like to proceed and plan is for a flexible sigmoidoscopy on 10/09/2021 with [name] -Flexible sigmoidoscopy done and  GI feels that he has a 4 cm nonobstructing circumferential mass that was friable and with some evidence of ulceration and feel that this is likely milligram tumor and this was biopsied. -Sigmoid colon biopsy showed adenocarcinoma and general surgery evaluated and felt that he is not an operative candidate and they are recommending palliative care goals of care discussion.  Oncology feels however that radiation oncology should get involved and he is to get radiation therapy now and possibly chemotherapy as well however his prognosis extremely poor and needs continued goals of care discussion  DVT prophylaxis: SCDs Start: 10/10/2022 1928    Code Status: DNR Family Communication: No family currently at bedside  Disposition Plan:  Level of care: Stepdown Status is: Inpatient Remains inpatient appropriate because: Oncology is now calling radiology and is now going to get radiation evaluation but he really needs  further goals of care discussion and discussion about palliative given that he has 2 different malignancies and because his prognosis is extremely poor   Consultants:  PCCM transfer Vascular surgery Gastroenterology Medical oncology Hand Surgery Radiation oncology  Procedures:  As Delineated as Above   FLEX SIGMOIDOSCOPY Findings:      A frond-like/villous, polypoid and ulcerated non-obstructing large mass       was found in the sigmoid colon. The mass was circumferential. The mass       measured four cm in length. No bleeding was present. This was biopsied       with a cold forceps for histology.      A large 4 cm nonobstructing circumfirential mass was noted in the       sigmoid colon. The lesion was friable and there was some evidence of       ulceration. Multiple cold biopsies were obtained. In the rectum and in       portions of the descending colon, some ulcerations were noted. Impression:               - Likely malignant tumor in the sigmoid colon.                            Biopsied.  Antimicrobials:  Anti-infectives (From admission, onward)    Start     Dose/Rate Route Frequency Ordered Stop   10/12/22 1000  fluconazole (DIFLUCAN) tablet 200 mg        200 mg Oral Daily 10/12/22 0845 10/18/22 0959   10/04/22 0900  fluconazole (DIFLUCAN) IVPB 200 mg  Status:  Discontinued        200 mg 100 mL/hr over 60 Minutes Intravenous Every 24 hours 10/04/22 0719 10/12/22 0845   10/03/22 1400  ceFAZolin (ANCEF) IVPB 2g/100 mL premix        2 g 200 mL/hr over 30 Minutes Intravenous Every 8 hours 10/03/22 1025 10/08/22 2359   10/26/2022 1800  vancomycin (VANCOCIN) IVPB 1000 mg/200 mL premix  Status:  Discontinued        1,000 mg 200 mL/hr over 60 Minutes Intravenous Every 24 hours 10/05/2022 1823 10/10/2022 0801   10/27/2022 1000  fluconazole (DIFLUCAN) tablet 100 mg  Status:  Discontinued        100 mg Oral Daily 10/18/2022 1450 09/30/2022 1505   10/27/2022 0100  ceFEPIme (MAXIPIME) 2 g in  sodium chloride 0.9 % 100 mL IVPB  Status:  Discontinued        2 g 200 mL/hr over 30 Minutes Intravenous Every 12 hours 10/16/2022 1823 10/03/22  1025   10/08/2022 2200  metroNIDAZOLE (FLAGYL) IVPB 500 mg  Status:  Discontinued        500 mg 100 mL/hr over 60 Minutes Intravenous Every 12 hours 09/30/2022 1823 10/03/22 1025   10/17/2022 1730  vancomycin (VANCOREADY) IVPB 1500 mg/300 mL        1,500 mg 150 mL/hr over 120 Minutes Intravenous  Once 10/18/2022 1642 10/11/2022 1951   10/18/2022 1600  piperacillin-tazobactam (ZOSYN) IVPB 3.375 g  Status:  Discontinued        3.375 g 12.5 mL/hr over 240 Minutes Intravenous Every 8 hours 10/10/2022 1451 09/28/2022 1821   09/28/2022 1500  ampicillin-sulbactam (UNASYN) 1.5 g in sodium chloride 0.9 % 100 mL IVPB  Status:  Discontinued        1.5 g 200 mL/hr over 30 Minutes Intravenous Every 8 hours 10/16/2022 1449 09/27/2022 1451   10/05/2022 1500  fluconazole (DIFLUCAN) tablet 200 mg  Status:  Discontinued        200 mg Oral  Once 10/26/2022 1450 10/20/2022 1505   09/28/2022 1300  ampicillin-sulbactam (UNASYN) 1.5 g in sodium chloride 0.9 % 100 mL IVPB        1.5 g 200 mL/hr over 30 Minutes Intravenous To Endoscopy 10/08/2022 1244 10/08/2022 1616       Subjective: Seen and examined at bedside and he is doing okay but wanting to sleep.  Fingers are turning black or.  He denies any more bloody bowel movements.  Sigmoid mass biopsies revealed adenocarcinoma so general surgery evaluated feel he is not a surgical candidate.  Oncology is calling radiation oncology for further evaluation.  He denies any other concerns or complaints at this time.  Objective: Vitals:   10/12/22 1700 10/12/22 1800 10/12/22 2000 10/12/22 2001  BP: (!) 156/87 (!) 166/98 (!) 159/84   Pulse: 74 (!) 104 79   Resp: (!) '7 13 15   '$ Temp:    97.6 F (36.4 C)  TempSrc:    Oral  SpO2: 92% 95% 93%   Weight:      Height:        Intake/Output Summary (Last 24 hours) at 10/12/2022 2041 Last data filed at 10/12/2022  1800 Gross per 24 hour  Intake 660 ml  Output 750 ml  Net -90 ml   Filed Weights   10/10/22 0500 10/11/22 0314 10/12/22 0415  Weight: 65.2 kg 67.3 kg 70.9 kg   Examination: Physical Exam:  Constitutional: Thin and frail cachectic older appearing than his stated age African-American male in no acute distress appears calm and wanting to sleep Respiratory: Diminished to auscultation bilaterally, no wheezing, rales, rhonchi or crackles. Normal respiratory effort and patient is not tachypenic. No accessory muscle use.  Unlabored breathing Cardiovascular: Irregularly irregular and slightly tachycardic, no murmurs / rubs / gallops. S1 and S2 auscultated.  Mildly appreciable extremity edema Abdomen: Soft, non-tender, non-distended.  Bowel sounds positive.  GU: Deferred. Musculoskeletal: No clubbing / cyanosis of digits/nails. No joint deformity upper and lower extremities but hand on the left side is extremely swollen erythematous and started to turn black and dusky Skin: As above left hand is swollen turning black Neurologic: CN 2-12 grossly intact with no focal deficits.Romberg sign and cerebellar reflexes not assessed.  Psychiatric: Normal judgment and insight. Alert and oriented x 3. Normal mood and appropriate affect.   Data Reviewed: I have personally reviewed following labs and imaging studies  CBC: Recent Labs  Lab 10/08/22 1216 10/05/2022 1127 10/10/22 0711 10/11/22 0304 10/12/22 0318  WBC  17.1* 12.8* 26.5* 22.5* 15.4*  NEUTROABS 14.5* 10.7* 24.5* 21.0* 14.3*  HGB 11.2* 9.8* 12.6* 10.7* 11.5*  HCT 33.6* 29.6* 37.6* 33.5* 35.9*  MCV 99.1 100.7* 98.9 103.4* 101.7*  PLT 140* 143* 192 141* 812*   Basic Metabolic Panel: Recent Labs  Lab 10/08/22 0328 10/20/2022 1127 10/10/22 0711 10/11/22 0304 10/12/22 0318  NA 132* 136 137 137 138  K 3.2* 3.8 3.7 3.1* 4.3  CL 107 109 109 110 109  CO2 18* 20* 18* 19* 19*  GLUCOSE 124* 141* 103* 213* 186*  BUN '12 13 18 19 18  '$ CREATININE  0.81 0.76 0.85 0.89 0.83  CALCIUM 7.3* 7.5* 8.1* 7.3* 8.1*  MG 1.9 1.8 1.8 1.7 2.3  PHOS 2.0* 2.1* 2.7 2.7 2.5   GFR: Estimated Creatinine Clearance: 86.6 mL/min (by C-G formula based on SCr of 0.83 mg/dL). Liver Function Tests: Recent Labs  Lab 10/08/22 0328 10/23/2022 1127 10/10/22 0711 10/11/22 0304 10/12/22 0318  AST 56* 55* 53* 34 37  ALT '22 22 22 16 19  '$ ALKPHOS 346* 285* 331* 247* 276*  BILITOT 4.7* 4.8* 5.4* 4.0* 4.6*  PROT 4.5* 4.1* 5.4* 4.6* 5.3*  ALBUMIN 1.8* 1.8* 2.2* 1.8* 2.1*   No results for input(s): "LIPASE", "AMYLASE" in the last 168 hours. No results for input(s): "AMMONIA" in the last 168 hours. Coagulation Profile: No results for input(s): "INR", "PROTIME" in the last 168 hours. Cardiac Enzymes: No results for input(s): "CKTOTAL", "CKMB", "CKMBINDEX", "TROPONINI" in the last 168 hours. BNP (last 3 results) Recent Labs    09/22/22 1141  PROBNP 835.0*   HbA1C: No results for input(s): "HGBA1C" in the last 72 hours. CBG: Recent Labs  Lab 10/12/22 0318 10/12/22 0730 10/12/22 1148 10/12/22 1550 10/12/22 1939  GLUCAP 182* 130* 108* 98 109*   Lipid Profile: No results for input(s): "CHOL", "HDL", "LDLCALC", "TRIG", "CHOLHDL", "LDLDIRECT" in the last 72 hours. Thyroid Function Tests: No results for input(s): "TSH", "T4TOTAL", "FREET4", "T3FREE", "THYROIDAB" in the last 72 hours. Anemia Panel: No results for input(s): "VITAMINB12", "FOLATE", "FERRITIN", "TIBC", "IRON", "RETICCTPCT" in the last 72 hours. Sepsis Labs: No results for input(s): "PROCALCITON", "LATICACIDVEN" in the last 168 hours.  No results found for this or any previous visit (from the past 240 hour(s)).   Radiology Studies: No results found.  Scheduled Meds:  Chlorhexidine Gluconate Cloth  6 each Topical Daily   digoxin  0.125 mg Oral Daily   feeding supplement  1 Container Oral TID BM   fluconazole  200 mg Oral Daily   insulin aspart  0-9 Units Subcutaneous TID AC & HS    multivitamin with minerals  1 tablet Oral Daily   sodium chloride flush  3 mL Intravenous Q12H   Continuous Infusions:  sodium chloride Stopped (10/12/22 0408)   heparin 1,200 Units/hr (10/12/22 1803)    LOS: 13 days   Raiford Noble, DO [facility] Available via Epic secure chat 7am-7pm After these hours, please refer to coverage provider listed on [email or web address] 10/12/2022, 8:41 PM

## 2022-10-13 ENCOUNTER — Inpatient Hospital Stay (HOSPITAL_COMMUNITY): Payer: Medicare Other

## 2022-10-13 ENCOUNTER — Other Ambulatory Visit: Payer: Self-pay

## 2022-10-13 ENCOUNTER — Ambulatory Visit
Admit: 2022-10-13 | Discharge: 2022-10-13 | Disposition: A | Discharge status: Home / Self Care | Payer: Medicare Other | Attending: Radiation Oncology | Admitting: Radiation Oncology

## 2022-10-13 ENCOUNTER — Inpatient Hospital Stay: Payer: Medicare Other | Admitting: Dietician

## 2022-10-13 DIAGNOSIS — R4589 Other symptoms and signs involving emotional state: Secondary | ICD-10-CM

## 2022-10-13 DIAGNOSIS — C187 Malignant neoplasm of sigmoid colon: Secondary | ICD-10-CM

## 2022-10-13 DIAGNOSIS — R627 Adult failure to thrive: Secondary | ICD-10-CM | POA: Diagnosis not present

## 2022-10-13 DIAGNOSIS — Z515 Encounter for palliative care: Secondary | ICD-10-CM | POA: Diagnosis not present

## 2022-10-13 DIAGNOSIS — E43 Unspecified severe protein-calorie malnutrition: Secondary | ICD-10-CM | POA: Diagnosis not present

## 2022-10-13 DIAGNOSIS — C241 Malignant neoplasm of ampulla of Vater: Secondary | ICD-10-CM | POA: Diagnosis not present

## 2022-10-13 LAB — CBC WITH DIFFERENTIAL/PLATELET
Abs Immature Granulocytes: 0.14 10*3/uL — ABNORMAL HIGH (ref 0.00–0.07)
Basophils Absolute: 0 10*3/uL (ref 0.0–0.1)
Basophils Relative: 0 %
Eosinophils Absolute: 0 10*3/uL (ref 0.0–0.5)
Eosinophils Relative: 0 %
HCT: 42.6 % (ref 39.0–52.0)
Hemoglobin: 13.8 g/dL (ref 13.0–17.0)
Immature Granulocytes: 1 %
Lymphocytes Relative: 5 %
Lymphs Abs: 1.2 10*3/uL (ref 0.7–4.0)
MCH: 32.8 pg (ref 26.0–34.0)
MCHC: 32.4 g/dL (ref 30.0–36.0)
MCV: 101.2 fL — ABNORMAL HIGH (ref 80.0–100.0)
Monocytes Absolute: 0.5 10*3/uL (ref 0.1–1.0)
Monocytes Relative: 2 %
Neutro Abs: 20.1 10*3/uL — ABNORMAL HIGH (ref 1.7–7.7)
Neutrophils Relative %: 92 %
Platelets: 157 10*3/uL (ref 150–400)
RBC: 4.21 MIL/uL — ABNORMAL LOW (ref 4.22–5.81)
RDW: 16.7 % — ABNORMAL HIGH (ref 11.5–15.5)
WBC: 21.9 10*3/uL — ABNORMAL HIGH (ref 4.0–10.5)
nRBC: 0.1 % (ref 0.0–0.2)

## 2022-10-13 LAB — GLUCOSE, CAPILLARY
Glucose-Capillary: 183 mg/dL — ABNORMAL HIGH (ref 70–99)
Glucose-Capillary: 64 mg/dL — ABNORMAL LOW (ref 70–99)
Glucose-Capillary: 62 mg/dL — ABNORMAL LOW (ref 70–99)
Glucose-Capillary: 120 mg/dL — ABNORMAL HIGH (ref 70–99)
Glucose-Capillary: 97 mg/dL (ref 70–99)

## 2022-10-13 LAB — COMPREHENSIVE METABOLIC PANEL
ALT: 18 U/L (ref 0–44)
AST: 46 U/L — ABNORMAL HIGH (ref 15–41)
Albumin: 2.2 g/dL — ABNORMAL LOW (ref 3.5–5.0)
Alkaline Phosphatase: 243 U/L — ABNORMAL HIGH (ref 38–126)
Anion gap: 9 (ref 5–15)
BUN: 23 mg/dL (ref 8–23)
CO2: 20 mmol/L — ABNORMAL LOW (ref 22–32)
Calcium: 8.3 mg/dL — ABNORMAL LOW (ref 8.9–10.3)
Chloride: 109 mmol/L (ref 98–111)
Creatinine, Ser: 0.94 mg/dL (ref 0.61–1.24)
GFR, Estimated: >60 mL/min (ref >60)
Glucose, Bld: 99 mg/dL (ref 70–99)
Potassium: 5 mmol/L (ref 3.5–5.1)
Sodium: 138 mmol/L (ref 135–145)
Total Bilirubin: 5.1 mg/dL — ABNORMAL HIGH (ref 0.3–1.2)
Total Protein: 5.2 g/dL — ABNORMAL LOW (ref 6.5–8.1)

## 2022-10-13 LAB — HEMOGLOBIN AND HEMATOCRIT, BLOOD
HCT: 34.8 % — ABNORMAL LOW (ref 39.0–52.0)
Hemoglobin: 11.3 g/dL — ABNORMAL LOW (ref 13.0–17.0)

## 2022-10-13 LAB — HEPARIN LEVEL (UNFRACTIONATED): Heparin Unfractionated: 0.33 IU/mL (ref 0.30–0.70)

## 2022-10-13 LAB — MAGNESIUM: Magnesium: 2.3 mg/dL (ref 1.7–2.4)

## 2022-10-13 LAB — PHOSPHORUS: Phosphorus: 3 mg/dL (ref 2.5–4.6)

## 2022-10-13 MED ORDER — ENSURE ENLIVE PO LIQD
237.0000 mL | Freq: Two times a day (BID) | ORAL | Status: DC
  Administered 2022-10-13: 237 mL via ORAL

## 2022-10-13 MED ORDER — DRONABINOL 2.5 MG PO CAPS
2.5000 mg | ORAL_CAPSULE | Freq: Every day | ORAL | Status: DC
  Administered 2022-10-13 – 2022-10-14 (×2): 2.5 mg via ORAL
  Filled 2022-10-13 (×2): qty 1

## 2022-10-13 MED ORDER — ENOXAPARIN SODIUM 60 MG/0.6ML IJ SOSY
60.0000 mg | PREFILLED_SYRINGE | Freq: Two times a day (BID) | INTRAMUSCULAR | Status: DC
  Administered 2022-10-13 – 2022-10-14 (×2): 60 mg via SUBCUTANEOUS
  Filled 2022-10-13 (×2): qty 0.6

## 2022-10-13 MED ORDER — DEXTROSE-NACL 5-0.45 % IV SOLN
INTRAVENOUS | Status: AC
  Administered 2022-10-13: 50 mL/h via INTRAVENOUS

## 2022-10-13 MED ORDER — SODIUM CHLORIDE 0.9 % IV BOLUS
250.0000 mL | Freq: Once | INTRAVENOUS | Status: AC
  Administered 2022-10-13: 250 mL via INTRAVENOUS

## 2022-10-13 MED ORDER — SODIUM CHLORIDE 0.9 % IV BOLUS
1000.0000 mL | Freq: Once | INTRAVENOUS | Status: AC
  Administered 2022-10-13: 1000 mL via INTRAVENOUS

## 2022-10-13 MED ORDER — METOPROLOL TARTRATE 12.5 MG HALF TABLET
12.5000 mg | ORAL_TABLET | Freq: Two times a day (BID) | ORAL | Status: DC
  Administered 2022-10-13 – 2022-10-14 (×2): 12.5 mg via ORAL
  Filled 2022-10-13 (×2): qty 1

## 2022-10-13 NOTE — Progress Notes (Signed)
[name] does not look as strong today.  He does not sound as strong today.  His bilirubin is slowly trending back up.  He is on heparin.  There is been no bleeding.  His hemoglobin has been going up.  Is now 13.8.  His white cell count is also on the higher side at 21.9.  I do worry about the possibility of infection again.  He has a vast majority of neutrophils.  He probably needs to have cultures drawn.  He did not eat much yesterday.  He says his appetite is not as good.  I know that Radiation Oncology did see him.  I believe that radiation therapy is can be the best way to try to help with the colon cancer and also with his ampullary cancer.  I just do not see that he is a candidate for combined radiation with chemotherapy.  We just want quality of life.  He has a very poor ejection fraction.  I still think that it would be as cardiac status that we will determine his prognosis.  I do not know when/if he is going to have the surgery for his left hand.  Again he is on heparin.  Pharmacy is doing a wonderful job in managing this.  Of note, his albumin has been trending upward.  It was 2.2 today.  Again bilirubin was 5.1.  His BUN is 23 creatinine 0.94.  I do think that physical therapy and Occupational Therapy have been tried to work with him to try to help his overall quality of life and his independence.  He has had no cough.  He has had no complaints of pain.  There is been no abdominal pain.  Again, I think we are dealing with 2 primary malignancies.  His CEA has been elevated.  About 3 weeks ago, the CEA was 256.  I would go ahead and try to repeat this.  All of his vital signs are pretty stable.  Temperature 99.5.  Pulse 47.  Blood pressure 109/84.  Oxygen saturation is 92%.  There is no real change in his overall physical exam.  He does have pulses in the left radial artery.  He does have some coolness in the fingers on the left hand.  His abdomen is soft.  There is no guarding or  rebound tenderness.  There is no abdominal mass.  There is no fluid wave.  Lungs sound clear bilaterally.  Cardiac exam regular rate and rhythm.  I know this is incredibly complicated.  We are dealing with multiple issues.  He still has some biliary obstruction.  This is from the ampullary carcinoma.  He has the colonic mass.  Biopsy came back well-differentiated adenocarcinoma.  I will send molecular markers off on the tumor to see if there is any issues that we might be able to take advantage of that he might be able to tolerate.  He does have the hypercoagulable issues.  He is on heparin.  I know the ICU staff are doing a great job with him.  Again this is incredibly complicated.   [name], MD  John 15:7

## 2022-10-13 NOTE — Progress Notes (Signed)
[place] for heparin Indication: occluded left radial artery; ischemic left hand  No Known Allergies  Patient Measurements: Height: '6\' 1"'$  (185.4 cm) Weight: 64.9 kg (143 lb 1.3 oz) IBW/kg (Calculated) : 79.9 Heparin Dosing Weight: 63 kg  Vital Signs: Temp: 97.8 F (36.6 C) (01/17 0442) Temp Source: Axillary (01/17 0442) BP: 134/100 (01/17 0437) Pulse Rate: 75 (01/17 0437)  Labs: Recent Labs    10/11/22 0304 10/12/22 0318 10/12/22 2041 10/13/22 0442  HGB 10.7* 11.5*  --  13.8  HCT 33.5* 35.9*  --  42.6  PLT 141* 146*  --  157  HEPARINUNFRC  --   --  0.41 0.33  CREATININE 0.89 0.83  --  0.94     Estimated Creatinine Clearance: 70 mL/min (by C-G formula based on SCr of 0.94 mg/dL).   Assessment: Patient is a 67 y.o M with ampullary carcinoma with biliary obstruction (s/p ERCP on 09/17/22 with stent placement) who presented to the ED on 10/27/2022, from his oncologist office visit, after he was found to have persistent hyperbilirubinemia.  He underwent a repeat ERCP on 10/26/2022 that showed esophageal plaques, hiatal hernia, pus in the major papilla, and one partially occluded stent from the biliary tree. Swelling noted in his left hand on 10/05/22. Upper extremity US performed on 10/05/22 showed occluded left radial artery and acute left superficial vein thrombosis.  Pharmacy was orignially consulted to dose heparin on 10/26/2022 for left occluded radial artery. Per Dr. Marin Olp, no bolus for heparin drip d/t low plts. Heparin was stopped 1/12 d/t rectal bleeding.  Pharmacy re-consulted to dose heparin with no bolus 1/16.   - 1/10 arteriography: Sluggish flow in the radial artery. Occlusion from the antecubital fossa to the wrist. Thrombosis of digital arteries in the first, second and third digits - No flow in these digits.  VVS recom amputation. 1/16: pharmacy re-consulted to dose heparin with no bolus  Today, 10/13/2022: Heparin level 0.33-  therapeutic x2 on IV heparin 1200 units/hr CBC: Hg/pltc WNL No bleeding or infusion interruptions reported by RN  Goal of Therapy:  Heparin level 0.3-0.7 units/ml Monitor platelets by anticoagulation protocol: Yes   Plan:  - no bolus per MD orders -continue heparin drip at 1200 units/hr  - Daily heparin level and CBC - monitor for s/sx bleeding  Thank you for allowing pharmacy to be a part of this patient's care.  Netta Cedars, PharmD, BCPS 10/13/2022 5:55 AM

## 2022-10-13 NOTE — TOC Progression Note (Signed)
Transition of Care [facility]) - Progression Note    Patient Details  Name: Dock Baccam MRN: [id number] Date of Birth: 1956-05-04  Transition of [facility]) CM/SW Contact  Leeroy Cha, RN Phone Number: 10/13/2022, 10:19 AM  Clinical Narrative:     Tct-helen yourse-dtg-wcb with decision as to where to place patient.  Expected Discharge Plan: [place] Barriers to Discharge: Continued Medical Work up  Expected Discharge Plan and Services   Discharge Planning Services: CM Consult   Living arrangements for the past 2 months: Apartment                                       Social Determinants of Health (SDOH) Interventions [place]: Food Insecurity Present (09/30/2022)  Housing: Low Risk  (09/30/2022)  Transportation Needs: No Transportation Needs (09/30/2022)  Utilities: Not At Risk (09/30/2022)  Depression (PHQ2-9): Low Risk  (09/22/2022)  Tobacco Use: High Risk (10/11/2022)    Readmission Risk Interventions   Row Labels 10/22/2022    8:27 AM  Readmission Risk Prevention Plan   Section Header. No data exists in this row.   Transportation Screening   Complete  PCP or Specialist Appt within 5-7 Days   Complete  Home Care Screening   Complete  Medication Review (RN CM)   Complete

## 2022-10-13 NOTE — Progress Notes (Signed)
Daily Progress Note   Patient Name: [name] [name]       Date: 10/13/2022 DOB: 02/25/1956  Age: 67 y.o. MRN#: [id number] Attending Physician: Florencia Reasons, MD Primary Care Physician: Terrilyn Saver, NP Admit Date: 10/10/2022 Length of Stay: 14 days  Reason for Consultation/Follow-up: Establishing goals of care  Subjective:   CC: Patient sleeping comfortably when seen today.  Following up regarding complex medical decision making.  Subjective:  Palliative medicine team had seen patient earlier during hospitalization and is being re involved at this time with patient's deterioration in medical status.  Discussed care with hospitalist and bedside RN for medical updates.  Since last seen patient has been found to have 2 cancer diagnoses (colon and ampullary).  Patient has progressively gotten weaker with failure to thrive and is now even requiring support for hypoglycemia.  Oncology, radiation oncology, gastroenterology, and surgery have all evaluated the patient. GI and surgery have stated patient is not a candidate for surgical interventions.  Patient has been evaluated for palliative radiation and will be undergoing staging today.  Oncologist note today stated" do not see that he is a candidate for combined radiation with chemotherapy". Patient's underlying reduced cardiac function has continued to be a concern as well.  Patient has also suffered from occlusion of the radial artery which required anticoagulation though have also had to balance bleeding risk. Noted patient would likely require amputation of multiple fingers.  Presented to bedside to see patient today. Patient was sleeping comfortably so did not awaken. He appears very frail at this time. RN noted delirium continues to be an issue. No family present at bedside during visit.   After seeing patient, able to call patient's daughter/NOK, Arline Asp.  Able to introduce myself and the role of the palliative medicine team.  Bonnita Nasuti was able to  discuss in detail patient's prolonged medical journey and all he is gone through.  She has done a wonderful job keeping track of all of his conditions and the medical concerns regarding them.  Antony Haste knows the plan is for palliative radiation to hopefully "shrink" the tumor.  Again radiation would not be to get rid of the tumor though to hopefully provide more quality time. We also discussed concerns regarding patient's poor nutrition.  Stated that with cancer involvement, it is natural that patients are not hungry like they normally would be.  Bonnita Nasuti did mention her sister brought up "feeding tube" so we discussed the complications that can occur from this and how it would not be a bridge to help patient have more meaningful quality of life time, and so would not recommend.  Encouraged continued oral intake as able though also normalized this as a part of the body shutting down. With permission, able to express concern that should patient's medical status continues to decline, may need to consider transitioning care to focus on patient's comfort and even considering involvement of hospice.  Helen at this time taking things day by day and helpful patient tolerates radiation well.  Open to continued conversations.  Expressed great appreciation for all Bonnita Nasuti has done for her father at this time.  Shelda Pal for allowing me to speak with her today.  Review of Systems  Objective:   Vital Signs:  BP (!) 113/97 (BP Location: Right Arm)   Pulse (!) 51   Temp 98.1 F (36.7 C) (Axillary)   Resp 16   Ht '6\' 1"'$  (1.854 m)   Wt 64.9 kg   SpO2 94%   BMI  18.88 kg/m   Physical Exam: General: NAD, sleeping, cachectic Eyes: No drainage noted HENT: Dry mucous membranes Cardiovascular: Tachycardia noted Respiratory: no increased work of breathing noted, not in respiratory distress Abdomen: not distended Extremities: Muscle wasting present in all extremities Skin: Skin changes from occlusion of the radial  artery Neuro: sleeping  Imaging:  I personally reviewed recent imaging.   Assessment & Plan:   Assessment: Patient is a 67 year old male with a past medical history of nonischemic cardiomyopathy EF 25%, hypertension, and recent diagnosis of ampullary carcinoma with malignant biliary stricture status post stenting on 12/22 who was admitted on 10/10/2022 with persistent hyperbilirubinemia and failure to thrive.  Since admission patient has been evaluated by oncology, GI, and required escalation of care to ICU with PCCM team.  Patient underwent ERCP on 10/08/2022 which found esophageal plaques suspicious for candidiasis, 1 partially occluded stent from the biliary tree was seen in the major papula which was removed, pus was seen in the major papula, another moderate biliary stricture was found in the lower third of the main bile duct which was malignant appearing, and so uncovered metal biliary stent was placed into the common bile duct which resulted in adequate drainage of pus.  After procedure patient was transferred to the ICU for management of septic shock.  PCCM talked with family on 10/07/2022 and patient's CODE STATUS was changed to DNR.  Patient's medical status has continued to deteriorate during hospitalization.  Palliative medicine team consulted to assist with complex medical decision making.   Recommendations/Plan: # Complex medical decision making/goals of care:  - Comfortably when seen today so did not awaken.  Bedside RN noted issues with delirium.  -Spoke with patient's next of kin/daughter, Bonnita Nasuti, as described above in HPI.  Bonnita Nasuti remains hopeful that further interventions such as radiation will allow patient some improvement moving forward.  Did discuss concerns regarding cancer progression and body normally shutting down such as not wanting to eat.  Helen at this time taking things day by day and open to continued conversations.  Bonnita Nasuti continues to be an active advocate for her father's  care and appreciates the open communication from the team.   -  Code Status: DNR  # Symptom management:  -As per primary team  # Psychosocial Support:  -daughter, ex-wife  # Discharge Planning: TBD  Discussed with: hospitalist, bedside RN, daughter  Thank you for allowing the palliative care team to participate in the care Claudean Severance.  Chelsea Aus, DO Palliative Care Provider PMT # [phone]  This provider spent a total of 52 minutes providing patient's care.  Includes review of EMR, discussing care with other staff members involved in patient's medical care, obtaining relevant history and information from patient and/or patient's family, and personal review of imaging and lab work. Greater than 50% of the time was spent counseling and coordinating care related to the above assessment and plan.

## 2022-10-13 NOTE — TOC Progression Note (Addendum)
Transition of Care [facility]) - Progression Note    Patient Details  Name: [name] [name] MRN: [id number] Date of Birth: 1956/01/30  [facility]) CM/SW Contact  Leeroy Cha, RN Phone Number: 10/13/2022, 11:01 AM  Clinical Narrative:    Tcf-dtg-helen would like to use [facility].  Tct kathy campbell at [facility]-message left on text /wcb Per Dr Erlinda Hong patient is not ready for snf  Expected Discharge Plan: [place] Barriers to Discharge: Continued Medical Work up  Expected Discharge Plan and Services   Discharge Planning Services: CM Consult   Living arrangements for the past 2 months: Apartment                                       Social Determinants of Health (SDOH) Interventions [place]: Food Insecurity Present (09/30/2022)  Housing: Low Risk  (09/30/2022)  Transportation Needs: No Transportation Needs (09/30/2022)  Utilities: Not At Risk (09/30/2022)  Depression (PHQ2-9): Low Risk  (09/22/2022)  Tobacco Use: High Risk (10/11/2022)    Readmission Risk Interventions   Row Labels 10/05/2022    8:27 AM  Readmission Risk Prevention Plan   Section Header. No data exists in this row.   Transportation Screening   Complete  PCP or Specialist Appt within 5-7 Days   Complete  Home Care Screening   Complete  Medication Review (RN CM)   Complete

## 2022-10-13 NOTE — Progress Notes (Signed)
[place] for heparin >> enoxaparin Indication: occluded left radial artery; ischemic left hand  No Known Allergies  Patient Measurements: Height: '6\' 1"'$  (185.4 cm) Weight: 64.9 kg (143 lb 1.3 oz) IBW/kg (Calculated) : 79.9 Heparin Dosing Weight: 63 kg  Vital Signs: Temp: 98.1 F (36.7 C) (01/17 1132) Temp Source: Axillary (01/17 1132) BP: 85/70 (01/17 1220) Pulse Rate: 51 (01/17 0920)  Labs: Recent Labs    10/11/22 0304 10/12/22 0318 10/12/22 2041 10/13/22 0442  HGB 10.7* 11.5*  --  13.8  HCT 33.5* 35.9*  --  42.6  PLT 141* 146*  --  157  HEPARINUNFRC  --   --  0.41 0.33  CREATININE 0.89 0.83  --  0.94     Estimated Creatinine Clearance: 70 mL/min (by C-G formula based on SCr of 0.94 mg/dL).   Assessment: Patient is a 67 y.o M with ampullary carcinoma with biliary obstruction (s/p ERCP on 09/17/22 with stent placement) who presented to the ED on 10/21/2022, from his oncologist office visit, after he was found to have persistent hyperbilirubinemia.  He underwent a repeat ERCP on 10/12/2022 that showed esophageal plaques, hiatal hernia, pus in the major papilla, and one partially occluded stent from the biliary tree. Swelling noted in his left hand on 10/05/22. Upper extremity US performed on 10/05/22 showed occluded left radial artery and acute left superficial vein thrombosis.  Pharmacy was orignially consulted to dose heparin on 09/28/2022 for left occluded radial artery. Per Dr. Marin Olp, no bolus for heparin drip d/t low plts. Heparin was stopped 1/12 d/t rectal bleeding.  Pharmacy re-consulted to dose heparin with no bolus 1/16, and now to transition anticoagulation to therapeutic enoxaparin on 1/17.   Today, 10/13/2022: CBC: WNL SCr stable, WNL. CrCl ~70 mL/min Heparin has been therapeutic on 1200 units/hr  Goal of Therapy:  Heparin level 0.3-0.7 units/ml Monitor platelets by anticoagulation protocol: Yes   Plan:  Will transition from IV  UFH to therapeutic LMWH tonight at standard medication administration time to avoid middle of the night dosing Stop heparin at the time enoxaparin given Enoxaparin 60 mg (~1 mg/kg) subQ q12h Recheck CBC with AM labs tomorrow Monitor for signs of bleeding  Lenis Noon, PharmD 10/13/22 12:50 PM

## 2022-10-13 NOTE — Consult Note (Signed)
Radiation Oncology         [phone] ________________________________  Name: [name] [name]        MRN: [id number]  Date of Service: 10/13/22 DOB: November 22, 1955  OZ:HYQM, Purcell Nails, NP     REFERRING PHYSICIAN: Dr. Marin Olp   DIAGNOSIS: The primary encounter diagnosis was Failure to thrive in adult. Diagnoses of Hyperbilirubinemia, Hypokalemia, 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HCC), Common bile duct (CBD) stricture, Abnormal CT scan, colon, Severe protein-calorie malnutrition ([place]), and Severe sepsis with septic shock [facility]) were also pertinent to this visit.   HISTORY OF PRESENT ILLNESS: [name] [name] is a 67 y.o. male seen at the request of Dr. 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 His biliary tree was stented with a plastic stent in December 2023. Cytology at that time was consistent with adenocarcinoma of the biliary duct brushing. He had repeat ERCP and exchange for a metal stent by Dr. Rush Landmark on 10/23/2022. Brushings of plaques of the esophagus were negative for malignancy.   He his other comorbidities including cardiomyopathy with a baseline EF of 25%, and has been admitted since 10/21/2022 due to persistent hyperbilirubinemia and as above repeat ERCP was performed he was treated with antibiotics due to biliary sepsis. He developed LUE swelling and doppler on 10/05/22 showed an arterial obstruction of the left radial artery. He was started on anticoagulation and developed rectal bleeding. A sigmoidoscopy on 09/27/2022 that showed a large 4 cm non obstructing circumferential mass in the sigmoid colon that was friable with ulceration. Biopsies showed adenocarcinoma. As he is not a surgical candidate for diversion and not a candidate for systemic therapy, we've been asked to consider a palliative course of radiation.    PREVIOUS RADIATION THERAPY: No   PAST MEDICAL HISTORY:  Past Medical History:  Diagnosis Date    AICD (automatic cardioverter/defibrillator) present    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[place]) 57/84/6962   Chronic systolic CHF (congestive heart failure) ([place]) 01/31/2015   Coronary artery disease    Hypertension    Nonischemic cardiomyopathy ([place]) 06/30/2015       PAST SURGICAL HISTORY: Past Surgical History:  Procedure Laterality Date   AORTIC ARCH ANGIOGRAPHY N/A 10/07/2022   Procedure: AORTIC ARCH ANGIOGRAPHY;  Surgeon: Broadus John, MD;  Location: [place] CV LAB;  Service: Cardiovascular;  Laterality: N/A;   BILIARY BRUSHING  09/17/2022   Procedure: BILIARY BRUSHING;  Surgeon: Jackquline Denmark, MD;  Location: [facility] ENDOSCOPY;  Service: Gastroenterology;;   BILIARY STENT PLACEMENT  09/17/2022   Procedure: BILIARY STENT PLACEMENT;  Surgeon: Jackquline Denmark, MD;  Location: [place];  Service: Gastroenterology;;   BILIARY STENT PLACEMENT N/A 10/26/2022   Procedure: BILIARY STENT PLACEMENT;  Surgeon: Irving Copas., MD;  Location: [facility] ENDOSCOPY;  Service: Gastroenterology;  Laterality: N/A;   BIOPSY  10/18/2022   Procedure: BIOPSY;  Surgeon: Carol Ada, MD;  Location: [facility] ENDOSCOPY;  Service: Gastroenterology;;   CARDIAC CATHETERIZATION N/A 02/04/2015   Procedure: Right/Left Heart Cath and Coronary Angiography;  Surgeon: Larey Dresser, MD;  Location: [place] CV LAB;  Service: Cardiovascular;  Laterality: N/A;   CARDIAC DEFIBRILLATOR PLACEMENT  07/09/2015   CHALAZION EXCISION Left 01/16/2014   Procedure: MINOR EXCISION OF CHALAZION;  Surgeon: Myrtha Mantis., MD;  Location: [place];  Service: Ophthalmology;  Laterality: Left;   EP IMPLANTABLE DEVICE N/A 07/09/2015   Procedure: ICD Implant;  Surgeon: Deboraha Sprang, MD;  Location: [place] CV LAB;  Service: Cardiovascular;  Laterality: N/A;   ERCP N/A 09/17/2022   Procedure: ENDOSCOPIC RETROGRADE CHOLANGIOPANCREATOGRAPHY (ERCP);  Surgeon: Jackquline Denmark, MD;  Location: [facility] ENDOSCOPY;  Service:  Gastroenterology;  Laterality: N/A;   ERCP N/A 10/05/2022   Procedure: ENDOSCOPIC RETROGRADE CHOLANGIOPANCREATOGRAPHY (ERCP);  Surgeon: Irving Copas., MD;  Location: [facility] ENDOSCOPY;  Service: Gastroenterology;  Laterality: N/A;   ESOPHAGEAL BRUSHING  10/13/2022   Procedure: ESOPHAGEAL BRUSHING;  Surgeon: Rush Landmark Telford Nab., MD;  Location: [facility] ENDOSCOPY;  Service: Gastroenterology;;   EYE SURGERY Left ~ 2013   "eye infection"   FLEXIBLE SIGMOIDOSCOPY N/A 10/03/2022   Procedure: FLEXIBLE SIGMOIDOSCOPY;  Surgeon: Carol Ada, MD;  Location: [facility] ENDOSCOPY;  Service: Gastroenterology;  Laterality: N/A;   REMOVAL OF STONES  10/21/2022   Procedure: REMOVAL OF STONES;  Surgeon: Rush Landmark Telford Nab., MD;  Location: [facility] ENDOSCOPY;  Service: Gastroenterology;;   Joan Mayans  09/17/2022   Procedure: Joan Mayans;  Surgeon: Jackquline Denmark, MD;  Location: [facility] ENDOSCOPY;  Service: Gastroenterology;;   Lavell Islam REMOVAL  10/14/2022   Procedure: STENT REMOVAL;  Surgeon: Irving Copas., MD;  Location: [facility] ENDOSCOPY;  Service: Gastroenterology;;   UPPER EXTREMITY ANGIOGRAPHY Left 10/22/2022   Procedure: Upper Extremity Angiography;  Surgeon: Broadus John, MD;  Location: [place] CV LAB;  Service: Cardiovascular;  Laterality: Left;     FAMILY HISTORY:  Family History  Problem Relation Age of Onset   Stroke Mother    Heart Problems Other      SOCIAL HISTORY:  reports that he has been smoking cigarettes. He has a 40.00 pack-year smoking history. He has never used smokeless tobacco. He reports that he does not currently use alcohol. He reports that he does not currently use drugs after having used the following drugs: "Crack" cocaine and Marijuana. The patient is single and lives in [place]. He has two adult daughters who check in on him. His daughter Bonnita Nasuti was able to join [place] by phone.   ALLERGIES: Patient has no known allergies.   MEDICATIONS:  Current Facility-Administered  Medications  Medication Dose Route Frequency Provider Last Rate Last Admin   0.9 %  sodium chloride infusion  250 mL Intravenous PRN Broadus John, MD   Stopped at 10/12/22 0408   acetaminophen (TYLENOL) tablet 650 mg  650 mg Oral Q4H PRN Broadus John, MD   650 mg at 10/12/22 0123   alum & mag hydroxide-simeth (MAALOX/MYLANTA) 200-200-20 MG/5ML suspension 15 mL  15 mL Oral Q6H PRN Raiford Noble Latif, DO   15 mL at 10/12/22 1806   Chlorhexidine Gluconate Cloth 2 % PADS 6 each  6 each Topical Daily Mannam, Praveen, MD   6 each at 10/12/22 1436   digoxin (LANOXIN) tablet 0.125 mg  0.125 mg Oral Daily Zada Finders R, MD   0.125 mg at 10/12/22 4098   feeding supplement (BOOST / RESOURCE BREEZE) liquid 1 Container  1 Container Oral TID BM Esterwood, Amy S, PA-C   1 Container at 10/12/22 2028   fluconazole (DIFLUCAN) tablet 200 mg  200 mg Oral Daily Eudelia Bunch, RPH   200 mg at 10/12/22 0902   guaiFENesin-dextromethorphan (ROBITUSSIN DM) 100-10 MG/5ML syrup 5 mL  5 mL Oral Q4H PRN Raiford Noble Latif, DO   5 mL at 09/28/2022 1527   heparin ADULT infusion 100 units/mL (25000 units/269m)  1,200 Units/hr Intravenous Continuous BEudelia Bunch RPH 12 mL/hr at 10/13/22 0300 1,200 Units/hr at 10/13/22 0300   hydrALAZINE (APRESOLINE) injection 5 mg  5 mg Intravenous Q20 Min PRN  Broadus John, MD       HYDROmorphone (DILAUDID) injection 1 mg  1 mg Intravenous Q2H PRN Volanda Napoleon, MD   1 mg at 10/13/22 6269   insulin aspart (novoLOG) injection 0-9 Units  0-9 Units Subcutaneous TID AC & HS Kathryne Eriksson, NP       labetalol (NORMODYNE) injection 10 mg  10 mg Intravenous Q10 min PRN Broadus John, MD   10 mg at 10/12/22 2218   multivitamin with minerals tablet 1 tablet  1 tablet Oral Daily Raiford Noble [place], DO   1 tablet at 10/12/22 0903   ondansetron (ZOFRAN) injection 4 mg  4 mg Intravenous Q6H PRN Broadus John, MD   4 mg at 10/08/2022 1657   Oral care mouth rinse  15 mL Mouth Rinse  PRN Maryjane Hurter, MD       sodium chloride flush (NS) 0.9 % injection 3 mL  3 mL Intravenous Q12H Lenore Cordia, MD   3 mL at 10/11/22 2141     REVIEW OF SYSTEMS: On review of systems, the patient reports that he has had loose stools, and pain and swelling of his left hand. He's quite tired. He has not had any additional bleeding per rectum since last week. No other complaints are verbalized.     PHYSICAL EXAM:  Wt Readings from Last 3 Encounters:  10/13/22 143 lb 1.3 oz (64.9 kg)  09/28/2022 128 lb 6.4 oz (58.2 kg)  09/22/22 137 lb 6.4 oz (62.3 kg)   Temp Readings from Last 3 Encounters:  10/13/22 97.8 F (36.6 C) (Axillary)  10/08/2022 98 F (36.7 C) (Oral)  09/22/22 98.1 F (36.7 C)   BP Readings from Last 3 Encounters:  10/13/22 136/72  10/27/2022 106/75  09/22/22 111/64   Pulse Readings from Last 3 Encounters:  10/13/22 (!) 59  09/30/2022 94  09/22/22 82   Pain Assessment Pain Score: 2 /10  In general this is a chronically ill appearing African American male in no acute distress. He's alert and oriented x4 and appropriate throughout the examination. Cardiopulmonary assessment is negative for acute distress and he exhibits normal effort.     ECOG = 0  0 - Asymptomatic (Fully active, able to carry on all predisease activities without restriction)  1 - Symptomatic but completely ambulatory (Restricted in physically strenuous activity but ambulatory and able to carry out work of a light or sedentary nature. For example, light housework, office work)  2 - Symptomatic, <50% in bed during the day (Ambulatory and capable of all self care but unable to carry out any work activities. Up and about more than 50% of waking hours)  3 - Symptomatic, >50% in bed, but not bedbound (Capable of only limited self-care, confined to bed or chair 50% or more of waking hours)  4 - Bedbound (Completely disabled. Cannot carry on any self-care. Totally confined to bed or chair)  5 -  Death   Eustace Pen MM, Creech RH, Tormey DC, et al. [phone]). "Toxicity and response criteria of the [facility]". Naomi Oncol. 5 (6): [phone]    LABORATORY DATA:  Lab Results  Component Value Date   WBC 21.9 (H) 10/13/2022   HGB 13.8 10/13/2022   HCT 42.6 10/13/2022   MCV 101.2 (H) 10/13/2022   PLT 157 10/13/2022   Lab Results  Component Value Date   NA 138 10/13/2022   K 5.0 10/13/2022   CL 109 10/13/2022   CO2  20 (L) 10/13/2022   Lab Results  Component Value Date   ALT 18 10/13/2022   AST 46 (H) 10/13/2022   ALKPHOS 243 (H) 10/13/2022   BILITOT 5.1 (H) 10/13/2022      RADIOGRAPHY: PERIPHERAL VASCULAR CATHETERIZATION  Result Date: 10/26/2022 Images from the original result were not included.   Patient name: [name] [name] MRN: [id number]        DOB: Mar 28, 1956        Sex: male  10/24/2022 Pre-operative Diagnosis: Left upper extremity hand ischemia status post left radial arterial line Post-operative diagnosis:  Same Surgeon:  Broadus John, MD Procedure Performed: 1.  Ultrasound-guided micropuncture access of the right common femoral artery in retrograde fashion 2.  Arch angiogram 3.  Third order cannulation, left upper extremity angiogram 4.  Moderate sedation 43 minutes 5.  Contrast volume 150 mL 6.  Device assisted closure-Mynx   Indications: This is a 67 y.o. male with what appears to be an ischemic left hand status post radial artery cannulation for hemodynamic monitoring now with occluded radial artery by recent duplex and very weak flow into the hand by ulnar artery.  This is being ongoing for a few days at this point.  Unfortunately patient with decreasing platelets per primary team not a candidate for anticoagulation at this time.  Will plan for angiography from femoral approach tomorrow in the Cath Lab at [facility].  He will be scheduled for the last case the day and will need to be transferred over for this procedure and can be sent back afterwards  as long as he is stable for transfer.  I discussed with the patient and his daughter at the bedside that he is at high risk of digit loss with or without revascularization possibly could lose more than just his digits up to and including his hand.  Will likely require hand surgery involvement in his case.    Findings: Arch angiogram demonstrates normal first-order vessels without stenosis. Left upper extremity: High bifurcation of the radial artery from the proximal brachial artery.  Sluggish flow in the radial artery to the level of the antecubital fossa where ends in collaterals.  The brachial artery continues giving off the ulnar and interosseous arteries. These demonstrate normal flow.  The interosseous artery reconstitutes the radial artery at the level of the wrist.  The palmar arch is intact with digital arteries of the fourth and fifth digits.  There are no branches from the palmar arch to the first second and third digits.              Procedure:  The patient was identified in the holding area and taken to room 8.  The patient was then placed supine on the table and prepped and draped in the usual sterile fashion.  A time out was called.  Ultrasound was used to evaluate the right common femoral artery.  It was patent .  A digital ultrasound image was acquired.  A micropuncture needle was used to access the right common femoral artery under ultrasound guidance.  An 018 wire was advanced without resistance and a micropuncture sheath was placed.  The 018 wire was removed and a benson wire was placed.  The micropuncture sheath was exchanged for a 5 french sheath.  The patient was heparinized, and a pigtail flush catheter was placed in the ascending aorta for arch angiogram.  See results above.  Next, a Glidewire advantage wire was used to select the left subclavian artery.  The wire  was exchanged for a cathter that was parked in the left axillary artery.  See results above.  In an effort to assess whether  recannulization of the radial artery would improve distal flow, a wire was advanced through the radial artery into the distal patent portion located at the wrist.  Follow-up angiography demonstrated no improvement hand fifth digit perfusion.  Being that perfusion was unchanged with angiography from the distal radial artery elected to end the case.  Arteriotomy was managed with a minx device.  Impression: High bifurcation of the radial artery with occlusion from the antecubital fossa to the wrist.  There is reconstitution of the radial artery via the interosseous artery.  In the hand, thrombosis of digital arteries in the first, second and third digits.  No flow in these digits. Palmar arch intact.  Recommend hand surgery involvement as patient will require future amputation.   Cassandria Santee, MD [facility] Office: [phone]   VAS [place] UPPER EXTREMITY VENOUS DUPLEX  Result Date: 10/05/2022 UPPER VENOUS STUDY  Patient Name:  [name] [name]  Date of Exam:   10/05/2022 Medical Rec #: [id number]       Accession #:    [id number] Date of Birth: [birth year]      Patient Gender: M Patient Age:   75 years Exam Location:  [facility] Procedure:      VAS [place] UPPER EXTREMITY VENOUS DUPLEX Referring Phys: Burney Gauze --------------------------------------------------------------------------------  Indications: Swelling Risk Factors: Cancer. Limitations: Body habitus, poor ultrasound/tissue interface and patient positioning. Comparison Study: No prior studies. Performing Technologist: Oliver Hum RVT  Examination Guidelines: A complete evaluation includes B-mode imaging, spectral Doppler, color Doppler, and power Doppler as needed of all accessible portions of each vessel. Bilateral testing is considered an integral part of a complete examination. Limited examinations for reoccurring indications may be performed as noted.  Right Findings:  +----------+------------+---------+-----------+----------+-------+ RIGHT     CompressiblePhasicitySpontaneousPropertiesSummary +----------+------------+---------+-----------+----------+-------+ Subclavian    Full       Yes       Yes                      +----------+------------+---------+-----------+----------+-------+  Left Findings: +----------+------------+---------+-----------+----------+-------+ LEFT      CompressiblePhasicitySpontaneousPropertiesSummary +----------+------------+---------+-----------+----------+-------+ IJV           Full       Yes       Yes                      +----------+------------+---------+-----------+----------+-------+ Subclavian    Full       Yes       Yes                      +----------+------------+---------+-----------+----------+-------+ Axillary      Full       Yes       Yes                      +----------+------------+---------+-----------+----------+-------+ Brachial      Full       Yes       Yes                      +----------+------------+---------+-----------+----------+-------+ Radial        Full                                          +----------+------------+---------+-----------+----------+-------+  Ulnar         Full                                          +----------+------------+---------+-----------+----------+-------+ Cephalic      Full                                          +----------+------------+---------+-----------+----------+-------+ Basilic       None                                   Acute  +----------+------------+---------+-----------+----------+-------+  Summary:  Right: No evidence of thrombosis in the subclavian.  Left: No evidence of deep vein thrombosis in the upper extremity. Findings consistent with acute superficial vein thrombosis involving the left basilic vein.  *See table(s) above for measurements and observations.  Diagnosing physician: Servando Snare MD  Electronically signed by Servando Snare MD on 10/05/2022 at 3:05:52 PM.    Final    VAS [place] UPPER EXTREMITY ARTERIAL DUPLEX  Result Date: 10/05/2022  UPPER EXTREMITY DUPLEX STUDY Patient Name:  [name] [name]  Date of Exam:   10/05/2022 Medical Rec #: [id number]       Accession #:    [id number] Date of Birth: [birth year]      Patient Gender: M Patient Age:   50 years Exam [facility]:  [facility] Procedure:      VAS [place] UPPER EXTREMITY ARTERIAL DUPLEX Referring Phys: EMILY AVENTURA --------------------------------------------------------------------------------  Indications: Assymetrical blood pressures.  Risk Factors: Hypertension. Limitations: Body habitus, poor ultrasound/tissue interface and patient              positioning. Comparison Study: No prior studies. Performing Technologist: Oliver Hum RVT  Examination Guidelines: A complete evaluation includes B-mode imaging, spectral Doppler, color Doppler, and power Doppler as needed of all accessible portions of each vessel. Bilateral testing is considered an integral part of a complete examination. Limited examinations for reoccurring indications may be performed as noted.  Left Doppler Findings: +--------------+----------+---------+--------+--------+ Site          PSV (cm/s)Waveform StenosisComments +--------------+----------+---------+--------+--------+ Subclavian Mid54        triphasic                 +--------------+----------+---------+--------+--------+ Axillary      37        triphasic                 +--------------+----------+---------+--------+--------+ Brachial Prox 39        biphasic                  +--------------+----------+---------+--------+--------+ Radial Prox   0                  occluded         +--------------+----------+---------+--------+--------+ Radial Mid    0                  occluded         +--------------+----------+---------+--------+--------+ Radial Dist   0                  occluded          +--------------+----------+---------+--------+--------+ Ulnar Prox    46  biphasic                  +--------------+----------+---------+--------+--------+ Ulnar Mid     36        biphasic                  +--------------+----------+---------+--------+--------+ Ulnar Dist    32        biphasic                  +--------------+----------+---------+--------+--------+ The radial and ulnar arteries bifurcate in the proximal upper arm.  Summary:  Left: Obstruction noted in the radial artery. *See table(s) above for measurements and observations. Electronically signed by Servando Snare MD on 10/05/2022 at 3:05:43 PM.    Final    DG Chest Port 1 View  Result Date: 10/03/2022 CLINICAL DATA:  Acute respiratory failure EXAM: PORTABLE CHEST 1 VIEW COMPARISON:  March 03, 2023 FINDINGS: An ETT terminates in the trachea. An NG tube terminates below today's film. A right central line terminates in the SVC. Stable AICD device. No pneumothorax. The lungs are clear. The cardiomediastinal silhouette is stable. IMPRESSION: 1. Support apparatus as above. 2. No acute abnormalities. Electronically Signed   By: Dorise Bullion III M.D.   On: 10/03/2022 10:48   DG Abd 1 View  Result Date: 09/30/2022 CLINICAL DATA:  OG tube placement. EXAM: ABDOMEN - 1 VIEW COMPARISON:  10/17/2022. FINDINGS: Borderline distended loop of small bowel in the mid left abdomen measuring 3.1 cm. An enteric tube terminates in the stomach. Common bile duct stent is unchanged. Vascular calcifications are noted in the abdomen. IMPRESSION: Enteric tube is appropriate position. Borderline distended loop of small bowel in the abdomen, possible ileus versus partial or early obstruction. Electronically Signed   By: Brett Fairy M.D.   On: 10/17/2022 22:52   VAS [place] LOWER EXTREMITY VENOUS (DVT)  Result Date: 10/14/2022  Lower Venous DVT Study Patient Name:  [name] [name]  Date of Exam:   10/12/2022 Medical Rec #: [id number]       Accession #:     [id number] Date of Birth: 11/30/1955      Patient Gender: M Patient Age:   65 years Exam Location:  [facility] Procedure:      VAS [place] LOWER EXTREMITY VENOUS (DVT) Referring Phys: Annamaria Boots XU --------------------------------------------------------------------------------  Indications: Edema.  Risk Factors: Cancer. Limitations: Poor ultrasound/tissue interface, body habitus and patient positioning. Comparison Study: No prior studies. Performing Technologist: Oliver Hum RVT  Examination Guidelines: A complete evaluation includes B-mode imaging, spectral Doppler, color Doppler, and power Doppler as needed of all accessible portions of each vessel. Bilateral testing is considered an integral part of a complete examination. Limited examinations for reoccurring indications may be performed as noted. The reflux portion of the exam is performed with the patient in reverse Trendelenburg.  +-----+---------------+---------+-----------+----------+--------------+ RIGHTCompressibilityPhasicitySpontaneityPropertiesThrombus Aging +-----+---------------+---------+-----------+----------+--------------+ CFV  Full           Yes      Yes                                 +-----+---------------+---------+-----------+----------+--------------+   +---------+---------------+---------+-----------+----------+--------------+ LEFT     CompressibilityPhasicitySpontaneityPropertiesThrombus Aging +---------+---------------+---------+-----------+----------+--------------+ CFV      Full           Yes      Yes                                 +---------+---------------+---------+-----------+----------+--------------+  SFJ      Full                                                        +---------+---------------+---------+-----------+----------+--------------+ FV Prox  Full                                                         +---------+---------------+---------+-----------+----------+--------------+ FV Mid   Full                                                        +---------+---------------+---------+-----------+----------+--------------+ FV DistalFull                                                        +---------+---------------+---------+-----------+----------+--------------+ PFV      Full                                                        +---------+---------------+---------+-----------+----------+--------------+ POP      Full           Yes      Yes                                 +---------+---------------+---------+-----------+----------+--------------+ PTV      Full                                                        +---------+---------------+---------+-----------+----------+--------------+ PERO     Full                                                        +---------+---------------+---------+-----------+----------+--------------+    Summary: RIGHT: - No evidence of common femoral vein obstruction.  LEFT: - There is no evidence of deep vein thrombosis in the lower extremity.  - No cystic structure found in the popliteal fossa.  *See table(s) above for measurements and observations. Electronically signed by Orlie Pollen on 10/11/2022 at 1:19:12 PM.    Final    DG Chest Port 1 View  Result Date: 09/30/2022 CLINICAL DATA:  Acute respiratory failure EXAM: PORTABLE CHEST 1 VIEW COMPARISON:  10/23/2022 FINDINGS: There is a ET tube with tip above the carina. Right IJ catheter tip terminates at the cavoatrial junction. Left chest wall ICD is noted  with lead in the right ventricle. Stable cardiomediastinal contours. No pleural effusion or edema. No airspace opacities identified. IMPRESSION: 1. Stable support apparatus. 2. No acute cardiopulmonary abnormalities. Electronically Signed   By: Kerby Moors M.D.   On: 10/05/2022 09:40   DG Abd 1 View  Result Date:  09/27/2022 CLINICAL DATA:  Evaluate enteric tube placement. EXAM: ABDOMEN - 1 VIEW COMPARISON:  CT 09/15/2022 FINDINGS: Interval placement of enteric tube with tip and side port in the gastric fundus below the level of the GE junction. Common bile duct stent is identified. There is diffuse gaseous distension of the small bowel loops which measure up to 3 cm. IMPRESSION: 1. Interval placement of enteric tube with tip and side port in the gastric fundus. 2. Diffuse gaseous distension of the small bowel loops. Which may reflect small bowel ileus or obstruction. Electronically Signed   By: Kerby Moors M.D.   On: 09/28/2022 20:21   ECHOCARDIOGRAM COMPLETE  Result Date: 10/08/2022    ECHOCARDIOGRAM REPORT   Patient Name:   [name] [name] Date of Exam: 10/08/2022 Medical Rec #:  [id number]      Height:       73.0 in Accession #:    [id number]     Weight:       137.9 lb Date of Birth:  [birth year]     BSA:          1.837 m Patient Age:    43 years       BP:           134/63 mmHg Patient Gender: M              HR:           88 bpm. Exam Location:  Inpatient Procedure: 2D Echo and Intracardiac Opacification Agent Indications:    cardiomyopathy  History:        Patient has prior history of Echocardiogram examinations, most                 recent 10/30/2021. Cardiomyopathy and CHF; Risk                 Factors:Hypertension and Current Smoker.  Sonographer:    Harvie Junior Referring Phys: [place]  1. Left ventricular ejection fraction, by estimation, is <20%. The left ventricle has severely decreased function. The left ventricle demonstrates global hypokinesis. The left ventricular internal cavity size was severely dilated. There is mild concentric left ventricular hypertrophy. Left ventricular diastolic parameters are indeterminate.  2. Right ventricular systolic function is severely reduced. The right ventricular size is mildly enlarged. There is normal pulmonary artery systolic pressure.  3. Left  atrial size was mildly dilated.  4. No evidence of mitral valve regurgitation.  5. Tricuspid valve regurgitation is mild to moderate.  6. The aortic valve is tricuspid. Aortic valve regurgitation is not visualized.  7. The inferior vena cava is normal in size with greater than 50% respiratory variability, suggesting right atrial pressure of 3 mmHg. Comparison(s): No significant change from prior study. FINDINGS  Left Ventricle: Left ventricular ejection fraction, by estimation, is <20%. The left ventricle has severely decreased function. The left ventricle demonstrates global hypokinesis. Definity contrast agent was given IV to delineate the left ventricular endocardial borders. The left ventricular internal cavity size was severely dilated. There is mild concentric left ventricular hypertrophy. Left ventricular diastolic parameters are indeterminate. Right Ventricle: The right ventricular size is mildly enlarged. Right ventricular systolic function is severely  reduced. There is normal pulmonary artery systolic pressure. The tricuspid regurgitant velocity is 2.74 m/s, and with an assumed right atrial pressure of 3 mmHg, the estimated right ventricular systolic pressure is 40.9 mmHg. Left Atrium: Left atrial size was mildly dilated. Right Atrium: Right atrial size was normal in size. Pericardium: There is no evidence of pericardial effusion. Mitral Valve: No evidence of mitral valve regurgitation. Tricuspid Valve: Tricuspid valve regurgitation is mild to moderate. Aortic Valve: The aortic valve is tricuspid. Aortic valve regurgitation is not visualized. Aortic regurgitation PHT measures 445 msec. Aortic valve mean gradient measures 1.0 mmHg. Aortic valve peak gradient measures 2.1 mmHg. Aortic valve area, by VTI measures 3.26 cm. Pulmonic Valve: Pulmonic valve regurgitation is not visualized. Aorta: The aortic root is normal in size and structure. Venous: The inferior vena cava is normal in size with greater than  50% respiratory variability, suggesting right atrial pressure of 3 mmHg. IAS/Shunts: No atrial level shunt detected by color flow Doppler. Additional Comments: A device lead is visualized.  LEFT VENTRICLE PLAX 2D LVIDd:         6.00 cm      Diastology LVIDs:         5.90 cm      LV e' medial:    3.15 cm/s LV PW:         1.10 cm      LV E/e' medial:  21.8 LV IVS:        1.10 cm      LV e' lateral:   5.66 cm/s LVOT diam:     2.50 cm      LV E/e' lateral: 12.1 LV SV:         38 LV SV Index:   21 LVOT Area:     4.91 cm  LV Volumes (MOD) LV vol d, MOD A2C: 224.0 ml LV vol d, MOD A4C: 200.0 ml LV vol s, MOD A2C: 212.0 ml LV vol s, MOD A4C: 183.0 ml LV SV MOD A2C:     12.0 ml LV SV MOD A4C:     200.0 ml LV SV MOD BP:      18.2 ml RIGHT VENTRICLE RV Basal diam:  4.10 cm RV Mid diam:    3.70 cm RV S prime:     9.36 cm/s TAPSE (M-mode): 1.2 cm LEFT ATRIUM           Index        RIGHT ATRIUM           Index LA diam:      3.70 cm 2.01 cm/m   RA Area:     13.10 cm LA Vol (A4C): 57.5 ml 31.30 ml/m  RA Volume:   35.10 ml  19.11 ml/m  AORTIC VALVE                    PULMONIC VALVE AV Area (Vmax):    3.84 cm     PV Vmax:       1.51 m/s AV Area (Vmean):   3.47 cm     PV Peak grad:  9.1 mmHg AV Area (VTI):     3.26 cm AV Vmax:           73.30 cm/s AV Vmean:          48.900 cm/s AV VTI:            0.116 m AV Peak Grad:      2.1 mmHg AV Mean Grad:  1.0 mmHg LVOT Vmax:         57.40 cm/s LVOT Vmean:        34.600 cm/s LVOT VTI:          0.077 m LVOT/AV VTI ratio: 0.66 AI PHT:            445 msec  AORTA Ao Root diam: 3.90 cm MITRAL VALVE               TRICUSPID VALVE MV Area (PHT): 6.12 cm    TR Peak grad:   30.0 mmHg MV Decel Time: 124 msec    TR Vmax:        274.00 cm/s MR Peak grad: 39.4 mmHg MR Vmax:      314.00 cm/s  SHUNTS MV E velocity: 68.60 cm/s  Systemic VTI:  0.08 m MV A velocity: 53.60 cm/s  Systemic Diam: 2.50 cm MV E/A ratio:  1.28 Landscape architect signed by Phineas Inches Signature Date/Time:  09/28/2022/5:32:27 PM    Final    DG CHEST PORT 1 VIEW  Result Date: 10/11/2022 CLINICAL DATA:  Acute respiratory failure EXAM: PORTABLE CHEST 1 VIEW COMPARISON:  09/22/2022 FINDINGS: Single lead pacer is in place. Endotracheal tube tip is just below the thoracic inlet, about 6.5 cm above the carina. Right IJ central line tip: SVC. No pneumothorax. Atherosclerotic calcification of the aortic arch. Mild enlargement of the cardiopericardial silhouette, without overt edema. Low lung volumes are present, causing crowding of the pulmonary vasculature. IMPRESSION: 1. Mild enlargement of the cardiopericardial silhouette, without edema. 2. Low lung volumes are present, causing crowding of the pulmonary vasculature. 3. Endotracheal tube tip is about 6.5 cm above the carina. 4.  Aortic Atherosclerosis (ICD10-I70.0). Electronically Signed   By: Van Clines M.D.   On: 10/16/2022 16:38   DG ERCP  Result Date: 10/23/2022 CLINICAL DATA:  Biliary ductal dilation EXAM: ERCP COMPARISON:  ERCP, 09/17/2022. CT AP, 09/15/2022. US Abdomen, 10/10/2022. FLUOROSCOPY: Exposure Index (as provided by the fluoroscopic device): 33.9 mGy Kerma FINDINGS: Multiple, limited oblique planar images of the RIGHT upper quadrant obtained C-arm. Images demonstrating flexible endoscopy, biliary duct cannulation, sphincterotomy, retrograde cholangiogram and balloon sweep. Intra-and extrahepatic biliary ductal dilatation is present. No discrete filling defect is demonstrated. Removal pre-existing plastic biliary stent and placement of metallic stent. IMPRESSION: Fluoroscopic imaging for ERCP and metallic biliary stent placement. For complete description of intra procedural findings, please see performing service dictation. Electronically Signed   By: Michaelle Birks M.D.   On: 09/30/2022 15:08   US Abdomen Limited RUQ (LIVER/GB)  Result Date: 10/10/2022 CLINICAL DATA:  [id number] Biliary obstruction [id number] EXAM: ULTRASOUND ABDOMEN LIMITED RIGHT UPPER  QUADRANT COMPARISON:  CT abdomen pelvis 09/15/2022, ERCP 09/17/2022 FINDINGS: Gallbladder: Layering gallbladder sludge within the gallbladder lumen. Avascular masslike echogenic foci along the gallbladder wall measuring 2.4 x 3.2 x 2.3 cm. Possible gallbladder wall thickening. No sonographic Murphy sign noted by sonographer. Common bile duct: Diameter: 18 mm. Echogenic debris noted occluding the common bile duct. Echogenic debris also noted occluding the stent. Liver: Interval development of intrahepatic biliary ductal dilatation. No focal lesion identified. Within normal limits in parenchymal echogenicity. Portal vein is patent on color Doppler imaging with normal direction of blood flow towards the liver. Other: Simple free fluid ascites. IMPRESSION: 1. Gallbladder sludge as well as avascular masslike echogenic foci along the gallbladder wall measuring 2.4 x 3.2 x 2.3 cm-Findings suggestive of inspissated gallbladder sludge. Possible gallbladder wall thickening. Negative sonographic Percell Miller sign reported. Correlate clinically for  acute cholecystitis. 2. Interval development of intra and extrahepatic biliary ductal dilatation. Dilated common bile duct. Echogenic debris occluding both the common bile duct and the common bile duct stent. 3. Recommend surgical consultation. 4. Simple free fluid ascites. Electronically Signed   By: Iven Finn M.D.   On: 10/04/2022 20:29   DG Chest 2 View  Result Date: 09/23/2022 CLINICAL DATA:  Recent hospitalization. History of CHF. No new symptoms. EXAM: CHEST - 2 VIEW COMPARISON:  09/07/2019. FINDINGS: Cardiac silhouette normal in size and configuration. Normal mediastinal and hilar contours. Single lead left anterior chest wall AICD is stable in well positioned. Clear lungs.  No pleural effusion or pneumothorax. Skeletal structures are unremarkable. IMPRESSION: No active cardiopulmonary disease. Electronically Signed   By: Lajean Manes M.D.   On: 09/23/2022 12:53   DG  ERCP  Result Date: 09/17/2022 CLINICAL DATA:  Biliary ductal dilation. EXAM: ERCP COMPARISON:  CT AP, 09/15/2022.  US Abdomen, 09/15/2022. FLUOROSCOPY: Exposure Index (as provided by the fluoroscopic device): 28.3 mGy Kerma FINDINGS: Multiple, limited oblique planar images of the RIGHT upper quadrant obtained C-arm. Images demonstrating flexible endoscopy, biliary duct cannulation, sphincterotomy, retrograde cholangiogram and plastic biliary stent placement. Intrahepatic biliary ductal dilation is demonstrated. IMPRESSION: Fluoroscopic imaging for ERCP and plastic biliary stent placement. For complete description of intra procedural findings, please see performing service dictation. Electronically Signed   By: Michaelle Birks M.D.   On: 09/17/2022 17:28   DG C-Arm 1-60 Min-No Report  Result Date: 09/17/2022 Fluoroscopy was utilized by the requesting physician.  No radiographic interpretation.   CT ABDOMEN PELVIS WO CONTRAST  Result Date: 09/15/2022 CLINICAL DATA:  Left-sided abdominal pain, diarrhea EXAM: CT ABDOMEN AND PELVIS WITHOUT CONTRAST TECHNIQUE: Multidetector CT imaging of the abdomen and pelvis was performed following the standard protocol without IV contrast. RADIATION DOSE REDUCTION: This exam was performed according to the departmental dose-optimization program which includes automated exposure control, adjustment of the mA and/or kV according to patient size and/or use of iterative reconstruction technique. COMPARISON:  Abdomen sonogram done earlier today, CT abdomen done on 11/27/2004. FINDINGS: Lower chest: Pacer leads are noted in place. Visualized lower lung fields are clear. Hepatobiliary: There is abnormal dilation of intrahepatic and extrahepatic bile ducts. Distal common bile duct in the head of the pancreas measures 14 mm. Gallbladder is distended. There is no wall thickening in gallbladder. There is no fluid around the gallbladder. Pancreas: No focal abnormalities are seen in  pancreas. Evaluation is limited in this noncontrast study. Spleen: Unremarkable. Adrenals/Urinary Tract: Adrenals are unremarkable. There is no hydronephrosis. Small calcifications are seen in left renal artery branches. There are no demonstrable renal or ureteral stones. Urinary bladder is not distended. Stomach/Bowel: Stomach is not distended. Small bowel loops are not dilated. Appendix is not distinctly seen. There is no pericecal inflammation. There is abnormal wall thickening in sigmoid colon measuring up to 2.4 cm in thickness. Vascular/Lymphatic: There are calcifications in abdominal aorta and its major branches. There is aneurysmal dilation of infrarenal aorta measuring 4.6 x 4.1 cm. Evaluation of lumen is limited in this noncontrast study. There is aneurysmal dilation of right common iliac artery measuring 2.4 cm. There is aneurysmal dilation in left common iliac artery measuring 2.3 cm. There is no demonstrable retroperitoneal hematoma. No significant lymphadenopathy is seen. Reproductive: Prostate is enlarged. Other: There is no ascites or pneumoperitoneum. Umbilical hernia containing fat is seen. Musculoskeletal: Degenerative changes are noted in lumbar spine with disc space narrowing, bony spurs, facet hypertrophy and encroachment  of neural foramina at multiple levels, more so at L4-L5 and L5-S1 levels. IMPRESSION: There is no evidence of intestinal obstruction or pneumoperitoneum. There is no hydronephrosis. There is abnormal dilation of intrahepatic and extrahepatic bile ducts. Distal common bile duct measures 1.4 cm. Evaluation of pancreas in the region of ampulla is limited in this noncontrast study. Findings may be related to noncalcified calculus in the distal common bile duct or stricture in the distal common bile duct. Follow-up MRI and MRCP may be considered. Distention of gallbladder may be due to stricture in distal common bile duct and possibly fasting state of the patient. There is abnormal  wall thickening in sigmoid colon. Possibility of malignant neoplastic process in sigmoid colon is not excluded. Endoscopic correlation should be considered. There is aneurysmal dilation of infrarenal abdominal aorta measuring 4.6 cm in diameter. There is aneurysmal dilation of both common iliac arteries measuring up to 2.4 cm. Recommend follow-up every 6 months and vascular consultation.Reference: J Am Coll Radiol 0086;76:[id number]. Enlarged prostate.  Lumbar spondylosis. Other findings as described in the body of the report. Electronically Signed   By: Elmer Picker M.D.   On: 09/15/2022 21:45   US Abdomen Limited RUQ (LIVER/GB)  Result Date: 09/15/2022 CLINICAL DATA:  Pain right upper quadrant EXAM: ULTRASOUND ABDOMEN LIMITED RIGHT UPPER QUADRANT COMPARISON:  None Available. FINDINGS: Gallbladder: There are no demonstrable gallbladder stones. Technologist did not observe any tenderness over the gallbladder. There are low-level echoes in the lumen suggesting possible sludge. There is no fluid around the gallbladder. Common bile duct: Diameter: Common bile duct measures 1.5 cm. There is dilation of intrahepatic bile ducts. Distal common bile duct is obscured by bowel gas. Liver: There is increased echogenicity in liver. No focal abnormalities are seen in the visualized portions of liver. Portal vein is patent on color Doppler imaging with normal direction of blood flow towards the liver. Other: None. IMPRESSION: Sludge is seen in the lumen of gallbladder. There are no demonstrable gallbladder stones or imaging signs of acute cholecystitis. There is dilation of intrahepatic and visualized proximal extrahepatic bile ducts. Proximal common bile duct measures 1.5 cm in diameter. This may suggest stricture or calculus in the distal common bile duct. Distal common bile duct is obscured by bowel gas and not evaluated. If clinically warranted, follow-up CT may be considered. Electronically Signed   By: Elmer Picker M.D.   On: 09/15/2022 18:28       IMPRESSION/PLAN: 1. Adenocarcinoma of the Sigmoid Colon. Dr. Lisbeth Renshaw has reviewed his case and workup to date. Unfortunately his other comorbidities do not allow for surgical intervention, and at this time his tumor is clinically causing bleeding in the setting of needing anticoagulation for his other comorbidities. We have been asked to consider palliative radiotherapy which Dr. Lisbeth Renshaw believes would be helpful for hemostasis and to hopefully reduce tumor size and reduce risks of large bowel obstruction. We discussed the risks, benefits, short, and long term effects of radiotherapy, as well as the palliative intent, and the patient is interested in proceeding. I discussed the delivery and logistics of radiotherapy and anticipates a course of 2 weeks of radiotherapy to the pelvis. Written consent is obtained and placed in the chart, a copy was provided to the patient. He will simulate this afternoon and we will begin his treatment tomorrow. He can continue the completion of his 10 total fractions of radiation as an outpatient whenever the primary team feels he is able to discharge. 2. Ampullary Carcinoma. The patient  has undergone stenting and his biliary system seems to be stable at this point with this intervention. Additional discussion of treatment will be to Dr. Marin Olp once he is stabilized as an outpatient. 3. Vascular occlusive disease. The patient will be followed by vascular surgery and we will follow their recommendations and his course otherwise expectantly.   In a visit lasting 60 minutes, greater than 50% of the time was spent face to face discussing the patient's condition, in preparation for the discussion, and coordinating the patient's care.   The above documentation reflects my direct findings during this shared patient visit. Please see the separate note by Dr. Lisbeth Renshaw on this date for the remainder of the patient's plan of care.    Carola Rhine, [facility]   **Disclaimer: This note was dictated with voice recognition software. Similar sounding words can inadvertently be transcribed and this note may contain transcription errors which may not have been corrected upon publication of note.**

## 2022-10-13 NOTE — Progress Notes (Signed)
PROGRESS NOTE    [name] [name]  GTX:[id number] DOB: March 21, 1956 DOA: 09/28/2022 PCP: Terrilyn Saver, NP     Brief Narrative:  [name] [name] is a 67 y.o. 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[place]) Active Problems:   Hypokalemia   ICD (implantable cardioverter-defibrillator) in place   Biliary obstruction due to malignant neoplasm (HCC)   Chronic systolic CHF (congestive heart failure) (HCC)   Hematochezia   Nonischemic cardiomyopathy (HCC)   Abnormal CT scan, colon   Secondary malignancy of biliary tract (HCC)   Elevated CEA   Abnormal LFTs   Severe sepsis with septic shock ([place])   Palliative care encounter   Failure to thrive in adult   Severe protein-calorie malnutrition (HCC)   HFrEF (heart failure with reduced ejection fraction) (HCC)   Goals of care, counseling/discussion   Counseling and coordination of care   Common bile duct (CBD) stricture   Chronic diarrhea   Esophageal candidiasis (HCC)   Cholangiocarcinoma ([place])    Assessment and Plan:  Colon cancer , (biopsy from  flex sig by Dr Benson Norway on 1/13), seen by gen surg, thought not a candidate for surgery Ampullary cancer, was admitted to the ICU in septic shock due to cholangitis , s/p ERCP and biliary stenting on 10/01/21  Seen by  oncology Dr Marin Olp who recommended XRT per Dr Marin Olp , he is not  a candidate for combined radiation with chemotherapy Appreciate oncology and rad onc input  Septic shock from cholangitis [place] Bacteremia  Septic shock resolved, Treated with zosyn, flagyl then  ancef, last dose on 1/12 Acute respiratory failure required intubation initially, now on 2liter oxygen  Esophageal Candidiasis -Diffuse, yellow plaques were found in the entire esophagus seen on ERCP -C/w Fluconazole 1/8 and has a stop date of 10/17/2022  Persistent leukocytosis -Cxr on 1/17 showed "New left lower lobe consolidation or atelectasis." He has no cough, denies sob or chest pain -Oncology ordered urine culture and blood culture on 1/17   Hypoglycemia Likely from poor oral intake, start d51/2saline No plan for surgery currently, change heparin drip to subQ lovenox  AFib Chronic systolic CHF (EF 82%) CAD On coreg/dig/imdur/crestor/zetia/asa '81mg'$  /farxiga at home He is in afib , heart rate around 110-120, Currently on dig only, restart low dose lopressor  ,monitor bp and heart rate Heparin drip transition to lovenox Has borderline bp, does not appear grossly volume overloaded, suspect intravacullary dry with some third spacing on lower extremity Will give ivf bolus, consider midodrine for bp support if bp remains borderline after fluids bolus Close monitor volume status    PAD/occlusion of the radial artery from the level of the AC fossa to the level of the wrist.  -an A-line was placed in the left radial artery initially in the ICU -He was evaluated by vascular surgery on 10/05/21 and underwent angiogram  on 1/10. The study noted occlusion of the radial artery from the level of the [facility] fossa to the level of the wrist. The ulnar and interosseous arteries were patent with normal flow with the interosseous artery reconstituting the radial artery at the wrist. The palmar arch was intact w/ intact digital arteries to  the ring and small finger with occlusion of the digital arteries to the thumb, index, and middle fingers.  -Seen by ortho Dr Raelene Bott on 1/13 He will likely require amputation of multiple fingers. We will continue to monitor the fingers for now to allow for demarcation and ensure appropriate amputation level.   Nutritional Assessment: The patient's BMI is: Body mass index is 18.88 kg/m.Marland Kitchen Seen by dietician.  I agree with the assessment and plan as outlined below: Nutrition Status: Nutrition Problem: Severe Malnutrition Etiology: chronic illness (ampullary carcinoma with malignant biliary stricture) Signs/Symptoms: severe fat depletion, severe muscle depletion Interventions: Boost Breeze, Refer to RD note for recommendations, MVI  . FTT: with multiple comorbidities, over all prognosis is very poor, palliative care consulted       I have Reviewed nursing notes, Vitals, pain scores, I/o's, Lab results and  imaging results since pt's last encounter, details please see discussion above  I ordered the following labs:  Unresulted Labs (From admission, onward)     Start     Ordered   10/14/22 0500  CBC  Tomorrow morning,   R       Question:  Specimen collection method  Answer:  Lab=Lab collect   10/13/22 1253   10/14/22 4268  Basic metabolic panel  Tomorrow morning,   R       Question:  Specimen collection method  Answer:  Lab=Lab collect   10/13/22 1300   10/13/22 1020  Culture, blood (Routine X 2) w Reflex to ID Panel  BLOOD CULTURE X 2,   R (with TIMED occurrences)     Question:  Patient immune status  Answer:  Immunocompromised   10/13/22 1020   10/13/22 1020  Urine Culture  (Urine Culture)  Once,   R       Question Answer Comment  Indication Urgency/frequency   Patient immune status Immunocompromised      10/13/22 1020   10/13/22 1017  CEA  Once,   R       Question:  Specimen collection method  Answer:  Lab=Lab collect   10/13/22 1016   10/07/22 0500  CBC  Daily,   R      Question:  Specimen collection method  Answer:  Unit=Unit collect   10/20/2022 0742             DVT prophylaxis: SCDs Start: 10/08/2022 1928   Code Status:   Code Status: DNR  Family Communication: son at bedside  Disposition:   Dispo: The patient is from: home              Anticipated d/c is to: TBD              Anticipated d/c date is: remain in stepdown   Antimicrobials:   Anti-infectives (From admission, onward)    Start     Dose/Rate Route Frequency Ordered Stop   10/12/22 1000  fluconazole (DIFLUCAN) tablet 200 mg        200 mg Oral Daily 10/12/22 0845 10/18/22 0959   10/04/22 0900  fluconazole (DIFLUCAN) IVPB 200 mg  Status:  Discontinued        200 mg 100 mL/hr over 60 Minutes  Intravenous Every 24 hours 10/04/22 0719 10/12/22 0845   10/03/22 1400  ceFAZolin (ANCEF) IVPB 2g/100 mL premix        2 g 200 mL/hr over 30 Minutes Intravenous Every 8 hours 10/03/22 1025 10/08/22 2359   09/28/2022 1800  vancomycin (VANCOCIN) IVPB 1000 mg/200 mL premix  Status:  Discontinued        1,000 mg 200 mL/hr over 60 Minutes Intravenous Every 24 hours 10/12/2022 1823 10/22/2022 0801   10/10/2022 1000  fluconazole (DIFLUCAN) tablet 100 mg  Status:  Discontinued        100 mg Oral Daily 10/25/2022 1450 10/08/2022 1505   10/03/2022 0100  ceFEPIme (MAXIPIME) 2 g in sodium chloride 0.9 % 100 mL IVPB  Status:  Discontinued        2 g 200 mL/hr over 30 Minutes Intravenous Every 12 hours 09/28/2022 1823 10/03/22 1025   10/08/2022 2200  metroNIDAZOLE (FLAGYL) IVPB 500 mg  Status:  Discontinued        500 mg 100 mL/hr over 60 Minutes Intravenous Every 12 hours 10/22/2022 1823 10/03/22 1025   10/07/2022 1730  vancomycin (VANCOREADY) IVPB 1500 mg/300 mL        1,500 mg 150 mL/hr over 120 Minutes Intravenous  Once 10/11/2022 1642 10/22/2022 1951   10/16/2022 1600  piperacillin-tazobactam (ZOSYN) IVPB 3.375 g  Status:  Discontinued        3.375 g 12.5 mL/hr over 240 Minutes Intravenous Every 8 hours 10/07/2022 1451  09/30/2022 1821   10/22/2022 1500  ampicillin-sulbactam (UNASYN) 1.5 g in sodium chloride 0.9 % 100 mL IVPB  Status:  Discontinued        1.5 g 200 mL/hr over 30 Minutes Intravenous Every 8 hours 10/03/2022 1449 09/30/2022 1451   10/10/2022 1500  fluconazole (DIFLUCAN) tablet 200 mg  Status:  Discontinued        200 mg Oral  Once 10/26/2022 1450 10/14/2022 1505   10/19/2022 1300  ampicillin-sulbactam (UNASYN) 1.5 g in sodium chloride 0.9 % 100 mL IVPB        1.5 g 200 mL/hr over 30 Minutes Intravenous To Endoscopy 09/30/2022 1244 10/11/2022 1616           Objective: Vitals:   10/13/22 1000 10/13/22 1132 10/13/22 1200 10/13/22 1220  BP: 120/84  90/76 (!) 85/70  Pulse:      Resp: 15  (!) 21 (!) 25  Temp:  98.1 F (36.7 C)    TempSrc:  Axillary    SpO2:      Weight:      Height:        Intake/Output Summary (Last 24 hours) at 10/13/2022 1300 Last data filed at 10/13/2022 1200 Gross per 24 hour  Intake 275.51 ml  Output 650 ml  Net -374.49 ml   Filed Weights   10/11/22 0314 10/12/22 0415 10/13/22 0448  Weight: 67.3 kg 70.9 kg 64.9 kg    Examination:  General exam: alert, awake, oriented, very weak, does not appear in acute distress Respiratory system: Clear to auscultation. Respiratory effort normal. Cardiovascular system:  IRRR.  Gastrointestinal system: Abdomen is nondistended, soft and nontender.  Normal bowel sounds heard. Central nervous system: Alert and oriented. No focal neurological deficits. Extremities:  bilateral lower extremity trace edema, left hand/finger discoloration/edema Skin: as above Psychiatry: calm and cooperative     Data Reviewed: I have personally reviewed  labs and visualized  imaging studies since the last encounter and formulate the plan        Scheduled  Meds:  Chlorhexidine Gluconate Cloth  6 each Topical Daily   digoxin  0.125 mg Oral Daily   [START ON 10/14/2022] dronabinol  2.5 mg Oral QAC lunch   enoxaparin (LOVENOX) injection  60 mg  Subcutaneous Q12H   feeding supplement  1 Container Oral TID BM   feeding supplement  237 mL Oral BID BM   fluconazole  200 mg Oral Daily   insulin aspart  0-9 Units Subcutaneous TID AC & HS   metoprolol tartrate  12.5 mg Oral BID   multivitamin with minerals  1 tablet Oral Daily   sodium chloride flush  3 mL Intravenous Q12H   Continuous Infusions:  sodium chloride Stopped (10/12/22 0408)   dextrose 5 % and 0.45% NaCl 50 mL/hr at 10/13/22 1218   heparin 1,200 Units/hr (10/13/22 1125)   sodium chloride       LOS: 14 days   Time spent:  61mns  FFlorencia Reasons MD PhD FACP [facility]  Available via Epic secure chat 7am-7pm for nonurgent issues Please page for urgent issues To page the attending provider between 7A-7P or the covering provider during after hours 7P-7A, please log into the web site [email or web address] and access using universal [place] password for that web site. If you do not have the password, please call the hospital operator.    10/13/2022, 1:00 PM

## 2022-10-14 ENCOUNTER — Ambulatory Visit: Payer: Medicare Other | Admitting: Radiation Oncology

## 2022-10-14 DIAGNOSIS — Z66 Do not resuscitate: Secondary | ICD-10-CM

## 2022-10-14 DIAGNOSIS — Z515 Encounter for palliative care: Secondary | ICD-10-CM

## 2022-10-14 DIAGNOSIS — E43 Unspecified severe protein-calorie malnutrition: Secondary | ICD-10-CM | POA: Diagnosis not present

## 2022-10-14 DIAGNOSIS — R627 Adult failure to thrive: Secondary | ICD-10-CM | POA: Diagnosis not present

## 2022-10-14 DIAGNOSIS — R0600 Dyspnea, unspecified: Secondary | ICD-10-CM

## 2022-10-14 DIAGNOSIS — C241 Malignant neoplasm of ampulla of Vater: Secondary | ICD-10-CM | POA: Diagnosis not present

## 2022-10-14 DIAGNOSIS — R52 Pain, unspecified: Secondary | ICD-10-CM

## 2022-10-14 DIAGNOSIS — Z79899 Other long term (current) drug therapy: Secondary | ICD-10-CM

## 2022-10-14 LAB — BASIC METABOLIC PANEL
Anion gap: 17 — ABNORMAL HIGH (ref 5–15)
Anion gap: 13 (ref 5–15)
BUN: 40 mg/dL — ABNORMAL HIGH (ref 8–23)
BUN: 47 mg/dL — ABNORMAL HIGH (ref 8–23)
CO2: 15 mmol/L — ABNORMAL LOW (ref 22–32)
CO2: 20 mmol/L — ABNORMAL LOW (ref 22–32)
Calcium: 8 mg/dL — ABNORMAL LOW (ref 8.9–10.3)
Calcium: 7.8 mg/dL — ABNORMAL LOW (ref 8.9–10.3)
Chloride: 108 mmol/L (ref 98–111)
Chloride: 110 mmol/L (ref 98–111)
Creatinine, Ser: 1.85 mg/dL — ABNORMAL HIGH (ref 0.61–1.24)
Creatinine, Ser: 1.97 mg/dL — ABNORMAL HIGH (ref 0.61–1.24)
GFR, Estimated: 39 mL/min — ABNORMAL LOW (ref >60)
GFR, Estimated: 37 mL/min — ABNORMAL LOW (ref >60)
Glucose, Bld: 172 mg/dL — ABNORMAL HIGH (ref 70–99)
Glucose, Bld: 162 mg/dL — ABNORMAL HIGH (ref 70–99)
Potassium: 5.3 mmol/L — ABNORMAL HIGH (ref 3.5–5.1)
Potassium: 4 mmol/L (ref 3.5–5.1)
Sodium: 140 mmol/L (ref 135–145)
Sodium: 143 mmol/L (ref 135–145)

## 2022-10-14 LAB — GLUCOSE, CAPILLARY
Glucose-Capillary: 187 mg/dL — ABNORMAL HIGH (ref 70–99)
Glucose-Capillary: 159 mg/dL — ABNORMAL HIGH (ref 70–99)

## 2022-10-14 LAB — CEA: CEA: 115 ng/mL — ABNORMAL HIGH (ref 0.0–4.7)

## 2022-10-14 LAB — URINE CULTURE: Culture: NO GROWTH

## 2022-10-14 LAB — DIGOXIN LEVEL: Digoxin Level: 0.3 ng/mL — ABNORMAL LOW (ref 0.8–2.0)

## 2022-10-14 LAB — CBC
HCT: 36.4 % — ABNORMAL LOW (ref 39.0–52.0)
Hemoglobin: 12 g/dL — ABNORMAL LOW (ref 13.0–17.0)
MCH: 32.6 pg (ref 26.0–34.0)
MCHC: 33 g/dL (ref 30.0–36.0)
MCV: 98.9 fL (ref 80.0–100.0)
Platelets: 175 10*3/uL (ref 150–400)
RBC: 3.68 MIL/uL — ABNORMAL LOW (ref 4.22–5.81)
RDW: 16.2 % — ABNORMAL HIGH (ref 11.5–15.5)
WBC: 41.9 10*3/uL — ABNORMAL HIGH (ref 4.0–10.5)
nRBC: 0 % (ref 0.0–0.2)

## 2022-10-14 LAB — LACTIC ACID, PLASMA: Lactic Acid, Venous: 4.8 mmol/L (ref 0.5–1.9)

## 2022-10-14 MED ORDER — LORAZEPAM 2 MG/ML IJ SOLN: 1.0000 mg | INTRAMUSCULAR | Status: DC | PRN

## 2022-10-14 MED ORDER — HYDROMORPHONE HCL-NACL 50-0.9 MG/50ML-% IV SOLN
0.5000 mg/h | INTRAVENOUS | Status: DC
  Administered 2022-10-14: 0.5 mg/h via INTRAVENOUS
  Filled 2022-10-14: qty 50

## 2022-10-14 MED ORDER — HALOPERIDOL LACTATE 5 MG/ML IJ SOLN: 0.5000 mg | INTRAMUSCULAR | Status: DC | PRN

## 2022-10-14 MED ORDER — HYDROMORPHONE HCL 1 MG/ML IJ SOLN: 0.5000 mg | INTRAMUSCULAR | Status: DC | PRN

## 2022-10-14 MED ORDER — METOPROLOL TARTRATE 5 MG/5ML IV SOLN
2.5000 mg | Freq: Once | INTRAVENOUS | Status: AC
  Administered 2022-10-14: 2.5 mg via INTRAVENOUS
  Filled 2022-10-14: qty 5

## 2022-10-14 MED ORDER — GLYCOPYRROLATE 0.2 MG/ML IJ SOLN: 0.2000 mg | INTRAMUSCULAR | Status: DC | PRN

## 2022-10-14 MED ORDER — POLYVINYL ALCOHOL 1.4 % OP SOLN: 1.0000 [drp] | Freq: Four times a day (QID) | OPHTHALMIC | Status: DC | PRN

## 2022-10-14 MED ORDER — BIOTENE DRY MOUTH MT LIQD: 15.0000 mL | OROMUCOSAL | Status: DC | PRN

## 2022-10-14 MED ORDER — HEPARIN (PORCINE) 25000 UT/250ML-% IV SOLN: 1200.0000 [IU]/h | INTRAVENOUS | Status: DC

## 2022-10-14 MED ORDER — SODIUM CHLORIDE 0.9 % IV BOLUS
1000.0000 mL | Freq: Once | INTRAVENOUS | Status: AC
  Administered 2022-10-14: 1000 mL via INTRAVENOUS

## 2022-10-14 MED ORDER — HYDROMORPHONE BOLUS VIA INFUSION: 1.0000 mg | INTRAVENOUS | Status: DC | PRN

## 2022-10-14 MED ORDER — SODIUM ZIRCONIUM CYCLOSILICATE 5 G PO PACK
5.0000 g | PACK | Freq: Once | ORAL | Status: AC
  Administered 2022-10-14: 5 g via ORAL
  Filled 2022-10-14: qty 1

## 2022-10-14 NOTE — Progress Notes (Signed)
CROSS COVER NOTE  NAME: [name] [name] MRN: [id number] DOB : 15-Nov-1955    Date of Service   10/14/2022   HPI/Events of Note   Bedside RN's poor urine output, 10 cc/h.  Patient has EF of 20%.  Patient currently receiving IVF running at 50 cc/h.  RN denies change in respiratory status or crackles.  Will order small fluid bolus to improve current urine output.  Labs to be checked this morning.   Interventions/ Plan   250 cc bolus       Raenette Rover, DNP, [place]

## 2022-10-14 NOTE — Progress Notes (Signed)
Nutrition Follow-up  DOCUMENTATION CODES:   Severe malnutrition in context of chronic illness  INTERVENTION:  - Regular diet.  - Continue Ensure Plus High Protein po BID, each supplement provides 350 kcal and 20 grams of protein. - Encourage intake as tolerated. - Daily multivitamin to support micronutrient needs. - Monitor weight trends.    NUTRITION DIAGNOSIS:   Severe Malnutrition related to chronic illness (ampullary carcinoma with malignant biliary stricture) as evidenced by severe fat depletion, severe muscle depletion. *ongoing  GOAL:   Patient will meet greater than or equal to 90% of their needs *unmet  MONITOR:   PO intake, Weight trends, Labs, Supplement acceptance  REASON FOR ASSESSMENT:   Consult Assessment of nutrition requirement/status  ASSESSMENT:   67 y.o. 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  Patient resting with eyes closed at time of visit. Brother at bedside and praying with patient. Brother reports this is the first time he has been able to see his brother since he has a heart condition himself.  They state patient has declined recently and think he hid some of his health problems for awhile. Since admission, brother notes he is appreciative of care patient is receiving. He notes the patient's wife came in yesterday and the patient ate a little and drank his Ensure. He has been receiving Boost Breeze but was changed to Ensure. Family to support patient as needed.  It is noted patient had a decline in status today so plan to gold on radiation today.   Medications reviewed and include: Marinol, Insulin, MVI  Labs reviewed:  K+ 5.3 Creatinine 1.85   Diet Order:   Diet Order             Diet  regular Room service appropriate? Yes; Fluid consistency: Thin  Diet effective now                   EDUCATION NEEDS:  Education needs have been addressed  Skin:  Skin Assessment: Reviewed RN Assessment  Last BM:  1/17  Height:  Ht Readings from Last 1 Encounters:  10/08/2022 '6\' 1"'$  (1.854 m)   Weight:  Wt Readings from Last 1 Encounters:  10/14/22 65.6 kg    BMI:  Body mass index is 19.08 kg/m.  Estimated Nutritional Needs:  Kcal:  1900-2100 Protein:  100-115g Fluid:  2L/day    [name] RD, LDN For contact information, refer to [facility].

## 2022-10-14 NOTE — Progress Notes (Signed)
PROGRESS NOTE    [name] [name]  VOJ:[id number] DOB: Nov 10, 1955 DOA: 10/25/2022 PCP: Terrilyn Saver, NP     Brief Narrative:  [name] [name] is a 67 y.o. 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[place]) Active Problems:   Hypokalemia   ICD (implantable cardioverter-defibrillator) in place   Biliary obstruction due to malignant neoplasm (HCC)   Chronic systolic CHF (congestive heart failure) (HCC)   Hematochezia   Nonischemic cardiomyopathy (HCC)   Abnormal CT scan, colon   Secondary malignancy of biliary tract (HCC)   Elevated CEA   Abnormal LFTs   Severe sepsis with septic shock ([place])   Palliative care encounter   Failure to thrive in adult   Severe protein-calorie malnutrition (HCC)   HFrEF (heart failure with reduced ejection fraction) (HCC)   Goals of care, counseling/discussion   Counseling and coordination of care   Common bile duct (CBD) stricture   Chronic diarrhea   Esophageal candidiasis ([place])   Cholangiocarcinoma ([place])   Need for emotional support    Assessment and Plan:   Worsening leukocytosis and AKI developed on 1/18 -Concerning for internal organ ischemia, STAT CT ab/pel ordered, repeat bmp, add lactic acid -prognosis is very poor if  repeat  labs  continue to get worse, case discussed with vascular surgery, nephrology and palliative care over the phone, also updated son at bedside and daughter over the phone    Colon cancer , (biopsy from  flex sig by Dr Benson Norway on 1/13), seen by gen surg, thought not a candidate for surgery Ampullary cancer, was admitted to the ICU in septic shock due to cholangitis , s/p ERCP and biliary stenting on 10/01/21 , finished abx treatment Seen by oncology Dr Marin Olp who recommended XRT ,per Dr Marin Olp , he is not  a candidate for combined radiation with chemotherapy Appreciate oncology and rad onc input  Septic shock from cholangitis [place] Bacteremia  Septic shock resolved, Treated with zosyn, flagyl then  ancef, last dose on 1/12 Acute respiratory failure required intubation initially, now on 2liter oxygen  Esophageal Candidiasis -Diffuse, yellow plaques were found in the entire esophagus seen on ERCP -C/w Fluconazole 1/8 and has a stop date of 10/17/2022  Persistent leukocytosis -Cxr on 1/17 showed "New left lower lobe consolidation or atelectasis." He has no cough, denies sob or chest pain -repeat blood culture from 1/17 no growth so far, urine culture in process   Hypoglycemia Likely from poor oral intake, start d51/2saline on 1/17   AFib Chronic systolic CHF (EF 37%) CAD -On coreg/dig/imdur/crestor/zetia/asa '81mg'$  /farxiga at home -He is in afib , heart rate around 110-120, Currently on dig only, restart low dose lopressor  with holding parameters -Has borderline bp, does not appear grossly volume overloaded, suspect intravacullary dry with some third  spacing on lower extremity ( venous doppler no DVT) -ivf bolus, Close monitor volume status    PAD/occlusion of the radial artery from the level of the AC fossa to the level of the wrist.  -an A-line was placed in the left radial artery initially in the ICU -He was evaluated by vascular surgery on 10/05/21 and underwent angiogram  on 1/10. The study noted occlusion of the radial artery from the level of the [facility] fossa to the level of the wrist. The ulnar and interosseous arteries were patent with normal flow with the interosseous artery reconstituting the radial artery at the wrist. The palmar arch was intact w/ intact digital arteries to the ring and small finger with occlusion of the digital arteries to the thumb, index, and middle fingers.  -Seen by ortho Dr Raelene Bott on 1/13 -He will likely require amputation of multiple fingers/arm. Awaiting for  demarcation  Nutritional Assessment: The patient's BMI is: Body mass index is 19.08 kg/m.Marland Kitchen Seen by dietician.  I agree with the assessment and plan as outlined below: Nutrition Status: Nutrition Problem: Severe Malnutrition Etiology: chronic illness (ampullary carcinoma with malignant biliary stricture) Signs/Symptoms: severe fat depletion, severe muscle depletion Interventions: Boost Breeze, Refer to RD note for recommendations, MVI  . FTT: with multiple comorbidities, over all prognosis is very poor, palliative care consulted       I have Reviewed nursing notes, Vitals, pain scores, I/o's, Lab results and  imaging results since pt's last encounter, details please see discussion above  I ordered the following labs:  Unresulted Labs (From admission, onward)     Start     Ordered   11-13-2022 0500  CBC  Daily,   R     Question:  Specimen collection method  Answer:  Lab=Lab collect   10/14/22 1236   11/13/22 0400  Heparin level (unfractionated)  Once-Timed,   R       Question:  Specimen collection method  Answer:  Lab=Lab collect   10/14/22 1236   10/14/22 1333  Lactic acid, plasma  Once-Timed,   TIMED       Question:  Specimen collection method  Answer:  Lab=Lab collect   10/14/22 1332   10/14/22 8242  Basic metabolic panel  Once,   R       Question:  Specimen collection method  Answer:  Lab=Lab collect   10/14/22 1320   10/13/22 1707  Hemoglobin and hematocrit,  blood  5A & 5P,   R (with TIMED occurrences)     Question:  Specimen collection method  Answer:  Lab=Lab collect   10/13/22 1706   10/13/22 1020  Urine Culture  (Urine Culture)  Once,   R       Question Answer Comment  Indication Urgency/frequency   Patient immune status Immunocompromised      10/13/22 1020   10/07/22 0500  CBC  Daily,   R     Question:  Specimen collection method  Answer:  Unit=Unit collect   09/30/2022 0742             DVT prophylaxis: SCDs Start: 10/26/2022 1928   Code Status:   Code Status: DNR  Family Communication: son at bedside , daughter over the phone Disposition:   Dispo: The patient is from: home              Anticipated d/c is to: TBD              Anticipated d/c date is: remain in stepdown ,  poor prognosis  Antimicrobials:   Anti-infectives (From admission, onward)    Start     Dose/Rate Route Frequency Ordered Stop   10/12/22 1000  fluconazole (DIFLUCAN) tablet 200 mg        200 mg Oral Daily 10/12/22 0845 10/18/22 0959   10/04/22 0900  fluconazole (DIFLUCAN) IVPB 200 mg  Status:  Discontinued        200 mg 100 mL/hr over 60 Minutes Intravenous Every 24 hours 10/04/22 0719 10/12/22 0845   10/03/22 1400  ceFAZolin (ANCEF) IVPB 2g/100 mL premix        2 g 200 mL/hr over 30 Minutes Intravenous Every 8 hours 10/03/22 1025 10/08/22 2359   10/19/2022 1800  vancomycin (VANCOCIN) IVPB 1000 mg/200 mL premix  Status:  Discontinued        1,000 mg 200 mL/hr over 60 Minutes Intravenous Every 24 hours 10/03/2022 1823 10/22/2022 0801   10/03/2022 1000  fluconazole (DIFLUCAN) tablet 100 mg  Status:  Discontinued        100 mg Oral Daily 10/08/2022 1450 10/08/2022 1505   09/28/2022 0100  ceFEPIme (MAXIPIME) 2 g in sodium chloride 0.9 % 100 mL IVPB  Status:  Discontinued        2 g 200 mL/hr over 30 Minutes Intravenous Every 12 hours 09/28/2022 1823 10/03/22 1025   10/18/2022 2200  metroNIDAZOLE (FLAGYL) IVPB 500 mg  Status:  Discontinued        500 mg 100 mL/hr over 60  Minutes Intravenous Every 12 hours 10/24/2022 1823 10/03/22 1025   09/30/2022 1730  vancomycin (VANCOREADY) IVPB 1500 mg/300 mL        1,500 mg 150 mL/hr over 120 Minutes Intravenous  Once 10/16/2022 1642 10/16/2022 1951   10/05/2022 1600  piperacillin-tazobactam (ZOSYN) IVPB 3.375 g  Status:  Discontinued        3.375 g 12.5 mL/hr over 240 Minutes Intravenous Every 8 hours 09/27/2022 1451 10/08/2022 1821   10/11/2022 1500  ampicillin-sulbactam (UNASYN) 1.5 g in sodium chloride 0.9 % 100 mL IVPB  Status:  Discontinued        1.5 g 200 mL/hr over 30 Minutes Intravenous Every 8 hours 10/16/2022 1449 10/11/2022 1451   10/26/2022 1500  fluconazole (DIFLUCAN) tablet 200 mg  Status:  Discontinued        200 mg Oral  Once 10/22/2022 1450 10/25/2022 1505   10/17/2022 1300  ampicillin-sulbactam (UNASYN) 1.5 g in sodium chloride 0.9 % 100 mL IVPB        1.5 g 200 mL/hr over 30 Minutes Intravenous To Endoscopy 10/27/2022 1244 10/23/2022 1616           Objective: Vitals:   10/14/22 1100 10/14/22 1137 10/14/22 1200 10/14/22 1300  BP:   138/81   Pulse: (!) 52  (!) 55 (!) 54  Resp: '10  14 12  '$ Temp:  (!) 97.4 F (36.3 C)    TempSrc:  Axillary    SpO2: 91%  90% 94%  Weight:      Height:        Intake/Output Summary (Last 24 hours) at 10/14/2022 1406 Last data filed at 10/14/2022 0941 Gross per 24 hour  Intake 1364.96 ml  Output 100 ml  Net 1264.96 ml   Filed Weights   10/12/22 0415 10/13/22 0448 10/14/22 0500  Weight: 70.9 kg 64.9 kg 65.6 kg    Examination:  General exam: he is very weak, lethargic ,does not appear in acute distress Respiratory system: Clear to auscultation. Respiratory effort normal. Cardiovascular  system:  IRRR.  Gastrointestinal system: Abdomen is nondistended, soft and nontender.  Normal bowel sounds heard. Central nervous system: Lethargic, does not answer question appropriately,. No focal neurological deficits. Extremities:  bilateral lower extremity trace edema, left hand/finger  discoloration/edema/blistering Skin: as above Psychiatry: calm and cooperative but very weak    Data Reviewed: I have personally reviewed  labs and visualized  imaging studies since the last encounter and formulate the plan        Scheduled Meds:  Chlorhexidine Gluconate Cloth  6 each Topical Daily   dronabinol  2.5 mg Oral QAC lunch   feeding supplement  237 mL Oral BID BM   fluconazole  200 mg Oral Daily   insulin aspart  0-9 Units Subcutaneous TID AC & HS   metoprolol tartrate  12.5 mg Oral BID   multivitamin with minerals  1 tablet Oral Daily   sodium chloride flush  3 mL Intravenous Q12H   Continuous Infusions:  sodium chloride Stopped (10/12/22 0408)   heparin       LOS: 15 days   Time spent:  73mns  FFlorencia Reasons MD PhD FACP [facility]  Available via Epic secure chat 7am-7pm for nonurgent issues Please page for urgent issues To page the attending provider between 7A-7P or the covering provider during after hours 7P-7A, please log into the web site [email or web address] and access using universal [place] password for that web site. If you do not have the password, please call the hospital operator.    10/14/2022, 2:06 PM

## 2022-10-14 NOTE — Progress Notes (Signed)
OT Cancellation Note  Patient Details Name: Fardeen Steinberger MRN: [id number] DOB: 08-29-1956   Cancelled Treatment:    Reason Eval/Treat Not Completed: Other (comment). RN reports medical decline and need for [place] discussion with family. OT will sign off for now. Please reorder if patient becomes medically stable.  Jalaiya Oyster L Bethanee Redondo 10/14/2022, 10:06 AM

## 2022-10-14 NOTE — Progress Notes (Signed)
Pt has had a decline in status; hospitalist team working up new clinical findings. I discussed with his daughter Bonnita Nasuti who is his primary surrogate Media planner. We discussed options of still proceeding with radiation today or holding til Monday to sort out his other medical issues that seem to be more acute. Especially given stable hgb in the last 48 hours. If something were to abruptly change with rectal bleeding we can consider starting sooner. Bonnita Nasuti is in favor of holding radiation to the sigmoid colon til Monday 10/18/22.     Carola Rhine, PAC

## 2022-10-14 NOTE — Progress Notes (Addendum)
ANTICOAGULATION CONSULT NOTE   Pharmacy Consult for enoxaparin >> heparin Indication: occluded left radial artery; ischemic left hand  No Known Allergies  Patient Measurements: Height: '6\' 1"'$  (185.4 cm) Weight: 65.6 kg (144 lb 10 oz) IBW/kg (Calculated) : 79.9 Heparin Dosing Weight: 63 kg  Vital Signs: Temp: 97.6 F (36.4 C) (01/18 0736) Temp Source: Axillary (01/18 0736) BP: 121/87 (01/18 0325) Pulse Rate: 51 (01/18 0327)  Labs: Recent Labs    10/12/22 0318 10/12/22 2041 10/13/22 0442 10/13/22 1733 10/14/22 0505 10/14/22 0743  HGB 11.5*  --  13.8 11.3*  --  12.0*  HCT 35.9*  --  42.6 34.8*  --  36.4*  PLT 146*  --  157  --   --  175  HEPARINUNFRC  --  0.41 0.33  --   --   --   CREATININE 0.83  --  0.94  --  1.85*  --      Estimated Creatinine Clearance: 36 mL/min (A) (by C-G formula based on SCr of 1.85 mg/dL (H)).   Assessment: Patient is a 67 y.o M with ampullary carcinoma with biliary obstruction (s/p ERCP on 09/17/22 with stent placement) who presented to the ED on 10/08/2022, from his oncologist office visit, after he was found to have persistent hyperbilirubinemia.  He underwent a repeat ERCP on 09/30/2022 that showed esophageal plaques, hiatal hernia, pus in the major papilla, and one partially occluded stent from the biliary tree. Swelling noted in his left hand on 10/05/22. Upper extremity US performed on 10/05/22 showed occluded left radial artery and acute left superficial vein thrombosis.    Pt was started on IV UFH on 1/10, which was subsequently held 1/12 due to rectal bleeding. Anticoagulation was resumed on 1/16, currently on therapeutic enoxaparin. Discussed with MD -due to worsening renal function, will transition anticoagulation back to heparin drip.  Anticoagulation History during Admission: -1/10 >> 1/12 IV UFH. Stopped 1/12 due to rectal bleeding -1/12 >> 1/16 SCDs -1/16 IV UFH resumed per oncology with instructions to not bolus -1/17: UFH >> Therapeutic  LMWH -1/18: LMWH >> UFH given worsening renal function   Today, 10/14/2022: CBC: Hgb slightly low but stable; Plt WNL SCr 1.85, increased from 0.94. CrCl ~36 mL/min Last dose of enoxaparin given on 10/14/22 @ 1035 Pt noted to have some rectal bleeding on 1/17 PM. No current signs of bleeding per RN.  Goal of Therapy:  Heparin level 0.3-0.7 units/ml Monitor platelets by anticoagulation protocol: Yes   Plan:  Will start heparin 12 hours after last enoxaparin dose Initiate heparin infusion with no bolus at previously therapeutic rate of 1200 units/hr Check 6 hour heparin level CBC, HL daily while on heparin infusion Monitor for signs of bleeding  Lenis Noon, PharmD 10/14/22 12:25 PM  Addendum: RN informed me that pt had bloody BM. Discussed with Dr. Erlinda Hong - MD repeating labs. Continue current anticoagulation plan for now.  Lenis Noon, PharmD 10/14/22 2:30 PM

## 2022-10-14 NOTE — Progress Notes (Signed)
Daily Progress Note   Patient Name: [name] [name]       Date: 10/14/2022 DOB: [birth year]  Age: 67 y.o. MRN#: [id number] Attending Physician: Florencia Reasons, MD Primary Care Physician: Terrilyn Saver, NP Admit Date: 10/22/2022 Length of Stay: 15 days  Reason for Consultation/Follow-up: Establishing goals of care  Subjective:   CC: Patient seen laying in bed, with agonal breathing. Following up regarding complex medical decision making.  Subjective:  Discussed care with multiple care team members throughout the day. Patient's medical status quickly deteriorating. Radiation being held due to deterioration. Leukocytosis increasing to 41.9. Initially complaining of abdominal and hand pain in AM though now pain has ceased. Continues to have tachycardia with worsening hypotension. Patient also had large bloody bowel movement in the afternoon. Minimal urine output.   Discussed care directly with hospitalist. Concern patient is actively dying. Hospitalist has communicated this concern with daughter. May have abdominal infarction. Too unstable for imaging which has been ordered.   When repeat lab work returned in the afternoon, lactic acid up to 4.8 and Cr up to 1.97.   Able to call daughter, [name] [name], as soon as lab work returned to discuss it. [name] [name] was able to place her sister, [name] [name], on the phone as well for updates. Informed them both patient is actively dying. His labs are worsening, he is agonal breathing, and not responsive. Discussed knowing his underlying medical conditions which cannot be reversed, would transition to comfort focused care at this time to allow patient to be comfortable at the end of his life. Both daughters agreeing with this plan. Did inform time is likely very short such as hours to short days.  They plan to inform other family who needs to come visit to say goodbye. All questions answered at this time.   Review of Systems  Objective:   Vital Signs:  BP 121/87   Pulse (!) 51    Temp 97.6 F (36.4 C) (Axillary)   Resp (!) 28   Ht '6\' 1"'$  (1.854 m)   Wt 65.6 kg   SpO2 90%   BMI 19.08 kg/m   Physical Exam: General: not responsive, ill appearing, laying in bed Eyes: No drainage noted HENT: Dry mucous membranes Cardiovascular: tachycardic, edema in LE b/l Respiratory: increased work of breathing noted and then agonal breathing at times Abdomen: very distended and hard to touch Extremities: Muscle wasting present in all extremities Skin: Skin changes cool to touch on Ues and Les b/l  Imaging:  I personally reviewed recent imaging.   Assessment & Plan:   Assessment: Patient is a 67 year old male with a past medical history of nonischemic cardiomyopathy EF 25%, hypertension, and recent diagnosis of ampullary carcinoma with malignant biliary stricture status post stenting on 12/22 who was admitted on 10/21/2022 with persistent hyperbilirubinemia and failure to thrive.  Since admission patient has been evaluated by oncology, GI, and required escalation of care to ICU with PCCM team.  Patient underwent ERCP on 10/05/2022 which found esophageal plaques suspicious for candidiasis, 1 partially occluded stent from the biliary tree was seen in the major papula which was removed, pus was seen in the major papula, another moderate biliary stricture was found in the lower third of the main bile duct which was malignant appearing, and so uncovered metal biliary stent was placed into the common bile duct which resulted in adequate drainage of pus.  After procedure patient was transferred to the ICU for management of septic shock.  PCCM talked with family on 10/18/2022 and  patient's CODE STATUS was changed to DNR.  Patient's medical status has continued to deteriorate during hospitalization.  Palliative medicine team consulted to assist with complex medical decision making.   Recommendations/Plan: # Complex medical decision making/goals of care:  - Patient unable to participate in  complex medical decision making when seen today.   - Spoke with patient's next of kin/daughter, [name] [name],  and her sister, [name] [name], as described above in HPI.  Discussed that patient is actively dying in setting of having known cancer which cannot be fixed. Daughters/NOK agreeing with transitioning to comfort focused care at this time. Will discontinue interventions that are no longer focused on comfort such as IV fluids, imaging, or lab work.  Will instead focus on symptom management of pain, dyspnea, and agitation in the setting of end-of-life care.  - Code Status: DNR   # Symptom management    -Pain/Dyspnea, acute in the setting of end-of-life    Patient has been receiving prn dilaudid. Now having agonal breathing at end of life.                                -Started IV Dilaudid 0.5 mg/hr continuous basal rate with  '1mg'$  q4mns prn. Continue to adjust based on patient's symptom burden.                    -Anxiety/agitation, in the setting of end-of-life care                               -Started IV Ativan 1 mg every 4 hours as needed. Continue to adjust based on patient's symptom burden.                                 -And also has IV Haldol 0.5 mg every 4 hours as needed. Continue to adjust based on patient's symptom burden.                   -Secretions, in the setting of end-of-life care                               -Started IV glycopyrrolate 0.2 mg every 4 hours as needed.  # Psychosocial Support:  -daughters, ex-wife  # Discharge Planning: Transitioning to comfort focused care at this time. Will continue to monitor to determine if patient would be stable for transfer to inpatient hospice facility. At this time, too unstable and likely will have in hospital death.   Discussed with: hospitalist, bedside RN, daughter  Thank you for allowing the palliative care team to participate in the care LClaudean Severance  LChelsea Aus DO Palliative Care Provider PMT # [phone]

## 2022-10-14 NOTE — Progress Notes (Signed)
Physical Therapy Discharge Patient Details Name: [name] [name] MRN: [id number] DOB: Aug 03, 1956 Today's Date: 10/14/2022 Time:  -     Patient discharged from PT services secondary to medical decline - will need to re-order PT to resume therapy services.  Please see latest therapy progress note for current level of functioning and progress toward goals.    GP    [place] Office [phone] Weekend pager-[phone]  [name] 10/14/2022, 9:23 AM

## 2022-10-14 NOTE — Progress Notes (Signed)
Notified NP that HR is sustaining in the 120's-130's, afib, and BP 121/87. New orders given.

## 2022-10-15 ENCOUNTER — Ambulatory Visit: Payer: Medicare Other

## 2022-10-15 ENCOUNTER — Encounter: Payer: Self-pay | Admitting: Orthopedic Surgery

## 2022-10-15 DIAGNOSIS — C241 Malignant neoplasm of ampulla of Vater: Secondary | ICD-10-CM | POA: Diagnosis not present

## 2022-10-18 ENCOUNTER — Ambulatory Visit: Payer: Medicare Other

## 2022-10-18 ENCOUNTER — Encounter: Payer: Self-pay | Admitting: Radiation Oncology

## 2022-10-18 LAB — CULTURE, BLOOD (ROUTINE X 2)
Culture: NO GROWTH
Culture: NO GROWTH
Special Requests: ADEQUATE
Special Requests: ADEQUATE

## 2022-10-18 NOTE — Progress Notes (Signed)
  Radiation Oncology         [phone]) [phone] ________________________________  Name: Geza Beranek MRN: [id number]  Date: 10/18/2022  DOB: Sep 17, 1956  End of Treatment Note  Diagnosis:   Rectal Cancer     Indication for treatment:  palliative       Radiation treatment dates:   n/a  Site/planned dose:   The patient had a prescription for 30 Gy in 10 fractions to treat his rectal tumor.   Narrative: The patient's status declined and he passed away on November 03, 2022 from respiratory complications prior to beginning radiation.  ________________________________     Carola Rhine, PAC

## 2022-10-19 ENCOUNTER — Ambulatory Visit: Payer: Medicare Other

## 2022-10-19 ENCOUNTER — Encounter: Payer: Self-pay | Admitting: *Deleted

## 2022-10-19 ENCOUNTER — Encounter: Payer: Medicare Other | Admitting: Dietician

## 2022-10-19 ENCOUNTER — Ambulatory Visit: Payer: Medicare Other | Admitting: Gastroenterology

## 2022-10-19 NOTE — Progress Notes (Signed)
Patient passed away while hospitalized.   Oncology Nurse Navigator Documentation     10/19/2022   10:00 AM  Oncology Nurse Navigator Flowsheets  Navigation Complete Date: 10/19/2022  Post Navigation: Continue to Follow Patient? No  Reason Not Navigating Patient: Hospice/Death  Navigator Location [facility]  Time Spent with Patient 15

## 2022-10-20 ENCOUNTER — Ambulatory Visit: Payer: Medicare Other

## 2022-10-21 ENCOUNTER — Ambulatory Visit: Payer: Medicare Other

## 2022-10-22 ENCOUNTER — Ambulatory Visit: Payer: Medicare Other

## 2022-10-25 ENCOUNTER — Ambulatory Visit: Payer: Medicare Other

## 2022-10-26 ENCOUNTER — Ambulatory Visit: Payer: Medicare Other

## 2022-10-27 ENCOUNTER — Ambulatory Visit: Payer: Medicare Other

## 2022-10-28 ENCOUNTER — Ambulatory Visit: Payer: Medicare Other

## 2022-10-28 NOTE — Death Summary Note (Addendum)
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   Hypokalemia 09/16/2022    Priority: Medium    ICD (implantable cardioverter-defibrillator) in place 09/16/2022    Priority: Low   Biliary obstruction due to malignant neoplasm ([facility] 09/18/2022    Priority: 2.   Chronic systolic CHF (congestive heart failure) (HCl[facility] 01/31/2015    Priority: 3.   High risk medication use 10/14/2022   Dyspnea 10/14/2022   Pain 10/14/2022   DNR (do not resuscitate) 10/14/2022   End of life care 10/14/2022   Need for emotional support 10/13/2022   Cholangiocarcinoma ([facility] 10/08/2022   Chronic diarrhea 10/04/2022   Esophageal candidiasis ([facility] 10/04/2022   Secondary malignancy of biliary tract ([facility] 10/13/2022   Elevated CEA 10/14/2022   Abnormal LFTs 10/21/2022   Severe sepsis with septic shock ([facility] 10/10/2022   Palliative care encounter 10/19/2022   Failure to thrive in adult 09/28/2022   Severe protein-calorie malnutrition (HK[facility] 10/24/2022   HFrEF (heart failure with reduced ejection fraction) (H[facility] 10/03/2022   Goals of care, counseling/discussion 10/10/2022   Counseling and coordination of care 09/30/2022   Common bile duct (CBD) stricture 10/12/2022   Abnormal CT scan, colon 09/18/2022   Nonischemic cardiomyopathy ([facility] 06/30/2015   Hematochezia 11/06/2013    Resolved Hospital Problems  No resolved problems to display.      Filed Weights   10/13/22 0448 10/14/22 0500 [id number]  Weight: 64.9 kg 65.6 kg 65.6 kg    History of present illness: ( per admitting MD Dr PPosey Pronto Chief Complaint: Ampullary carcinoma   HPI: LWynton Hufstetleris a 67y.o. 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   Patient recently admitted 09/15/2022-09/19/2022 for severe hyperbilirubinemia of 42 with CBD stricture.  He underwent ERCP on 12/22 which showed a severe malignant appearing stricture requiring stent placement.  Cytology showed malignant cells consistent with adenocarcinoma.   Patient had follow-up with oncology, Dr. EMarin Olp earlier today (122-Jan-2024.  He was noted to have persistent hyperbilirubinemia with weight loss and frail appearance.  He was sent to the ED for further evaluation and management.   Patient states that he has frequent loose stools anytime he eats.  He has not had any nausea, vomiting, abdominal pain, chest pain, dyspnea.  Denies fevers or chills.   ED Course  Labs/Imaging on admission: I have personally reviewed following labs and imaging studies.   Initial vitals showed BP 105/76, pulse 52, RR 16, temp 98.7 F, SpO2 100% on room air.   Labs obtained oncology office earlier today showed WBC 14.3, hemoglobin 11.5, platelets 265,000, sodium 139, potassium 2.9, BUN 13, creatinine 1.07, serum glucose 124, total bilirubin 14.4, alk phos 717, AST 57, ALT 39.   Patient was given 250 cc LR and IV K 10 mEq x 2.  EDP notified [facility] via secure chat.  The hospitalist service was consulted to admit for further evaluation and management.  Hospital Course:  Principal Problem:   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[facility] Active Problems:   Hypokalemia   ICD (  implantable cardioverter-defibrillator) in place   Biliary obstruction due to malignant neoplasm (HCC)   Chronic systolic CHF (congestive heart failure) (HCC)   Hematochezia   Nonischemic cardiomyopathy (HCC)   Abnormal CT scan, colon   Secondary malignancy of biliary tract (HCC)   Elevated CEA   Abnormal LFTs   Severe sepsis  with septic shock (HCC)   Palliative care encounter   Failure to thrive in adult   Severe protein-calorie malnutrition (HCC)   HFrEF (heart failure with reduced ejection fraction) (HCC)   Goals of care, counseling/discussion   Counseling and coordination of care   Common bile duct (CBD) stricture   Chronic diarrhea   Esophageal candidiasis (HCC)   Cholangiocarcinoma (HCC)   Need for emotional support   High risk medication use   Dyspnea   Pain   DNR (do not resuscitate)   End of life care   Assessment and Plan:  Worsening leukocytosis and AKI developed on 1/18 -Concerning for internal organ ischemia, as patient likely has hypercoagulable state in the setting of cancer, continued large bloody bowel bleeding from cancer vs bowel ischemia, STAT CT ab/pel ordered, not stable enough to get to CT  -repeat bmp later the day got worse,with  elevated lactic acid -case discussed with vascular surgery, nephrology and palliative care over the phone, transitioned to full comfort measures on 1/18 after discussed with family, appreciate palliative care input -he was started on dilaudid drip, he did not show any sign of discomfort, he passed away at 9:30 am on 16-Oct-2022       Colon cancer , (biopsy from  flex sig by Dr Benson Norway on 1/13), seen by gen surg, thought not a candidate for surgery Ampullary cancer, was admitted to the ICU in septic shock due to cholangitis , s/p ERCP and biliary stenting on 10/01/21 , finished abx treatment Seen by oncology Dr Marin Olp who recommended XRT ,per Dr Marin Olp , he is not  a candidate for combined radiation with chemotherapy Appreciate oncology and rad onc input  transitioned to full comfort measures on 1/18   Septic shock from cholangitis [place] Bacteremia  Septic shock resolved, Treated with zosyn, flagyl then  ancef, last dose on 1/12 Acute respiratory failure required intubation initially.   Esophageal Candidiasis -Diffuse, yellow plaques were found in the  entire esophagus seen on ERCP -treated with Fluconazole  since 1/8    Persistent leukocytosis -Cxr on 1/17 showed "New left lower lobe consolidation or atelectasis." He has no cough, denies sob or chest pain -repeat blood culture from 1/17 no growth so far, urine culture no growth -suspect reactive leukocytosis from GI issues      Hypoglycemia Likely from poor oral intake, start d51/2saline on 1/17     AFib Chronic systolic CHF (EF 50%) CAD -On coreg/dig/imdur/crestor/zetia/asa '81mg'$  /farxiga at home       PAD/occlusion of the radial artery from the level of the [facility] fossa to the level of the wrist.  -an A-line was placed in the left radial artery initially in the ICU -He was evaluated by vascular surgery on 10/05/21 and underwent angiogram on 1/10. The study noted occlusion of the radial artery from the level of the AC fossa to the level of the wrist. The ulnar and interosseous arteries were patent with normal flow with the interosseous artery reconstituting the radial artery at the wrist. The palmar arch was intact w/ intact digital arteries to the ring and small finger with occlusion of the digital arteries to the thumb, index,  and middle fingers.  -Seen by ortho Dr Raelene Bott on 1/13 -was  Awaiting for  demarcation with plan to do amputation    Nutritional Assessment: The patient's BMI is: Body mass index is 19.08 kg/m.Marland Kitchen Seen by dietician.  I agree with the assessment and plan as outlined below: Nutrition Status: Nutrition Problem: Severe Malnutrition Etiology: chronic illness (ampullary carcinoma with malignant biliary stricture) Signs/Symptoms: severe fat depletion, severe muscle depletion Interventions: Boost Breeze, Refer to RD note for recommendations, MVI     Allergies as of 2022/10/29   No Known Allergies      Medication List     STOP taking these medications    aspirin EC 81 MG tablet   carvedilol 3.125 MG tablet Commonly known as: COREG   dapagliflozin  propanediol 10 MG Tabs tablet Commonly known as: Farxiga   digoxin 0.125 MG tablet Commonly known as: LANOXIN   ezetimibe 10 MG tablet Commonly known as: ZETIA   isosorbide mononitrate 30 MG 24 hr tablet Commonly known as: IMDUR   pantoprazole 40 MG tablet Commonly known as: PROTONIX   rosuvastatin 40 MG tablet Commonly known as: CRESTOR   triamcinolone ointment 0.5 % Commonly known as: KENALOG       No Known Allergies    The results of significant diagnostics from this hospitalization (including imaging, microbiology, ancillary and laboratory) are listed below for reference.    Significant Diagnostic Studies: DG CHEST PORT 1 VIEW  Result Date: 10/13/2022 CLINICAL DATA:  Hypoxia. EXAM: PORTABLE CHEST 1 VIEW COMPARISON:  Chest radiograph 10/03/2022 FINDINGS: Endotracheal tube, enteric tube, and right jugular catheter have been removed. An ICD remains in place. There is new confluent retrocardiac opacity in the left lower lobe. The right lung remains clear. No sizable pleural effusion or pneumothorax is identified. A biliary stent is partially visualized. IMPRESSION: Interval extubation. New left lower lobe consolidation or atelectasis. Electronically Signed   By: Logan Bores M.D.   On: 10/13/2022 09:02   PERIPHERAL VASCULAR CATHETERIZATION  Result Date: 10/22/2022 Images from the original result were not included.   Patient name: Seith Aikey MRN: [id number]        DOB: 1956-04-05        Sex: male  09/28/2022 Pre-operative Diagnosis: Left upper extremity hand ischemia status post left radial arterial line Post-operative diagnosis:  Same Surgeon:  Broadus John, MD Procedure Performed: 1.  Ultrasound-guided micropuncture access of the right common femoral artery in retrograde fashion 2.  Arch angiogram 3.  Third order cannulation, left upper extremity angiogram 4.  Moderate sedation 43 minutes 5.  Contrast volume 150 mL 6.  Device assisted closure-Mynx   Indications: This is a  67 y.o. male with what appears to be an ischemic left hand status post radial artery cannulation for hemodynamic monitoring now with occluded radial artery by recent duplex and very weak flow into the hand by ulnar artery.  This is being ongoing for a few days at this point.  Unfortunately patient with decreasing platelets per primary team not a candidate for anticoagulation at this time.  Will plan for angiography from femoral approach tomorrow in the Cath Lab at [facility].  He will be scheduled for the last case the day and will need to be transferred over for this procedure and can be sent back afterwards as long as he is stable for transfer.  I discussed with the patient and his daughter at the bedside that he is at high risk of digit loss with or without revascularization  possibly could lose more than just his digits up to and including his hand.  Will likely require hand surgery involvement in his case.    Findings: Arch angiogram demonstrates normal first-order vessels without stenosis. Left upper extremity: High bifurcation of the radial artery from the proximal brachial artery.  Sluggish flow in the radial artery to the level of the antecubital fossa where ends in collaterals.  The brachial artery continues giving off the ulnar and interosseous arteries. These demonstrate normal flow.  The interosseous artery reconstitutes the radial artery at the level of the wrist.  The palmar arch is intact with digital arteries of the fourth and fifth digits.  There are no branches from the palmar arch to the first second and third digits.              Procedure:  The patient was identified in the holding area and taken to room 8.  The patient was then placed supine on the table and prepped and draped in the usual sterile fashion.  A time out was called.  Ultrasound was used to evaluate the right common femoral artery.  It was patent .  A digital ultrasound image was acquired.  A micropuncture needle was used to access  the right common femoral artery under ultrasound guidance.  An 018 wire was advanced without resistance and a micropuncture sheath was placed.  The 018 wire was removed and a benson wire was placed.  The micropuncture sheath was exchanged for a 5 french sheath.  The patient was heparinized, and a pigtail flush catheter was placed in the ascending aorta for arch angiogram.  See results above.  Next, a Glidewire advantage wire was used to select the left subclavian artery.  The wire was exchanged for a cathter that was parked in the left axillary artery.  See results above.  In an effort to assess whether recannulization of the radial artery would improve distal flow, a wire was advanced through the radial artery into the distal patent portion located at the wrist.  Follow-up angiography demonstrated no improvement hand fifth digit perfusion.  Being that perfusion was unchanged with angiography from the distal radial artery elected to end the case.  Arteriotomy was managed with a minx device.  Impression: High bifurcation of the radial artery with occlusion from the antecubital fossa to the wrist.  There is reconstitution of the radial artery via the interosseous artery.  In the hand, thrombosis of digital arteries in the first, second and third digits.  No flow in these digits. Palmar arch intact.  Recommend hand surgery involvement as patient will require future amputation.   Cassandria Santee, MD [facility] Office: [phone]   VAS [place] UPPER EXTREMITY VENOUS DUPLEX  Result Date: 10/05/2022 UPPER VENOUS STUDY  Patient Name:  [name]  Date of Exam:   10/05/2022 Medical Rec #: [id number]       Accession #:    [id number] Date of Birth: [birth year]      Patient Gender: M Patient Age:   40 years Exam Location:  [facility] Procedure:      VAS [place] UPPER EXTREMITY VENOUS DUPLEX Referring Phys: Burney Gauze  --------------------------------------------------------------------------------  Indications: Swelling Risk Factors: Cancer. Limitations: Body habitus, poor ultrasound/tissue interface and patient positioning. Comparison Study: No prior studies. Performing Technologist: Oliver Hum RVT  Examination Guidelines: A complete evaluation includes B-mode imaging, spectral Doppler, color Doppler, and power Doppler as needed of all accessible portions of each vessel. Bilateral  testing is considered an integral part of a complete examination. Limited examinations for reoccurring indications may be performed as noted.  Right Findings: +----------+------------+---------+-----------+----------+-------+ RIGHT     CompressiblePhasicitySpontaneousPropertiesSummary +----------+------------+---------+-----------+----------+-------+ Subclavian    Full       Yes       Yes                      +----------+------------+---------+-----------+----------+-------+  Left Findings: +----------+------------+---------+-----------+----------+-------+ LEFT      CompressiblePhasicitySpontaneousPropertiesSummary +----------+------------+---------+-----------+----------+-------+ IJV           Full       Yes       Yes                      +----------+------------+---------+-----------+----------+-------+ Subclavian    Full       Yes       Yes                      +----------+------------+---------+-----------+----------+-------+ Axillary      Full       Yes       Yes                      +----------+------------+---------+-----------+----------+-------+ Brachial      Full       Yes       Yes                      +----------+------------+---------+-----------+----------+-------+ Radial        Full                                          +----------+------------+---------+-----------+----------+-------+ Ulnar         Full                                           +----------+------------+---------+-----------+----------+-------+ Cephalic      Full                                          +----------+------------+---------+-----------+----------+-------+ Basilic       None                                   Acute  +----------+------------+---------+-----------+----------+-------+  Summary:  Right: No evidence of thrombosis in the subclavian.  Left: No evidence of deep vein thrombosis in the upper extremity. Findings consistent with acute superficial vein thrombosis involving the left basilic vein.  *See table(s) above for measurements and observations.  Diagnosing physician: Servando Snare MD Electronically signed by Servando Snare MD on 10/05/2022 at 3:05:52 PM.    Final    VAS [place] UPPER EXTREMITY ARTERIAL DUPLEX  Result Date: 10/05/2022  UPPER EXTREMITY DUPLEX STUDY Patient Name:  TRAMPUS MCQUERRY  Date of Exam:   10/05/2022 Medical Rec #: [id number]       Accession #:    [id number] Date of Birth: [birth year]      Patient Gender: M Patient Age:   43 years Exam Location:  [facility] Procedure:      VAS [place] UPPER  EXTREMITY ARTERIAL DUPLEX Referring Phys: EMILY AVENTURA --------------------------------------------------------------------------------  Indications: Assymetrical blood pressures.  Risk Factors: Hypertension. Limitations: Body habitus, poor ultrasound/tissue interface and patient              positioning. Comparison Study: No prior studies. Performing Technologist: Oliver Hum RVT  Examination Guidelines: A complete evaluation includes B-mode imaging, spectral Doppler, color Doppler, and power Doppler as needed of all accessible portions of each vessel. Bilateral testing is considered an integral part of a complete examination. Limited examinations for reoccurring indications may be performed as noted.  Left Doppler Findings: +--------------+----------+---------+--------+--------+ Site          PSV (cm/s)Waveform StenosisComments  +--------------+----------+---------+--------+--------+ Subclavian Mid54        triphasic                 +--------------+----------+---------+--------+--------+ Axillary      37        triphasic                 +--------------+----------+---------+--------+--------+ Brachial Prox 39        biphasic                  +--------------+----------+---------+--------+--------+ Radial Prox   0                  occluded         +--------------+----------+---------+--------+--------+ Radial Mid    0                  occluded         +--------------+----------+---------+--------+--------+ Radial Dist   0                  occluded         +--------------+----------+---------+--------+--------+ Ulnar Prox    46        biphasic                  +--------------+----------+---------+--------+--------+ Ulnar Mid     36        biphasic                  +--------------+----------+---------+--------+--------+ Ulnar Dist    32        biphasic                  +--------------+----------+---------+--------+--------+ The radial and ulnar arteries bifurcate in the proximal upper arm.  Summary:  Left: Obstruction noted in the radial artery. *See table(s) above for measurements and observations. Electronically signed by Servando Snare MD on 10/05/2022 at 3:05:43 PM.    Final    DG Chest Port 1 View  Result Date: 10/03/2022 CLINICAL DATA:  Acute respiratory failure EXAM: PORTABLE CHEST 1 VIEW COMPARISON:  March 03, 2023 FINDINGS: An ETT terminates in the trachea. An NG tube terminates below today's film. A right central line terminates in the SVC. Stable AICD device. No pneumothorax. The lungs are clear. The cardiomediastinal silhouette is stable. IMPRESSION: 1. Support apparatus as above. 2. No acute abnormalities. Electronically Signed   By: Dorise Bullion III M.D.   On: 10/03/2022 10:48   DG Abd 1 View  Result Date: 10/26/2022 CLINICAL DATA:  OG tube placement. EXAM: ABDOMEN - 1 VIEW  COMPARISON:  10/05/2022. FINDINGS: Borderline distended loop of small bowel in the mid left abdomen measuring 3.1 cm. An enteric tube terminates in the stomach. Common bile duct stent is unchanged. Vascular calcifications are noted in the abdomen. IMPRESSION: Enteric tube is appropriate position. Borderline distended loop of small  bowel in the abdomen, possible ileus versus partial or early obstruction. Electronically Signed   By: Brett Fairy M.D.   On: 10/08/2022 22:52   VAS [place] LOWER EXTREMITY VENOUS (DVT)  Result Date: 10/19/2022  Lower Venous DVT Study Patient Name:  HERBY AMICK  Date of Exam:   10/05/2022 Medical Rec #: [id number]       Accession #:    [id number] Date of Birth: May 28, 1956      Patient Gender: M Patient Age:   52 years Exam Location:  [facility] Procedure:      VAS [place] LOWER EXTREMITY VENOUS (DVT) Referring Phys: Annamaria Boots Carlia Bomkamp --------------------------------------------------------------------------------  Indications: Edema.  Risk Factors: Cancer. Limitations: Poor ultrasound/tissue interface, body habitus and patient positioning. Comparison Study: No prior studies. Performing Technologist: Oliver Hum RVT  Examination Guidelines: A complete evaluation includes B-mode imaging, spectral Doppler, color Doppler, and power Doppler as needed of all accessible portions of each vessel. Bilateral testing is considered an integral part of a complete examination. Limited examinations for reoccurring indications may be performed as noted. The reflux portion of the exam is performed with the patient in reverse Trendelenburg.  +-----+---------------+---------+-----------+----------+--------------+ RIGHTCompressibilityPhasicitySpontaneityPropertiesThrombus Aging +-----+---------------+---------+-----------+----------+--------------+ CFV  Full           Yes      Yes                                 +-----+---------------+---------+-----------+----------+--------------+    +---------+---------------+---------+-----------+----------+--------------+ LEFT     CompressibilityPhasicitySpontaneityPropertiesThrombus Aging +---------+---------------+---------+-----------+----------+--------------+ CFV      Full           Yes      Yes                                 +---------+---------------+---------+-----------+----------+--------------+ SFJ      Full                                                        +---------+---------------+---------+-----------+----------+--------------+ FV Prox  Full                                                        +---------+---------------+---------+-----------+----------+--------------+ FV Mid   Full                                                        +---------+---------------+---------+-----------+----------+--------------+ FV DistalFull                                                        +---------+---------------+---------+-----------+----------+--------------+ PFV      Full                                                        +---------+---------------+---------+-----------+----------+--------------+  POP      Full           Yes      Yes                                 +---------+---------------+---------+-----------+----------+--------------+ PTV      Full                                                        +---------+---------------+---------+-----------+----------+--------------+ PERO     Full                                                        +---------+---------------+---------+-----------+----------+--------------+    Summary: RIGHT: - No evidence of common femoral vein obstruction.  LEFT: - There is no evidence of deep vein thrombosis in the lower extremity.  - No cystic structure found in the popliteal fossa.  *See table(s) above for measurements and observations. Electronically signed by Orlie Pollen on 10/26/2022 at 1:19:12 PM.    Final    DG Chest Port 1  View  Result Date: 10/10/2022 CLINICAL DATA:  Acute respiratory failure EXAM: PORTABLE CHEST 1 VIEW COMPARISON:  09/28/2022 FINDINGS: There is a ET tube with tip above the carina. Right IJ catheter tip terminates at the cavoatrial junction. Left chest wall ICD is noted with lead in the right ventricle. Stable cardiomediastinal contours. No pleural effusion or edema. No airspace opacities identified. IMPRESSION: 1. Stable support apparatus. 2. No acute cardiopulmonary abnormalities. Electronically Signed   By: Kerby Moors M.D.   On: 10/26/2022 09:40   DG Abd 1 View  Result Date: 09/28/2022 CLINICAL DATA:  Evaluate enteric tube placement. EXAM: ABDOMEN - 1 VIEW COMPARISON:  CT 09/15/2022 FINDINGS: Interval placement of enteric tube with tip and side port in the gastric fundus below the level of the GE junction. Common bile duct stent is identified. There is diffuse gaseous distension of the small bowel loops which measure up to 3 cm. IMPRESSION: 1. Interval placement of enteric tube with tip and side port in the gastric fundus. 2. Diffuse gaseous distension of the small bowel loops. Which may reflect small bowel ileus or obstruction. Electronically Signed   By: Kerby Moors M.D.   On: 10/23/2022 20:21   ECHOCARDIOGRAM COMPLETE  Result Date: 10/22/2022    ECHOCARDIOGRAM REPORT   Patient Name:   LAVONTAE CORNIA Date of Exam: 09/30/2022 Medical Rec #:  [id number]      Height:       73.0 in Accession #:    [id number]     Weight:       137.9 lb Date of Birth:  06/30/1956     BSA:          1.837 m Patient Age:    47 years       BP:           134/63 mmHg Patient Gender: M              HR:           88 bpm. Exam Location:  Inpatient Procedure: 2D Echo  and Intracardiac Opacification Agent Indications:    cardiomyopathy  History:        Patient has prior history of Echocardiogram examinations, most                 recent 10/30/2021. Cardiomyopathy and CHF; Risk                 Factors:Hypertension and Current Smoker.   Sonographer:    Harvie Junior Referring Phys: [place]  1. Left ventricular ejection fraction, by estimation, is <20%. The left ventricle has severely decreased function. The left ventricle demonstrates global hypokinesis. The left ventricular internal cavity size was severely dilated. There is mild concentric left ventricular hypertrophy. Left ventricular diastolic parameters are indeterminate.  2. Right ventricular systolic function is severely reduced. The right ventricular size is mildly enlarged. There is normal pulmonary artery systolic pressure.  3. Left atrial size was mildly dilated.  4. No evidence of mitral valve regurgitation.  5. Tricuspid valve regurgitation is mild to moderate.  6. The aortic valve is tricuspid. Aortic valve regurgitation is not visualized.  7. The inferior vena cava is normal in size with greater than 50% respiratory variability, suggesting right atrial pressure of 3 mmHg. Comparison(s): No significant change from prior study. FINDINGS  Left Ventricle: Left ventricular ejection fraction, by estimation, is <20%. The left ventricle has severely decreased function. The left ventricle demonstrates global hypokinesis. Definity contrast agent was given IV to delineate the left ventricular endocardial borders. The left ventricular internal cavity size was severely dilated. There is mild concentric left ventricular hypertrophy. Left ventricular diastolic parameters are indeterminate. Right Ventricle: The right ventricular size is mildly enlarged. Right ventricular systolic function is severely reduced. There is normal pulmonary artery systolic pressure. The tricuspid regurgitant velocity is 2.74 m/s, and with an assumed right atrial pressure of 3 mmHg, the estimated right ventricular systolic pressure is 41.6 mmHg. Left Atrium: Left atrial size was mildly dilated. Right Atrium: Right atrial size was normal in size. Pericardium: There is no evidence of pericardial  effusion. Mitral Valve: No evidence of mitral valve regurgitation. Tricuspid Valve: Tricuspid valve regurgitation is mild to moderate. Aortic Valve: The aortic valve is tricuspid. Aortic valve regurgitation is not visualized. Aortic regurgitation PHT measures 445 msec. Aortic valve mean gradient measures 1.0 mmHg. Aortic valve peak gradient measures 2.1 mmHg. Aortic valve area, by VTI measures 3.26 cm. Pulmonic Valve: Pulmonic valve regurgitation is not visualized. Aorta: The aortic root is normal in size and structure. Venous: The inferior vena cava is normal in size with greater than 50% respiratory variability, suggesting right atrial pressure of 3 mmHg. IAS/Shunts: No atrial level shunt detected by color flow Doppler. Additional Comments: A device lead is visualized.  LEFT VENTRICLE PLAX 2D LVIDd:         6.00 cm      Diastology LVIDs:         5.90 cm      LV e' medial:    3.15 cm/s LV PW:         1.10 cm      LV E/e' medial:  21.8 LV IVS:        1.10 cm      LV e' lateral:   5.66 cm/s LVOT diam:     2.50 cm      LV E/e' lateral: 12.1 LV SV:         38 LV SV Index:   21 LVOT Area:  4.91 cm  LV Volumes (MOD) LV vol d, MOD A2C: 224.0 ml LV vol d, MOD A4C: 200.0 ml LV vol s, MOD A2C: 212.0 ml LV vol s, MOD A4C: 183.0 ml LV SV MOD A2C:     12.0 ml LV SV MOD A4C:     200.0 ml LV SV MOD BP:      18.2 ml RIGHT VENTRICLE RV Basal diam:  4.10 cm RV Mid diam:    3.70 cm RV S prime:     9.36 cm/s TAPSE (M-mode): 1.2 cm LEFT ATRIUM           Index        RIGHT ATRIUM           Index LA diam:      3.70 cm 2.01 cm/m   RA Area:     13.10 cm LA Vol (A4C): 57.5 ml 31.30 ml/m  RA Volume:   35.10 ml  19.11 ml/m  AORTIC VALVE                    PULMONIC VALVE AV Area (Vmax):    3.84 cm     PV Vmax:       1.51 m/s AV Area (Vmean):   3.47 cm     PV Peak grad:  9.1 mmHg AV Area (VTI):     3.26 cm AV Vmax:           73.30 cm/s AV Vmean:          48.900 cm/s AV VTI:            0.116 m AV Peak Grad:      2.1 mmHg AV Mean  Grad:      1.0 mmHg LVOT Vmax:         57.40 cm/s LVOT Vmean:        34.600 cm/s LVOT VTI:          0.077 m LVOT/AV VTI ratio: 0.66 AI PHT:            445 msec  AORTA Ao Root diam: 3.90 cm MITRAL VALVE               TRICUSPID VALVE MV Area (PHT): 6.12 cm    TR Peak grad:   30.0 mmHg MV Decel Time: 124 msec    TR Vmax:        274.00 cm/s MR Peak grad: 39.4 mmHg MR Vmax:      314.00 cm/s  SHUNTS MV E velocity: 68.60 cm/s  Systemic VTI:  0.08 m MV A velocity: 53.60 cm/s  Systemic Diam: 2.50 cm MV E/A ratio:  1.28 Landscape architect signed by Phineas Inches Signature Date/Time: 10/27/2022/5:32:27 PM    Final    DG CHEST PORT 1 VIEW  Result Date: 10/11/2022 CLINICAL DATA:  Acute respiratory failure EXAM: PORTABLE CHEST 1 VIEW COMPARISON:  09/22/2022 FINDINGS: Single lead pacer is in place. Endotracheal tube tip is just below the thoracic inlet, about 6.5 cm above the carina. Right IJ central line tip: SVC. No pneumothorax. Atherosclerotic calcification of the aortic arch. Mild enlargement of the cardiopericardial silhouette, without overt edema. Low lung volumes are present, causing crowding of the pulmonary vasculature. IMPRESSION: 1. Mild enlargement of the cardiopericardial silhouette, without edema. 2. Low lung volumes are present, causing crowding of the pulmonary vasculature. 3. Endotracheal tube tip is about 6.5 cm above the carina. 4.  Aortic Atherosclerosis (ICD10-I70.0). Electronically Signed   By: Cindra Eves.D.  On: 10/13/2022 16:38   DG ERCP  Result Date: 10/13/2022 CLINICAL DATA:  Biliary ductal dilation EXAM: ERCP COMPARISON:  ERCP, 09/17/2022. CT AP, 09/15/2022. US Abdomen, 10/26/2022. FLUOROSCOPY: Exposure Index (as provided by the fluoroscopic device): 33.9 mGy Kerma FINDINGS: Multiple, limited oblique planar images of the RIGHT upper quadrant obtained C-arm. Images demonstrating flexible endoscopy, biliary duct cannulation, sphincterotomy, retrograde cholangiogram and balloon sweep.  Intra-and extrahepatic biliary ductal dilatation is present. No discrete filling defect is demonstrated. Removal pre-existing plastic biliary stent and placement of metallic stent. IMPRESSION: Fluoroscopic imaging for ERCP and metallic biliary stent placement. For complete description of intra procedural findings, please see performing service dictation. Electronically Signed   By: Michaelle Birks M.D.   On: 10/22/2022 15:08   US Abdomen Limited RUQ (LIVER/GB)  Result Date: 10/17/2022 CLINICAL DATA:  [id number] Biliary obstruction [id number] EXAM: ULTRASOUND ABDOMEN LIMITED RIGHT UPPER QUADRANT COMPARISON:  CT abdomen pelvis 09/15/2022, ERCP 09/17/2022 FINDINGS: Gallbladder: Layering gallbladder sludge within the gallbladder lumen. Avascular masslike echogenic foci along the gallbladder wall measuring 2.4 x 3.2 x 2.3 cm. Possible gallbladder wall thickening. No sonographic Murphy sign noted by sonographer. Common bile duct: Diameter: 18 mm. Echogenic debris noted occluding the common bile duct. Echogenic debris also noted occluding the stent. Liver: Interval development of intrahepatic biliary ductal dilatation. No focal lesion identified. Within normal limits in parenchymal echogenicity. Portal vein is patent on color Doppler imaging with normal direction of blood flow towards the liver. Other: Simple free fluid ascites. IMPRESSION: 1. Gallbladder sludge as well as avascular masslike echogenic foci along the gallbladder wall measuring 2.4 x 3.2 x 2.3 cm-Findings suggestive of inspissated gallbladder sludge. Possible gallbladder wall thickening. Negative sonographic Percell Miller sign reported. Correlate clinically for acute cholecystitis. 2. Interval development of intra and extrahepatic biliary ductal dilatation. Dilated common bile duct. Echogenic debris occluding both the common bile duct and the common bile duct stent. 3. Recommend surgical consultation. 4. Simple free fluid ascites. Electronically Signed   By: Iven Finn M.D.   On: 10/27/2022 20:29   DG Chest 2 View  Result Date: 09/23/2022 CLINICAL DATA:  Recent hospitalization. History of CHF. No new symptoms. EXAM: CHEST - 2 VIEW COMPARISON:  09/07/2019. FINDINGS: Cardiac silhouette normal in size and configuration. Normal mediastinal and hilar contours. Single lead left anterior chest wall AICD is stable in well positioned. Clear lungs.  No pleural effusion or pneumothorax. Skeletal structures are unremarkable. IMPRESSION: No active cardiopulmonary disease. Electronically Signed   By: Lajean Manes M.D.   On: 09/23/2022 12:53   DG ERCP  Result Date: 09/17/2022 CLINICAL DATA:  Biliary ductal dilation. EXAM: ERCP COMPARISON:  CT AP, 09/15/2022.  US Abdomen, 09/15/2022. FLUOROSCOPY: Exposure Index (as provided by the fluoroscopic device): 28.3 mGy Kerma FINDINGS: Multiple, limited oblique planar images of the RIGHT upper quadrant obtained C-arm. Images demonstrating flexible endoscopy, biliary duct cannulation, sphincterotomy, retrograde cholangiogram and plastic biliary stent placement. Intrahepatic biliary ductal dilation is demonstrated. IMPRESSION: Fluoroscopic imaging for ERCP and plastic biliary stent placement. For complete description of intra procedural findings, please see performing service dictation. Electronically Signed   By: Michaelle Birks M.D.   On: 09/17/2022 17:28   DG C-Arm 1-60 Min-No Report  Result Date: 09/17/2022 Fluoroscopy was utilized by the requesting physician.  No radiographic interpretation.   CT ABDOMEN PELVIS WO CONTRAST  Result Date: 09/15/2022 CLINICAL DATA:  Left-sided abdominal pain, diarrhea EXAM: CT ABDOMEN AND PELVIS WITHOUT CONTRAST TECHNIQUE: Multidetector CT imaging of the abdomen and pelvis was performed following  the standard protocol without IV contrast. RADIATION DOSE REDUCTION: This exam was performed according to the departmental dose-optimization program which includes automated exposure control, adjustment  of the mA and/or kV according to patient size and/or use of iterative reconstruction technique. COMPARISON:  Abdomen sonogram done earlier today, CT abdomen done on 11/27/2004. FINDINGS: Lower chest: Pacer leads are noted in place. Visualized lower lung fields are clear. Hepatobiliary: There is abnormal dilation of intrahepatic and extrahepatic bile ducts. Distal common bile duct in the head of the pancreas measures 14 mm. Gallbladder is distended. There is no wall thickening in gallbladder. There is no fluid around the gallbladder. Pancreas: No focal abnormalities are seen in pancreas. Evaluation is limited in this noncontrast study. Spleen: Unremarkable. Adrenals/Urinary Tract: Adrenals are unremarkable. There is no hydronephrosis. Small calcifications are seen in left renal artery branches. There are no demonstrable renal or ureteral stones. Urinary bladder is not distended. Stomach/Bowel: Stomach is not distended. Small bowel loops are not dilated. Appendix is not distinctly seen. There is no pericecal inflammation. There is abnormal wall thickening in sigmoid colon measuring up to 2.4 cm in thickness. Vascular/Lymphatic: There are calcifications in abdominal aorta and its major branches. There is aneurysmal dilation of infrarenal aorta measuring 4.6 x 4.1 cm. Evaluation of lumen is limited in this noncontrast study. There is aneurysmal dilation of right common iliac artery measuring 2.4 cm. There is aneurysmal dilation in left common iliac artery measuring 2.3 cm. There is no demonstrable retroperitoneal hematoma. No significant lymphadenopathy is seen. Reproductive: Prostate is enlarged. Other: There is no ascites or pneumoperitoneum. Umbilical hernia containing fat is seen. Musculoskeletal: Degenerative changes are noted in lumbar spine with disc space narrowing, bony spurs, facet hypertrophy and encroachment of neural foramina at multiple levels, more so at L4-L5 and L5-S1 levels. IMPRESSION: There is no  evidence of intestinal obstruction or pneumoperitoneum. There is no hydronephrosis. There is abnormal dilation of intrahepatic and extrahepatic bile ducts. Distal common bile duct measures 1.4 cm. Evaluation of pancreas in the region of ampulla is limited in this noncontrast study. Findings may be related to noncalcified calculus in the distal common bile duct or stricture in the distal common bile duct. Follow-up MRI and MRCP may be considered. Distention of gallbladder may be due to stricture in distal common bile duct and possibly fasting state of the patient. There is abnormal wall thickening in sigmoid colon. Possibility of malignant neoplastic process in sigmoid colon is not excluded. Endoscopic correlation should be considered. There is aneurysmal dilation of infrarenal abdominal aorta measuring 4.6 cm in diameter. There is aneurysmal dilation of both common iliac arteries measuring up to 2.4 cm. Recommend follow-up every 6 months and vascular consultation.Reference: J Am Coll Radiol 0814;48:185-631. Enlarged prostate.  Lumbar spondylosis. Other findings as described in the body of the report. Electronically Signed   By: Elmer Picker M.D.   On: 09/15/2022 21:45   US Abdomen Limited RUQ (LIVER/GB)  Result Date: 09/15/2022 CLINICAL DATA:  Pain right upper quadrant EXAM: ULTRASOUND ABDOMEN LIMITED RIGHT UPPER QUADRANT COMPARISON:  None Available. FINDINGS: Gallbladder: There are no demonstrable gallbladder stones. Technologist did not observe any tenderness over the gallbladder. There are low-level echoes in the lumen suggesting possible sludge. There is no fluid around the gallbladder. Common bile duct: Diameter: Common bile duct measures 1.5 cm. There is dilation of intrahepatic bile ducts. Distal common bile duct is obscured by bowel gas. Liver: There is increased echogenicity in liver. No focal abnormalities are seen in the visualized portions  of liver. Portal vein is patent on color Doppler  imaging with normal direction of blood flow towards the liver. Other: None. IMPRESSION: Sludge is seen in the lumen of gallbladder. There are no demonstrable gallbladder stones or imaging signs of acute cholecystitis. There is dilation of intrahepatic and visualized proximal extrahepatic bile ducts. Proximal common bile duct measures 1.5 cm in diameter. This may suggest stricture or calculus in the distal common bile duct. Distal common bile duct is obscured by bowel gas and not evaluated. If clinically warranted, follow-up CT may be considered. Electronically Signed   By: Elmer Picker M.D.   On: 09/15/2022 18:28    Microbiology: Recent Results (from the past 240 hour(s))  Culture, blood (Routine X 2) w Reflex to ID Panel     Status: None (Preliminary result)   Collection Time: 10/13/22 10:43 AM   Specimen: BLOOD RIGHT ARM  Result Value Ref Range Status   Specimen Description   Final    BLOOD RIGHT ARM Performed at [facility], [place] [address] [place], [place] [address]    Special Requests   Final    BOTTLES DRAWN AEROBIC AND ANAEROBIC Blood Culture adequate volume Performed at [facility] [address], [place], [place] [address]    Culture   Final    NO GROWTH 2 DAYS Performed at [facility] [address], [place], [place] [address]    Report Status PENDING  Incomplete  Culture, blood (Routine X 2) w Reflex to ID Panel     Status: None (Preliminary result)   Collection Time: 10/13/22 10:49 AM   Specimen: BLOOD RIGHT HAND  Result Value Ref Range Status   Specimen Description   Final    BLOOD RIGHT HAND Performed at [facility], [place] [address], [place], [place] [address]    Special Requests   Final    BOTTLES DRAWN AEROBIC AND ANAEROBIC Blood Culture adequate volume Performed at [facility] [address], [place], [place] [address]    Culture   Final    NO GROWTH 2 DAYS Performed at [facility] [address] [place], [place] [address]    Report Status PENDING  Incomplete  Urine Culture     Status: None   Collection Time: 10/13/22  6:58 PM   Specimen: Urine, Clean Catch  Result Value Ref Range Status   Specimen Description   Final    URINE, CLEAN CATCH Performed at [facility], [place] [address], [place], [place] [address]    Special Requests   Final    Immunocompromised Performed at [facility], [place] [address] [place], [place] [address]    Culture   Final    NO GROWTH Performed at [facility], [place] [address], [place],  [address]    Report Status 10/14/2022 FINAL  Final     Labs: Basic Metabolic Panel: Recent Labs  Lab 10/26/2022 1127 10/10/22 0711 10/11/22 0304 10/12/22 0318 10/13/22 0442 10/14/22 0505 10/14/22 1346  NA 136 137 137 138 138 140 143  K 3.8 3.7 3.1* 4.3 5.0 5.3* 4.0  CL 109 109 110 109 109 108 110  CO2 20* 18* 19* 19* 20* 15* 20*  GLUCOSE 141* 103* 213* 186* 99 172* 162*  BUN '13 18 19 18 23 '$ 40* 47*  CREATININE 0.76 0.85 0.89 0.83 0.94 1.85* 1.97*  CALCIUM 7.5* 8.1* 7.3* 8.1* 8.3* 8.0* 7.8*  MG 1.8  1.8 1.7 2.3 2.3  --   --   PHOS 2.1* 2.7 2.7 2.5 3.0  --   --    Liver Function Tests: Recent Labs  Lab 10/13/2022 1127 10/10/22 0711 10/11/22 0304 10/12/22 0318 10/13/22 0442  AST 55* 53* 34 37 46*  ALT '22 22 16 19 18  '$ ALKPHOS 285* 331* 247* 276* 243*  BILITOT 4.8* 5.4* 4.0* 4.6* 5.1*  PROT 4.1* 5.4* 4.6* 5.3* 5.2*  ALBUMIN 1.8* 2.2* 1.8* 2.1* 2.2*   No results for input(s): "LIPASE", "AMYLASE" in the last 168 hours. No results for input(s): "AMMONIA" in the last 168 hours. CBC: Recent Labs  Lab 09/30/2022 1127 10/10/22 0711 10/11/22 0304 10/12/22 0318 10/13/22 0442 10/13/22 1733 10/14/22 0743  WBC 12.8* 26.5* 22.5* 15.4* 21.9*  --  41.9*  NEUTROABS 10.7* 24.5* 21.0* 14.3* 20.1*  --   --   HGB 9.8* 12.6* 10.7* 11.5* 13.8 11.3* 12.0*  HCT 29.6* 37.6* 33.5* 35.9* 42.6  34.8* 36.4*  MCV 100.7* 98.9 103.4* 101.7* 101.2*  --  98.9  PLT 143* 192 141* 146* 157  --  175   Cardiac Enzymes: No results for input(s): "CKTOTAL", "CKMB", "CKMBINDEX", "TROPONINI" in the last 168 hours. BNP: BNP (last 3 results) Recent Labs    09/17/22 0633 09/18/22 0612 09/19/22 0202  BNP 391.9* 879.6* 656.2*    ProBNP (last 3 results) Recent Labs    09/22/22 1141  PROBNP 835.0*    CBG: Recent Labs  Lab 10/13/22 1138 10/13/22 1614 10/13/22 2205 10/14/22 0750 10/14/22 1132  GLUCAP 62* 120* 97 187* 159*     Signed:  Florencia Reasons MD, PhD, FACP  [facility] October 30, 2022, 9:48 AM

## 2022-10-28 NOTE — Progress Notes (Signed)
Patient's daughter notified that BP is decreasing, SBP in the 80's, agonal breathing with long pauses.

## 2022-10-28 NOTE — Progress Notes (Signed)
Surprisingly, [name] [name] made it through the night.  I stop by last evening.  He has agonal breathing.  He had some of this last night.  He is on a low-dose Dilaudid infusion.  His lactic acid was quite high at 4.8.  His white cell count was 41,000.  I suspect that he probably will pass away today.  He is comfortable.  I do not see any stress with respect to his breathing.  His blood pressure is dropping.  I talked to his family last night.  I explained why I thought it had happened.  I really think that given his incredibly poor cardiac status, his heart just was not going to handle all the different problems that he had.  Having the ischemia in the left hand certainly has not helped.  He has 2 different malignancies.  Again, he is comfortable.  I am just happy that he has been in the ICU.  He has had incredible care in the ICU.  The nurses and staff have been incredibly attentive to him.  I just wish there was more that we could have done for him with respect to his malignancy.  We just were limited because of his poor cardiac output.  He was to try radiation therapy.  However, he was never able to make it to radiation because of the quick decline this week.  I know that he and his family have a strong faith.  They are comforted to know that he will be going to [place].  [name], MD  2 Timothy 4:16-18

## 2022-10-28 DEATH — deceased

## 2022-10-29 ENCOUNTER — Ambulatory Visit: Payer: Medicare Other

## 2023-01-25 ENCOUNTER — Other Ambulatory Visit (HOSPITAL_COMMUNITY): Payer: Self-pay | Admitting: Cardiology

## 2023-01-25 DIAGNOSIS — E785 Hyperlipidemia, unspecified: Secondary | ICD-10-CM
